# Patient Record
Sex: Male | Born: 1945 | Race: Black or African American | Hispanic: No | Marital: Married | Smoking: Former smoker
Health system: Southern US, Community
[De-identification: ages and names within clinical notes are randomized; demographics above are authoritative.]

## PROBLEM LIST (undated history)

## (undated) DIAGNOSIS — I2510 Atherosclerotic heart disease of native coronary artery without angina pectoris: Secondary | ICD-10-CM

## (undated) DIAGNOSIS — I10 Essential (primary) hypertension: Secondary | ICD-10-CM

## (undated) DIAGNOSIS — R591 Generalized enlarged lymph nodes: Secondary | ICD-10-CM

## (undated) DIAGNOSIS — H409 Unspecified glaucoma: Secondary | ICD-10-CM

## (undated) DIAGNOSIS — R599 Enlarged lymph nodes, unspecified: Secondary | ICD-10-CM

## (undated) DIAGNOSIS — N400 Enlarged prostate without lower urinary tract symptoms: Secondary | ICD-10-CM

## (undated) DIAGNOSIS — E119 Type 2 diabetes mellitus without complications: Secondary | ICD-10-CM

## (undated) DIAGNOSIS — J449 Chronic obstructive pulmonary disease, unspecified: Secondary | ICD-10-CM

## (undated) DIAGNOSIS — C801 Malignant (primary) neoplasm, unspecified: Secondary | ICD-10-CM

## (undated) DIAGNOSIS — K219 Gastro-esophageal reflux disease without esophagitis: Secondary | ICD-10-CM

## (undated) DIAGNOSIS — G4730 Sleep apnea, unspecified: Secondary | ICD-10-CM

## (undated) DIAGNOSIS — T7840XA Allergy, unspecified, initial encounter: Secondary | ICD-10-CM

## (undated) DIAGNOSIS — E785 Hyperlipidemia, unspecified: Secondary | ICD-10-CM

## (undated) DIAGNOSIS — I219 Acute myocardial infarction, unspecified: Secondary | ICD-10-CM

## (undated) DIAGNOSIS — Z972 Presence of dental prosthetic device (complete) (partial): Secondary | ICD-10-CM

## (undated) HISTORY — DX: Acute myocardial infarction, unspecified: I21.9

## (undated) HISTORY — DX: Essential (primary) hypertension: I10

## (undated) HISTORY — DX: Type 2 diabetes mellitus without complications: E11.9

## (undated) HISTORY — DX: Atherosclerotic heart disease of native coronary artery without angina pectoris: I25.10

## (undated) HISTORY — DX: Hyperlipidemia, unspecified: E78.5

## (undated) HISTORY — DX: Chronic obstructive pulmonary disease, unspecified: J44.9

## (undated) HISTORY — PX: INGUINAL HERNIA REPAIR: SUR1180

## (undated) HISTORY — PX: MULTIPLE TOOTH EXTRACTIONS: SHX2053

## (undated) HISTORY — PX: COLONOSCOPY W/ BIOPSIES AND POLYPECTOMY: SHX1376

---
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
## 1998-09-27 ENCOUNTER — Encounter: Payer: Self-pay | Admitting: Emergency Medicine

## 1998-09-27 ENCOUNTER — Inpatient Hospital Stay (HOSPITAL_COMMUNITY): Admission: EM | Admit: 1998-09-27 | Discharge: 1998-09-28 | Payer: Self-pay | Admitting: Emergency Medicine

## 1999-03-06 ENCOUNTER — Ambulatory Visit (HOSPITAL_COMMUNITY): Admission: RE | Admit: 1999-03-06 | Discharge: 1999-03-06 | Payer: Self-pay | Admitting: Gastroenterology

## 2000-09-16 HISTORY — PX: CORONARY ANGIOPLASTY: SHX604

## 2000-12-01 ENCOUNTER — Ambulatory Visit (HOSPITAL_COMMUNITY): Admission: RE | Admit: 2000-12-01 | Discharge: 2000-12-01 | Payer: Self-pay | Admitting: Gastroenterology

## 2001-02-15 ENCOUNTER — Encounter: Payer: Self-pay | Admitting: Emergency Medicine

## 2001-02-16 ENCOUNTER — Observation Stay (HOSPITAL_COMMUNITY): Admission: EM | Admit: 2001-02-16 | Discharge: 2001-02-17 | Payer: Self-pay | Admitting: Emergency Medicine

## 2001-11-08 ENCOUNTER — Encounter: Payer: Self-pay | Admitting: Family Medicine

## 2001-11-08 ENCOUNTER — Encounter: Admission: RE | Admit: 2001-11-08 | Discharge: 2001-11-08 | Payer: Self-pay | Admitting: Family Medicine

## 2001-11-15 ENCOUNTER — Encounter: Payer: Self-pay | Admitting: Family Medicine

## 2001-11-15 ENCOUNTER — Encounter: Admission: RE | Admit: 2001-11-15 | Discharge: 2001-11-15 | Payer: Self-pay | Admitting: Family Medicine

## 2001-12-08 ENCOUNTER — Encounter: Payer: Self-pay | Admitting: Neurosurgery

## 2001-12-10 ENCOUNTER — Observation Stay (HOSPITAL_COMMUNITY): Admission: RE | Admit: 2001-12-10 | Discharge: 2001-12-11 | Payer: Self-pay | Admitting: Neurosurgery

## 2001-12-10 ENCOUNTER — Encounter: Payer: Self-pay | Admitting: Neurosurgery

## 2003-01-14 ENCOUNTER — Encounter: Payer: Self-pay | Admitting: Emergency Medicine

## 2003-01-14 ENCOUNTER — Emergency Department (HOSPITAL_COMMUNITY): Admission: EM | Admit: 2003-01-14 | Discharge: 2003-01-14 | Payer: Self-pay

## 2004-01-23 ENCOUNTER — Encounter (INDEPENDENT_AMBULATORY_CARE_PROVIDER_SITE_OTHER): Payer: Self-pay | Admitting: *Deleted

## 2004-01-23 ENCOUNTER — Ambulatory Visit (HOSPITAL_COMMUNITY): Admission: RE | Admit: 2004-01-23 | Discharge: 2004-01-23 | Payer: Self-pay | Admitting: Gastroenterology

## 2005-12-11 ENCOUNTER — Encounter: Admission: RE | Admit: 2005-12-11 | Discharge: 2005-12-11 | Payer: Self-pay | Admitting: Occupational Medicine

## 2006-05-19 HISTORY — PX: OTHER SURGICAL HISTORY: SHX169

## 2006-06-20 ENCOUNTER — Inpatient Hospital Stay (HOSPITAL_COMMUNITY): Admission: EM | Admit: 2006-06-20 | Discharge: 2006-06-23 | Payer: Self-pay | Admitting: Emergency Medicine

## 2006-12-29 ENCOUNTER — Encounter: Admission: RE | Admit: 2006-12-29 | Discharge: 2006-12-29 | Payer: Self-pay | Admitting: Internal Medicine

## 2007-05-20 HISTORY — PX: HYDROCELE EXCISION / REPAIR: SUR1145

## 2007-09-06 ENCOUNTER — Ambulatory Visit (HOSPITAL_COMMUNITY): Admission: RE | Admit: 2007-09-06 | Discharge: 2007-09-06 | Payer: Self-pay | Admitting: General Surgery

## 2007-09-06 ENCOUNTER — Encounter (HOSPITAL_BASED_OUTPATIENT_CLINIC_OR_DEPARTMENT_OTHER): Payer: Self-pay | Admitting: General Surgery

## 2008-11-21 ENCOUNTER — Encounter: Admission: RE | Admit: 2008-11-21 | Discharge: 2008-11-21 | Payer: Self-pay | Admitting: Internal Medicine

## 2009-08-10 ENCOUNTER — Encounter: Admission: RE | Admit: 2009-08-10 | Discharge: 2009-08-10 | Payer: Self-pay | Admitting: Internal Medicine

## 2009-08-13 ENCOUNTER — Encounter: Admission: RE | Admit: 2009-08-13 | Discharge: 2009-08-13 | Payer: Self-pay | Admitting: Internal Medicine

## 2010-06-08 ENCOUNTER — Other Ambulatory Visit: Payer: Self-pay | Admitting: Internal Medicine

## 2010-06-08 DIAGNOSIS — I6529 Occlusion and stenosis of unspecified carotid artery: Secondary | ICD-10-CM

## 2010-08-15 ENCOUNTER — Ambulatory Visit
Admission: RE | Admit: 2010-08-15 | Discharge: 2010-08-15 | Disposition: A | Discharge status: Home / Self Care | Payer: Medicare Other | Source: Ambulatory Visit | Attending: Internal Medicine | Admitting: Internal Medicine

## 2010-08-15 DIAGNOSIS — I6529 Occlusion and stenosis of unspecified carotid artery: Secondary | ICD-10-CM

## 2010-10-01 NOTE — Op Note (Signed)
NAMEBRADON, FESTER NO.:  [id number]   MEDICAL RECORD NO.:  [id number]          PATIENT TYPE:  AMB   LOCATION:  DAY                          FACILITY:  [facility]   PHYSICIAN:  Leonie Man, M.D.   DATE OF BIRTH:  [birth year]   DATE OF PROCEDURE:  09/06/2007  DATE OF DISCHARGE:                               OPERATIVE REPORT   PREOPERATIVE DIAGNOSES:  Bilateral inguinal hernias with bilateral  hydroceles.   POSTOPERATIVE DIAGNOSES:  Bilateral inguinal hernias with bilateral  hydroceles.   PROCEDURE:  Bilateral inguinal herniorrhaphies with mesh and the  bilateral hydrocelectomy.   SURGEONS:  Leonie Man, M.D. and Lindaann Slough, M.D., surgical  team.   ANESTHESIA:  General.   NOTE:  The patient is a 65 year old retired man with a history of  coronary artery disease who underwent drug-eluting stenting in February  2008.  He delayed elective repair of his inguinal hernia and hydroceles  due to his Plavix and aspirin therapy.  He has had a recent evaluation  by Dr. Alanda Amass and has had a Cardiolite study.  He comes now to the  operating room for surgery.  The patient is well aware of the risks and  potential benefits of surgery and gives his consent to same.   PROCEDURE:  The patient is positioned supinely.  Following the induction  of satisfactory general anesthesia, the abdomen and scrotum and penis  are prepped and draped to be included in a sterile operative field.  Positive identification of the patient as Trevor Bailey and the procedure  to be done as bilateral inguinal hernias as well as bilateral  hydrocelectomies were verified.   I made an incision into the left lower abdominal crease, deepening this  through the skin and subcutaneous tissue, dissecting down the external  oblique aponeurosis.  This was opened up to the external inguinal ring  with elevation of the spermatic cord which was held with a Penrose  drain.  Dissecting along the  anteromedial aspect of the cord a large  indirect hernial sac was located.  This was dissected free and carried  up to the internal ring.  Upon opening the sac, this was noted to be  sliding hernia containing a portion of the sigmoid colon.  The portion  of sigmoid colon was dissected free and returned into the peritoneal  cavity and the sac was then closed with a running 3-0 Vicryl suture.  I  then imbricated the large direct hernia with a running suture of 2-0  Vicryl and then an onlay patch of polypropylene mesh was sewn in at the  pubic tubercle, carried up along the conjoined tendon with a 2-0 Novofil  suture up to the internal ring, again from the pubic tubercle up along  the shelving edge of Poupart's ligament up to the internal ring.  The  mesh is split so as to allow [place] normal emanation of the cord.  The tails  of the mesh were sewn down behind the cord with a 2-0 Novofil suture.  All areas of the dissection then checked for checked for hemostasis and  noted to be dry.  Sponge and instrument counts were verified and the  external oblique aponeurosis closed with a running suture of 2-0 Vicryl.  Scarpa fascia closed with a running suture of 3-0 Vicryl and the skin  closed with running 4-0 Monocryl suture.   At this point Dr. Brunilda Payor proceeded to do bilateral hydrocelectomies.  This  will be dictated in separate operative notes.   I then turned my attention to the right inguinal hernia inguinal region  after the changing of gloves at this point.  Again a transverse incision  in the lower abdominal crease was deepened through the skin and  subcutaneous tissue, dissecting down to the external oblique aponeurosis  on the right side.  Then the external oblique aponeurosis opened up  through the external inguinal ring on the right side.  The spermatic  cord was elevated.  There was no evidence of an indirect sac.  There  was, however, a very large direct inguinal hernia on this side.  The  was  repaired in the same fashion by reducing the sac with an imbricating  suture of 2-0 Vicryl followed by an onlay patch of polypropylene mesh  which was sewn in at the pubic tubercle, carried up along the conjoined  tendon with a running suture of 2-0 Novofil and again from the pubic  tubercle up along the shelving edge of Poupart's ligament to the  internal ring with mesh being split so as to allow normal emanation of  the spermatic cord.  The tails of the mesh were  sutured down at the  internal oblique muscle behind the cord.  Again all areas of dissection  were checked for hemostasis and noted to be dry.  Sponge and instrument  counts verified and the external oblique aponeurosis then closed over  the spermatic cord with a running 2-0 Vicryl suture, thus  reapproximating the external ring.  The Scarpa fascia was closed with 3-  0 Vicryl and the skin closed with 4-0 Monocryl suture running  subcuticulars.  Sterile dressings were then applied to the groin wounds  as well as collodion dressing on the scrotal wounds.  The anesthetic  reversed.  The patient removed from the operating room to the recovery  room in stable condition.  He tolerated the procedure well.      Leonie Man, M.D.  Electronically Signed     PB/MEDQ  D:  09/06/2007  T:  09/06/2007  Job:  [id number]   cc:   Leonie Man, M.D.  [address]  [address]  [place]  [place] [address]   Lindaann Slough, M.D.  Fax: [phone]   Mosetta Putt, M.D.  Fax: [phone]

## 2010-10-01 NOTE — Op Note (Signed)
NAMECHARLESTON, Trevor Bailey                ACCOUNT NO.:  [id number]   MEDICAL RECORD NO.:  [id number]          PATIENT TYPE:  AMB   LOCATION:  DAY                          FACILITY:  [facility]   PHYSICIAN:  Lindaann Slough, M.D.  DATE OF BIRTH:  19-Feb-1946   DATE OF PROCEDURE:  09/06/2007  DATE OF DISCHARGE:                               OPERATIVE REPORT   PREOPERATIVE DIAGNOSES:  1. Bilateral hydrocele.  2. Bilateral inguinal hernia.   PROCEDURE DONE:  1. Bilateral hydrocelectomy, excision of appendix testis and      epididymis, excision of bilateral epididymal cysts by Dr. Brunilda Payor.  2. Bilateral inguinal hernia repair by Dr. Lurene Shadow.   INDICATION:  The patient is a 65 year old male who had been complaining  of swelling of the left groin on and off for about a year.  He also had  been having swelling of the scrotum.  On examination, he was found to  have bilateral hydrocele and bilateral inguinal hernias.  He is  scheduled today for bilateral inguinal hernia repair and bilateral  hydrocelectomy.   Under general anesthesia, the patient was prepped and draped and placed  in the supine position.  The inguinal hernia repairs were performed by  Dr. Lurene Shadow.  For specifics, see his dictation.   A longitudinal incision was made on the left scrotum.  The incision was  carried down to the tunica vaginalis which was then incised.  About 200  mL of clear fluid was drained out from the hydrocele sac.  The appendix  testis and appendix epididymis were excised.  There was a 1-cm cyst at  the head of the left epididymis.  The cyst was excised.  Hemostasis was  secured with electrocautery.  Then the edges of the incision were  approximated with #3-0 chromic.  Then the tunica vaginalis was  imbricated with 3-0 chromic using the Lord's technique.  Then the  scrotum was closed in two layers with #3-0 chromic.  Then an incision  was made on the right scrotum.  The incision was carried down to the  tunica  vaginalis which was then incised.  The right hydrocele was  slightly smaller than the left.  One-hundred and fifty mL of clear fluid  were drained out of the hydrocele sac.  The appendix testis and appendix  epididymis were excised.  There was also a cyst at the head of the right  epididymis that was also excised.  Hemostasis was secured with  electrocautery.  The edges of the epididymal cyst excision were then  approximated with #3-0 chromic.  The tunica vaginalis was imbricated  with #3-0 chromic using the Lord's technique.  Then the incision was  closed with #3-0 chromic in two layers.   The patient tolerated the procedure well and left the OR in satisfactory  condition to post anesthesia care unit.      Lindaann Slough, M.D.  Electronically Signed    MN/MEDQ  D:  09/06/2007  T:  09/06/2007  Job:  [id number]

## 2010-10-04 NOTE — Op Note (Signed)
[facility]  Patient:    Trevor Bailey, Trevor Bailey Visit Number: [id number] MRN: [id number]          Service Type: SUR Location: 3000 [id number] Attending Physician:  Josie Saunders Dictated by:   Danae Orleans Venetia Maxon, M.D. Proc. Date: 12/10/01 Admit Date:  12/10/2001 Discharge Date: 12/11/2001                             Operative Report  PREOPERATIVE DIAGNOSIS:  Bilateral disk herniation at L4-5, left with spondylosis stenosis, degenerative disk disease, and radiculopathy.  POSTOPERATIVE DIAGNOSIS:  Bilateral disk herniation at L4-5, left with spondylosis stenosis, degenerative disk disease, and radiculopathy.  PROCEDURE:  Bilateral microdiskectomy at L4-5 on the left with microdissection.  SURGEON:  Danae Orleans. Venetia Maxon, M.D.  ASSISTANT:  Clydene Fake, M.D.  ANESTHESIA:  General endotracheal anesthesia.  ESTIMATED BLOOD LOSS:  Minimal.  COMPLICATIONS:  None.  DISPOSITION:  To recovery.  INDICATION:  Trevor Bailey is a 65 year old man with a large bilateral disk herniation at L4-5 on the left with significant L4 radiculopathy with progressive weakness.  It was elected to take him to surgery for a lateral microdiskectomy.  PROCEDURE:  Trevor Bailey was brought to the operating room.  Following the satisfactory uncomplicated induction of general endotracheal anesthesia, the patient was in the prone position on the Wilson frame.  His low back was prepped and draped in the usual sterile fashion.  AlL soft tissue and bony prominences were padded prior to prepping.  Planned incision was infiltrated with 0.25% Marcaine and 0.5% lidocaine with 1:200,000 epinephrine.  An incision was made overlying the L4 and L5 spinous processes and carried through the subcutaneous fat and the lumbodorsal fascia was incised in the left at the midline.  Subperiosteal dissection was performed exposing the L4-5 facet joint and subsequently the L4 and L5 transverse processes  on the left. An intraoperative x-ray confirmed orientation of the L4 transverse process. The microscope was brought into the field and using microdissection technique with a drill with the blue #8 bit, the inferior aspect of a very broad L4 transverse process and the pars interarticularis were thinned with the drill along with a protuberant degenerated facet joint at the superior facet of L5. After the bone removal was completed with a Kerrison rongeur, the intertransverse membrane and ligament were removed with Kerrison rongeur exposing the L4 nerve root in a far lateral position.  It appeared to be tinted cephalad by a significant disk herniation.  Using microdissection technique, the disk herniation was dissected free from the nerve root and the disk herniation was then entered with a micronerve hook.   A number of fragments of disk material were removed both medially and laterally, and using a variety of pituitary rongeurs, micropituitaries and Epstein curets.  The interspace was entered with upbiting pituitary and medial disk material was also removed.  Subsequently, a coronary dilator could easily be inserted out along the nerve root as it exited out of the spine and also more medially along its course.  It was not felt to be significantly compressed at this point.  Hemostasis was assured with bone wax along the bony edges and the wound was copiously irrigated with bacitracin and saline.  The self-retaining retractor was removed, microscope was taken out of the field, the lumbodorsal fascia was closed with 0-Vicryl sutures.  The subcutaneous tissue was reapproximated with 2-0 Vicryl interrupted, inverted sutures, and the skin  edges were reapproximated with interrupted 3-0 Vicryl subcuticular stitch. The wound was dressed with Dermabond.  The patient tolerated the procedure well and was taken to recovery in stable and satisfactory condition.  Counts were correct at the end of the  case. Dictated by:   Danae Orleans Venetia Maxon, M.D. Attending Physician:  Josie Saunders DD:  12/10/01 TD:  12/13/01 Job: [id number]

## 2010-10-04 NOTE — Discharge Summary (Signed)
Trevor Bailey, Trevor Bailey NO.:  [id number]   MEDICAL RECORD NO.:  [id number]          PATIENT TYPE:  INP   LOCATION:  6529                         FACILITY:  [facility]   PHYSICIAN:  Madaline Savage, M.D.DATE OF BIRTH:  07/19/1945   DATE OF ADMISSION:  06/19/2006  DATE OF DISCHARGE:  06/23/2006                               DISCHARGE SUMMARY   [name] is a 65 year old African-American male patient with  prior history of coronary artery disease.  He is status post an MI in  18.  He had a PCI in May of 2000 and progression of disease and he  underwent a PCI of his RCA.  He came into the emergency room with  complaints of chest pain.  He was admitted and put on IV heparin.  He  ruled out for an MI.  He underwent cardiac catheterization performed by  Dr. Chanda Busing on June 22, 2006, which revealed 75% in-stent  restenosis of diagonal stent.  He also had high grade blockage in his  proximal RCA.  His EF was 60%.  He had patent stent in zone 1.  Thus, he  underwent stenting with a Cypher DES stent to his proximal RCA.  His  residual diagonal and distal RCA disease was decided to treat medically.  His distal RCA was a 2.0-mm vessel.  Smoking cessation consult was  obtained.  The patient had actually quit smoking a week before his  admission.  He stated he would continue his smoking cessation plan.  He  was seen by Dr. Elsie Lincoln on June 23, 2006, considered stable for  discharge home.  His blood pressure was 140/73, heart rate 61,  respirations 16.  His hemoglobin was 13.6, hematocrit 40.5, platelets  189, WBC 3800, sodium 141, potassium 4.1, BUN 9, creatinine 0.87, and  glucose was 100.  He had a CT of his chest, which showed no pulmonary  embolism.   OTHER LABS:  His AST was 29, ALT was 43.  CK-MB and troponin were all  negative.  His total cholesterol is 128, triglycerides 89, HDL was 50,  LDL was 60.  TSH was 0.694.   DISCHARGE MEDICATIONS:  1.  Aspirin 325 mg a day.  2. Lipitor 80 mg a day.  3. Zetia 10 mg a day.  4. Flovent one puff a day.  5. Uroxatral 10 mg every day.  6. Altace 10 mg every day.  7. Toprol-XL 50 mg every day.  8. Colace 200 mg a day.  9. Protonix 40 mg once a day.  10.Prilosec 20 mg once a day.  11.Plavix 75 mg once a day.  He was told not to stop it.  12.Albuterol MDI use before nitroglycerin one 150 p.r.n. for chest      pain.   DISCHARGE DIAGNOSES:  1. Unstable angina.  2. Status post cardiac catheterization with progression of coronary      artery disease, and now status post stenting of a proximal right      coronary artery lesion.  He did have 75% in-stent restenosis of a  diagonal, which was a very small vessel, 30% proximal left anterior      descending, 60% lesion in his distal right coronary artery which      was also a 2.0-mm vessel distally.  These will be treated      medically.  3. Normal ejection fraction of 60%.  4. Hypertension.  5. Tobacco smoking.  He has now quit.  6. Chronic obstructive pulmonary disease, on inhalers.  7. Urinary dysfunction, on Uroxatral.  8. Hyperlipidemia.      [name], N.P.    ______________________________  Madaline Savage, M.D.    BB/MEDQ  D:  08/04/2006  T:  08/04/2006  Job:  [id number]   cc:   Mosetta Putt, M.D.  Richard A. Alanda Amass, M.D.

## 2010-10-04 NOTE — Cardiovascular Report (Signed)
[name], Bailey NO.:  [id number]   MEDICAL RECORD NO.:  [id number]          PATIENT TYPE:  INP   LOCATION:  6529                         FACILITY:  [facility]   PHYSICIAN:  Madaline Savage, M.D.DATE OF BIRTH:  [birth year]   DATE OF PROCEDURE:  06/22/2006  DATE OF DISCHARGE:                            CARDIAC CATHETERIZATION   PROCEDURES PERFORMED:  1. Selective coronary angiography by Judkins technique.  2. Retrograde left heart catheterization.  3. Left ventricular angiography.  4. Balloon angioplasty of the proximal right coronary artery.  5. Intracoronary artery stenting of the proximal right coronary      artery.  6. Placement of right percutaneous femoral artery Angio-Seal for      arteriotomy closure.   COMPLICATIONS:  None.   ENTRY SITE:  Right femoral.   DYE USED:  Omnipaque.   PATIENT PROFILE:  [name] is a very pleasant 65 year old African  American gentleman who is a medical patient of Susa Griffins, MD,  who has had previous intracoronary artery stenting to the diagonal  branch of his LAD and also to a continuation branch of his obtuse  marginal branch of the left circumflex.  He presented at this time with  chest pain and had improvement of that chest pain with nitroglycerin.  He had negative cardiac enzymes.  His EKG showed sinus rhythm and sinus  bradycardia with not much in the way of ischemic change.  The patient  was treated medically over the weekend and was brought to the  catheterization lab today for elective cardiac catheterization which was  accomplished via the right percutaneous femoral approach without  complications.   RESULTS:   PRESSURES:  The patient's left ventricular pressure was 115/90, end-  diastolic pressure 16.  The central aortic pressure was 120/80 with a  mean of 98. No aortic valve gradient by pullback technique.   ANGIOGRAPHIC RESULTS:  The patient's left main coronary artery was a  very large  vessel, fairly short in length and normal.   The left anterior descending coronary artery coursed to the cardiac apex  and gave rise to several diagonal branches and gave retrograde flow  through distal portions of the LAD to the RCA.  There is an area of  about 40% narrowing of the proximal LAD before any diagonal branches  arise.  There is a diagonal branch off the LAD which is very, very small  and has a radio-opaque stent in it, and there is in-stent restenosis in  this vessel, but is a very small vessel, probably not worthy of attempts  to reopen it.  LAD looks very good mid and distal.   Circumflex coronary artery contains a radio-opaque stent in the proximal  portions of a large obtuse marginal branch.  This obtuse marginal branch  has secondary and tertiary branches, and they appear open. The ongoing  circumflex is fairly small and patent.   The right coronary artery shows a 90% area of stenosis just beyond a  pulmonary conus branch and the sinus node branch. It is concentric. The  length of the lesion is approaching 12-14  mm in length.  There are nicks  and kinks throughout the distal portion of this vessel. That portion of  the vessel is about 2 mm in diameter.  There are two or three 60% areas  of narrowing. None of these appear to require intervention on this day.   Left ventricle shows normal contractility with ejection fraction  estimation of 60-70%.  No wall motion abnormalities and no mitral  regurgitation.   Percutaneous intervention of the proximal RCA was accomplished  uneventfully without complications.  It was performed with a Cordis  right Judkins guide catheter that was a simple Judkins right guide. A  Prowater wire was used. A predilatation balloon was used which was a  Voyager 2.5 mm.  I was then able to cross the stenotic lesion with ease  with a 2.5 Cypher stent that was 18 mm long. I then postdilated with a  Quantum Maverick 2.75 balloon. The result was  excellent, and the 90%  stenosis was reduced to 0% residual.  We did not lose any distal  branches.   FINAL DIAGNOSES:  1. Recent unstable angina without myocardial infarction in a patient      with multivessel disease and chronic tobacco use.  2. Successful intracoronary artery stenting of the proximal right      coronary artery with 90% reduced to 0% proximal vessel.   PLAN:  The patient will have a smoking cessation consultation.  He was  loaded with Plavix in the catheterization lab and will have that process  continued hereafter, and he will be a candidate for discharge in  upcoming days.           ______________________________  Madaline Savage, M.D.     WHG/MEDQ  D:  06/22/2006  T:  06/22/2006  Job:  [id number]   cc:   Gerlene Burdock A. Alanda Amass, M.D.  [facility]

## 2010-10-04 NOTE — Op Note (Signed)
Trevor Bailey. [facility]  Patient:    Trevor Bailey, Trevor Bailey Visit Number: [id number] MRN: [id number]          Service Type: OBV Location: 3700 3713 [id number] Attending Physician:  Ruta Hinds Dictated by:   Pearletha Furl Alanda Amass, M.D. Proc. Date: 02/16/01 Admit Date:  02/15/2001   CC:         Carolyne Fiscal, M.D.  Dr. Tresa Endo  CP Lab   Operative Report  PROCEDURE: 1. Retrograde central aortic catheterization. 2. Selective right coronary angiography. 3. Preoperative and postoperative nitroglycerin administration. 4. Percutaneous transluminal coronary angioplasty, high grade mid right    coronary artery stenosis in the setting of acute coronary syndrome without    myocardial infarction. 5. Left ______ stenting.  Proximal-mid right coronary artery. 6. Aggrastat bolus plus infusion, 300 Plavix p.o. in the laboratory, weight    adjusted heparin 4500 units.  The above PCI was done through the previously placed 6 French short Deg side arm sheath.  Please see preceding catheterization report of 02/16/01 for details.  INDICATIONS FOR PROCEDURE:  This 65 year old African-American gentleman is still a smoker, has known coronary disease, with prior PCI 09/27/98, was admitted with new onset unstable angina without myocardial infarction. Diagnostic studies showed progression of disease of the proximal-mid right coronary artery, felt to be the culprit lesion.  Associated disease in his mid circumflex that were noncritical, and patent stents in the mid DX1 and mid CX from 09/27/98.  DESCRIPTION OF PROCEDURE:  The right coronary artery was intubated initially with a JR4 guiding catheter, and lesion was crossed with an HTF wire.  Because of a marked bend in the right coronary artery beyond the acute margin, we were not able to get the HTF pass that, and it kept pushing the guide out, so the system was changed to a hockey stick 6 [place] guiding catheter and a choice  PT 0.014 inch Seimed guide wire.  We were able to get the choice PT wire across the lesion into the distal right coronary artery.  The guiding catheter provided fairly good backup.  The lesion was predilated with a 2.5/15 cross sail ACS balloon at 8-51.  There was residual stenosis and elastic recoil, so the system was exchanged for a 3.0/16 simetics rest II balloon expandable stent.  We were able to cross the stenosis with this, position this fluoroscopically to cover the lesion, and inflate at 13-39, and redilated to 15 atmospheres for 30 seconds.  Nitroglycerin 200 mcg was given after the balloon was pulled back.  Stenosis reduction was to 0% to -10% within the lesion with good TIMI III flow.  The mid RV branch had been previously occluded in the past and proximal to this.  There was 40 to 50% smooth tandem lesions in the distal right coronary artery beyond the acute margin with good flow to the posterior descending coronary artery and PLA which was improved post procedure.  There was residual 40% smooth narrowing of the right coronary artery proximal to the stent that was not dilated with good residual lumen. The dilatation system was removed.  Side arm sheath was flushed and secured to the skin with #1 silk suture to prevent migration.  The patient was transferred to the holding area for postoperative care.  He was given heparin 4500 units in the procedure, and Aggrastat bolus plus infusion 300 mg of Plavix.  Recommended continuing medical therapy, Aggrastat for 18 hours, continue Plavix for at least a month, possible long-term in  this patient. Discontinuation of smoking, and continued medical therapy of his associated problems as outlined.  DISCHARGE DIAGNOSES: 1. Successful percutaneous transluminal coronary angioplasty and stent, high    grade proximal-mid right coronary artery stenosis. 2. No restenosis mid circumflex and mid DX1 lesions from 09/27/98. 3. Moderate disease, ostial  DX1, proximal-mid CX, and PABJ branch of CX    unchanged. 4. Segmental wall motion abnormality with well preserved left ventricular    function, ejection fraction 55%. 5. Systemic hypertension. 6. Hyperlipidemia. 7. Chronic obstructive pulmonary disease, continued cigarette abuse.Dictated by:   Pearletha Furl Alanda Amass, M.D. Attending Physician:  Ruta Hinds DD:  02/16/01 TD:  02/17/01 Job: [id number]

## 2010-10-04 NOTE — Discharge Summary (Signed)
[place]. [facility]  Patient:    Trevor Bailey, Trevor Bailey Visit Number: [id number] MRN: [id number]          Service Type: OBV Location: 3700 3713 01 Attending Physician:  Ruta Hinds Dictated by:   Marya Fossa, P.A. Admit Date:  02/15/2001 Discharge Date: 02/17/2001   CC:         Carolyne Fiscal, M.D.   Discharge Summary  DATE OF BIRTH:  1946/05/12  [facility] RECORD NUMBER:  [id number].  ADMISSION DIAGNOSES: 1. Unstable angina, rule out myocardial infarction. 2. Known coronary artery disease. 3. Tobacco abuse. 4. Hypertension. 5. Hyperlipidemia. 6. Seasonal allergies.  DISCHARGE DIAGNOSES: 1. Unstable angina resolved, myocardial infarction ruled out with negative    enzymes, status post cardiac catheterization with intervention to the right    coronary artery. 2. Known coronary artery disease. 3. Tobacco abuse. 4. Hypertension. 5. Hyperlipidemia. 6. Seasonal allergies.  HISTORY OF PRESENT ILLNESS:  The patient is a 65 year old African-American male with known coronary disease who experienced the sudden onset of substernal chest pain while sitting in a break room at work.  There was no radiation.  It was relieved after three sublingual nitroglycerin.  The pain returned around 8:30 with soreness, fullness, and pressure in the chest.  He did not take nitroglycerin.  He went directly to the emergency room.  This was relieved initially with a GI cocktail.  He continued to have fullness through this morning.  He states this is the first episode of chest pain he has had since 2000.  This is similar to his prior symptoms, but not as severe.  His cardiac history is significant for MI in 1996.  His last cath in May 2000 revealed a normal left main, LAD with diffuse nonobstructive disease. Diagonal #1 with a 95% proximal lesion which was intervened upon.  RCA with a 50-60% proximal nonobstructive lesion, and EF  of 60%.  The circumflex had a 70-80% mid lesion which was intervened upon.  He had a negative Cardiolite in April of 2001.  In the emergency room, the patient is pain-free.  EKG non-acute.  The patient to be admitted for chest pain, rule out MI.  He is at risk for progression of disease.  The patient was placed on heparin per pharmacy protocol and sublingual nitroglycerin as he is pain-free.  Plan cardiac catheterization. Will hold off on Plavix therapy until the patient has catheter.  The patient is agreeable to this approach.  PROCEDURES:  Cardiac catheterization on February 16, 2001, by Dr. Susa Griffins with intervention to the RCA.  COMPLICATIONS:  None.  CONSULTATIONS:  None.  HOSPITAL COURSE:  The patient was admitted to [facility] in the wee hours of February 16, 2001.  He was seen in the morning by our service and felt that the patient needed cardiac catheterization.  He had already been placed on IV heparin.  Nitroglycerin as needed.  The patient remained chest pain free since admission to the hospital.  Admitting labs revealed a WBC of 6.4, hemoglobin 14.9, and platelets 227.  INR 1.1.  Sodium 137, potassium 3.3, BUN 13, creatinine 0.9.  The potassium was repleted with supplementation.  Repeat BMP on February 25, 2001, showed a potassium of 3.8, BUN 8, creatinine 0.8.  Cardiac enzymes were negative x 3.  The patient was taken to the cardiac cath lab by Dr. Alanda Amass on February 16, 2001.  This revealed a normal left main, less than 20%  diffusely irregular proximal LAD, less than 30% InStent restenosis of the diagonal #1 stent, and a 50-60% ostial diagonal #1 lesion.  The circumflex had diffuse irregularities with a patent stent in its mid distal portion.  This had less tan 20% InStent restenosis.  PABG branch had a 60% lesion.  The RCA had a 40% smooth lesion proximally and an 85% high grade mid lesion.  Distal vessel had a 40% lesion. Dr. Alanda Amass proceeded with  intervention to the mid RCA, reducing an 85% lesion to less than 10%.  The patient tolerated the procedure well.  Aggrastat was bolused and infused.  There was no problem with sheath pull.  The patient remained stable overnight.  Labs remained stable as mentioned above.  The patients groin remained stable.  There was no bruit or hematoma. The patient was felt stable for discharge.  He has been counseled on smoking cessation.  DISCHARGE MEDICATIONS:  1. Lipitor 80 mg a day.  2. Serevent two puffs as directed.  3. Enteric-coated aspirin a day.  4. Plavix 75 mg a day.  5. Wellbutrin 150 mg twice a day.  6. ______ one a day.  7. Toprol XL 50 mg a day.  8. Nitroglycerin as needed for chest pain.  9. Altace 5 mg a day. 10. Flonase and Allegra as before.  DISCHARGE ACTIVITY:  No strenuous activity, lift more than 5 pounds, or driving for two days.  DISCHARGE DIET:  A low fat, low cholesterol, and low salt diet.  DISCHARGE INSTRUCTIONS:  The patient is asked to stop smoking.  The patient is to call the office for any problems or questions.  FOLLOWUP:  A follow up appointment has been make with Abelino Derrick, P.A.-C., for March 08, 2001, at 10:00 in the morning. Dictated by:   Marya Fossa, P.A. Attending Physician:  Ruta Hinds DD:  02/17/01 TD:  02/17/01 Job: [phone] [id number]

## 2010-10-04 NOTE — Cardiovascular Report (Signed)
[place]. [facility]  Patient:    SIDDH, VANDEVENTER Visit Number: [id number] MRN: [id number]          Service Type: OBV Location: 3700 3713 01 Attending Physician:  Ruta Hinds Dictated by:   Pearletha Furl Alanda Amass, M.D. Proc. Date: 02/16/01 Admit Date:  02/15/2001 Discharge Date: 02/17/2001   CC:         Carolyne Fiscal, M.D.  CP Lab  Lennette Bihari, M.D.   Cardiac Catheterization  PROCEDURES: 1. Retrograde central aortic catheterization with selective coronary    angiography, Judkins technique, pre and post IC nitroglycerin    administration. 2. Left ventriculogram in the RAO and LAO projection, subselective LIMA and    RIMA, abdominal aortic angiogram, midstream PA projection.  DESCRIPTION OF PROCEDURE:  The patient was brought to the second floor CP lab in a post-absorptive state after 5 mg of Valium p.o. premedication.  The right groin was prepped and draped in the usual manner.  One percent Xylocaine was used for local anesthesia.  The RFA was entered with a single anterior puncture using an 18 thin wall needle and a 6-French short Daig side arm sheath was inserted without difficulty.  Catheterization was done with a 6-French 4 cm taper preformed Cordis pigtail and coronary catheter using Omnipaque dye throughout the procedure.  The patient was given 2 mg of Nubain for sedation during the procedure.  Following coronary angiography, subselective LIMA and RIMA done with the right coronary catheter.  This demonstrated patent RIMA, patent LIMA, patent brachiocephalic, and patent subclavian with antegrade vertebral flow bilaterally.  LV angiogram was done in the RAO and LAO projection at 25 cc and 14 cc per second, 20 cc and 12 cc per second respectively.  Pull back pressure of the CA showed no gradient across the aortic valve.  The catheter was removed and side arm sheath was flushed, pending review of the patients cineangiogram.   She tolerated the diagnostic procedure well.  PRESSURES:  LV 120/0, LVEP 18-20 mmHg, CA 120/70 mmHg.  There is no gradient across the aortic valve on catheter pull back.  FLUOROSCOPIC DATA:  Fluoroscopy revealed 2+ coronary calcification.  There is no intracardiac or valvular calcification.  There was coronary calcification of the right and LAD system.  Previously placed stents in the diagonal (DX1) and mid circumflex were well-visualized fluoroscopy.  LEFT VENTRICULOGRAM:  LV angiogram demonstrated hypokinesis of the mid anterolateral wall and hypokinesis of the inferior base and hypo-akinesis of the inferior basilar wall in the RAO projection.  LV was normal.  In the LAO projection, there appeared to be angiographic LVH.  There was no mitral regurgitation present.  ABDOMINAL AORTOGRAM:  Midstream PA projection revealed patent celiac and SMA axis.  Single patent renals bilaterally with no significant stenosis and only minimal infrarenal atherosclerotic disease with mild iliac tortuosity and good runoff.  CORONARY ANGIOGRAM: 1. The main left coronary artery was normal. 2. The left anterior descending artery had irregularities in the proximal    third with less than 20% narrowing.  There was good TIMI-3 flow to the    apex, and there was a large LAD. 3. The first diagonal branch had 50-60% ostial and proximal narrowing with    good flow, and a previously placed stent in the proximal - mid diagonal,    and had less than 20-30% narrowing (Sep 27, 1998); 2.5/18 plus 2.58 tandem    Tri-Star ACS stent.  The second diagonal had no significant stenosis  with    small erosions in the mid LAD. 4. The circumflex artery had irregularity in the proximal third with PABG    branch from the junction of the proximal mid third.  Just beyond the first    PABG branch was 50-60% narrowing of the circumflex before the second PABG    branch.  The second PABG branch had 60-70% narrowing at its ostia with  good    flow, and this was slightly improved from his prior angiogram.  The    previously placed stent in the mid circumflex (ABE 3.0/12) showed less than    10-20% residual stenosis, wide lumen with no significant intimal    thickening, and good flow to the large distal marginal branch and distal    bifurcating circumflex. 5. The right coronary artery was a dominant vessel.  There was diffuse disease    from the junction of the proximal third to the midportion of approximately    70-75%, and there was focal 85% stenosis in the midportion of the lesion    before the mid RCA.  The RV branch arising from the mid RCA was totally    occluded with only some faint antegrade LAD filling, and this was an old    finding.  There was 40-50% narrowing of the distal RCA that was smooth    beyond the acute margin.  There was a small PDA and PLA branch without    significant stenosis.  DISCUSSION:  The patient is a very pleasant 39 year old married father of one with three grandchildren.  He has known coronary disease and had prior intervention on Sep 27, 1998, with stenting of his first diagonal and mid circumflex (see above).  He has done well on medical therapy including Lipitor for hyperlipidemia, but unfortunately, continues to smoke a half-pack of cigarettes per day.  He is still working full-time as a Chartered certified accountant at [employer].  He was admitted on this occasion with ischemic chest pain of two days duration with negative initial enzymes and without acute EKG changes. He was felt to be high risk for progressive disease of the right coronary artery assessed by diagnostic catheterization.  Catheterization demonstrates progression of disease, particularly in the RCA. No restenosis of mid circumflex and no restenosis of the DX1 with associated disease as outlined.  There is an borderline lesion  of the circumflex  proximal to the previous placed stent which does not appear to be critical. He is a  candidate for PCI for progression of his right coronary disease in this setting with return unstable angina without myocardial infarction.  We plan to do PCI after reviewing his angiograms.  If this is successful, I would recommend follow up with Cardiolite and clinical follow up of circumflex disease long-term.  CATHETERIZATION DIAGNOSES:  1. Arteriosclerotic heart disease - unstable angina.  2. Progression of disease, mid right coronary artery.  3. No restenosis, mid circumflex.  4. No restenosis, diagonal #1.  5. Associated ostial diagonal #1, ostial posterolateral branch and mid     circumflex disease as outlined above.  6. Segmental wall motion abnormality anterolateral and inferobasal (old right     ventricular acute marginal branch occlusion).  7. Systemic hypertension.  8. Well-preserved left ventricular function with ejection fraction of     approximately 55% and left ventricular hypertrophy.  9. Hyperlipidemia. 10. Chronic obstructive pulmonary disease, continued cigarette abuse. Dictated by:   Pearletha Furl Alanda Amass, M.D. Attending Physician:  Ruta Hinds DD:  02/16/01 TD:  02/17/01 Job: [id number]

## 2010-10-04 NOTE — Op Note (Signed)
Trevor Bailey, Trevor Bailey                          ACCOUNT NO.:  [id number]   MEDICAL RECORD NO.:  [id number]                   PATIENT TYPE:  AMB   LOCATION:  [facility]                                 FACILITY:  [facility]   PHYSICIAN:  James L. Malon Kindle., M.D.          DATE OF BIRTH:  Jan 30, 1946   DATE OF PROCEDURE:  01/23/2004  DATE OF DISCHARGE:                                 OPERATIVE REPORT   PROCEDURE:  Colonoscopy and coagulation of polyp.   MEDICATIONS:  Fentanyl 75 mcg, Versed 7.5 mg IV.   INDICATIONS:  Previous history of adenomatous polyps.   DESCRIPTION OF PROCEDURE:  The procedure had been explained to the patient  and consent obtained.  With the patient in the left lateral decubitus  position, the Olympus scope was inserted and advanced.  The prep was quite  good.  We reached the cecum without difficulty, the ileocecal valve and  appendiceal orifice seen.  The scope was withdrawn and the cecum, ascending  colon, transverse colon, descending colon were seen well and were free of  polyps.  At approximately 35-40 cm from the anal verge, a 3 mm sessile polyp  was encountered and was removed with the hot biopsy forceps with  electrocautery.  There were no signs of bleeding.  The remainder of the  sigmoid colon and rectum were free of polyps.  The scope was withdrawn.  The  patient tolerated the procedure well.   ASSESSMENT:  Sigmoid colon polyp cauterized, 211.3.   PLAN:  Routine postpolypectomy instructions.  Will recommend repeating the  procedure in five years.                                               James L. Malon Kindle., M.D.    Waldron Session  D:  01/23/2004  T:  01/23/2004  Job:  [id number]   cc:   Mosetta Putt, M.D.  [address] [place]  [place] [address]  Fax: [phone]

## 2010-10-04 NOTE — Procedures (Signed)
[place]. [facility]  Patient:    Trevor Bailey, Trevor Bailey                       MRN: [id number] Proc. Date: 12/01/00 Adm. Date:  [id number] Attending:  Orland Mustard CC:         Carolyne Fiscal, M.D.   Procedure Report  PROCEDURE PERFORMED:  Colonoscopy.  ENDOSCOPIST:  Llana Aliment. Randa Evens, M.D.  MEDICATIONS USED:  Fentanyl 70 mcg, Versed 7 mg IV.  INSTRUMENT:  INDICATIONS:  Rectal bleeding in a man who has a previous history of colon polyps.  DESCRIPTION OF PROCEDURE:  The procedure had been explained to the patient and consent obtained.  With the patient in the left lateral decubitus position, the adult Olympus video colonoscope was inserted and advanced under direct visualization.  The prep was excellent and we were able to advance to the cecum without difficulty.  The ileocecal valve and appendiceal orifice were seen well.  The scope was withdrawn.  The cecum, ascending colon, hepatic flexure, transverse colon, splenic flexure, descending and sigmoid colon were seen well.  No polyps or other lesions were seen.  There was no significant diverticulosis.  Scope withdrawn in the rectum.  There were moderate internal hemorrhoids in the anal canal and no polyps in the rectum.  The scope was withdrawn.  The patient tolerated the procedure well.  ASSESSMENT: 1. Internal hemorrhoids, probably the source of his hematochezia. 2. No evidence of further colon polyps.  PLAN:  Will give a hemorrhoid sheet.  Will recommend follow-up colonoscopy in three years for screening from previous adenomatous polyps. DD:  12/01/00 TD:  12/01/00 Job: [id number]

## 2011-01-08 HISTORY — PX: NM MYOCAR PERF WALL MOTION: HXRAD629

## 2011-01-18 HISTORY — PX: CARDIAC CATHETERIZATION: SHX172

## 2011-01-24 ENCOUNTER — Ambulatory Visit (HOSPITAL_COMMUNITY)
Admission: RE | Admit: 2011-01-24 | Discharge: 2011-01-24 | Disposition: A | Discharge status: Home / Self Care | Payer: Medicare Other | Source: Ambulatory Visit | Attending: Internal Medicine | Admitting: Internal Medicine

## 2011-01-24 DIAGNOSIS — I2510 Atherosclerotic heart disease of native coronary artery without angina pectoris: Secondary | ICD-10-CM | POA: Insufficient documentation

## 2011-01-24 DIAGNOSIS — Z9861 Coronary angioplasty status: Secondary | ICD-10-CM | POA: Insufficient documentation

## 2011-01-24 LAB — GLUCOSE, CAPILLARY: Glucose-Capillary: 102 mg/dL — ABNORMAL HIGH (ref 70–99)

## 2011-01-26 NOTE — Cardiovascular Report (Signed)
NAMEMAKAIL, [name] NO.:  [id number]  MEDICAL RECORD NO.:  [id number]  LOCATION:  [facility]                         FACILITY:  [facility]  PHYSICIAN:  Trevor Tanequa Kretz, MD         DATE OF BIRTH:  1945/11/07  DATE OF PROCEDURE:  01/24/2011 DATE OF DISCHARGE:  01/24/2011                           CARDIAC CATHETERIZATION   OPERATOR:  Trevor Kitt Ledet, MD  INDICATION:  Abnormal nuclear stress test indicating inferior ST-segment depression at 3-mm at peak exercise as well as inferior and septal reversible ischemia.  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  At that time, he was known to have 60% distal RCA stenoses most likely in the PDA.  He was seen recently by Dr. Alanda Amass and sent for a nuclear stress test.  This demonstrated abnormal ischemic changes and he was therefore referred for cardiac catheterization.  He currently denies any symptoms of chest pain, worsening shortness of breath, or chest pressure with exertion.  PROCEDURE:  After informed consent was obtained, the patient was brought into cardiac catheterization lab, sterilely prepped and draped in usual fashion.  After procedural and radiation safety time-out, area around the right radial artery was draped and anesthetized with 5 mL of 1% lidocaine.  The patient was given a milligram of Versed, 25 mcg of fentanyl for moderate sedation.  The right radial artery was accessed with a 5-French with a needle and straight wire.  The 5-French right radial sheath was placed.  Subsequently, cardiac catheterization was performed with a 5-French TIG 4.0 catheter.  A total 7 mL of radial cocktail were given to prevent spasm.  There were no acute complications and estimated blood loss was less than 10 mL.  FINDINGS: 1. Left main - no  disease. 2. LAD - patent, with mild luminal irregularities.  There is a first     diagonal branch with a proximal stent that shows 90% in-stent     restenosis.  This is a small vessel and unlikely to cause symptoms. 3. Left circumflex - there is a patent mid left circumflex stent with     some mild luminal irregularities. 4. RCA - there is a patent ostial stent and distally the right     coronary artery is noted to be occluded at a high bifurcation.     There is no discernible posterolateral branch over the posterior     descending artery, is 2 mm branch and it has two discrete focal     segmental stenoses of 80-90%.  These were previously noted to be     60%.  IMPRESSION:  Distal posterior descending artery stenosis which are likely culprit for abnormal nuclear stress test and inferior ischemia. This is a small vessel in distal territory and the patient is reportedly asymptomatic.  I discussed the case with Dr. Allyson Sabal, and he felt that given his lack of symptoms and the fact that this is a smaller vessel, he would not recommend percutaneous intervention and treat medically for symptoms that  may arise.     Trevor Gabriell Daigneault, MD     CH/MEDQ  D:  01/24/2011  T:  01/25/2011  Job:  [id number]  cc:   Gerlene Burdock A. Alanda Amass, M.D.  Electronically Signed by Kirtland Bouchard. Raylee Strehl M.D. on 01/26/2011 12:29:31 PM

## 2011-02-11 LAB — PROTIME-INR: Prothrombin Time: 13

## 2011-02-11 LAB — DIFFERENTIAL
Basophils Absolute: 0
Basophils Relative: 0
Eosinophils Absolute: 0.1
Lymphocytes Relative: 28
Monocytes Relative: 8
Neutrophils Relative %: 62

## 2011-02-11 LAB — CBC
HCT: 42.1
Hemoglobin: 14.5
MCV: 95
Platelets: 200
RDW: 13.2

## 2011-02-11 LAB — COMPREHENSIVE METABOLIC PANEL
AST: 25
Alkaline Phosphatase: 69
BUN: 10
Chloride: 109
Creatinine, Ser: 0.8
Glucose, Bld: 174 — ABNORMAL HIGH
Total Bilirubin: 0.7
Total Protein: 6.4

## 2011-07-09 ENCOUNTER — Other Ambulatory Visit: Payer: Self-pay | Admitting: Gastroenterology

## 2012-01-12 HISTORY — PX: US ECHOCARDIOGRAPHY: HXRAD669

## 2012-06-22 ENCOUNTER — Other Ambulatory Visit (HOSPITAL_COMMUNITY): Payer: Self-pay | Admitting: Cardiovascular Disease

## 2012-06-22 DIAGNOSIS — I729 Aneurysm of unspecified site: Secondary | ICD-10-CM

## 2012-07-12 ENCOUNTER — Ambulatory Visit (HOSPITAL_COMMUNITY)
Admission: RE | Admit: 2012-07-12 | Discharge: 2012-07-12 | Disposition: A | Discharge status: Home / Self Care | Payer: Medicare Other | Source: Ambulatory Visit | Attending: Cardiovascular Disease | Admitting: Cardiovascular Disease

## 2012-07-12 DIAGNOSIS — I729 Aneurysm of unspecified site: Secondary | ICD-10-CM

## 2012-07-12 DIAGNOSIS — I700 Atherosclerosis of aorta: Secondary | ICD-10-CM | POA: Insufficient documentation

## 2012-07-12 NOTE — Progress Notes (Signed)
Aorta Duplex Completed. Trevor Bailey  

## 2013-01-07 ENCOUNTER — Encounter: Payer: Self-pay | Admitting: Cardiovascular Disease

## 2013-06-29 ENCOUNTER — Ambulatory Visit (INDEPENDENT_AMBULATORY_CARE_PROVIDER_SITE_OTHER): Payer: Medicare HMO | Admitting: Cardiovascular Disease

## 2013-06-29 ENCOUNTER — Encounter: Payer: Self-pay | Admitting: Cardiovascular Disease

## 2013-06-29 VITALS — BP 122/72 | HR 64 | Ht 69.0 in | Wt 210.2 lb

## 2013-06-29 DIAGNOSIS — Z9989 Dependence on other enabling machines and devices: Secondary | ICD-10-CM

## 2013-06-29 DIAGNOSIS — E782 Mixed hyperlipidemia: Secondary | ICD-10-CM

## 2013-06-29 DIAGNOSIS — G4733 Obstructive sleep apnea (adult) (pediatric): Secondary | ICD-10-CM

## 2013-06-29 DIAGNOSIS — I10 Essential (primary) hypertension: Secondary | ICD-10-CM

## 2013-06-29 DIAGNOSIS — E785 Hyperlipidemia, unspecified: Secondary | ICD-10-CM

## 2013-06-29 DIAGNOSIS — R5381 Other malaise: Secondary | ICD-10-CM

## 2013-06-29 DIAGNOSIS — R5383 Other fatigue: Secondary | ICD-10-CM

## 2013-06-29 DIAGNOSIS — Z79899 Other long term (current) drug therapy: Secondary | ICD-10-CM

## 2013-06-29 DIAGNOSIS — I2510 Atherosclerotic heart disease of native coronary artery without angina pectoris: Secondary | ICD-10-CM

## 2013-06-29 DIAGNOSIS — Z720 Tobacco use: Secondary | ICD-10-CM

## 2013-06-29 DIAGNOSIS — F17200 Nicotine dependence, unspecified, uncomplicated: Secondary | ICD-10-CM

## 2013-06-29 LAB — CBC
HEMATOCRIT: 44.5 % (ref 39.0–52.0)
Hemoglobin: 15.2 g/dL (ref 13.0–17.0)
MCH: 32.9 pg (ref 26.0–34.0)
MCHC: 34.2 g/dL (ref 30.0–36.0)
MCV: 96.3 fL (ref 78.0–100.0)
PLATELETS: 232 10*3/uL (ref 150–400)
RBC: 4.62 MIL/uL (ref 4.22–5.81)
RDW: 13.4 % (ref 11.5–15.5)
WBC: 4.7 10*3/uL (ref 4.0–10.5)

## 2013-06-29 LAB — COMPREHENSIVE METABOLIC PANEL
ALT: 38 U/L (ref 0–53)
AST: 26 U/L (ref 0–37)
Albumin: 4.1 g/dL (ref 3.5–5.2)
Alkaline Phosphatase: 61 U/L (ref 39–117)
BUN: 15 mg/dL (ref 6–23)
CO2: 30 mEq/L (ref 19–32)
Calcium: 9.7 mg/dL (ref 8.4–10.5)
Chloride: 104 mEq/L (ref 96–112)
Creat: 0.86 mg/dL (ref 0.50–1.35)
Glucose, Bld: 101 mg/dL — ABNORMAL HIGH (ref 70–99)
Potassium: 4.3 mEq/L (ref 3.5–5.3)
Sodium: 141 mEq/L (ref 135–145)
Total Bilirubin: 0.4 mg/dL (ref 0.2–1.2)
Total Protein: 6.6 g/dL (ref 6.0–8.3)

## 2013-06-29 LAB — LIPID PANEL
Cholesterol: 158 mg/dL (ref 0–200)
HDL: 49 mg/dL (ref >39)
LDL CALC: 87 mg/dL (ref 0–99)
TRIGLYCERIDES: 111 mg/dL (ref <150)
Total CHOL/HDL Ratio: 3.2 Ratio
VLDL: 22 mg/dL (ref 0–40)

## 2013-06-29 LAB — TSH: TSH: 0.621 u[IU]/mL (ref 0.350–4.500)

## 2013-06-29 NOTE — Patient Instructions (Signed)
Your physician recommends that you schedule a follow-up appointment in: 6 Months  Your physician recommends that you return for lab work CBC, CMP, TSH, FASTING LIPID

## 2013-07-04 ENCOUNTER — Encounter: Payer: Self-pay | Admitting: Cardiovascular Disease

## 2013-07-04 DIAGNOSIS — G4733 Obstructive sleep apnea (adult) (pediatric): Secondary | ICD-10-CM | POA: Insufficient documentation

## 2013-07-04 DIAGNOSIS — E785 Hyperlipidemia, unspecified: Secondary | ICD-10-CM | POA: Insufficient documentation

## 2013-07-04 DIAGNOSIS — Z720 Tobacco use: Secondary | ICD-10-CM | POA: Insufficient documentation

## 2013-07-04 DIAGNOSIS — Z9861 Coronary angioplasty status: Secondary | ICD-10-CM

## 2013-07-04 DIAGNOSIS — I2510 Atherosclerotic heart disease of native coronary artery without angina pectoris: Secondary | ICD-10-CM | POA: Insufficient documentation

## 2013-07-04 DIAGNOSIS — I10 Essential (primary) hypertension: Secondary | ICD-10-CM | POA: Insufficient documentation

## 2013-07-04 DIAGNOSIS — Z9989 Dependence on other enabling machines and devices: Secondary | ICD-10-CM

## 2013-07-04 NOTE — Progress Notes (Signed)
Patient ID: Trevor Bailey, male   DOB: 04/14/1946, 68 y.o.   MRN: [id number]     PATIENT PROFILE:  Trevor Bailey is a 68 year old African American male who is a former patient of Dr. Rollene Fare. The patient presents to the office today to establish cardiology care with me after Dr. Lowella Fairy retirement from his solo practice.   HPI:  Trevor Bailey has a history of hypertension, type 2 diabetes mellitus, coronary obstructive disease, and hyperlipidemia. In May of 2000 he underwent midcircumflex and proximal diagonal 2 PTCA and stenting.In 2002, he underwent non-DES stenting of the circumflex coronary artery as well as his right coronary artery in its midportion. In 2008, he underwent DES stenting of the proximal right current artery. His last catheterization in September 2012 showed segmental disease of the distal RCA with a patent RCA stent, a patent circumflex stent and the small diagonal vessel with 90% in-stent restenosis that was felt to be a very small vessel and medical therapy was recommended. Socially, he has done well without recurrent anginal symptoms. He did have an echo Doppler study in August 2013 which showed mild concentric LVH with grade 1 diastolic dysfunction. There is mild mitral annular calcification with mild MR, mild to moderate TR. His last nuclear perfusion study was in 2012 which was mildly abnormal and led to the cardiac catheterization.  The patient does have obstructive sleep apnea and has been on CPAP therapy for 5 years. He has smoked for approximately 50 years. He sees Dr. Delfina Redwood for primary care.   Past Medical History  Diagnosis Date  . Hypertension   . Diabetes   . Coronary disease   . Hyperlipidemia     Past Surgical History  Procedure Laterality Date  . Coronary angioplasty  may 2002    Non-DES stenting of his circumflex, non-DES stenting of the RCA  . Stents  2008    proximal RCA, DES for progession of disease.  . Cardiac catheterization  01/2011   When there was segmental stenosis of the distal RCA, patent PCA stent, patent circumflex stent, and a patent small diagonal with 90% ISR.   Marland Kitchen Hydrocele excision / repair  2009    by Dr Janice Norrie  . Inguinal hernia repair Bilateral     Dr Bubba Camp    Not on File  Current Outpatient Prescriptions  Medication Sig Dispense Refill  . clopidogrel (PLAVIX) 75 MG tablet Take 1 tablet by mouth daily.      . CRESTOR 40 MG tablet Take 1 tablet by mouth daily.      . fluticasone (FLONASE) 50 MCG/ACT nasal spray Place 1 spray into both nostrils as needed.      . hydrochlorothiazide (HYDRODIURIL) 25 MG tablet Take 1 tablet by mouth daily.      . metFORMIN (GLUCOPHAGE) 1000 MG tablet Take 1 tablet by mouth 2 (two) times daily.      . metoprolol tartrate (LOPRESSOR) 25 MG tablet Take 1 tablet by mouth 2 (two) times daily.      Marland Kitchen NITROSTAT 0.4 MG SL tablet       . ramipril (ALTACE) 10 MG capsule Take 1 capsule by mouth 2 (two) times daily.      Marland Kitchen ZETIA 10 MG tablet Take 1 tablet by mouth daily.       No current facility-administered medications for this visit.    Social history is notable that he is married has one child and 4 grandchildren. He is retired for 6 years from [employer]. He has a 50 year history of tobacco use and currently has used electronic cigarettes.  Family History  Problem Relation Age of Onset  . Heart attack Mother   . Diabetes Father   . Cancer Sister   . Stroke Father     ROS is negative for fever chills or night sweats. He denies change in weight. There are no changes in vision or hearing. He is unaware of lymphadenopathy. He denies PND orthopnea. He denies wheezing increased cough or sputum production. There is no presyncope or syncope. He denies recurrent chest tightness. He denies nausea vomiting or diarrhea. There is no blood in stool or urine. He denies claudication. He denies myalgias. He does have obstructive sleep apnea and has been on CPAP for 5 years. He is diabetic for  2 years. He denies cold or heat intolerance at times there is mild leg swelling. Other comprehensive 14 point system review is negative.  PE BP 122/72  Pulse 64  Ht 5\' 9"  (1.753 m)  Wt 210 lb 3.2 oz (95.346 kg)  BMI 31.03 kg/m2 General: Alert, oriented, no distress.  Skin: normal turgor, no rashes HEENT: Normocephalic, atraumatic. Pupils round and reactive; sclera anicteric; extraocular muscles intact; Fundi ** Nose without nasal septal hypertrophy Mouth/Parynx benign; Mallinpatti scale 3 Neck: No JVD, no carotid bruits; normal carotid upstroke Lungs: clear to ausculatation and percussion; no wheezing or rales Chest wall: without tenderness to palpitation Heart: RRR, s1 s2 normal 1/6 systolic murmur along the left sternal border. No diastolic murmur rubs thrills or heaves. Abdomen: soft, nontender; no hepatosplenomehaly, BS+; abdominal aorta nontender and not dilated by palpation. Back: no CVA tenderness Pulses 2+ Extremities: no clubbinbg cyanosis or edema, Homan's sign negative  Neurologic: grossly nonfocal; Cranial nerves grossly wnl Psychologic: Normal mood and affect    ECG (independently read by me): Normal sinus rhythm at 64 beats per minute. No significant ST changes. Mild first-degree AV block with a PR interval of 212 ms.  LABS:  BMET    Component Value Date/Time   NA 143 08/31/2007 0945   K 3.7 08/31/2007 0945   CL 109 08/31/2007 0945   CO2 26 08/31/2007 0945   GLUCOSE 174* 08/31/2007 0945   BUN 10 08/31/2007 0945   CREATININE 0.80 08/31/2007 0945   CALCIUM 9.5 08/31/2007 0945   GFRNONAA >60 08/31/2007 0945   GFRAA  Value: >60        The eGFR has been calculated using the MDRD equation. This calculation has not been validated in all clinical 08/31/2007 0945     Hepatic Function Panel     Component Value Date/Time   PROT 6.4 08/31/2007 0945   ALBUMIN 3.3* 08/31/2007 0945   AST 25 08/31/2007 0945   ALT 28 08/31/2007 0945   ALKPHOS 69 08/31/2007 0945   BILITOT 0.7  08/31/2007 0945     CBC    Component Value Date/Time   WBC 4.8 08/31/2007 0945   RBC 4.43 08/31/2007 0945   HGB 14.5 08/31/2007 0945   HCT 42.1 08/31/2007 0945   PLT 200 08/31/2007 0945   MCV 95.0 08/31/2007 0945   MCHC 34.5 08/31/2007 0945   RDW 13.2 08/31/2007 0945   LYMPHSABS 1.3 08/31/2007 0945   MONOABS 0.4 08/31/2007 0945   EOSABS 0.1 08/31/2007 0945   BASOSABS 0.0 08/31/2007 0945     BNP No results found for this basename: probnp    Lipid Panel  No results found for this basename: chol, trig, hdl, cholhdl, vldl, ldlcalc  RADIOLOGY: No results found.   ASSESSMENT AND PLAN: My impression is that Trevor Bailey is a 68 year old African American male who has a 15 year history of documented cardiotropic disease and has undergone prior intervention to his diagonal, circumflex, and right coronary artery at several sites. His last cardiac catheterization revealed patent stents in the RCA and circumflex but he did have 90% in-stent restenosis of a small diagonal vessel for which medical therapy was recommended. Presently, his blood pressure is well-controlled on metoprolol tartrate 25 mg twice a day along with HCTZ 25 mg. He is on Plavix 75 mg and baby aspirin 81 mg for 2 and a platelet therapy. He also is on Zetia 10 mg and Crestor 20 mg for hyperlipidemia. He does have obstructive sleep apnea and has been 100% compliant with his CPAP use. I have recommended laboratory be checked in the fasting state consisting of a CBC, comprehensive metabolic panel, TSH, and lipid studies. I will contact him when the results are available and if necessary adjustments will be made to his medical regimen. As long as he remains stable, I will see him in 6 months for cardiology reevaluation.   Troy Sine, MD, [facility] 07/04/2013 4:47 PM

## 2013-07-13 ENCOUNTER — Telehealth: Payer: Self-pay | Admitting: *Deleted

## 2013-07-13 NOTE — Telephone Encounter (Signed)
Left message labs good.

## 2013-07-13 NOTE — Telephone Encounter (Signed)
Message copied by Lauralee Evener on Wed Jul 13, 2013  4:33 PM ------      Message from: Shelva Majestic A      Created: Sun Jul 10, 2013  9:35 AM       Labs good ------

## 2013-07-27 ENCOUNTER — Other Ambulatory Visit (HOSPITAL_COMMUNITY): Payer: Self-pay | Admitting: Internal Medicine

## 2013-07-27 DIAGNOSIS — J441 Chronic obstructive pulmonary disease with (acute) exacerbation: Secondary | ICD-10-CM

## 2013-07-27 LAB — PULMONARY FUNCTION TEST
DL/VA % PRED: 97 %
DL/VA: 4.48 ml/min/mmHg/L
DLCO unc % pred: 74 %
DLCO unc: 24.18 ml/min/mmHg
FEF 25-75 POST: 2.46 L/s
FEF 25-75 Pre: 1.17 L/sec
FEF2575-%CHANGE-POST: 109 %
FEF2575-%PRED-PRE: 46 %
FEF2575-%Pred-Post: 96 %
FEV1-%Change-Post: 16 %
FEV1-%Pred-Post: 85 %
FEV1-%Pred-Pre: 72 %
FEV1-POST: 2.49 L
FEV1-PRE: 2.13 L
FEV1FVC-%Change-Post: -1 %
FEV1FVC-%Pred-Pre: 90 %
FEV6-%CHANGE-POST: 19 %
FEV6-%Pred-Post: 98 %
FEV6-%Pred-Pre: 82 %
FEV6-PRE: 3.04 L
FEV6-Post: 3.63 L
FEV6FVC-%Change-Post: 0 %
FEV6FVC-%Pred-Post: 103 %
FEV6FVC-%Pred-Pre: 103 %
FVC-%Change-Post: 19 %
FVC-%PRED-POST: 94 %
FVC-%Pred-Pre: 79 %
FVC-Post: 3.65 L
FVC-Pre: 3.07 L
POST FEV1/FVC RATIO: 68 %
PRE FEV1/FVC RATIO: 70 %
Post FEV6/FVC ratio: 99 %
Pre FEV6/FVC Ratio: 99 %
RV % pred: 82 %
RV: 2 L
TLC % pred: 78 %
TLC: 5.52 L

## 2013-07-28 ENCOUNTER — Other Ambulatory Visit (HOSPITAL_COMMUNITY): Payer: Self-pay | Admitting: Cardiology

## 2013-07-28 ENCOUNTER — Ambulatory Visit (HOSPITAL_COMMUNITY): Payer: Medicare HMO | Attending: Cardiovascular Disease | Admitting: *Deleted

## 2013-07-28 DIAGNOSIS — I739 Peripheral vascular disease, unspecified: Secondary | ICD-10-CM

## 2013-07-28 DIAGNOSIS — R0989 Other specified symptoms and signs involving the circulatory and respiratory systems: Secondary | ICD-10-CM | POA: Insufficient documentation

## 2013-07-28 DIAGNOSIS — E1159 Type 2 diabetes mellitus with other circulatory complications: Secondary | ICD-10-CM

## 2013-07-28 NOTE — Progress Notes (Signed)
Arterial doppler and arterial duplex complete.

## 2013-08-04 ENCOUNTER — Encounter (INDEPENDENT_AMBULATORY_CARE_PROVIDER_SITE_OTHER): Payer: Self-pay

## 2013-08-04 ENCOUNTER — Ambulatory Visit (HOSPITAL_COMMUNITY)
Admission: RE | Admit: 2013-08-04 | Discharge: 2013-08-04 | Disposition: A | Discharge status: Home / Self Care | Payer: Medicare HMO | Source: Ambulatory Visit | Attending: Internal Medicine | Admitting: Internal Medicine

## 2013-08-04 DIAGNOSIS — J4489 Other specified chronic obstructive pulmonary disease: Secondary | ICD-10-CM | POA: Insufficient documentation

## 2013-08-04 DIAGNOSIS — J449 Chronic obstructive pulmonary disease, unspecified: Secondary | ICD-10-CM | POA: Insufficient documentation

## 2013-08-04 MED ORDER — ALBUTEROL SULFATE (2.5 MG/3ML) 0.083% IN NEBU
2.5000 mg | INHALATION_SOLUTION | Freq: Once | RESPIRATORY_TRACT | Status: AC
  Administered 2013-08-04: 2.5 mg via RESPIRATORY_TRACT

## 2013-09-07 ENCOUNTER — Encounter: Payer: Self-pay | Admitting: Vascular Surgery

## 2013-09-08 ENCOUNTER — Ambulatory Visit (INDEPENDENT_AMBULATORY_CARE_PROVIDER_SITE_OTHER): Payer: Commercial Managed Care - HMO | Admitting: Vascular Surgery

## 2013-09-08 ENCOUNTER — Encounter: Payer: Self-pay | Admitting: Vascular Surgery

## 2013-09-08 ENCOUNTER — Encounter (INDEPENDENT_AMBULATORY_CARE_PROVIDER_SITE_OTHER): Payer: Self-pay

## 2013-09-08 VITALS — BP 136/80 | HR 63 | Ht 69.0 in | Wt 211.0 lb

## 2013-09-08 DIAGNOSIS — I70219 Atherosclerosis of native arteries of extremities with intermittent claudication, unspecified extremity: Secondary | ICD-10-CM

## 2013-09-08 NOTE — Progress Notes (Signed)
[facility] [place] HISTORY AND PHYSICAL   History of Present Illness:  Patient is a 68 y.o. year old male who presents for evaluation of bilateral leg pain left greater than right. He states that he develops pain after walking only a few steps. This does improve somewhat after walking long distances. He has fairly continuous pain in his anterior thighs. This is been present for several years. It has been slowly worsening. He currently smokes 1 pack of cigarettes per day. He is trying to quit. He has been tobacco free for 2 days.  He denies rest pain. He denies nonhealing wounds.  Other medical problems include coronary artery disease (myocardial infarction 15 years ago currently has 3 coronary stents and is on Plavix), hypertension, diabetes, hyperlipidemia, COPD, sleep apnea.  These are all currently stable. His cardiology issues are followed by Dr. Claiborne Billings.  Past Medical History  Diagnosis Date  . Hypertension   . Diabetes   . Coronary disease   . Hyperlipidemia   . COPD (chronic obstructive pulmonary disease)   . Myocardial infarction     Past Surgical History  Procedure Laterality Date  . Coronary angioplasty  may 2002    Non-DES stenting of his circumflex, non-DES stenting of the RCA  . Stents  2008    proximal RCA, DES for progession of disease.  . Cardiac catheterization  01/2011    When there was segmental stenosis of the distal RCA, patent PCA stent, patent circumflex stent, and a patent small diagonal with 90% ISR.   Marland Kitchen Hydrocele excision / repair  2009    by Dr Janice Norrie  . Inguinal hernia repair Bilateral     Dr Bubba Camp    Social History History  Substance Use Topics  . Smoking status: Current Every Day Smoker -- 0.50 packs/day for 50 years    Types: Cigarettes  . Smokeless tobacco: Never Used     Comment: pt states that he is cutting back on smoking  . Alcohol Use: Yes     Comment: moderately    Family History Family History  Problem Relation Age  of Onset  . Heart attack Mother   . Heart disease Mother   . Diabetes Father   . Stroke Father   . Hypertension Father   . Cancer Sister   . Hyperlipidemia Sister   . Hypertension Sister     Allergies  No Known Allergies   Current Outpatient Prescriptions  Medication Sig Dispense Refill  . albuterol (PROVENTIL HFA;VENTOLIN HFA) 108 (90 BASE) MCG/ACT inhaler Inhale 1 puff into the lungs every 6 (six) hours as needed for wheezing or shortness of breath.      Marland Kitchen aspirin 81 MG tablet Take 81 mg by mouth daily.      . clopidogrel (PLAVIX) 75 MG tablet Take 1 tablet by mouth daily.      . CRESTOR 40 MG tablet Take 1 tablet by mouth daily.      . fluticasone (FLONASE) 50 MCG/ACT nasal spray Place 1 spray into both nostrils as needed.      . hydrochlorothiazide (HYDRODIURIL) 25 MG tablet Take 1 tablet by mouth daily.      . metFORMIN (GLUCOPHAGE) 1000 MG tablet Take 1 tablet by mouth 2 (two) times daily.      . metoprolol tartrate (LOPRESSOR) 25 MG tablet Take 1 tablet by mouth 2 (two) times daily.      . ramipril (ALTACE) 10 MG capsule Take 1 capsule by mouth 2 (two) times  daily.      Marland Kitchen ZETIA 10 MG tablet Take 1 tablet by mouth daily.      Marland Kitchen NITROSTAT 0.4 MG SL tablet        No current facility-administered medications for this visit.    ROS:   General:  No weight loss, Fever, chills  HEENT: No recent headaches, no nasal bleeding, no visual changes, no sore throat  Neurologic: No dizziness, blackouts, seizures. No recent symptoms of stroke or mini- stroke. No recent episodes of slurred speech, or temporary blindness.  Cardiac: No recent episodes of chest pain/pressure, no shortness of breath at rest.  No shortness of breath with exertion.  Denies history of atrial fibrillation or irregular heartbeat  Vascular: No history of rest pain in feet.  + history of claudication.  No history of non-healing ulcer, No history of DVT   Pulmonary: No home oxygen, no productive cough, no  hemoptysis,  No asthma or wheezing  Musculoskeletal:  [ ]  Arthritis, [ ]  Low back pain,  [ ]  Joint pain  Hematologic:No history of hypercoagulable state.  No history of easy bleeding.  No history of anemia  Gastrointestinal: No hematochezia or melena,  No gastroesophageal reflux, no trouble swallowing  Urinary: [ ]  chronic Kidney disease, [ ]  on HD - [ ]  MWF or [ ]  TTHS, [ ]  Burning with urination, [ ]  Frequent urination, [ ]  Difficulty urinating;   Skin: No rashes  Psychological: No history of anxiety,  No history of depression   Physical Examination  Filed Vitals:   09/08/13 0906  BP: 136/80  Pulse: 63  Height: 5\' 9"  (1.753 m)  Weight: 211 lb (95.709 kg)  SpO2: 100%    Body mass index is 31.15 kg/(m^2).  General:  Alert and oriented, no acute distress HEENT: Normal Neck: No bruit or JVD Pulmonary: Clear to auscultation bilaterally Cardiac: Regular Rate and Rhythm without murmur Abdomen: Soft, non-tender, non-distended, no mass, no scars Skin: No rash Extremity Pulses:  2+ radial, brachial, absent femoral, dorsalis pedis, posterior tibial pulses bilaterally Musculoskeletal: No deformity or edema  Neurologic: Upper and lower extremity motor 5/5 and symmetric  DATA:  The patient had bilateral ABIs performed at [facility] on March 12. I reviewed the study today. ABIs were greater than 9 bilaterally however monophasic to biphasic diffuse waveforms were suggestive of aortoiliac occlusive disease. There was minimal plaque in the runoff bilaterally.  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 Risks benefits possible complications and procedure details were explained the patient today including but not limited to bleeding infection vessel injury contrast reaction. He understands and agrees to proceed.  Ruta Hinds, MD [facility] [place] Office: [phone] Pager: [phone]

## 2013-09-09 ENCOUNTER — Other Ambulatory Visit: Payer: Self-pay | Admitting: *Deleted

## 2013-09-12 ENCOUNTER — Encounter (HOSPITAL_COMMUNITY): Payer: Self-pay | Admitting: Pharmacy Technician

## 2013-09-15 MED ORDER — SODIUM CHLORIDE 0.9 % IV SOLN
INTRAVENOUS | Status: DC
  Administered 2013-09-16: 06:00:00 via INTRAVENOUS

## 2013-09-16 ENCOUNTER — Other Ambulatory Visit: Payer: Self-pay | Admitting: *Deleted

## 2013-09-16 ENCOUNTER — Ambulatory Visit (HOSPITAL_COMMUNITY)
Admission: RE | Admit: 2013-09-16 | Discharge: 2013-09-16 | Disposition: A | Discharge status: Home / Self Care | Payer: Medicare HMO | Source: Ambulatory Visit | Attending: Vascular Surgery | Admitting: Vascular Surgery

## 2013-09-16 ENCOUNTER — Encounter (HOSPITAL_COMMUNITY)
Admission: RE | Disposition: A | Discharge status: Home / Self Care | Payer: Self-pay | Source: Ambulatory Visit | Attending: Vascular Surgery

## 2013-09-16 DIAGNOSIS — Z7982 Long term (current) use of aspirin: Secondary | ICD-10-CM | POA: Insufficient documentation

## 2013-09-16 DIAGNOSIS — I252 Old myocardial infarction: Secondary | ICD-10-CM | POA: Insufficient documentation

## 2013-09-16 DIAGNOSIS — I70219 Atherosclerosis of native arteries of extremities with intermittent claudication, unspecified extremity: Secondary | ICD-10-CM

## 2013-09-16 DIAGNOSIS — I2510 Atherosclerotic heart disease of native coronary artery without angina pectoris: Secondary | ICD-10-CM | POA: Insufficient documentation

## 2013-09-16 DIAGNOSIS — E119 Type 2 diabetes mellitus without complications: Secondary | ICD-10-CM | POA: Insufficient documentation

## 2013-09-16 DIAGNOSIS — R29898 Other symptoms and signs involving the musculoskeletal system: Secondary | ICD-10-CM

## 2013-09-16 DIAGNOSIS — E785 Hyperlipidemia, unspecified: Secondary | ICD-10-CM | POA: Insufficient documentation

## 2013-09-16 DIAGNOSIS — F17200 Nicotine dependence, unspecified, uncomplicated: Secondary | ICD-10-CM | POA: Insufficient documentation

## 2013-09-16 DIAGNOSIS — I10 Essential (primary) hypertension: Secondary | ICD-10-CM | POA: Insufficient documentation

## 2013-09-16 DIAGNOSIS — Z9861 Coronary angioplasty status: Secondary | ICD-10-CM | POA: Insufficient documentation

## 2013-09-16 DIAGNOSIS — I7092 Chronic total occlusion of artery of the extremities: Secondary | ICD-10-CM

## 2013-09-16 DIAGNOSIS — J449 Chronic obstructive pulmonary disease, unspecified: Secondary | ICD-10-CM | POA: Insufficient documentation

## 2013-09-16 DIAGNOSIS — Z7902 Long term (current) use of antithrombotics/antiplatelets: Secondary | ICD-10-CM | POA: Insufficient documentation

## 2013-09-16 DIAGNOSIS — G4730 Sleep apnea, unspecified: Secondary | ICD-10-CM | POA: Insufficient documentation

## 2013-09-16 DIAGNOSIS — J4489 Other specified chronic obstructive pulmonary disease: Secondary | ICD-10-CM | POA: Insufficient documentation

## 2013-09-16 HISTORY — PX: ABDOMINAL AORTAGRAM: SHX5454

## 2013-09-16 LAB — POCT I-STAT, CHEM 8
BUN: 9 mg/dL (ref 6–23)
Calcium, Ion: 1.23 mmol/L (ref 1.13–1.30)
Chloride: 100 mEq/L (ref 96–112)
Creatinine, Ser: 0.9 mg/dL (ref 0.50–1.35)
Glucose, Bld: 101 mg/dL — ABNORMAL HIGH (ref 70–99)
HEMATOCRIT: 44 % (ref 39.0–52.0)
Hemoglobin: 15 g/dL (ref 13.0–17.0)
POTASSIUM: 3.7 meq/L (ref 3.7–5.3)
Sodium: 143 mEq/L (ref 137–147)
TCO2: 25 mmol/L (ref 0–100)

## 2013-09-16 LAB — GLUCOSE, CAPILLARY: GLUCOSE-CAPILLARY: 97 mg/dL (ref 70–99)

## 2013-09-16 SURGERY — ABDOMINAL AORTAGRAM
Anesthesia: LOCAL | Laterality: Left

## 2013-09-16 MED ORDER — LIDOCAINE HCL (PF) 1 % IJ SOLN
INTRAMUSCULAR | Status: AC
  Filled 2013-09-16: qty 30

## 2013-09-16 MED ORDER — SODIUM CHLORIDE 0.45 % IV SOLN
INTRAVENOUS | Status: DC
  Administered 2013-09-16: 09:00:00 via INTRAVENOUS

## 2013-09-16 MED ORDER — HEPARIN (PORCINE) IN NACL 2-0.9 UNIT/ML-% IJ SOLN
INTRAMUSCULAR | Status: AC
  Filled 2013-09-16: qty 1000

## 2013-09-16 NOTE — Discharge Instructions (Signed)
Angiography, Care After  Refer to this sheet in the next few weeks. These instructions provide you with information on caring for yourself after your procedure. Your health care provider may also give you more specific instructions. Your treatment has been planned according to current medical practices, but problems sometimes occur. Call your health care provider if you have any problems or questions after your procedure.  WHAT TO EXPECT AFTER THE PROCEDURE After your procedure, it is typical to have the following sensations:  Minor discomfort or tenderness and a small bump at the catheter insertion site. The bump should usually decrease in size and tenderness within 1 to 2 weeks.  Any bruising will usually fade within 2 to 4 weeks. HOME CARE INSTRUCTIONS   You may need to keep taking blood thinners if they were prescribed for you. Only take over-the-counter or prescription medicines for pain, fever, or discomfort as directed by your health care provider.  Do not apply powder or lotion to the site.  Do not sit in a bathtub, swimming pool, or whirlpool for 5 to 7 days.  You may shower 24 hours after the procedure. Remove the bandage (dressing) and gently wash the site with plain soap and water. Gently pat the site dry.  Inspect the site at least twice daily.  Limit your activity for the first 24 hours. Do not bend, squat, or lift anything over 10 lb (9 kg) or as directed by your health care provider.  Do not drive home if you are discharged the day of the procedure. Have someone else drive you. Follow instructions about when you can drive or return to work. SEEK MEDICAL CARE IF:  You get lightheaded when standing up.  You have drainage (other than a small amount of blood on the dressing).  You have chills.  You have a fever.  You have redness, warmth, swelling, or pain at the insertion site. SEEK IMMEDIATE MEDICAL CARE IF:   You develop chest pain or shortness of breath, feel  faint, or pass out.  You have bleeding, swelling larger than a walnut, or drainage from the catheter insertion site.  You develop pain, discoloration, coldness, or severe bruising in the leg or arm that held the catheter.  You have heavy bleeding from the site. If this happens, hold pressure on the site. MAKE SURE YOU:  Understand these instructions.  Will watch your condition.  Will get help right away if you are not doing well or get worse. Document Released: 11/21/2004 Document Revised: 01/05/2013 Document Reviewed: 09/27/2012 [facility] Patient Information 2014 [place].

## 2013-09-16 NOTE — Interval H&P Note (Signed)
History and Physical Interval Note:  09/16/2013 8:05 AM  Trevor Bailey  has presented today for surgery, with the diagnosis of PVD with claudification  The various methods of treatment have been discussed with the patient and family. After consideration of risks, benefits and other options for treatment, the patient has consented to  Procedure(s): ABDOMINAL AORTAGRAM (Left) as a surgical intervention .  The patient's history has been reviewed, patient examined, no change in status, stable for surgery.  I have reviewed the patient's chart and labs.  Questions were answered to the patient's satisfaction.     Elam Dutch

## 2013-09-16 NOTE — Op Note (Signed)
Procedure: Abdominal aortogram with bilateral lower extremity runoff  Preoperative diagnosis: Claudication  Postoperative diagnosis: Same  Anesthesia: Local  Operative findings: #1 70% stenosis right common femoral artery                                #2 multilevel areas of 30-50% stenosis superficial femoral artery bilaterally left greater than right                                 #3 two-vessel runoff peroneal and posterior tibial artery bilaterally   Operative details: After informed consent, the patient was taken to the [facility] lab. The patient was placed in supine position on the Angio table. Both groins were prepped and draped in the usual sterile fashion. Local anesthesia was infiltrated over the right common femoral artery. Ultrasound was used to identify the right common femoral artery. An introducer needle was used to cannulate the right common femoral artery without difficulty. I initially tried to advance an 035 Versed or wire through the needle but this would not pass easily due to posterior plaque. A micropuncture wire was brought up in the operative field and this easily advanced through the needle. The needle was removed and the micropuncture sheath placed over the guidewire. The micropuncture wire was then removed and the sheath exchanged for an 035 versacore wire. An 55 Versacore wire was then threaded and the abdominal aorta under fluoroscopic guidance. A 5 French sheath was placed over the guidewire the right common femoral artery. The 5 French sheath was thoroughly flushed with heparinized saline. A 5 French pigtail catheter was then placed over the guidewire and advanced into the abdominal aorta under fluoroscopic guidance. Abdominal aortogram was obtained in an AP projection. The left and right renal arteries are patent. The infrarenal abdominal aorta is irregular but no significant flow-limiting stenosis. There is some calcification of the wall. Next patent the catheter was pulled  down distal above the aortic bifurcation oblique views of the pelvis were obtained with magnification. The left and right common iliac arteries are patent. The left and right external and internal iliac arteries are patent. There is a 70% stenosis of the mid to proximal right common femoral artery. There is a high takeoff of the profunda on the left side although this is still below the inguinal ligament.  Next bilateral extremity runoff views were obtained through the pigtail catheter. In the left lower extremity, there are multiple areas of 30-50% stenosis of the left superficial femoral artery diffusely throughout. These are exophytic calcified areas. The below-knee popliteal artery is normal in appearance. The anterior tibial artery is occluded. The peroneal and posterior tibial arteries are the fire runoff vessels to the left foot. The peroneal artery is small.  In the right lower extremity, there are similar findings in the superficial femoral artery but not as diffuse as on the left. The runoff is similar in appearance to the left with two-vessel runoff via the peroneal and posterior tibial arteries and occlusion of the anterior tibial artery.  At this point the pigtail catheter was removed over a guidewire. The 5 French sheath was thoroughly flushed with heparinized saline and left in place to be pulled in the holding are in. The patient tolerated the procedure well and there were no complications. The patient was taken to the holding area in stable condition.  Operative management:  The patient will be placed on a walking program for now to see if he can get some improvement in his walking distance. His symptoms are consistent more with pseudo-claudication as his pain occurs primarily with beginning walking and improves as he walks more rather than the pain being increased with walking as you would usually see with peripheral arterial disease. We will also refer him to physical therapy. He will  return for followup in 3 months time with repeat ABIs with exercise.  Ruta Hinds, MD [facility] [place] Office: [phone] Pager: [phone]

## 2013-09-16 NOTE — H&P (View-Only) (Signed)
[facility] [place] HISTORY AND PHYSICAL   History of Present Illness:  Patient is a 68 y.o. year old male who presents for evaluation of bilateral leg pain left greater than right. He states that he develops pain after walking only a few steps. This does improve somewhat after walking long distances. He has fairly continuous pain in his anterior thighs. This is been present for several years. It has been slowly worsening. He currently smokes 1 pack of cigarettes per day. He is trying to quit. He has been tobacco free for 2 days.  He denies rest pain. He denies nonhealing wounds.  Other medical problems include coronary artery disease (myocardial infarction 15 years ago currently has 3 coronary stents and is on Plavix), hypertension, diabetes, hyperlipidemia, COPD, sleep apnea.  These are all currently stable. His cardiology issues are followed by Dr. Claiborne Billings.  Past Medical History  Diagnosis Date  . Hypertension   . Diabetes   . Coronary disease   . Hyperlipidemia   . COPD (chronic obstructive pulmonary disease)   . Myocardial infarction     Past Surgical History  Procedure Laterality Date  . Coronary angioplasty  may 2002    Non-DES stenting of his circumflex, non-DES stenting of the RCA  . Stents  2008    proximal RCA, DES for progession of disease.  . Cardiac catheterization  01/2011    When there was segmental stenosis of the distal RCA, patent PCA stent, patent circumflex stent, and a patent small diagonal with 90% ISR.   Marland Kitchen Hydrocele excision / repair  2009    by Dr Janice Norrie  . Inguinal hernia repair Bilateral     Dr Bubba Camp    Social History History  Substance Use Topics  . Smoking status: Current Every Day Smoker -- 0.50 packs/day for 50 years    Types: Cigarettes  . Smokeless tobacco: Never Used     Comment: pt states that he is cutting back on smoking  . Alcohol Use: Yes     Comment: moderately    Family History Family History  Problem Relation Age  of Onset  . Heart attack Mother   . Heart disease Mother   . Diabetes Father   . Stroke Father   . Hypertension Father   . Cancer Sister   . Hyperlipidemia Sister   . Hypertension Sister     Allergies  No Known Allergies   Current Outpatient Prescriptions  Medication Sig Dispense Refill  . albuterol (PROVENTIL HFA;VENTOLIN HFA) 108 (90 BASE) MCG/ACT inhaler Inhale 1 puff into the lungs every 6 (six) hours as needed for wheezing or shortness of breath.      Marland Kitchen aspirin 81 MG tablet Take 81 mg by mouth daily.      . clopidogrel (PLAVIX) 75 MG tablet Take 1 tablet by mouth daily.      . CRESTOR 40 MG tablet Take 1 tablet by mouth daily.      . fluticasone (FLONASE) 50 MCG/ACT nasal spray Place 1 spray into both nostrils as needed.      . hydrochlorothiazide (HYDRODIURIL) 25 MG tablet Take 1 tablet by mouth daily.      . metFORMIN (GLUCOPHAGE) 1000 MG tablet Take 1 tablet by mouth 2 (two) times daily.      . metoprolol tartrate (LOPRESSOR) 25 MG tablet Take 1 tablet by mouth 2 (two) times daily.      . ramipril (ALTACE) 10 MG capsule Take 1 capsule by mouth 2 (two) times  daily.      Marland Kitchen ZETIA 10 MG tablet Take 1 tablet by mouth daily.      Marland Kitchen NITROSTAT 0.4 MG SL tablet        No current facility-administered medications for this visit.    ROS:   General:  No weight loss, Fever, chills  HEENT: No recent headaches, no nasal bleeding, no visual changes, no sore throat  Neurologic: No dizziness, blackouts, seizures. No recent symptoms of stroke or mini- stroke. No recent episodes of slurred speech, or temporary blindness.  Cardiac: No recent episodes of chest pain/pressure, no shortness of breath at rest.  No shortness of breath with exertion.  Denies history of atrial fibrillation or irregular heartbeat  Vascular: No history of rest pain in feet.  + history of claudication.  No history of non-healing ulcer, No history of DVT   Pulmonary: No home oxygen, no productive cough, no  hemoptysis,  No asthma or wheezing  Musculoskeletal:  [ ]  Arthritis, [ ]  Low back pain,  [ ]  Joint pain  Hematologic:No history of hypercoagulable state.  No history of easy bleeding.  No history of anemia  Gastrointestinal: No hematochezia or melena,  No gastroesophageal reflux, no trouble swallowing  Urinary: [ ]  chronic Kidney disease, [ ]  on HD - [ ]  MWF or [ ]  TTHS, [ ]  Burning with urination, [ ]  Frequent urination, [ ]  Difficulty urinating;   Skin: No rashes  Psychological: No history of anxiety,  No history of depression   Physical Examination  Filed Vitals:   09/08/13 0906  BP: 136/80  Pulse: 63  Height: 5\' 9"  (1.753 m)  Weight: 211 lb (95.709 kg)  SpO2: 100%    Body mass index is 31.15 kg/(m^2).  General:  Alert and oriented, no acute distress HEENT: Normal Neck: No bruit or JVD Pulmonary: Clear to auscultation bilaterally Cardiac: Regular Rate and Rhythm without murmur Abdomen: Soft, non-tender, non-distended, no mass, no scars Skin: No rash Extremity Pulses:  2+ radial, brachial, absent femoral, dorsalis pedis, posterior tibial pulses bilaterally Musculoskeletal: No deformity or edema  Neurologic: Upper and lower extremity motor 5/5 and symmetric  DATA:  The patient had bilateral ABIs performed at [facility] on March 12. I reviewed the study today. ABIs were greater than 9 bilaterally however monophasic to biphasic diffuse waveforms were suggestive of aortoiliac occlusive disease. There was minimal plaque in the runoff bilaterally.  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 Risks benefits possible complications and procedure details were explained the patient today including but not limited to bleeding infection vessel injury contrast reaction. He understands and agrees to proceed.  Ruta Hinds, MD [facility] [place] Office: [phone] Pager: [phone]

## 2013-09-20 ENCOUNTER — Telehealth: Payer: Self-pay | Admitting: Vascular Surgery

## 2013-09-20 NOTE — Telephone Encounter (Addendum)
Message copied by Gena Fray on Tue Sep 20, 2013  3:50 PM ------      Message from: Mena Goes      Created: Fri Sep 16, 2013  9:57 AM      Regarding: Schedule                   ----- Message -----         From: Elam Dutch, MD         Sent: 09/16/2013   8:55 AM           To: [facility] Charge Pool            Aortogram with bilateral runoff, ultrasound            He needs evaluation by physical therapy for his back and walking program for his PAD            He needs to see me back in 3 months with ABI with exercise            Juanda Crumble ------  09/20/13: spoke with pt re appt with [facility] and also talked to him about referral to Physical Therapy that is pending. dpm

## 2013-10-04 ENCOUNTER — Ambulatory Visit: Payer: Medicare HMO | Attending: Vascular Surgery

## 2013-10-04 DIAGNOSIS — M25669 Stiffness of unspecified knee, not elsewhere classified: Secondary | ICD-10-CM | POA: Insufficient documentation

## 2013-10-04 DIAGNOSIS — R29898 Other symptoms and signs involving the musculoskeletal system: Secondary | ICD-10-CM | POA: Insufficient documentation

## 2013-10-04 DIAGNOSIS — IMO0001 Reserved for inherently not codable concepts without codable children: Secondary | ICD-10-CM | POA: Diagnosis not present

## 2013-10-04 DIAGNOSIS — M5126 Other intervertebral disc displacement, lumbar region: Secondary | ICD-10-CM | POA: Diagnosis not present

## 2013-10-04 DIAGNOSIS — R262 Difficulty in walking, not elsewhere classified: Secondary | ICD-10-CM | POA: Insufficient documentation

## 2013-10-12 ENCOUNTER — Ambulatory Visit: Payer: Medicare HMO

## 2013-10-12 DIAGNOSIS — IMO0001 Reserved for inherently not codable concepts without codable children: Secondary | ICD-10-CM | POA: Diagnosis not present

## 2013-10-13 ENCOUNTER — Ambulatory Visit: Payer: Medicare HMO

## 2013-10-13 DIAGNOSIS — IMO0001 Reserved for inherently not codable concepts without codable children: Secondary | ICD-10-CM | POA: Diagnosis not present

## 2013-10-18 ENCOUNTER — Ambulatory Visit: Payer: Medicare HMO | Attending: Vascular Surgery

## 2013-10-18 DIAGNOSIS — M5126 Other intervertebral disc displacement, lumbar region: Secondary | ICD-10-CM | POA: Insufficient documentation

## 2013-10-18 DIAGNOSIS — M25669 Stiffness of unspecified knee, not elsewhere classified: Secondary | ICD-10-CM | POA: Insufficient documentation

## 2013-10-18 DIAGNOSIS — IMO0001 Reserved for inherently not codable concepts without codable children: Secondary | ICD-10-CM | POA: Insufficient documentation

## 2013-10-18 DIAGNOSIS — R262 Difficulty in walking, not elsewhere classified: Secondary | ICD-10-CM | POA: Diagnosis not present

## 2013-10-18 DIAGNOSIS — R29898 Other symptoms and signs involving the musculoskeletal system: Secondary | ICD-10-CM | POA: Insufficient documentation

## 2013-10-20 ENCOUNTER — Ambulatory Visit: Payer: Medicare HMO

## 2013-10-20 DIAGNOSIS — IMO0001 Reserved for inherently not codable concepts without codable children: Secondary | ICD-10-CM | POA: Diagnosis not present

## 2013-10-25 ENCOUNTER — Ambulatory Visit: Payer: Medicare HMO | Admitting: Physical Therapy

## 2013-10-25 DIAGNOSIS — IMO0001 Reserved for inherently not codable concepts without codable children: Secondary | ICD-10-CM | POA: Diagnosis not present

## 2013-10-27 ENCOUNTER — Ambulatory Visit: Payer: Medicare HMO | Admitting: Physical Therapy

## 2013-10-27 DIAGNOSIS — IMO0001 Reserved for inherently not codable concepts without codable children: Secondary | ICD-10-CM | POA: Diagnosis not present

## 2013-11-01 ENCOUNTER — Ambulatory Visit: Payer: Medicare HMO | Admitting: Physical Therapy

## 2013-11-01 DIAGNOSIS — IMO0001 Reserved for inherently not codable concepts without codable children: Secondary | ICD-10-CM | POA: Diagnosis not present

## 2013-11-03 ENCOUNTER — Ambulatory Visit: Payer: Medicare HMO

## 2013-11-03 DIAGNOSIS — IMO0001 Reserved for inherently not codable concepts without codable children: Secondary | ICD-10-CM | POA: Diagnosis not present

## 2013-12-22 ENCOUNTER — Encounter: Payer: Commercial Managed Care - HMO | Admitting: Vascular Surgery

## 2013-12-22 ENCOUNTER — Encounter (HOSPITAL_COMMUNITY): Payer: Commercial Managed Care - HMO

## 2014-01-10 ENCOUNTER — Encounter: Payer: Self-pay | Admitting: Cardiovascular Disease

## 2014-01-10 ENCOUNTER — Telehealth: Payer: Self-pay | Admitting: Cardiovascular Disease

## 2014-01-16 NOTE — Telephone Encounter (Signed)
Closed encounter °

## 2014-01-24 ENCOUNTER — Ambulatory Visit: Payer: Commercial Managed Care - HMO | Admitting: Cardiovascular Disease

## 2014-02-09 ENCOUNTER — Encounter: Payer: Self-pay | Admitting: Cardiovascular Disease

## 2014-02-09 ENCOUNTER — Ambulatory Visit (INDEPENDENT_AMBULATORY_CARE_PROVIDER_SITE_OTHER): Payer: Commercial Managed Care - HMO | Admitting: Cardiovascular Disease

## 2014-02-09 VITALS — BP 122/78 | HR 92 | Ht 69.0 in | Wt 205.6 lb

## 2014-02-09 DIAGNOSIS — G4733 Obstructive sleep apnea (adult) (pediatric): Secondary | ICD-10-CM

## 2014-02-09 DIAGNOSIS — I2510 Atherosclerotic heart disease of native coronary artery without angina pectoris: Secondary | ICD-10-CM

## 2014-02-09 DIAGNOSIS — E782 Mixed hyperlipidemia: Secondary | ICD-10-CM

## 2014-02-09 DIAGNOSIS — Z9989 Dependence on other enabling machines and devices: Secondary | ICD-10-CM

## 2014-02-09 DIAGNOSIS — I491 Atrial premature depolarization: Secondary | ICD-10-CM

## 2014-02-09 DIAGNOSIS — I70219 Atherosclerosis of native arteries of extremities with intermittent claudication, unspecified extremity: Secondary | ICD-10-CM

## 2014-02-09 DIAGNOSIS — I10 Essential (primary) hypertension: Secondary | ICD-10-CM

## 2014-02-09 DIAGNOSIS — E785 Hyperlipidemia, unspecified: Secondary | ICD-10-CM

## 2014-02-09 MED ORDER — ATORVASTATIN CALCIUM 40 MG PO TABS: 40.0000 mg | ORAL_TABLET | Freq: Every day | ORAL | Status: DC

## 2014-02-09 MED ORDER — METOPROLOL TARTRATE 50 MG PO TABS: 50.0000 mg | ORAL_TABLET | Freq: Two times a day (BID) | ORAL | Status: DC

## 2014-02-09 NOTE — Patient Instructions (Signed)
Increase Metoprolol to 50 mg twice a day   Stop Crestor   Start Atorvastatin 40 mg every afternoon after dinner   Fasting lab in 3 months Take lab order to [place] wants you to follow-up in: 6 months. You will receive a reminder letter in the mail two months in advance. If you don't receive a letter, please call our office to schedule the follow-up appointment.

## 2014-02-09 NOTE — Progress Notes (Signed)
Patient ID: Trevor Bailey, male   DOB: [birth year], 68 y.o.   MRN: [id number]     HPI:  Trevor Bailey is a 68 year old African American male who is a former patient of Dr. Rollene Fare.  He is status cardiology care with me in February 2015.  He now presents for seven-month followup evaluation.  HPI:  Trevor Bailey has a history of hypertension, type 2 diabetes mellitus, coronary obstructive disease, and hyperlipidemia. In May of 200 he underwent mid circumflex and proximal diagonal 2 PTCA and stenting. In 2002, he underwent non-DES stenting of the circumflex coronary artery as well as his RCAin its midportion. In 2008, he underwent DES stenting of the proximal RCA. His last catheterization in September 2012 showed segmental disease of the distal RCA with a patent RCA stent, a patent circumflex stent and the small diagonal vessel with 90% in-stent restenosis that was felt to be a very small vessel and medical therapy was recommended. Subsequently he has done well without recurrent anginal symptoms. An echo Doppler study in August 2013 showed mild concentric LVH with grade 1 diastolic dysfunction. There was mild mitral annular calcification with mild MR, mild to moderate TR. His last nuclear perfusion study in 2012 was mildly abnormal and led to the cardiac catheterization.  The patient does have obstructive sleep apnea and has been on CPAP therapy for 5 years. He has smoked for approximately 50 years. He sees Dr. Delfina Redwood for primary care.   Since I last saw him in February 2011, he underwent lower extremity angiographic study by Dr. Juanda Crumble [employer] and was found to have 70% stenosis of the right common femoral artery, and multilevel areas of 30-50% stenoses of the superficial femoral artery bilaterally, left greater than right, and 2 vessel runoff with peroneal and posterior tibial artery bilaterally.  He underwent physical therapy and has noticed significant improvement in his prior claudication.  He denies  recent chest tightness.  He is on unaware of any heart rhythm irregularity.  He denies presyncope or syncope.  He has been on ZEI and Crestor 20 mg for hypokalemia, but is concerned about the cost and was questioning about switching to a different statin.  He denies recently swelling.  He is diabetic on metformin.  He has been on aspirin and Plavix.  Past Medical History  Diagnosis Date  . Hypertension   . Diabetes   . Coronary disease   . Hyperlipidemia   . COPD (chronic obstructive pulmonary disease)   . Myocardial infarction     Past Surgical History  Procedure Laterality Date  . Coronary angioplasty  may 2002    Non-DES stenting of his circumflex, non-DES stenting of the RCA  . Stents  2008    proximal RCA, DES for progession of disease.  . Cardiac catheterization  01/2011    When there was segmental stenosis of the distal RCA, patent PCA stent, patent circumflex stent, and a patent small diagonal with 90% ISR.   Marland Kitchen Hydrocele excision / repair  2009    by Dr Janice Norrie  . Inguinal hernia repair Bilateral     Dr Bubba Camp    No Known Allergies  Current Outpatient Prescriptions  Medication Sig Dispense Refill  . aspirin EC 81 MG tablet Take 81 mg by mouth daily.      . clopidogrel (PLAVIX) 75 MG tablet Take 1 tablet by mouth daily.      . fluticasone (FLONASE) 50 MCG/ACT nasal spray Place 1 spray into both nostrils daily as  needed for rhinitis.       . Fluticasone Furoate-Vilanterol (BREO ELLIPTA) 100-25 MCG/INH AEPB Inhale 1 puff into the lungs daily.      . hydrochlorothiazide (HYDRODIURIL) 25 MG tablet Take 1 tablet by mouth daily.      . metFORMIN (GLUCOPHAGE) 1000 MG tablet Take 1 tablet by mouth 2 (two) times daily.      . metoprolol tartrate (LOPRESSOR) 25 MG tablet Take 1 tablet by mouth 2 (two) times daily.      Marland Kitchen NITROSTAT 0.4 MG SL tablet Place 0.4 mg under the tongue every 5 (five) minutes as needed for chest pain.       . ramipril (ALTACE) 10 MG capsule Take 1 capsule by  mouth 2 (two) times daily.      . rosuvastatin (CRESTOR) 20 MG tablet Take 20 mg by mouth daily.      Marland Kitchen ZETIA 10 MG tablet Take 1 tablet by mouth daily.       No current facility-administered medications for this visit.    Social history is notable that he is married has one child and 4 grandchildren. He is retired for 6 years from [employer]. He has a 50 year history of tobacco use and currently has used electronic cigarettes.  Family History  Problem Relation Age of Onset  . Heart attack Mother   . Heart disease Mother   . Diabetes Father   . Stroke Father   . Hypertension Father   . Cancer Sister   . Hyperlipidemia Sister   . Hypertension Sister    ROS General: Negative; No fevers, chills, or night sweats;  HEENT: Negative; No changes in vision or hearing, sinus congestion, difficulty swallowing Pulmonary: Negative; No cough, wheezing, shortness of breath, hemoptysis Cardiovascular: Negative; No chest pain, presyncope, syncope, palpitations Improvement in recent claudication symptoms. GI: Negative; No nausea, vomiting, diarrhea, or abdominal pain GU: Negative; No dysuria, hematuria, or difficulty voiding Musculoskeletal: Negative; no myalgias, joint pain, or weakness Hematologic/Oncology: Negative; no easy bruising, bleeding Endocrine: Positive for diabetes mellitus; no heat/cold intolerance;  Neuro: Negative; no changes in balance, headaches Skin: Negative; No rashes or skin lesions Psychiatric: Negative; No behavioral problems, depression Sleep: Positive for obstructive sleep apnea on CPAP for greater than 5 years No snoring, daytime sleepiness, hypersomnolence, bruxism, restless legs, hypnogognic hallucinations, no cataplexy Other comprehensive 14 point system review is negative.   PE BP 122/78  Pulse 92  Ht $R'5\' 9"'wm$  (1.753 m)  Wt 205 lb 9.6 oz (93.26 kg)  BMI 30.35 kg/m2 General: Alert, oriented, no distress.  Skin: normal turgor, no rashes HEENT: Normocephalic,  atraumatic. Pupils round and reactive; sclera anicteric; extraocular muscles intact;  Nose without nasal septal hypertrophy Mouth/Parynx benign; Mallinpatti scale 3 Neck: No JVD, no carotid bruits; normal carotid upstroke Lungs: clear to ausculatation and percussion; no wheezing or rales Chest wall: without tenderness to palpitation Heart: RRR, s1 s2 normal 1/6 systolic murmur along the left sternal border. No diastolic murmur rubs thrills or heaves. Abdomen: soft, nontender; no hepatosplenomehaly, BS+; abdominal aorta nontender and not dilated by palpation. Back: no CVA tenderness Pulses 2+ Extremities: no clubbinbg cyanosis or edema, Homan's sign negative  Neurologic: grossly nonfocal; Cranial nerves grossly wnl Psychologic: Normal mood and affect  ECG (independently read by me): Sinus rhythm with marked sinus arrhythmia with PACs with low atrial etiology and PACs with retrograde P waves.  ECG (independently read by me): Normal sinus rhythm at 64 beats per minute. No significant ST changes. Mild first-degree AV  block with a PR interval of 212 ms.  LABS:  BMET    Component Value Date/Time   NA 143 09/16/2013 0612   K 3.7 09/16/2013 0612   CL 100 09/16/2013 0612   CO2 30 06/29/2013 1153   GLUCOSE 101* 09/16/2013 0612   BUN 9 09/16/2013 0612   CREATININE 0.90 09/16/2013 0612   CREATININE 0.86 06/29/2013 1153   CALCIUM 9.7 06/29/2013 1153   GFRNONAA >60 08/31/2007 0945   GFRAA  Value: >60        The eGFR has been calculated using the MDRD equation. This calculation has not been validated in all clinical 08/31/2007 0945     Hepatic Function Panel     Component Value Date/Time   PROT 6.6 06/29/2013 1153   ALBUMIN 4.1 06/29/2013 1153   AST 26 06/29/2013 1153   ALT 38 06/29/2013 1153   ALKPHOS 61 06/29/2013 1153   BILITOT 0.4 06/29/2013 1153     CBC    Component Value Date/Time   WBC 4.7 06/29/2013 1153   RBC 4.62 06/29/2013 1153   HGB 15.0 09/16/2013 0612   HCT 44.0 09/16/2013 0612   PLT 232  06/29/2013 1153   MCV 96.3 06/29/2013 1153   MCH 32.9 06/29/2013 1153   MCHC 34.2 06/29/2013 1153   RDW 13.4 06/29/2013 1153   LYMPHSABS 1.3 08/31/2007 0945   MONOABS 0.4 08/31/2007 0945   EOSABS 0.1 08/31/2007 0945   BASOSABS 0.0 08/31/2007 0945     BNP No results found for this basename: probnp    Lipid Panel     Component Value Date/Time   CHOL 158 06/29/2013 1153     RADIOLOGY: No results found.   ASSESSMENT AND PLAN:  Trevor Bailey is a 68 year old African American male who has a 15 year history of documented CAD and has undergone prior intervention to his diagonal, circumflex, and right coronary artery at several sites. His last cardiac catheterization revealed patent stents in the RCA and circumflex but he did have 90% in-stent restenosis of a small diagonal vessel for which medical therapy was recommended.  Presently, his blood pressure is controlled on current therapy including ramipril 10 mg, metoprolol tarttrate 25 mg twice a day and hydrochlorothiazide.  However, his resting pulse is in the 90s and he is having marked sinus arrhythmia with frequent PACs.  I am recommending further titration of his metoprolol tartrate 50 mg twice a day.  He does have obstructive sleep apnea continues to use CPAP with 100% compliance.  He was recently found to have peripheral vascular disease and etiology of his claudication and has noticed some improvement after physical therapy.  He is on dual antiplatelet therapy with aspirin and Plavix.  He does have hyperlipidemia on Zetia and Crestor, but has been concerned about the cost.  I will change his Crestor 20 mg to generic atorvastatin 40 mg, which hopefully will reduce his financial concerns.  Followup laboratory will be obtained in 2-3 months.  I will see him in 6 months for cardiology reevaluation or sooner problems arise.   Troy Sine, MD, [facility] 02/09/2014 2:52 PM

## 2014-02-22 ENCOUNTER — Encounter: Payer: Self-pay | Admitting: Vascular Surgery

## 2014-02-23 ENCOUNTER — Ambulatory Visit (HOSPITAL_COMMUNITY)
Admission: RE | Admit: 2014-02-23 | Discharge: 2014-02-23 | Disposition: A | Discharge status: Home / Self Care | Payer: Medicare HMO | Source: Ambulatory Visit | Attending: Vascular Surgery | Admitting: Vascular Surgery

## 2014-02-23 ENCOUNTER — Encounter: Payer: Self-pay | Admitting: Vascular Surgery

## 2014-02-23 ENCOUNTER — Ambulatory Visit (INDEPENDENT_AMBULATORY_CARE_PROVIDER_SITE_OTHER): Payer: Commercial Managed Care - HMO | Admitting: Vascular Surgery

## 2014-02-23 ENCOUNTER — Other Ambulatory Visit: Payer: Self-pay | Admitting: Vascular Surgery

## 2014-02-23 VITALS — BP 144/88 | HR 71 | Ht 66.0 in | Wt 206.1 lb

## 2014-02-23 DIAGNOSIS — I10 Essential (primary) hypertension: Secondary | ICD-10-CM | POA: Insufficient documentation

## 2014-02-23 DIAGNOSIS — I7092 Chronic total occlusion of artery of the extremities: Secondary | ICD-10-CM

## 2014-02-23 DIAGNOSIS — E785 Hyperlipidemia, unspecified: Secondary | ICD-10-CM | POA: Insufficient documentation

## 2014-02-23 DIAGNOSIS — E119 Type 2 diabetes mellitus without complications: Secondary | ICD-10-CM | POA: Diagnosis not present

## 2014-02-23 DIAGNOSIS — I739 Peripheral vascular disease, unspecified: Secondary | ICD-10-CM

## 2014-02-23 NOTE — Progress Notes (Signed)
    Established Claudication  History of Present Illness  Trevor Bailey is a 68 y.o. (May 08, 1946) male who presents for three month follow up for claudication. He is s/p aortogram with bilateral runoff on 09/16/2013 which revealed two vessel runoff via the peroneal and posterior tibial artery bilaterally, 70% stenosis of the right common femoral artery and multilevel areas of 30-50% stenosis of the SFA bilaterally, left> right. It was recommended that he start a walking program, physical therapy and to follow up in three months.   Today, he has no complaints regarding his walking ability. He is able to do his yard work without significant pain. He continues to smoke.   The patient's PMH, PSH, SH, FamHx, Med, and Allergies are unchanged from 09/16/2013.   On ROS today: he denies any non healing wounds of his feet.   Physical Examination  Filed Vitals:   02/23/14 1619  BP: 144/88  Pulse: 71  Height: 5\' 6"  (1.676 m)  Weight: 206 lb 1.6 oz (93.486 kg)  SpO2: 96%   Body mass index is 33.28 kg/(m^2).  General: A&O x 3, WDWN male in NAD Vascular: non palpable pedal pulses bilaterally, no ischemic changes, no wounds Musculolskeletal: no edema, no atrophy, moving all extremities equally  Non-Invasive Vascular Imaging Excericse ABI (Date: 02/23/2014)  Bilateral resting ABIs: 1.05 R; 1.02 L  Post exercise: 0.61 R, 0.65 L; bilateral recovery time > 5 minutes.   Medical Decision Making  Trevor Bailey is a 68 y.o. male who presents with:  bilateral leg intermittent claudication without evidence of critical limb ischemia. He does have a significant decrease in his ABIs post exercise, but the patient is content with his current exercise ability. We will not pursue any further interventions at this time. He was counseled on smoking cessation and foot/skin hygiene. He will follow up in 6 months with ABIs.   Virgina Jock, PA-C [facility] [place] Office:  [phone] Pager: [phone]  02/23/2014, 4:32 PM   This patient was seen in conjunction with Dr. Oneida Alar.   History exam details as above. Greater than 3 minutes they spent regarding smoking cessation counseling. The patient's current satisfied with his current walking distance. He'll continue walking 30 minutes daily. He will try refrain from smoking. He'll followup in 6 months time with repeat ABIs.  Ruta Hinds, MD [facility] [place] Office: [phone] Pager: [phone]

## 2014-02-24 ENCOUNTER — Other Ambulatory Visit: Payer: Self-pay | Admitting: *Deleted

## 2014-02-24 DIAGNOSIS — I739 Peripheral vascular disease, unspecified: Secondary | ICD-10-CM

## 2014-04-18 ENCOUNTER — Other Ambulatory Visit: Payer: Self-pay | Admitting: Internal Medicine

## 2014-04-18 ENCOUNTER — Other Ambulatory Visit: Payer: Self-pay | Admitting: Nurse Practitioner

## 2014-04-18 ENCOUNTER — Ambulatory Visit
Admission: RE | Admit: 2014-04-18 | Discharge: 2014-04-18 | Disposition: A | Discharge status: Home / Self Care | Payer: Commercial Managed Care - HMO | Source: Ambulatory Visit | Attending: Nurse Practitioner | Admitting: Nurse Practitioner

## 2014-04-18 DIAGNOSIS — R591 Generalized enlarged lymph nodes: Secondary | ICD-10-CM

## 2014-04-21 ENCOUNTER — Ambulatory Visit
Admission: RE | Admit: 2014-04-21 | Discharge: 2014-04-21 | Disposition: A | Discharge status: Home / Self Care | Payer: Commercial Managed Care - HMO | Source: Ambulatory Visit | Attending: Internal Medicine | Admitting: Internal Medicine

## 2014-04-21 DIAGNOSIS — R591 Generalized enlarged lymph nodes: Secondary | ICD-10-CM

## 2014-04-21 MED ORDER — IOHEXOL 300 MG/ML  SOLN
75.0000 mL | Freq: Once | INTRAMUSCULAR | Status: AC | PRN
  Administered 2014-04-21: 75 mL via INTRAVENOUS

## 2014-04-26 ENCOUNTER — Other Ambulatory Visit: Payer: Self-pay | Admitting: Internal Medicine

## 2014-04-26 DIAGNOSIS — E041 Nontoxic single thyroid nodule: Secondary | ICD-10-CM

## 2014-04-26 DIAGNOSIS — R591 Generalized enlarged lymph nodes: Secondary | ICD-10-CM

## 2014-04-26 DIAGNOSIS — R599 Enlarged lymph nodes, unspecified: Secondary | ICD-10-CM

## 2014-04-27 ENCOUNTER — Other Ambulatory Visit: Payer: Commercial Managed Care - HMO

## 2014-04-27 ENCOUNTER — Encounter (HOSPITAL_COMMUNITY): Payer: Self-pay | Admitting: Vascular Surgery

## 2014-05-01 ENCOUNTER — Ambulatory Visit
Admission: RE | Admit: 2014-05-01 | Discharge: 2014-05-01 | Disposition: A | Discharge status: Home / Self Care | Payer: Commercial Managed Care - HMO | Source: Ambulatory Visit | Attending: Internal Medicine | Admitting: Internal Medicine

## 2014-05-01 DIAGNOSIS — E041 Nontoxic single thyroid nodule: Secondary | ICD-10-CM

## 2014-05-01 DIAGNOSIS — R599 Enlarged lymph nodes, unspecified: Secondary | ICD-10-CM

## 2014-05-01 DIAGNOSIS — R591 Generalized enlarged lymph nodes: Secondary | ICD-10-CM

## 2014-05-16 LAB — COMPREHENSIVE METABOLIC PANEL
ALK PHOS: 76 U/L (ref 39–117)
ALT: 17 U/L (ref 0–53)
AST: 16 U/L (ref 0–37)
Albumin: 3.8 g/dL (ref 3.5–5.2)
BUN: 11 mg/dL (ref 6–23)
CO2: 30 mEq/L (ref 19–32)
CREATININE: 0.94 mg/dL (ref 0.50–1.35)
Calcium: 9.7 mg/dL (ref 8.4–10.5)
Chloride: 104 mEq/L (ref 96–112)
GLUCOSE: 107 mg/dL — AB (ref 70–99)
Potassium: 4.1 mEq/L (ref 3.5–5.3)
SODIUM: 141 meq/L (ref 135–145)
Total Bilirubin: 0.5 mg/dL (ref 0.2–1.2)
Total Protein: 6.1 g/dL (ref 6.0–8.3)

## 2014-05-16 LAB — LIPID PANEL
Cholesterol: 139 mg/dL (ref 0–200)
HDL: 49 mg/dL (ref >39)
LDL CALC: 68 mg/dL (ref 0–99)
Total CHOL/HDL Ratio: 2.8 Ratio
Triglycerides: 111 mg/dL (ref <150)
VLDL: 22 mg/dL (ref 0–40)

## 2014-05-18 ENCOUNTER — Encounter: Payer: Self-pay | Admitting: *Deleted

## 2014-05-30 ENCOUNTER — Encounter: Payer: Self-pay | Admitting: *Deleted

## 2014-08-10 ENCOUNTER — Encounter: Payer: Self-pay | Admitting: *Deleted

## 2014-08-16 ENCOUNTER — Telehealth: Payer: Self-pay | Admitting: Cardiovascular Disease

## 2014-08-16 NOTE — Telephone Encounter (Signed)
Needed diagnosis code for 6 month visit with Dr. Claiborne Billings - I25.10

## 2014-08-21 ENCOUNTER — Ambulatory Visit (INDEPENDENT_AMBULATORY_CARE_PROVIDER_SITE_OTHER): Payer: Commercial Managed Care - HMO | Admitting: Cardiovascular Disease

## 2014-08-21 ENCOUNTER — Encounter: Payer: Self-pay | Admitting: Cardiovascular Disease

## 2014-08-21 VITALS — BP 112/82 | HR 67 | Ht 69.0 in | Wt 205.3 lb

## 2014-08-21 DIAGNOSIS — I739 Peripheral vascular disease, unspecified: Secondary | ICD-10-CM

## 2014-08-21 DIAGNOSIS — I2510 Atherosclerotic heart disease of native coronary artery without angina pectoris: Secondary | ICD-10-CM

## 2014-08-21 DIAGNOSIS — E785 Hyperlipidemia, unspecified: Secondary | ICD-10-CM | POA: Diagnosis not present

## 2014-08-21 DIAGNOSIS — Z9989 Dependence on other enabling machines and devices: Secondary | ICD-10-CM

## 2014-08-21 DIAGNOSIS — G4733 Obstructive sleep apnea (adult) (pediatric): Secondary | ICD-10-CM

## 2014-08-21 DIAGNOSIS — I10 Essential (primary) hypertension: Secondary | ICD-10-CM

## 2014-08-21 DIAGNOSIS — Z720 Tobacco use: Secondary | ICD-10-CM

## 2014-08-21 NOTE — Patient Instructions (Signed)
Your physician wants you to follow-up in: 1 Year. You will receive a reminder letter in the mail two months in advance. If you don't receive a letter, please call our office to schedule the follow-up appointment.  

## 2014-08-21 NOTE — Progress Notes (Signed)
Patient ID: Trevor Bailey, male   DOB: 06/11/1945, 69 y.o.   MRN: [id number]     HPI:  [name] is a 69 year old African American male who is a former patient of Dr. Rollene Fare.  I last saw him 7 months ago he presents for cardiology follow-up evaluation  [name] has a history of hypertension, type 2 diabetes mellitus, CAD, and hyperlipidemia. In May 2000 he underwent mid circumflex and proximal diagonal PTCA/non DES stenting. In 2002, he underwent non-DES stenting of the circumflex coronary artery as well as mid RCA .  In 2008, he underwent DES stenting of the proximal RCA. His last catheterization in September 2012 showed segmental disease of the distal RCA with a patent RCA stent, a patent circumflex stent and the small diagonal vessel with 90% in-stent restenosis that was felt to be a very small vessel and medical therapy was recommended. Subsequently he has done well without recurrent anginal symptoms. An echo Doppler study in August 2013 showed mild concentric LVH with grade 1 diastolic dysfunction. There was mild mitral annular calcification with mild MR, mild to moderate TR. His last nuclear perfusion study in 2012 was mildly abnormal and led to the cardiac catheterization.  The patient has obstructive sleep apnea and has been on CPAP therapy for 6 years. He has smoked for approximately 50 years. He sees Dr. Delfina Redwood for primary care.   He sees Dr.Charles Fields  for PVD and in February 2011 underwent lower extremity angiographic study  and was found to have 70% stenosis of the right common femoral artery, and multilevel areas of 30-50% stenoses of the superficial femoral artery bilaterally, left greater than right, and 2 vessel runoff with peroneal and posterior tibial artery bilaterally.  He underwent physical therapy and has noticed significant improvement in his prior claudication.  Since I last saw him, he has remained stable.  At that time, due to financial constraints, we  discontinued Crestor and started him on atorvastatin.  He is tolerated this well.  He also is on Zetia 10 mg daily.  He has continued to be on ramipril10 mg bid, metoprolol 50 mg twice a day and HCTZ 25 mg for blood pressure control.  He denies anginal symptoms.  He denies any significant claudication symptoms.  He is unaware of palpitations.  He presents for evaluation.  Past Medical History  Diagnosis Date  . Hypertension   . Diabetes   . Coronary disease   . Hyperlipidemia   . COPD (chronic obstructive pulmonary disease)   . Myocardial infarction     Past Surgical History  Procedure Laterality Date  . Coronary angioplasty  may 2002    Non-DES stenting of his circumflex, non-DES stenting of the RCA  . Stents  2008    proximal RCA, DES for progession of disease.  . Cardiac catheterization  01/2011    When there was segmental stenosis of the distal RCA, patent PCA stent, patent circumflex stent, and a patent small diagonal with 90% ISR.   Marland Kitchen Hydrocele excision / repair  2009    by Dr Janice Norrie  . Inguinal hernia repair Bilateral     Dr Bubba Camp  . Abdominal aortagram Left 09/16/2013    Procedure: ABDOMINAL Maxcine Ham;  Surgeon: Elam Dutch, MD;  Location: [facility] CATH LAB;  Service: Cardiovascular;  Laterality: Left;  . US echocardiography  01/12/2012    mild concentric LVH, borderline LA enlargement, mild to mod TR  . Nm myocar perf wall motion  01/08/2011  moderate in size and intensity area of reversible ischemia in the basal to mid inferior and septal territories. Abnormal study    No Known Allergies  Current Outpatient Prescriptions  Medication Sig Dispense Refill  . aspirin EC 81 MG tablet Take 81 mg by mouth daily.    Marland Kitchen atorvastatin (LIPITOR) 40 MG tablet Take 1 tablet (40 mg total) by mouth daily. 90 tablet 3  . CLARITIN 10 MG tablet Take 1 tablet by mouth daily.    . clopidogrel (PLAVIX) 75 MG tablet Take 1 tablet by mouth daily.    . Fluticasone Furoate-Vilanterol (BREO  ELLIPTA) 100-25 MCG/INH AEPB Inhale 1 puff into the lungs daily.    . hydrochlorothiazide (HYDRODIURIL) 25 MG tablet Take 1 tablet by mouth daily.    . metFORMIN (GLUCOPHAGE) 1000 MG tablet Take 1 tablet by mouth 2 (two) times daily.    . metoprolol (LOPRESSOR) 50 MG tablet Take 1 tablet (50 mg total) by mouth 2 (two) times daily. 180 tablet 3  . NITROSTAT 0.4 MG SL tablet Place 0.4 mg under the tongue every 5 (five) minutes as needed for chest pain.     . ramipril (ALTACE) 10 MG capsule Take 1 capsule by mouth 2 (two) times daily.    Marland Kitchen ZETIA 10 MG tablet Take 1 tablet by mouth daily.     No current facility-administered medications for this visit.    Social history is notable that he is married has one child and 4 grandchildren. He is retired. He has a 50 year history of tobacco use and currently has used electronic cigarettes.  Family History  Problem Relation Age of Onset  . Heart attack Mother   . Heart disease Mother   . Diabetes Father   . Stroke Father   . Hypertension Father   . Cancer Sister   . Hyperlipidemia Sister   . Hypertension Sister    ROS General: Negative; No fevers, chills, or night sweats;  HEENT: Negative; No changes in vision or hearing, sinus congestion, difficulty swallowing Pulmonary: Negative; No cough, wheezing, shortness of breath, hemoptysis Cardiovascular: Negative; No chest pain, presyncope, syncope, palpitations Improvement in recent claudication symptoms. GI: Negative; No nausea, vomiting, diarrhea, or abdominal pain GU: Negative; No dysuria, hematuria, or difficulty voiding Musculoskeletal: Negative; no myalgias, joint pain, or weakness Hematologic/Oncology: Negative; no easy bruising, bleeding Endocrine: Positive for diabetes mellitus; no heat/cold intolerance;  Neuro: Negative; no changes in balance, headaches Skin: Negative; No rashes or skin lesions Psychiatric: Negative; No behavioral problems, depression Sleep: Positive for obstructive  sleep apnea on CPAP. No snoring, daytime sleepiness, hypersomnolence, bruxism, restless legs, hypnogognic hallucinations, no cataplexy Other comprehensive 14 point system review is negative.   PE BP 112/82 mmHg  Pulse 67  Ht $R'5\' 9"'qt$  (1.753 m)  Wt 205 lb 4.8 oz (93.123 kg)  BMI 30.30 kg/m2 General: Alert, oriented, no distress.  Skin: normal turgor, no rashes HEENT: Normocephalic, atraumatic. Pupils round and reactive; sclera anicteric; extraocular muscles intact;  Nose without nasal septal hypertrophy Mouth/Parynx benign; Mallinpatti scale 3 Neck: No JVD, no carotid bruits; normal carotid upstroke Lungs: clear to ausculatation and percussion; no wheezing or rales Chest wall: without tenderness to palpitation Heart: RRR, s1 s2 normal 1/6 systolic murmur along the left sternal border. No diastolic murmur rubs thrills or heaves. Abdomen: soft, nontender; no hepatosplenomehaly, BS+; abdominal aorta nontender and not dilated by palpation. Back: no CVA tenderness Pulses 2+ Extremities: no clubbinbg cyanosis or edema, Homan's sign negative  Neurologic: grossly nonfocal; Cranial nerves  grossly wnl Psychologic: Normal mood and affect  ECG (independently read by me): Normal sinus rhythm at 67 bpm.  Borderline first-degree AV block with a PR interval 26 ms.  No significant ST-T changes.  September 2015 ECG (independently read by me): Sinus rhythm with marked sinus arrhythmia with PACs with low atrial etiology and PACs with retrograde P waves.  Prior ECG (independently read by me): Normal sinus rhythm at 64 beats per minute. No significant ST changes. Mild first-degree AV block with a PR interval of 212 ms.  LABS:  BMET  BMP Latest Ref Rng 05/16/2014 09/16/2013 06/29/2013  Glucose 70 - 99 mg/dL 107(H) 101(H) 101(H)  BUN 6 - 23 mg/dL $Remove'11 9 15  'XMOgZea$ Creatinine 0.50 - 1.35 mg/dL 0.94 0.90 0.86  Sodium 135 - 145 mEq/L 141 143 141  Potassium 3.5 - 5.3 mEq/L 4.1 3.7 4.3  Chloride 96 - 112 mEq/L 104 100  104  CO2 19 - 32 mEq/L 30 - 30  Calcium 8.4 - 10.5 mg/dL 9.7 - 9.7     Hepatic Function Panel   Hepatic Function Latest Ref Rng 05/16/2014 06/29/2013 08/31/2007  Total Protein 6.0 - 8.3 g/dL 6.1 6.6 6.4  Albumin 3.5 - 5.2 g/dL 3.8 4.1 3.3(L)  AST 0 - 37 U/L $Remo'16 26 25  'wehIv$ ALT 0 - 53 U/L 17 38 28  Alk Phosphatase 39 - 117 U/L 76 61 69  Total Bilirubin 0.2 - 1.2 mg/dL 0.5 0.4 0.7      CBC  CBC Latest Ref Rng 09/16/2013 06/29/2013 08/31/2007  WBC 4.0 - 10.5 K/uL - 4.7 4.8  Hemoglobin 13.0 - 17.0 g/dL 15.0 15.2 14.5  Hematocrit 39.0 - 52.0 % 44.0 44.5 42.1  Platelets 150 - 400 K/uL - 232 200     BNP No results found for: PROBNP     Lipid Panel     Component Value Date/Time   CHOL 139 05/16/2014 1115   TRIG 111 05/16/2014 1115   HDL 49 05/16/2014 1115   CHOLHDL 2.8 05/16/2014 1115   VLDL 22 05/16/2014 1115   LDLCALC 68 05/16/2014 1115     RADIOLOGY: No results found.   ASSESSMENT AND PLAN:  Mr. Strader is a 69 year old African American male who has a 16 year history of documented CAD and has undergone prior intervention to his diagonal, circumflex, and right coronary artery at several sites. His last cardiac catheterization revealed patent stents in the RCA and circumflex but he did have 90% in-stent restenosis of a small diagonal vessel for which medical therapy was recommended.  Presently, his blood pressure is controlled on current therapy including ramipril, metoprolol tartrate, and hydrochlorothiazide. When  I last saw him, his resting pulse was in the 90s and I further titrated his metoprolol to 50 mg twice a day.  His resting pulse today is in the 60s.  He denies any palpitations.  There is borderline first-degree AV block with a PR interval at 206.  He is asymptomatic with reference to this.  I reviewed laboratory from December which showed his LDL cholesterol at 68.  On combination Zetia and atorvastatin 40 mg.  Target LDL is less than 70.  Dr. Delfina Redwood had recently recheck  laboratory several days ago.  I'll try to obtain these results.  He is very mildly obese with a body mass index of 30.3.  I have suggested weight loss at least 5 pounds or greater, if possible.  We discussed exercise.  With reference to his sleep apnea, he continues to be  on CPAP therapy with 100% compliance.  He is unaware of breakthrough snoring.  His sleep is restorative.  He denies excessive daytime sleepiness.  As long as he remains stable, I will see him in one year for cardiology reevaluation or sooner if problems arise.  Time spent: 25 minutes  Troy Sine, MD, [facility] 08/21/2014 1:15 PM

## 2014-08-23 ENCOUNTER — Encounter: Payer: Self-pay | Admitting: Vascular Surgery

## 2014-08-24 ENCOUNTER — Ambulatory Visit (HOSPITAL_COMMUNITY)
Admission: RE | Admit: 2014-08-24 | Discharge: 2014-08-24 | Disposition: A | Discharge status: Home / Self Care | Payer: Commercial Managed Care - HMO | Source: Ambulatory Visit | Attending: Vascular Surgery | Admitting: Vascular Surgery

## 2014-08-24 ENCOUNTER — Encounter: Payer: Self-pay | Admitting: Vascular Surgery

## 2014-08-24 ENCOUNTER — Ambulatory Visit (INDEPENDENT_AMBULATORY_CARE_PROVIDER_SITE_OTHER): Payer: Commercial Managed Care - HMO | Admitting: Vascular Surgery

## 2014-08-24 VITALS — BP 138/90 | HR 62 | Temp 98.0°F | Resp 14 | Ht 69.0 in | Wt 203.1 lb

## 2014-08-24 DIAGNOSIS — I739 Peripheral vascular disease, unspecified: Secondary | ICD-10-CM | POA: Insufficient documentation

## 2014-08-24 DIAGNOSIS — Z48812 Encounter for surgical aftercare following surgery on the circulatory system: Secondary | ICD-10-CM | POA: Insufficient documentation

## 2014-08-24 NOTE — Addendum Note (Signed)
Addended by: Mena Goes on: 08/24/2014 02:53 PM   Modules accepted: Orders

## 2014-08-24 NOTE — Progress Notes (Signed)
    Established Claudication  History of Present Illness  Trevor Bailey is a 69 y.o. (1945/10/03) male who presents for 6 month follow up for claudication. He is s/p aortogram with bilateral runoff on 09/16/2013 which revealed two vessel runoff via the peroneal and posterior tibial artery bilaterally, 70% stenosis of the right common femoral artery and multilevel areas of 30-50% stenosis of the SFA bilaterally, left> right. Today, he has no complaints regarding his walking ability. He is able to do his yard work without significant pain. He has stopped smoking within the past 6 months.  The patient's PMH, PSH, SH, FamHx, Med, and Allergies are unchanged from 09/16/2013.   On ROS today: he denies any non healing wounds of his feet. He denies shortness of breath or chest pain.  Physical Examination    Filed Vitals:   08/24/14 1113  BP: 138/90  Pulse: 62  Temp: 98 F (36.7 C)  TempSrc: Oral  Resp: 14  Height: 5\' 9"  (1.753 m)  Weight: 203 lb 1.6 oz (92.126 kg)  SpO2: 98%    General: A&O x 3, WDWN male in NAD Vascular: 2+ right dorsalis pedis pulse absent left DP pulse, bilateral 1+ femoral pulses no ischemic changes, no wounds Musculolskeletal: no edema, no atrophy, moving all extremities equally  Non-Invasive Vascular Imaging ABIs 0.99 bilaterally  Medical Decision Making  The patient's currently satisfied with his current walking distance. He'll continue walking 30 minutes daily.  He'll followup in 1 year  with repeat ABIs.  I congratulated him today for stopping smoking.  Ruta Hinds, MD [facility] Office: [phone] Pager: [phone]

## 2015-03-21 ENCOUNTER — Other Ambulatory Visit: Payer: Self-pay | Admitting: Cardiovascular Disease

## 2015-03-28 ENCOUNTER — Other Ambulatory Visit: Payer: Self-pay

## 2015-03-28 MED ORDER — METOPROLOL TARTRATE 50 MG PO TABS: 50.0000 mg | ORAL_TABLET | Freq: Two times a day (BID) | ORAL | Status: DC

## 2015-03-28 NOTE — Telephone Encounter (Signed)
Troy Sine, MD at 08/21/2014 1:15 PM  metoprolol (LOPRESSOR) 50 MG tablet Take 1 tablet (50 mg total) by mouth 2 (two) times daily. ASSESSMENT AND PLAN: Presently, his blood pressure is controlled on current therapy including ramipril, metoprolol tartrate, and hydrochlorothiazide. When I last saw him, his resting pulse was in the 90s and I further titrated his metoprolol to 50 mg twice a day.

## 2015-08-30 ENCOUNTER — Encounter (HOSPITAL_COMMUNITY): Payer: Commercial Managed Care - HMO

## 2015-08-30 ENCOUNTER — Ambulatory Visit: Payer: Commercial Managed Care - HMO | Admitting: Family

## 2015-10-05 ENCOUNTER — Ambulatory Visit (INDEPENDENT_AMBULATORY_CARE_PROVIDER_SITE_OTHER): Payer: Medicare Other | Admitting: Cardiovascular Disease

## 2015-10-05 ENCOUNTER — Encounter: Payer: Self-pay | Admitting: Cardiovascular Disease

## 2015-10-05 VITALS — BP 139/89 | HR 67 | Ht 69.0 in | Wt 204.6 lb

## 2015-10-05 DIAGNOSIS — I2510 Atherosclerotic heart disease of native coronary artery without angina pectoris: Secondary | ICD-10-CM | POA: Diagnosis not present

## 2015-10-05 DIAGNOSIS — E785 Hyperlipidemia, unspecified: Secondary | ICD-10-CM

## 2015-10-05 DIAGNOSIS — I10 Essential (primary) hypertension: Secondary | ICD-10-CM | POA: Diagnosis not present

## 2015-10-05 DIAGNOSIS — Z9989 Dependence on other enabling machines and devices: Secondary | ICD-10-CM

## 2015-10-05 DIAGNOSIS — E1159 Type 2 diabetes mellitus with other circulatory complications: Secondary | ICD-10-CM

## 2015-10-05 DIAGNOSIS — G4733 Obstructive sleep apnea (adult) (pediatric): Secondary | ICD-10-CM

## 2015-10-05 DIAGNOSIS — Z720 Tobacco use: Secondary | ICD-10-CM

## 2015-10-05 NOTE — Patient Instructions (Signed)
Your physician wants you to follow-up in: 1 year or sooner if needed. You will receive a reminder letter in the mail two months in advance. If you don't receive a letter, please call our office to schedule the follow-up appointment.   If you need a refill on your cardiac medications before your next appointment, please call your pharmacy.   

## 2015-10-07 ENCOUNTER — Encounter: Payer: Self-pay | Admitting: Cardiovascular Disease

## 2015-10-07 DIAGNOSIS — E119 Type 2 diabetes mellitus without complications: Secondary | ICD-10-CM | POA: Insufficient documentation

## 2015-10-07 NOTE — Progress Notes (Signed)
Patient ID: Trevor Bailey, male   DOB: [birth year], 70 y.o.   MRN: [id number]    Primary M.D.: Dr. Delfina Redwood  HPI:  Trevor Bailey is a 70 year old African American male who is a former patient of Dr. Rollene Fare.  He presents for a one year evaluation.  Trevor Bailey has a history of hypertension, type 2 diabetes mellitus, CAD, and hyperlipidemia. In May 2000 he underwent mid circumflex and proximal diagonal PTCA/non DES stenting. In 2002, he underwent non-DES stenting of the circumflex coronary artery as well as mid RCA .  In 2008, he underwent DES stenting of the proximal RCA. His last catheterization in September 2012 showed segmental disease of the distal RCA with a patent RCA stent, a patent circumflex stent and the small diagonal vessel with 90% in-stent restenosis that was felt to be a very small vessel and medical therapy was recommended. Subsequently he has done well without recurrent anginal symptoms. An echo Doppler study in August 2013 showed mild concentric LVH with grade 1 diastolic dysfunction. There was mild mitral annular calcification with mild MR, mild to moderate TR. His last nuclear perfusion study in 2012 was mildly abnormal and led to the cardiac catheterization.  The patient has obstructive sleep apnea and has been on CPAP therapy for 6 years. He has smoked for approximately 50 years. He sees Dr. Delfina Redwood for primary care.   He sees Dr.Charles Fields  for PVD and in February 2011 underwent lower extremity angiographic study  and was found to have 70% stenosis of the right common femoral artery, and multilevel areas of 30-50% stenoses of the superficial femoral artery bilaterally, left greater than right, and 2 vessel runoff with peroneal and posterior tibial artery bilaterally.  He underwent physical therapy and has noticed significant improvement in his prior claudication.  Over the past year, he denies any episodes of chest pain or significant shortness of breath.  He continues to use  CPAP for his obstructive sleep apnea.  His blood pressure has been treated with grandma pleural 10 mg, Lopressor 50 g twice a day, HCTZ 25 mg daily.  He denies significant leg swelling.  He is now on atorvastatin 40 mg for his hyperlipidemia along with Zetia.  He continues to be on dual antiplatelet therapy with aspirin and Plavix.  He is diabetic on metformin.  He presents for one-year evaluation.  Past Medical History  Diagnosis Date  . Hypertension   . Diabetes ([place])   . Coronary disease   . Hyperlipidemia   . COPD (chronic obstructive pulmonary disease) ([place])   . Myocardial infarction [facility])     Past Surgical History  Procedure Laterality Date  . Coronary angioplasty  may 2002    Non-DES stenting of his circumflex, non-DES stenting of the RCA  . Stents  2008    proximal RCA, DES for progession of disease.  . Cardiac catheterization  01/2011    When there was segmental stenosis of the distal RCA, patent PCA stent, patent circumflex stent, and a patent small diagonal with 90% ISR.   Marland Kitchen Hydrocele excision / repair  2009    by Dr Janice Norrie  . Inguinal hernia repair Bilateral     Dr Bubba Camp  . Abdominal aortagram Left 09/16/2013    Procedure: ABDOMINAL Maxcine Ham;  Surgeon: Elam Dutch, MD;  Location: [facility] CATH LAB;  Service: Cardiovascular;  Laterality: Left;  . US echocardiography  01/12/2012    mild concentric LVH, borderline LA enlargement, mild to mod TR  .  Nm myocar perf wall motion  01/08/2011    moderate in size and intensity area of reversible ischemia in the basal to mid inferior and septal territories. Abnormal study    No Known Allergies  Current Outpatient Prescriptions  Medication Sig Dispense Refill  . aspirin EC 81 MG tablet Take 81 mg by mouth daily.    Marland Kitchen atorvastatin (LIPITOR) 40 MG tablet Take 1 tablet (40 mg total) by mouth daily. 90 tablet 1  . CLARITIN 10 MG tablet Take 1 tablet by mouth daily.    . clopidogrel (PLAVIX) 75 MG tablet Take 1 tablet by mouth daily.      . Fluticasone Furoate-Vilanterol (BREO ELLIPTA) 100-25 MCG/INH AEPB Inhale 1 puff into the lungs daily.    . hydrochlorothiazide (HYDRODIURIL) 25 MG tablet Take 1 tablet by mouth daily.    . metFORMIN (GLUCOPHAGE) 1000 MG tablet Take 1 tablet by mouth 2 (two) times daily.    . metoprolol (LOPRESSOR) 50 MG tablet Take 1 tablet (50 mg total) by mouth 2 (two) times daily. 180 tablet 1  . NITROSTAT 0.4 MG SL tablet Place 0.4 mg under the tongue every 5 (five) minutes as needed for chest pain.     . ramipril (ALTACE) 10 MG capsule Take 1 capsule by mouth 2 (two) times daily.    . VENTOLIN HFA 108 (90 Base) MCG/ACT inhaler   0  . ZETIA 10 MG tablet Take 1 tablet by mouth daily.     No current facility-administered medications for this visit.    Social history is notable that he is married has one child and 4 grandchildren. He is retired. He has a 50 year history of tobacco use. He is retired from [employer].  Family History  Problem Relation Age of Onset  . Heart attack Mother   . Heart disease Mother   . Diabetes Father   . Stroke Father   . Hypertension Father   . Cancer Sister   . Hyperlipidemia Sister   . Hypertension Sister    ROS General: Negative; No fevers, chills, or night sweats;  HEENT: Negative; No changes in vision or hearing, sinus congestion, difficulty swallowing Pulmonary: Negative; No cough, wheezing, shortness of breath, hemoptysis Cardiovascular: Negative; No chest pain, presyncope, syncope, palpitations Improvement in recent claudication symptoms. GI: Negative; No nausea, vomiting, diarrhea, or abdominal pain GU: Negative; No dysuria, hematuria, or difficulty voiding Musculoskeletal: Negative; no myalgias, joint pain, or weakness Hematologic/Oncology: Negative; no easy bruising, bleeding Endocrine: Positive for diabetes mellitus; no heat/cold intolerance;  Neuro: Negative; no changes in balance, headaches Skin: Negative; No rashes or skin  lesions Psychiatric: Negative; No behavioral problems, depression Sleep: Positive for obstructive sleep apnea on CPAP. No snoring, daytime sleepiness, hypersomnolence, bruxism, restless legs, hypnogognic hallucinations, no cataplexy Other comprehensive 14 point system review is negative.   PE BP 139/89 mmHg  Pulse 67  Ht _0  (1.753 m)  Wt 204 lb 9.6 oz (92.806 kg)  BMI 30.20 kg/m2   Wt Readings from Last 3 Encounters:  10/05/15 204 lb 9.6 oz (92.806 kg)  08/24/14 203 lb 1.6 oz (92.126 kg)  08/21/14 205 lb 4.8 oz (93.123 kg)   General: Alert, oriented, no distress.  Skin: normal turgor, no rashes HEENT: Normocephalic, atraumatic. Pupils round and reactive; sclera anicteric; extraocular muscles intact;  Nose without nasal septal hypertrophy Mouth/Parynx benign; Mallinpatti scale 3 Neck: No JVD, no carotid bruits; normal carotid upstroke Lungs: clear to ausculatation and percussion; no wheezing or rales Chest wall: without  tenderness to palpitation Heart: RRR, s1 s2 normal 1/6 systolic murmur along the left sternal border. No diastolic murmur rubs thrills or heaves. Abdomen: soft, nontender; no hepatosplenomehaly, BS+; abdominal aorta nontender and not dilated by palpation. Back: no CVA tenderness Pulses 2+ Extremities: no clubbinbg cyanosis or edema, Homan's sign negative  Neurologic: grossly nonfocal; Cranial nerves grossly wnl Psychologic: Normal mood and affect  ECG (independently read by me): Normal sinus rhythm at 67 bpm.  First-degree AV block with a PR interval of 208 ms.  Nonspecific ST changes.  April 2016 ECG (independently read by me): Normal sinus rhythm at 67 bpm.  Borderline first-degree AV block with a PR interval 26 ms.  No significant ST-T changes.  September 2015 ECG (independently read by me): Sinus rhythm with marked sinus arrhythmia with PACs with low atrial etiology and PACs with retrograde P waves.  Prior ECG (independently read by me): Normal sinus  rhythm at 64 beats per minute. No significant ST changes. Mild first-degree AV block with a PR interval of 212 ms.  LABS:  BMP Latest Ref Rng 05/16/2014 09/16/2013 06/29/2013  Glucose 70 - 99 mg/dL 107(H) 101(H) 101(H)  BUN 6 - 23 mg/dL _0 Creatinine 0.50 - 1.35 mg/dL 0.94 0.90 0.86  Sodium 135 - 145 mEq/L 141 143 141  Potassium 3.5 - 5.3 mEq/L 4.1 3.7 4.3  Chloride 96 - 112 mEq/L 104 100 104  CO2 19 - 32 mEq/L 30 - 30  Calcium 8.4 - 10.5 mg/dL 9.7 - 9.7    Hepatic Function Latest Ref Rng 05/16/2014 06/29/2013 08/31/2007  Total Protein 6.0 - 8.3 g/dL 6.1 6.6 6.4  Albumin 3.5 - 5.2 g/dL 3.8 4.1 3.3(L)  AST 0 - 37 U/L _1 ALT 0 - 53 U/L 17 38 28  Alk Phosphatase 39 - 117 U/L 76 61 69  Total Bilirubin 0.2 - 1.2 mg/dL 0.5 0.4 0.7    CBC Latest Ref Rng 09/16/2013 06/29/2013 08/31/2007  WBC 4.0 - 10.5 K/uL - 4.7 4.8  Hemoglobin 13.0 - 17.0 g/dL 15.0 15.2 14.5  Hematocrit 39.0 - 52.0 % 44.0 44.5 42.1  Platelets 150 - 400 K/uL - 232 200   Lab Results  Component Value Date   MCV 96.3 06/29/2013   MCV 95.0 08/31/2007    Lab Results  Component Value Date   TSH 0.621 06/29/2013   No results found for: HGBA1C   Lipid Panel     Component Value Date/Time   CHOL 139 05/16/2014 1115   TRIG 111 05/16/2014 1115   HDL 49 05/16/2014 1115   CHOLHDL 2.8 05/16/2014 1115   VLDL 22 05/16/2014 1115   LDLCALC 68 05/16/2014 1115     RADIOLOGY: No results found.   ASSESSMENT AND PLAN:  Trevor Bailey is a 70 year old African American male who has a 17 year history of documented CAD and has undergone prior intervention to his diagonal, circumflex, and RCA at several sites. His last cardiac catheterization revealed patent stents in the RCA and circumflex but he did have 90% in-stent restenosis of a small diagonal vessel for which medical therapy was recommended.  Presently, his blood pressure is controlled on current therapy including ramipril, metoprolol tartrate, and hydrochlorothiazide.   His resting pulse today is better controlled at 67 on his increased beta blocker therapy with metoprolol 50 mg twice a day.  He has mild first-degree AV block, which is insignificant.  He continues to use CPAP with 100% compliance.  He denies any rate  through snoring or daytime sleepiness.  He is tolerating combination therapy with atorvastatin 40 mg and Zetia 10 mg for his hyperlipidemia with target LDL less than 70.  Dr. Delfina Redwood is his primary physician and I will try to obtain recent laboratory which was done at [facility].  He is diabetic on metformin.  He is tolerating dual antiplatelet therapy without bleeding.  There is no significant edema today on his hydrochlorothiazide.  He is borderline obese in weight reduction was encouraged.  I will see him in one year for reevaluation.  Time spent: 25 minutes  Troy Sine, MD, [facility] 10/07/2015 1:47 PM

## 2016-03-12 ENCOUNTER — Ambulatory Visit (INDEPENDENT_AMBULATORY_CARE_PROVIDER_SITE_OTHER): Payer: Medicare Other | Admitting: Cardiology

## 2016-03-12 ENCOUNTER — Encounter: Payer: Self-pay | Admitting: Cardiology

## 2016-03-12 DIAGNOSIS — I2510 Atherosclerotic heart disease of native coronary artery without angina pectoris: Secondary | ICD-10-CM

## 2016-03-12 DIAGNOSIS — Z9861 Coronary angioplasty status: Secondary | ICD-10-CM

## 2016-03-12 DIAGNOSIS — R079 Chest pain, unspecified: Secondary | ICD-10-CM | POA: Diagnosis not present

## 2016-03-12 DIAGNOSIS — E785 Hyperlipidemia, unspecified: Secondary | ICD-10-CM

## 2016-03-12 DIAGNOSIS — I10 Essential (primary) hypertension: Secondary | ICD-10-CM

## 2016-03-12 DIAGNOSIS — I70213 Atherosclerosis of native arteries of extremities with intermittent claudication, bilateral legs: Secondary | ICD-10-CM

## 2016-03-12 NOTE — Assessment & Plan Note (Signed)
Type 2 NIDDM, normal renal function

## 2016-03-12 NOTE — Assessment & Plan Note (Signed)
Pt has noted SSCP- "burning" at night when he lays down, no exertional symptoms

## 2016-03-12 NOTE — Patient Instructions (Addendum)
Medication Instructions:  Your physician recommends that you continue on your current medications as directed. Please refer to the Current Medication list given to you today.  Labwork: None   Testing/Procedures: None   Follow-Up: Your physician wants you to follow-up in: February 2018 with Dr Claiborne Billings. You will receive a reminder letter in the mail two months in advance. If you don't receive a letter, please call our office to schedule the follow-up appointment.  Any Other Special Instructions Will Be Listed Below (If Applicable).  CONTACT OFFICE IF ANY CHANGE IN CHEST PAIN  If you need a refill on your cardiac medications before your next appointment, please call your pharmacy.

## 2016-03-12 NOTE — Assessment & Plan Note (Signed)
Controlled.  

## 2016-03-12 NOTE — Assessment & Plan Note (Signed)
LDL 68 Dec 2015

## 2016-03-12 NOTE — Progress Notes (Signed)
03/12/2016 Trevor Bailey   10-Jul-1945  [id number]  Primary Physician Kandice Hams, MD Primary Cardiologist: Dr Claiborne Billings  HPI:  70 year old African American male who is a former patient of Dr. Rollene Fare, now followed by Dr Claiborne Billings.   [name] has a history of hypertension, type 2 diabetes mellitus, CAD, and hyperlipidemia. In May 2000 he underwent mid circumflex and proximal diagonal PTCA/non DES stenting. In 2002, he underwent non-DES stenting of the circumflex coronary artery as well as mid RCA .  In 2008, he underwent DES stenting of the proximal RCA. His last catheterization in September 2012 showed segmental disease of the distal RCA with a patent RCA stent, a patent circumflex stent and the small diagonal vessel with 90% in-stent restenosis that was felt to be a very small vessel and medical therapy was recommended. Subsequently he has done well without recurrent anginal symptoms. An echo Doppler study in August 2013 showed mild concentric LVH with grade 1 diastolic dysfunction. There was mild mitral annular calcification with mild MR, mild to moderate TR. His last nuclear perfusion study in 2012 was mildly abnormal and led to the cardiac catheterization.  The patient has obstructive sleep apnea and has been on CPAP therapy for 6 years. He has smoked for approximately 50 years. He sees Dr. Delfina Redwood for primary care.   He sees Dr.Charles Fields  for PVD and in February 2011 underwent lower extremity angiographic study  and was found to have 70% stenosis of the right common femoral artery, and multilevel areas of 30-50% stenoses of the superficial femoral artery bilaterally, left greater than right, and 2 vessel runoff with peroneal and posterior tibial artery bilaterally.  He underwent physical therapy and has noticed significant improvement in his prior claudication.  He is in the office today as a referral from his PCP. The pt had noted SSCP "burning" at night when he lay down in bed. He  denies any exertional chest burning or tightness. He denies any exertional dyspnea. His PCP felt his symptoms were secondary to GERD and added a PPI but wanted him checked here as well. The pt denies any radiation to his arms, jaw, or back.    Current Outpatient Prescriptions  Medication Sig Dispense Refill  . aspirin EC 81 MG tablet Take 81 mg by mouth daily.    Marland Kitchen atorvastatin (LIPITOR) 40 MG tablet Take 1 tablet (40 mg total) by mouth daily. 90 tablet 1  . CLARITIN 10 MG tablet Take 1 tablet by mouth daily.    . clopidogrel (PLAVIX) 75 MG tablet Take 1 tablet by mouth daily.    . finasteride (PROSCAR) 5 MG tablet Take 1 tablet by mouth daily.    . Fluticasone Furoate-Vilanterol (BREO ELLIPTA) 100-25 MCG/INH AEPB Inhale 1 puff into the lungs daily.    . hydrochlorothiazide (HYDRODIURIL) 25 MG tablet Take 1 tablet by mouth daily.    . metFORMIN (GLUCOPHAGE) 1000 MG tablet Take 1 tablet by mouth 2 (two) times daily.    . metoprolol (LOPRESSOR) 50 MG tablet Take 1 tablet (50 mg total) by mouth 2 (two) times daily. 180 tablet 1  . NITROSTAT 0.4 MG SL tablet Place 0.4 mg under the tongue every 5 (five) minutes as needed for chest pain.     Marland Kitchen omeprazole (PRILOSEC) 20 MG capsule Take 1 capsule by mouth daily.    . ramipril (ALTACE) 10 MG capsule Take 1 capsule by mouth 2 (two) times daily.    . VENTOLIN HFA 108 (90 Base) MCG/ACT  inhaler   0  . ZETIA 10 MG tablet Take 1 tablet by mouth daily.     No current facility-administered medications for this visit.     No Known Allergies  Social History   Social History  . Marital status: Married    Spouse name: N/A  . Number of children: N/A  . Years of education: N/A   Occupational History  . Not on file.   Social History Main Topics  . Smoking status: Former Smoker    Packs/day: 0.10    Years: 50.00    Types: Cigarettes  . Smokeless tobacco: Never Used     Comment: pt states that he is cutting back on smoking  . Alcohol use 0.0 oz/week       Comment: moderately  . Drug use: No  . Sexual activity: Not on file   Other Topics Concern  . Not on file   Social History Narrative  . No narrative on file     Review of Systems: General: negative for chills, fever, night sweats or weight changes.  Cardiovascular: negative for chest pain, dyspnea on exertion, edema, orthopnea, palpitations, paroxysmal nocturnal dyspnea or shortness of breath Dermatological: negative for rash Respiratory: negative for cough or wheezing Urologic: negative for hematuria Abdominal: negative for nausea, vomiting, diarrhea, bright red blood per rectum, melena, or hematemesis Neurologic: negative for visual changes, syncope, or dizziness All other systems reviewed and are otherwise negative except as noted above.    Blood pressure 124/70, pulse 88, height '5\' 9"'$  (1.753 m), weight 205 lb (93 kg).  General appearance: alert, cooperative and no distress Neck: no carotid bruit and no JVD Lungs: clear to auscultation bilaterally Heart: regular rate and rhythm Extremities: extremities normal, atraumatic, no cyanosis or edema Skin: Skin color, texture, turgor normal. No rashes or lesions Neurologic: Grossly normal  EKG NSR, NSST changes  ASSESSMENT AND PLAN:   Chest pain with moderate risk of acute coronary syndrome Pt has noted SSCP- "burning" at night when he lays down, no exertional symptoms  CAD S/P percutaneous coronary angioplasty Multiple PCI's-'00, '02, '08, Sept 2012-90% Dx ISR- medical Rx  Essential hypertension Controlled  Hyperlipidemia with target LDL less than 70 LDL 68 Dec 2015  Type 2 diabetes mellitus (HCC) Type 2 NIDDM, normal renal function  Atherosclerosis of native artery of extremity with intermittent claudication (HCC) Followed by Dr Su Grand   PLAN  [name]'s symptoms do sound like GERD although he has known CAD and multiple risk factors for progression of CAD. He just started his PPI today. His last Myoview was  abnormal leading to his last cath (medical Rx). I told the pt to closely monitor his symptoms. If his chest discomfort persists despite PPI or if he develops exertional symptoms he will contact us. If he does I suspect he will need an angiogram.   Kerin Ransom PA-C 03/12/2016 10:52 AM

## 2016-03-12 NOTE — Assessment & Plan Note (Signed)
Multiple PCI's-'00, '02, '08, Sept 2012-90% Dx ISR- medical Rx

## 2016-03-12 NOTE — Assessment & Plan Note (Signed)
Followed by Dr Su Grand

## 2016-07-23 ENCOUNTER — Telehealth: Payer: Self-pay | Admitting: *Deleted

## 2016-07-23 NOTE — Telephone Encounter (Signed)
Faxed approval to hold plavix for colonoscopy scheduled for 07/28/16.

## 2016-07-28 ENCOUNTER — Encounter: Payer: Self-pay | Admitting: Cardiovascular Disease

## 2017-04-01 ENCOUNTER — Ambulatory Visit: Payer: Medicare Other | Admitting: Cardiovascular Disease

## 2017-04-01 ENCOUNTER — Encounter: Payer: Self-pay | Admitting: Cardiovascular Disease

## 2017-04-01 VITALS — BP 138/84 | HR 67 | Ht 69.0 in | Wt 194.0 lb

## 2017-04-01 DIAGNOSIS — Z9861 Coronary angioplasty status: Secondary | ICD-10-CM | POA: Diagnosis not present

## 2017-04-01 DIAGNOSIS — I440 Atrioventricular block, first degree: Secondary | ICD-10-CM | POA: Diagnosis not present

## 2017-04-01 DIAGNOSIS — I2510 Atherosclerotic heart disease of native coronary artery without angina pectoris: Secondary | ICD-10-CM

## 2017-04-01 DIAGNOSIS — G4733 Obstructive sleep apnea (adult) (pediatric): Secondary | ICD-10-CM | POA: Diagnosis not present

## 2017-04-01 DIAGNOSIS — E1159 Type 2 diabetes mellitus with other circulatory complications: Secondary | ICD-10-CM

## 2017-04-01 DIAGNOSIS — Z9989 Dependence on other enabling machines and devices: Secondary | ICD-10-CM

## 2017-04-01 DIAGNOSIS — E785 Hyperlipidemia, unspecified: Secondary | ICD-10-CM

## 2017-04-01 DIAGNOSIS — I10 Essential (primary) hypertension: Secondary | ICD-10-CM | POA: Diagnosis not present

## 2017-04-01 NOTE — Patient Instructions (Signed)
Medication Instructions:  Your physician recommends that you continue on your current medications as directed. Please refer to the Current Medication list given to you today.  Follow-Up: Your physician wants you to follow-up in: 12 months with Dr. Claiborne Billings.  You will receive a reminder letter in the mail two months in advance. If you don't receive a letter, please call our office to schedule the follow-up appointment.   If you need a refill on your cardiac medications before your next appointment, please call your pharmacy.

## 2017-04-01 NOTE — Progress Notes (Signed)
Patient ID: Trevor Bailey, male   DOB: 1945/11/02, 71 y.o.   MRN: [id number]    Primary M.D.: Dr. Delfina Redwood  HPI:  [name] is a 71 year old African American male who is a former patient of Dr. Rollene Fare.  He presents for an 18 month evaluation.  [name] has a history of hypertension, type 2 diabetes mellitus, CAD, and hyperlipidemia. In May 2000 he underwent mid circumflex and proximal diagonal PTCA/non DES stenting. In 2002, he underwent non-DES stenting of the circumflex coronary artery as well as mid RCA.  In 2008, he underwent DES stenting of the proximal RCA. His last catheterization in September 2012 showed segmental disease of the distal RCA with a patent RCA stent, a patent circumflex stent and the small diagonal vessel with 90% in-stent restenosis that was felt to be a very small vessel and medical therapy was recommended. Subsequently he has done well without recurrent anginal symptoms. An echo Doppler study in August 2013 showed mild concentric LVH with grade 1 diastolic dysfunction. There was mild mitral annular calcification with mild MR, mild to moderate TR. His last nuclear perfusion study in 2012 was mildly abnormal and led to the cardiac catheterization.  The patient has obstructive sleep apnea and has been on CPAP therapy for >7 years. He had smoked for approximately 50 years. He sees Dr. Delfina Redwood for primary care.   He sees Dr.Charles Fields  for PVD and in February 2011 underwent lower extremity angiographic study  and was found to have 70% stenosis of the right common femoral artery, and multilevel areas of 30-50% stenoses of the superficial femoral artery bilaterally, left greater than right, and 2 vessel runoff with peroneal and posterior tibial artery bilaterally.  He underwent physical therapy and has noticed significant improvement in his prior claudication.  Since I last saw him in 2017.  He has continued to do well.  He specifically denies any episodes of chest pain or  shortness of breath.  He keeps active, caring for 4 grandchildren and 3 great-grandchildren.  He walks at least 3 days per week.  He is unaware any change in claudication symptoms.  He has not seen Dr. Oneida Alar in over a year.  He has continued to be on ramipril 10 mg, metoprolol, tartrate 25 Mill grams twice a day, and HCTZ 25 mg daily for hypertension.  He is on aspirin and Plavix for dual antiplatelet therapy.  He denies bleeding.  He is on combination therapy with atorvastatin 40 mg and Zetia 10 mg.  He takes real lip to for his lung disease.  He is diabetic on metformin 1000 mg twice a day.  He takes pantoprazole for GERD, which is controlled.  He admits to 100% compliance with CPAP.  He had blood work done by Dr. Delfina Redwood in August 2018.  Total cholesterol was 160, HDL 62, LDL 77, triglycerides 109.  Hemoglobin A1c was 6.7.  Had normal LFTs.  Renal function was normal with a creatinine of 0.95.  He presents for follow-up evaluation.  Past Medical History:  Diagnosis Date  . COPD (chronic obstructive pulmonary disease) ([place])   . Coronary disease   . Diabetes ([place])   . Hyperlipidemia   . Hypertension   . Myocardial infarction [facility])     Past Surgical History:  Procedure Laterality Date  . CARDIAC CATHETERIZATION  01/2011   When there was segmental stenosis of the distal RCA, patent PCA stent, patent circumflex stent, and a patent small diagonal with 90% ISR.   Marland Kitchen  CORONARY ANGIOPLASTY  may 2002   Non-DES stenting of his circumflex, non-DES stenting of the RCA  . HYDROCELE EXCISION / REPAIR  2009   by Dr Janice Norrie  . INGUINAL HERNIA REPAIR Bilateral    Dr Bubba Camp  . NM MYOCAR PERF WALL MOTION  01/08/2011   moderate in size and intensity area of reversible ischemia in the basal to mid inferior and septal territories. Abnormal study  . stents  2008   proximal RCA, DES for progession of disease.  Marland Kitchen US ECHOCARDIOGRAPHY  01/12/2012   mild concentric LVH, borderline LA enlargement, mild to mod TR    No  Known Allergies  Current Outpatient Medications  Medication Sig Dispense Refill  . aspirin EC 81 MG tablet Take 81 mg by mouth daily.    Marland Kitchen atorvastatin (LIPITOR) 40 MG tablet Take 1 tablet (40 mg total) by mouth daily. 90 tablet 1  . CLARITIN 10 MG tablet Take 1 tablet by mouth daily.    . clopidogrel (PLAVIX) 75 MG tablet Take 1 tablet by mouth daily.    . finasteride (PROSCAR) 5 MG tablet Take 1 tablet by mouth daily.    . Fluticasone Furoate-Vilanterol (BREO ELLIPTA) 100-25 MCG/INH AEPB Inhale 1 puff into the lungs daily.    . hydrochlorothiazide (HYDRODIURIL) 25 MG tablet Take 1 tablet by mouth daily.    . metFORMIN (GLUCOPHAGE) 1000 MG tablet Take 1 tablet by mouth 2 (two) times daily.    . metoprolol tartrate (LOPRESSOR) 25 MG tablet Take 25 mg 2 (two) times daily by mouth.    Marland Kitchen NITROSTAT 0.4 MG SL tablet Place 0.4 mg under the tongue every 5 (five) minutes as needed for chest pain.     . pantoprazole (PROTONIX) 40 MG tablet Take 40 mg daily by mouth.    . ramipril (ALTACE) 10 MG capsule Take 1 capsule by mouth 2 (two) times daily.    . VENTOLIN HFA 108 (90 Base) MCG/ACT inhaler   0  . ZETIA 10 MG tablet Take 1 tablet by mouth daily.     No current facility-administered medications for this visit.     Social history is notable that he is married has one child and 4 grandchildren. He is retired. He has a 50 year history of tobacco use. He is retired from [employer].  Family History  Problem Relation Age of Onset  . Heart attack Mother   . Heart disease Mother   . Diabetes Father   . Stroke Father   . Hypertension Father   . Cancer Sister   . Hyperlipidemia Sister   . Hypertension Sister    ROS General: Negative; No fevers, chills, or night sweats;  HEENT: Negative; No changes in vision or hearing, sinus congestion, difficulty swallowing Pulmonary: Negative; No cough, wheezing, shortness of breath, hemoptysis Cardiovascular: Negative; No chest pain,  presyncope, syncope, palpitations Improvement in recent claudication symptoms. GI: Negative; No nausea, vomiting, diarrhea, or abdominal pain GU: Negative; No dysuria, hematuria, or difficulty voiding Musculoskeletal: Negative; no myalgias, joint pain, or weakness Hematologic/Oncology: Negative; no easy bruising, bleeding Endocrine: Positive for diabetes mellitus; no heat/cold intolerance;  Neuro: Negative; no changes in balance, headaches Skin: Negative; No rashes or skin lesions Psychiatric: Negative; No behavioral problems, depression Sleep: Positive for obstructive sleep apnea on CPAP. No snoring, daytime sleepiness, hypersomnolence, bruxism, restless legs, hypnogognic hallucinations, no cataplexy Other comprehensive 14 point system review is negative.   PE BP 138/84   Pulse 67   Ht '5\' 9"'$  (1.753 m)  Wt 194 lb (88 kg)   BMI 28.65 kg/m    Repeat blood pressure by me was 138/80  Wt Readings from Last 3 Encounters:  04/01/17 194 lb (88 kg)  03/12/16 205 lb (93 kg)  10/05/15 204 lb 9.6 oz (92.8 kg)     Physical Exam BP 138/84   Pulse 67   Ht '5\' 9"'$  (1.753 m)   Wt 194 lb (88 kg)   BMI 28.65 kg/m  General: Alert, oriented, no distress.  Skin: normal turgor, no rashes, warm and dry HEENT: Normocephalic, atraumatic. Pupils equal round and reactive to light; sclera anicteric; extraocular muscles intact;  Nose without nasal septal hypertrophy Mouth/Parynx benign; Mallinpatti scale 3 Neck: No JVD, no carotid bruits; normal carotid upstroke Lungs: clear to ausculatation and percussion; no wheezing or rales Chest wall: without tenderness to palpitation Heart: PMI not displaced, RRR, s1 s2 normal, 1/6 systolic murmur, no diastolic murmur, no rubs, gallops, thrills, or heaves Abdomen: soft, nontender; no hepatosplenomehaly, BS+; abdominal aorta nontender and not dilated by palpation. Back: no CVA tenderness Pulses 2+; distal pulses good. Musculoskeletal: full range of motion,  normal strength, no joint deformities Extremities: no clubbing cyanosis or edema, Homan's sign negative  Neurologic: grossly nonfocal; Cranial nerves grossly wnl Psychologic: Normal mood and affect  ECG (independently read by me): Normal sinus rhythm at 67 bpm.  First-degree AV block with a PR interval at 210 ms.  No significant ST-T changes.  May 2017 ECG (independently read by me): Normal sinus rhythm at 67 bpm.  First-degree AV block with a PR interval of 208 ms.  Nonspecific ST changes.  April 2016 ECG (independently read by me): Normal sinus rhythm at 67 bpm.  Borderline first-degree AV block with a PR interval 26 ms.  No significant ST-T changes.  September 2015 ECG (independently read by me): Sinus rhythm with marked sinus arrhythmia with PACs with low atrial etiology and PACs with retrograde P waves.  Prior ECG (independently read by me): Normal sinus rhythm at 64 beats per minute. No significant ST changes. Mild first-degree AV block with a PR interval of 212 ms.  LABS:  BMP Latest Ref Rng & Units 05/16/2014 09/16/2013 06/29/2013  Glucose 70 - 99 mg/dL 107(H) 101(H) 101(H)  BUN 6 - 23 mg/dL '11 9 15  '$ Creatinine 0.50 - 1.35 mg/dL 0.94 0.90 0.86  Sodium 135 - 145 mEq/L 141 143 141  Potassium 3.5 - 5.3 mEq/L 4.1 3.7 4.3  Chloride 96 - 112 mEq/L 104 100 104  CO2 19 - 32 mEq/L 30 - 30  Calcium 8.4 - 10.5 mg/dL 9.7 - 9.7    Hepatic Function Latest Ref Rng & Units 05/16/2014 06/29/2013 08/31/2007  Total Protein 6.0 - 8.3 g/dL 6.1 6.6 6.4  Albumin 3.5 - 5.2 g/dL 3.8 4.1 3.3(L)  AST 0 - 37 U/L '16 26 25  '$ ALT 0 - 53 U/L 17 38 28  Alk Phosphatase 39 - 117 U/L 76 61 69  Total Bilirubin 0.2 - 1.2 mg/dL 0.5 0.4 0.7    CBC Latest Ref Rng & Units 09/16/2013 06/29/2013 08/31/2007  WBC 4.0 - 10.5 K/uL - 4.7 4.8  Hemoglobin 13.0 - 17.0 g/dL 15.0 15.2 14.5  Hematocrit 39.0 - 52.0 % 44.0 44.5 42.1  Platelets 150 - 400 K/uL - 232 200   Lab Results  Component Value Date   MCV 96.3 06/29/2013    MCV 95.0 08/31/2007    Lab Results  Component Value Date   TSH 0.621 06/29/2013  No results found for: HGBA1C   Lipid Panel     Component Value Date/Time   CHOL 139 05/16/2014 1115   TRIG 111 05/16/2014 1115   HDL 49 05/16/2014 1115   CHOLHDL 2.8 05/16/2014 1115   VLDL 22 05/16/2014 1115   LDLCALC 68 05/16/2014 1115     RADIOLOGY: No results found  IMPRESSION:  1. CAD S/P percutaneous coronary angioplasty   2. Essential hypertension   3. OSA on CPAP   4. Hyperlipidemia with target LDL less than 70   5. Type 2 diabetes mellitus with other circulatory complication, without long-term current use of insulin ([place])   6. First degree AV block     ASSESSMENT AND PLAN:  Mr. Heid is a 71 year old African American male who has a long history of documented CAD and has undergone prior intervention to his diagonal, circumflex, and RCA at several sites. His last cardiac catheterization revealed patent stents in the RCA and circumflex but he did have 90% in-stent restenosis of a small diagonal vessel for which medical therapy was recommended.  At present, he continues to be stable from an anginal standpoint.  He is not having any chest pain or exertional dyspnea.  In tin use to be on ramipril 10 mg, metoprolol 25 mm twice a day and HCTZ for blood pressure and his CAD.  His blood pressure today controlled, but I did discuss with him that he updated guidelines with ideal blood pressure less than 9:87 systolically.  His not had any sodium to his food.  He denies any swelling.  He has PVD but denies any claudication symptoms.  I reviewed his recent blood work.  He is on combination atorvastatin and Zetia.  Most recent LDL was 77.  We discussed trying to improve diet even further if possible for the target LDL less than 70.  He is diabetic on metformin.  Hemoglobin A1c was controlled at 6.7.  He does not have any wheezing on exam today and is on Breo Ellipta.  He has obstructive sleep apnea and  continues to use CPAP with 100% compliance.  His ECG is stable and he continues to show mild first-degree AV block, which has been present previously.  He is exercising at least 3 days per week.  I discussed importance of even trying to exercise up to 5 days per week if at all possible.  I reviewed lab work from his primary physician.  As long as he remains stable from a cardiac standpoint, I will see him in one year for reevaluation.     Time spent: 25 minutes  Troy Sine, MD, [facility] 04/01/2017 9:15 AM

## 2018-03-08 ENCOUNTER — Other Ambulatory Visit: Payer: Self-pay | Admitting: Internal Medicine

## 2018-03-08 DIAGNOSIS — R220 Localized swelling, mass and lump, head: Secondary | ICD-10-CM

## 2018-03-11 ENCOUNTER — Ambulatory Visit
Admission: RE | Admit: 2018-03-11 | Discharge: 2018-03-11 | Disposition: A | Discharge status: Home / Self Care | Payer: Medicare Other | Source: Ambulatory Visit | Attending: Internal Medicine | Admitting: Internal Medicine

## 2018-03-11 DIAGNOSIS — R220 Localized swelling, mass and lump, head: Secondary | ICD-10-CM

## 2018-03-11 MED ORDER — IOPAMIDOL (ISOVUE-300) INJECTION 61%
75.0000 mL | Freq: Once | INTRAVENOUS | Status: AC | PRN
  Administered 2018-03-11: 75 mL via INTRAVENOUS

## 2018-05-04 ENCOUNTER — Ambulatory Visit: Payer: Medicare Other | Admitting: Cardiovascular Disease

## 2018-05-04 ENCOUNTER — Encounter: Payer: Self-pay | Admitting: Cardiovascular Disease

## 2018-05-04 ENCOUNTER — Encounter (INDEPENDENT_AMBULATORY_CARE_PROVIDER_SITE_OTHER): Payer: Self-pay

## 2018-05-04 VITALS — BP 150/80 | HR 72 | Ht 69.0 in | Wt 197.4 lb

## 2018-05-04 DIAGNOSIS — I440 Atrioventricular block, first degree: Secondary | ICD-10-CM

## 2018-05-04 DIAGNOSIS — E785 Hyperlipidemia, unspecified: Secondary | ICD-10-CM

## 2018-05-04 DIAGNOSIS — I10 Essential (primary) hypertension: Secondary | ICD-10-CM

## 2018-05-04 DIAGNOSIS — E1159 Type 2 diabetes mellitus with other circulatory complications: Secondary | ICD-10-CM

## 2018-05-04 DIAGNOSIS — G4733 Obstructive sleep apnea (adult) (pediatric): Secondary | ICD-10-CM

## 2018-05-04 DIAGNOSIS — Z9989 Dependence on other enabling machines and devices: Secondary | ICD-10-CM

## 2018-05-04 DIAGNOSIS — I2510 Atherosclerotic heart disease of native coronary artery without angina pectoris: Secondary | ICD-10-CM

## 2018-05-04 DIAGNOSIS — Z9861 Coronary angioplasty status: Secondary | ICD-10-CM | POA: Diagnosis not present

## 2018-05-04 MED ORDER — ATORVASTATIN CALCIUM 80 MG PO TABS: 80.0000 mg | ORAL_TABLET | Freq: Every day | ORAL | 3 refills | Status: DC

## 2018-05-04 MED ORDER — AMLODIPINE BESYLATE 5 MG PO TABS: 5.0000 mg | ORAL_TABLET | Freq: Every day | ORAL | 3 refills | Status: DC

## 2018-05-04 MED ORDER — NITROGLYCERIN 0.4 MG SL SUBL: 0.4000 mg | SUBLINGUAL_TABLET | SUBLINGUAL | 6 refills | Status: DC | PRN

## 2018-05-04 MED ORDER — ATORVASTATIN CALCIUM 80 MG PO TABS
80.0000 mg | ORAL_TABLET | Freq: Every day | ORAL | 3 refills | Status: DC
Start: 2018-05-04 — End: 2020-01-17

## 2018-05-04 NOTE — Patient Instructions (Signed)
Medication Instructions:   CONTINUE  TAKING ZETIA 10 MG DAILY  INCREASE TAKING  ATORVASTATIN TO 80 MG  DAILY    START AMLODIPINE  5 MG  ONE TABLET DAILY  If you need a refill on your cardiac medications before your next appointment, please call your pharmacy.   Lab work: LIPID  CMP PLEASE DO IN MARCH 2020 DO NOT EAT OR DRINK THE MORNING OF THE TEST.  If you have labs (blood work) drawn today and your tests are completely normal, you will receive your results only by: Marland Kitchen MyChart Message (if you have MyChart) OR . A paper copy in the mail If you have any lab test that is abnormal or we need to change your treatment, we will call you to review the results.  Testing/Procedures: NOT NEEDED  Follow-Up: At [facility], you and your health needs are our priority.  As part of our continuing mission to provide you with exceptional heart care, we have created designated Provider Care Teams.  These Care Teams include your primary Cardiologist (physician) and Advanced Practice Providers (APPs -  Physician Assistants and Nurse Practitioners) who all work together to provide you with the care you need, when you need it. You will need a follow up appointment in 6 months-June 2020.  Please call our office 2 months in advance to schedule this appointment.  You may see Shelva Majestic, MD or one of the following Advanced Practice Providers on your designated Care Team: [place], [place] . Fabian Sharp, PA-C  Any Other Special Instructions Will Be Listed Below (If Applicable).

## 2018-05-04 NOTE — Progress Notes (Signed)
Patient ID: Trevor Bailey, male   DOB: 11/24/1945, 72 y.o.   MRN: [id number]    Primary M.D.: Dr. Delfina Redwood  HPI:  Trevor Bailey is a 72 year old African American male who is a former patient of Dr. Rollene Fare.  He presents for a 13  month evaluation.  Mr. Kerstein has a history of hypertension, type 2 diabetes mellitus, CAD, and hyperlipidemia. In May 2000 he underwent mid circumflex and proximal diagonal PTCA/non DES stenting. In 2002, he underwent non-DES stenting of the circumflex coronary artery as well as mid RCA.  In 2008, he underwent DES stenting of the proximal RCA. His last catheterization in September 2012 showed segmental disease of the distal RCA with a patent RCA stent, a patent circumflex stent and the small diagonal vessel with 90% in-stent restenosis that was felt to be a very small vessel and medical therapy was recommended. Subsequently he has done well without recurrent anginal symptoms. An echo Doppler study in August 2013 showed mild concentric LVH with grade 1 diastolic dysfunction. There was mild mitral annular calcification with mild MR, mild to moderate TR. His last nuclear perfusion study in 2012 was mildly abnormal and led to the cardiac catheterization.  The patient has obstructive sleep apnea and has been on CPAP therapy for >7 years. He had smoked for approximately 50 years. He sees Dr. Delfina Redwood for primary care.   He sees Dr.Charles Fields  for PVD and in February 2011 underwent lower extremity angiographic study  and was found to have 70% stenosis of the right common femoral artery, and multilevel areas of 30-50% stenoses of the superficial femoral artery bilaterally, left greater than right, and 2 vessel runoff with peroneal and posterior tibial artery bilaterally.  He underwent physical therapy and has noticed significant improvement in his prior claudication.  I last saw him in November 2018 at which time he continued to do well.  He denied any episodes of chest pain or  shortness of breath.  He keeps active, caring for 4 grandchildren and 3 great-grandchildren.  He walks at least 3 days per week.  He was unaware any change in claudication symptoms.  He has not seen Dr. Oneida Alar in over a year.  He has continued to be on ramipril 10 mg, metoprolol, tartrate 25 mg twice a day, and HCTZ 25 mg daily for hypertension.  He is on aspirin and Plavix for dual antiplatelet therapy.  He denies bleeding.  He is on combination therapy with atorvastatin 40 mg and Zetia 10 mg.  He takes real lip to for his lung disease.  He is diabetic on metformin 1000 mg twice a day.  He takes pantoprazole for GERD, which is controlled.  He admits to 100% compliance with CPAP.  He had blood work done by Dr. Delfina Redwood in August 2018.  Total cholesterol was 160, HDL 62, LDL 77, triglycerides 109.  Hemoglobin A1c was 6.7.  Had normal LFTs.  Renal function was normal with a creatinine of 0.95.    Since I last saw him, he states his claudication is better.  He is walking 2-3 times per week for at least 2 miles at a time in addition to exercising on a bicycle.  He is able to exercise more and feels better.  He denies any episodes of chest pain PND orthopnea.  He continues to use CPAP.  He presents for reevaluation.  Past Medical History:  Diagnosis Date  . COPD (chronic obstructive pulmonary disease) ([place])   . Coronary disease   .  Diabetes ([place])   . Hyperlipidemia   . Hypertension   . Myocardial infarction [facility])     Past Surgical History:  Procedure Laterality Date  . ABDOMINAL AORTAGRAM Left 09/16/2013   Procedure: ABDOMINAL Maxcine Ham;  Surgeon: Elam Dutch, MD;  Location: [facility]ATH LAB;  Service: Cardiovascular;  Laterality: Left;  . CARDIAC CATHETERIZATION  01/2011   When there was segmental stenosis of the distal RCA, patent PCA stent, patent circumflex stent, and a patent small diagonal with 90% ISR.   Marland Kitchen CORONARY ANGIOPLASTY  may 2002   Non-DES stenting of his circumflex, non-DES stenting of the  RCA  . HYDROCELE EXCISION / REPAIR  2009   by Dr Janice Norrie  . INGUINAL HERNIA REPAIR Bilateral    Dr Bubba Camp  . NM MYOCAR PERF WALL MOTION  01/08/2011   moderate in size and intensity area of reversible ischemia in the basal to mid inferior and septal territories. Abnormal study  . stents  2008   proximal RCA, DES for progession of disease.  Marland Kitchen US ECHOCARDIOGRAPHY  01/12/2012   mild concentric LVH, borderline LA enlargement, mild to mod TR    No Known Allergies  Current Outpatient Medications  Medication Sig Dispense Refill  . aspirin EC 81 MG tablet Take 81 mg by mouth daily.    Marland Kitchen CLARITIN 10 MG tablet Take 1 tablet by mouth daily.    . clopidogrel (PLAVIX) 75 MG tablet Take 1 tablet by mouth daily.    . finasteride (PROSCAR) 5 MG tablet Take 1 tablet by mouth daily.    . Fluticasone Furoate-Vilanterol (BREO ELLIPTA) 100-25 MCG/INH AEPB Inhale 1 puff into the lungs daily.    . hydrochlorothiazide (HYDRODIURIL) 25 MG tablet Take 1 tablet by mouth daily.    . metFORMIN (GLUCOPHAGE) 1000 MG tablet Take 1 tablet by mouth 2 (two) times daily.    . metoprolol tartrate (LOPRESSOR) 25 MG tablet Take 25 mg 2 (two) times daily by mouth.    . nitroGLYCERIN (NITROSTAT) 0.4 MG SL tablet Place 1 tablet (0.4 mg total) under the tongue every 5 (five) minutes as needed for chest pain. 25 tablet 6  . pantoprazole (PROTONIX) 40 MG tablet Take 40 mg daily by mouth.    . ramipril (ALTACE) 10 MG capsule Take 1 capsule by mouth 2 (two) times daily.    . VENTOLIN HFA 108 (90 Base) MCG/ACT inhaler   0  . ZETIA 10 MG tablet Take 1 tablet by mouth daily.    Marland Kitchen amLODipine (NORVASC) 5 MG tablet Take 1 tablet (5 mg total) by mouth daily. 90 tablet 3  . atorvastatin (LIPITOR) 80 MG tablet Take 1 tablet (80 mg total) by mouth daily. 90 tablet 3   No current facility-administered medications for this visit.     Social history is notable that he is married has one child and 4 grandchildren. He is retired. He has a 50 year  history of tobacco use. He is retired from [employer].  Family History  Problem Relation Age of Onset  . Heart attack Mother   . Heart disease Mother   . Diabetes Father   . Stroke Father   . Hypertension Father   . Cancer Sister   . Hyperlipidemia Sister   . Hypertension Sister    ROS General: Negative; No fevers, chills, or night sweats;  HEENT: Negative; No changes in vision or hearing, sinus congestion, difficulty swallowing Pulmonary: Negative; No cough, wheezing, shortness of breath, hemoptysis Cardiovascular: Negative; No chest pain,  presyncope, syncope, palpitations Improvement in recent claudication symptoms. GI: Negative; No nausea, vomiting, diarrhea, or abdominal pain GU: Negative; No dysuria, hematuria, or difficulty voiding Musculoskeletal: Negative; no myalgias, joint pain, or weakness Hematologic/Oncology: Negative; no easy bruising, bleeding Endocrine: Positive for diabetes mellitus; no heat/cold intolerance;  Neuro: Negative; no changes in balance, headaches Skin: Negative; No rashes or skin lesions Psychiatric: Negative; No behavioral problems, depression Sleep: Positive for obstructive sleep apnea on CPAP. No snoring, daytime sleepiness, hypersomnolence, bruxism, restless legs, hypnogognic hallucinations, no cataplexy Other comprehensive 14 point system review is negative.   PE BP (!) 150/80   Pulse 72   Ht 5' 9" (1.753 m)   Wt 197 lb 6.4 oz (89.5 kg)   BMI 29.15 kg/m    Repeat blood pressure by me was elevated at 144/80  Wt Readings from Last 3 Encounters:  05/04/18 197 lb 6.4 oz (89.5 kg)  04/01/17 194 lb (88 kg)  03/12/16 205 lb (93 kg)    General: Alert, oriented, no distress.  Skin: normal turgor, no rashes, warm and dry HEENT: Normocephalic, atraumatic. Pupils equal round and reactive to light; sclera anicteric; extraocular muscles intact;  Nose without nasal septal hypertrophy Mouth/Parynx benign; Mallinpatti scale 3 Neck:  No JVD, no carotid bruits; normal carotid upstroke Lungs: clear to ausculatation and percussion; no wheezing or rales Chest wall: without tenderness to palpitation Heart: PMI not displaced, RRR, s1 s2 normal, 1/6 systolic murmur, no diastolic murmur, no rubs, gallops, thrills, or heaves Abdomen: soft, nontender; no hepatosplenomehaly, BS+; abdominal aorta nontender and not dilated by palpation. Back: no CVA tenderness Pulses 2+ still pulses adequate Musculoskeletal: full range of motion, normal strength, no joint deformities Extremities: no clubbing cyanosis or edema, Homan's sign negative  Neurologic: grossly nonfocal; Cranial nerves grossly wnl Psychologic: Normal mood and affect   ECG (independently read by me): Normal sinus rhythm at 72 bpm.  Mild first-degree block with appeared 208 ms.  Nonspecific ST-T changes inferolaterally.  November 2018 ECG (independently read by me): Normal sinus rhythm at 67 bpm.  First-degree AV block with a PR interval at 210 ms.  No significant ST-T changes.  May 2017 ECG (independently read by me): Normal sinus rhythm at 67 bpm.  First-degree AV block with a PR interval of 208 ms.  Nonspecific ST changes.  April 2016 ECG (independently read by me): Normal sinus rhythm at 67 bpm.  Borderline first-degree AV block with a PR interval 26 ms.  No significant ST-T changes.  September 2015 ECG (independently read by me): Sinus rhythm with marked sinus arrhythmia with PACs with low atrial etiology and PACs with retrograde P waves.  Prior ECG (independently read by me): Normal sinus rhythm at 64 beats per minute. No significant ST changes. Mild first-degree AV block with a PR interval of 212 ms.  LABS:  BMP Latest Ref Rng & Units 05/16/2014 09/16/2013 06/29/2013  Glucose 70 - 99 mg/dL 107(H) 101(H) 101(H)  BUN 6 - 23 mg/dL _0 Creatinine 0.50 - 1.35 mg/dL 0.94 0.90 0.86  Sodium 135 - 145 mEq/L 141 143 141  Potassium 3.5 - 5.3 mEq/L 4.1 3.7 4.3  Chloride 96  - 112 mEq/L 104 100 104  CO2 19 - 32 mEq/L 30 - 30  Calcium 8.4 - 10.5 mg/dL 9.7 - 9.7    Hepatic Function Latest Ref Rng & Units 05/16/2014 06/29/2013 08/31/2007  Total Protein 6.0 - 8.3 g/dL 6.1 6.6 6.4  Albumin 3.5 - 5.2 g/dL 3.8 4.1 3.3(L)  AST  0 - 37 U/L _0 ALT 0 - 53 U/L 17 38 28  Alk Phosphatase 39 - 117 U/L 76 61 69  Total Bilirubin 0.2 - 1.2 mg/dL 0.5 0.4 0.7    CBC Latest Ref Rng & Units 09/16/2013 06/29/2013 08/31/2007  WBC 4.0 - 10.5 K/uL - 4.7 4.8  Hemoglobin 13.0 - 17.0 g/dL 15.0 15.2 14.5  Hematocrit 39.0 - 52.0 % 44.0 44.5 42.1  Platelets 150 - 400 K/uL - 232 200   Lab Results  Component Value Date   MCV 96.3 06/29/2013   MCV 95.0 08/31/2007    Lab Results  Component Value Date   TSH 0.621 06/29/2013   No results found for: HGBA1C   Lipid Panel     Component Value Date/Time   CHOL 139 05/16/2014 1115   TRIG 111 05/16/2014 1115   HDL 49 05/16/2014 1115   CHOLHDL 2.8 05/16/2014 1115   VLDL 22 05/16/2014 1115   LDLCALC 68 05/16/2014 1115     RADIOLOGY: No results found  IMPRESSION:  1. CAD S/P percutaneous coronary angioplasty   2. Essential hypertension   3. Hyperlipidemia with target LDL less than 70   4. OSA on CPAP   5. First degree AV block   6. Type 2 diabetes mellitus with other circulatory complication, without long-term current use of insulin (HCC)     ASSESSMENT AND PLAN:  Mr. Poth is a 72 year old African American male who has a long history of documented CAD and has undergone prior intervention to his diagonal, circumflex, and RCA at several sites. His last cardiac catheterization revealed patent stents in the RCA and circumflex but he did have 90% in-stent restenosis of a small diagonal vessel for which medical therapy was recommended.  At present, he continues to be stable from an anginal standpoint.  He is not having any chest pain or exertional dyspnea.  His blood pressure today is mildly increased despite taking ramipril 10 mg,  metoprolol 25 mg twice a day and hydrochlorothiazide 25 mg daily.  I have recommended the addition of amlodipine 5 mg which would be helpful both for blood pressure, as well as CAD and improve blood flow.  He continues to be on atorvastatin 40 and Zetia 10 mg.  Most recent LDL cholesterol was 77 in September 2019.  I have suggested further titration of atorvastatin to 80 mg and attempt to induce additional LDL lowering to in the 60s or 50s if possible.  He continues to be on aspirin and Plavix for antiplatelet therapy and denies any bleeding.  He is on Ventolin inhaler and Breo Ellipta and denies active wheezing.  He takes pantoprazole for GERD.  He is diabetic on metformin.  He will undergo repeat chemistry and lipid studies in 3 months and adjustments will be made to his medical regimen if necessary.  Has PVD but denies any claudication symptoms.  I encouraged him to continue walking and increase his frequency from 2 to 3 days/week to at least 5 days/week if possible.  He continues to use CPAP therapy.  He denies breakthrough snoring.  I will see him in 6 months for reevaluation.  Time spent 25 minutes  Troy Sine, MD, [facility] 05/06/2018 6:03 PM

## 2018-05-06 ENCOUNTER — Encounter: Payer: Self-pay | Admitting: Cardiovascular Disease

## 2018-07-28 ENCOUNTER — Telehealth: Payer: Self-pay | Admitting: Cardiovascular Disease

## 2018-07-28 NOTE — Telephone Encounter (Signed)
New Message           Patient is calling in today to see if we can get his lab work from his primary doctor ([place]) from [facility]. Patient seen his PCP today and he done a complete work up on him. Patient states he was suppose to do labs with [place] this month, but his PCP has done it and would like for [place] to have the lab results. Pls call and advise.

## 2018-07-28 NOTE — Telephone Encounter (Signed)
Faxed PCP to receive information and send to [facility].  [place] fax number.

## 2018-07-28 NOTE — Telephone Encounter (Signed)
Patient called and notified.

## 2018-10-26 ENCOUNTER — Telehealth: Payer: Self-pay | Admitting: Physician Assistant

## 2018-10-26 NOTE — Telephone Encounter (Signed)
smartphone/ consent/ my chart via emailed/ pre reg completed  °

## 2018-11-01 NOTE — Progress Notes (Signed)
Virtual Visit via Video Note   This visit type was conducted due to national recommendations for restrictions regarding the COVID-19 Pandemic (e.g. social distancing) in an effort to limit this patient's exposure and mitigate transmission in our community.  Due to his co-morbid illnesses, this patient is at least at moderate risk for complications without adequate follow up.  This format is felt to be most appropriate for this patient at this time.  All issues noted in this document were discussed and addressed.  A limited physical exam was performed with this format.  Please refer to the patient's chart for his consent to telehealth for [facility].   Date:  11/02/2018   ID:  Trevor Bailey, DOB 07-03-1945, MRN [id number]  Patient Location: Home Provider Location: Office  PCP:  [name], MD  Cardiologist:  Shelva Majestic, MD  Electrophysiologist:  None   Evaluation Performed:  Follow-Up Visit  Chief Complaint:  Follow up, CAD  History of Present Illness:    Trevor Bailey is a 73 y.o. male with CAD, HTN, PACs, PVD, OSA on CPAP, DM2, HLD with LDL goal < 70, and tobacco abuse. He underwent mid Cx and proximal diagonal PTCA and non-DES stenting in 2000; in 2002 non-DES to Cx and proximal RCA; in 2008 he had DES to proximal RCA; in 2012 heart cath (after abnormal nuclear stress test) with patent stents in the RCA and Cx, but small diagonal vessel with 90% in-stent restenosis treated medically. He has grade 1 DD and mild MAC with mild MR, mild to moderate TR by echo 2013. Dr. Oneida Alar follows his PVD (70% stenosis of right common femoral artery by angiography), improved with PT.   He was last seen in clinic with Dr. Claiborne Billings on 05/04/18. He is on DAPT with ASA and plavix, on lipitor and zetia. His HTN is controlled by ramipril 10 mg, lopressor 25 mg BID, and HCTZ 25 mg. He is also taking 5 mg norvasc. He is active and walks three times a week, claudication has significantly improved. He is  compliant on CPAP.   He presents today for a follow up appt. He is doing well. He isn't walking due to pandemic, but does mow the lawn himself (push mower). No lower extremity edema, orthopnea, syncope. No anginal symptoms. Complaint on CPAP. Overall he is doing well, no complaints. His LDL is still not at goal. I will refer him to lipid clinic.    The patient does not have symptoms concerning for COVID-19 infection (fever, chills, cough, or new shortness of breath).    Past Medical History:  Diagnosis Date  . COPD (chronic obstructive pulmonary disease) ([place])   . Coronary disease   . Diabetes ([place])   . Hyperlipidemia   . Hypertension   . Myocardial infarction [facility])    Past Surgical History:  Procedure Laterality Date  . ABDOMINAL AORTAGRAM Left 09/16/2013   Procedure: ABDOMINAL Maxcine Ham;  Surgeon: Elam Dutch, MD;  Location: [facility] CATH LAB;  Service: Cardiovascular;  Laterality: Left;  . CARDIAC CATHETERIZATION  01/2011   When there was segmental stenosis of the distal RCA, patent PCA stent, patent circumflex stent, and a patent small diagonal with 90% ISR.   Marland Kitchen CORONARY ANGIOPLASTY  may 2002   Non-DES stenting of his circumflex, non-DES stenting of the RCA  . HYDROCELE EXCISION / REPAIR  2009   by Dr Janice Norrie  . INGUINAL HERNIA REPAIR Bilateral    Dr Bubba Camp  . NM MYOCAR PERF WALL MOTION  01/08/2011   moderate in size and intensity area of reversible ischemia in the basal to mid inferior and septal territories. Abnormal study  . stents  2008   proximal RCA, DES for progession of disease.  Marland Kitchen US ECHOCARDIOGRAPHY  01/12/2012   mild concentric LVH, borderline LA enlargement, mild to mod TR     Current Meds  Medication Sig  . amLODipine (NORVASC) 5 MG tablet Take 1 tablet (5 mg total) by mouth daily.  Marland Kitchen aspirin EC 81 MG tablet Take 81 mg by mouth daily.  Marland Kitchen atorvastatin (LIPITOR) 80 MG tablet Take 1 tablet (80 mg total) by mouth daily.  Marland Kitchen CLARITIN 10 MG tablet Take 1 tablet by mouth  daily.  . clopidogrel (PLAVIX) 75 MG tablet Take 1 tablet by mouth daily.  . finasteride (PROSCAR) 5 MG tablet Take 1 tablet by mouth daily.  . Fluticasone Furoate-Vilanterol (BREO ELLIPTA) 100-25 MCG/INH AEPB Inhale 1 puff into the lungs daily.  . hydrochlorothiazide (HYDRODIURIL) 25 MG tablet Take 1 tablet by mouth daily.  . metFORMIN (GLUCOPHAGE) 1000 MG tablet Take 1 tablet by mouth 2 (two) times daily.  . metoprolol tartrate (LOPRESSOR) 25 MG tablet Take 25 mg 2 (two) times daily by mouth.  . nitroGLYCERIN (NITROSTAT) 0.4 MG SL tablet Place 1 tablet (0.4 mg total) under the tongue every 5 (five) minutes as needed for chest pain.  . pantoprazole (PROTONIX) 40 MG tablet Take 40 mg daily by mouth.  . ramipril (ALTACE) 10 MG capsule Take 1 capsule by mouth 2 (two) times daily.  . VENTOLIN HFA 108 (90 Base) MCG/ACT inhaler   . ZETIA 10 MG tablet Take 1 tablet by mouth daily.     Allergies:   Patient has no known allergies.   Social History   Tobacco Use  . Smoking status: Former Smoker    Packs/day: 0.10    Years: 50.00    Pack years: 5.00    Types: Cigarettes  . Smokeless tobacco: Never Used  . Tobacco comment: pt states that he is cutting back on smoking  Substance Use Topics  . Alcohol use: Yes    Alcohol/week: 0.0 standard drinks    Comment: moderately  . Drug use: No     Family Hx: The patient's family history includes Cancer in his sister; Diabetes in his father; Heart attack in his mother; Heart disease in his mother; Hyperlipidemia in his sister; Hypertension in his father and sister; Stroke in his father.  ROS:   Please see the history of present illness.     All other systems reviewed and are negative.   Prior CV studies:   The following studies were reviewed today:    Labs/Other Tests and Data Reviewed:    EKG:  An ECG dated 05/04/18 was personally reviewed today and demonstrated:  sinus rhythm, ST changes anterior and inferior leads similar to prior  tracings  Recent Labs: No results found for requested labs within last 8760 hours.   Recent Lipid Panel Lab Results  Component Value Date/Time   CHOL 139 05/16/2014 11:15 AM   TRIG 111 05/16/2014 11:15 AM   HDL 49 05/16/2014 11:15 AM   CHOLHDL 2.8 05/16/2014 11:15 AM   LDLCALC 68 05/16/2014 11:15 AM    Wt Readings from Last 3 Encounters:  11/02/18 180 lb (81.6 kg)  05/04/18 197 lb 6.4 oz (89.5 kg)  04/01/17 194 lb (88 kg)     Objective:    Vital Signs:  BP 131/70   Pulse 71  Ht _0  (1.753 m)   Wt 180 lb (81.6 kg) Comment: 180 pounds  BMI 26.58 kg/m    VITAL SIGNS:  reviewed GEN:  no acute distress EYES:  sclerae anicteric, EOMI - Extraocular Movements Intact RESPIRATORY:  normal respiratory effort, symmetric expansion CARDIOVASCULAR:  no peripheral edema SKIN:  no rash, lesions or ulcers. MUSCULOSKELETAL:  no obvious deformities. NEURO:  alert and oriented x 3, no obvious focal deficit PSYCH:  normal affect  ASSESSMENT & PLAN:    CAD s/p multiple interventions - ASA and plavix - no anginal symptoms - he is walking less due to the pandemic, but is still push mowing his yard - continue DAPT   Hyperlipidemia with LDL goal < 70 - on lipitor and zetia - lipid profile checked by PCP in March, via [facility]: Total chol: 151 LDL: 84 HDL: 53 Triglycerides: 76  - LDL still not at goal - given his extensive disease, I will refer him to lipid clinic   HTN - home pressures - norvasc, lopressor, ramipril, and HCTZ - sCr 0.88, K 4.4 - pressure is well-controlled, no medication changes   Chronic diastolic heart failure - HCTZ - he appears euvolemic, no edema or orthopnea - no changes   OSA on CPAP - congratulated him on compliance   COVID-19 Education: The signs and symptoms of COVID-19 were discussed with the patient and how to seek care for testing (follow up with PCP or arrange E-visit).  The importance of social distancing was discussed today.  Time:    Today, I have spent 22 minutes with the patient with telehealth technology discussing the above problems.     Medication Adjustments/Labs and Tests Ordered: Current medicines are reviewed at length with the patient today.  Concerns regarding medicines are outlined above.   Tests Ordered: No orders of the defined types were placed in this encounter.   Medication Changes: No orders of the defined types were placed in this encounter.   Follow Up:  Virtual Visit or In Person in 6 month(s) with Dr. Claiborne Billings  Signed, [place], [place]  11/02/2018 9:10 AM    [place]

## 2018-11-02 ENCOUNTER — Telehealth: Payer: Medicare Other | Admitting: Cardiovascular Disease

## 2018-11-02 ENCOUNTER — Telehealth (INDEPENDENT_AMBULATORY_CARE_PROVIDER_SITE_OTHER): Payer: Medicare Other | Admitting: Physician Assistant

## 2018-11-02 VITALS — BP 131/70 | HR 71 | Ht 69.0 in | Wt 180.0 lb

## 2018-11-02 DIAGNOSIS — Z9989 Dependence on other enabling machines and devices: Secondary | ICD-10-CM

## 2018-11-02 DIAGNOSIS — I10 Essential (primary) hypertension: Secondary | ICD-10-CM

## 2018-11-02 DIAGNOSIS — E785 Hyperlipidemia, unspecified: Secondary | ICD-10-CM

## 2018-11-02 DIAGNOSIS — I2510 Atherosclerotic heart disease of native coronary artery without angina pectoris: Secondary | ICD-10-CM

## 2018-11-02 DIAGNOSIS — Z9861 Coronary angioplasty status: Secondary | ICD-10-CM

## 2018-11-02 DIAGNOSIS — G4733 Obstructive sleep apnea (adult) (pediatric): Secondary | ICD-10-CM | POA: Diagnosis not present

## 2018-11-02 DIAGNOSIS — I5032 Chronic diastolic (congestive) heart failure: Secondary | ICD-10-CM

## 2018-11-02 NOTE — Patient Instructions (Signed)
Medication Instructions:  Your physician recommends that you continue on your current medications as directed. Please refer to the Current Medication list given to you today.  If you need a refill on your cardiac medications before your next appointment, please call your pharmacy.    Follow-Up: At [facility], you and your health needs are our priority.  As part of our continuing mission to provide you with exceptional heart care, we have created designated Provider Care Teams.  These Care Teams include your primary Cardiologist (physician) and Advanced Practice Providers (APPs -  Physician Assistants and Nurse Practitioners) who all work together to provide you with the care you need, when you need it. You will need a follow up appointment in 6 months.  Please call our office 2 months in advance to schedule this appointment.  You may see Shelva Majestic, MD or one of the following Advanced Practice Providers on your designated Care Team: [place], [place] . Fabian Sharp, PA-C  You have been referred to our [place]. Someone from our office will call you to schedule this.

## 2018-11-17 ENCOUNTER — Other Ambulatory Visit: Payer: Self-pay | Admitting: Internal Medicine

## 2018-11-17 DIAGNOSIS — R634 Abnormal weight loss: Secondary | ICD-10-CM

## 2018-11-17 DIAGNOSIS — F17210 Nicotine dependence, cigarettes, uncomplicated: Secondary | ICD-10-CM

## 2018-11-18 ENCOUNTER — Other Ambulatory Visit: Payer: Medicare Other

## 2018-11-18 ENCOUNTER — Ambulatory Visit
Admission: RE | Admit: 2018-11-18 | Discharge: 2018-11-18 | Disposition: A | Discharge status: Home / Self Care | Payer: Medicare Other | Source: Ambulatory Visit | Attending: Internal Medicine | Admitting: Internal Medicine

## 2018-11-18 ENCOUNTER — Other Ambulatory Visit: Payer: Self-pay

## 2018-11-18 DIAGNOSIS — F17210 Nicotine dependence, cigarettes, uncomplicated: Secondary | ICD-10-CM

## 2018-11-18 DIAGNOSIS — R634 Abnormal weight loss: Secondary | ICD-10-CM

## 2018-11-18 MED ORDER — IOPAMIDOL (ISOVUE-300) INJECTION 61%
75.0000 mL | Freq: Once | INTRAVENOUS | Status: AC | PRN
  Administered 2018-11-18: 75 mL via INTRAVENOUS

## 2018-11-26 ENCOUNTER — Other Ambulatory Visit (HOSPITAL_COMMUNITY): Payer: Self-pay | Admitting: Internal Medicine

## 2018-11-26 ENCOUNTER — Other Ambulatory Visit: Payer: Self-pay | Admitting: Internal Medicine

## 2018-11-26 DIAGNOSIS — R918 Other nonspecific abnormal finding of lung field: Secondary | ICD-10-CM

## 2018-12-06 ENCOUNTER — Ambulatory Visit (HOSPITAL_COMMUNITY)
Admission: RE | Admit: 2018-12-06 | Discharge: 2018-12-06 | Disposition: A | Discharge status: Home / Self Care | Payer: Medicare Other | Source: Ambulatory Visit | Attending: Internal Medicine | Admitting: Internal Medicine

## 2018-12-06 ENCOUNTER — Other Ambulatory Visit: Payer: Self-pay

## 2018-12-06 DIAGNOSIS — J439 Emphysema, unspecified: Secondary | ICD-10-CM | POA: Diagnosis not present

## 2018-12-06 DIAGNOSIS — I700 Atherosclerosis of aorta: Secondary | ICD-10-CM | POA: Insufficient documentation

## 2018-12-06 DIAGNOSIS — R918 Other nonspecific abnormal finding of lung field: Secondary | ICD-10-CM | POA: Diagnosis not present

## 2018-12-06 DIAGNOSIS — I2510 Atherosclerotic heart disease of native coronary artery without angina pectoris: Secondary | ICD-10-CM | POA: Diagnosis not present

## 2018-12-06 DIAGNOSIS — M629 Disorder of muscle, unspecified: Secondary | ICD-10-CM | POA: Insufficient documentation

## 2018-12-06 DIAGNOSIS — E279 Disorder of adrenal gland, unspecified: Secondary | ICD-10-CM | POA: Diagnosis not present

## 2018-12-06 LAB — GLUCOSE, CAPILLARY: Glucose-Capillary: 115 mg/dL — ABNORMAL HIGH (ref 70–99)

## 2018-12-06 MED ORDER — FLUDEOXYGLUCOSE F - 18 (FDG) INJECTION
9.6000 | Freq: Once | INTRAVENOUS | Status: AC | PRN
Start: 2018-12-06 — End: 2018-12-06
  Administered 2018-12-06: 9.6 via INTRAVENOUS

## 2018-12-15 ENCOUNTER — Telehealth: Payer: Self-pay | Admitting: *Deleted

## 2018-12-15 DIAGNOSIS — R918 Other nonspecific abnormal finding of lung field: Secondary | ICD-10-CM

## 2018-12-15 NOTE — Telephone Encounter (Signed)
Oncology Nurse Navigator Documentation  Oncology Nurse Navigator Flowsheets 12/15/2018  Diagnosis Status Treatment Based on Clinical Diagnosis  Navigator Follow Up Date: 12/23/2018  Navigator Follow Up Reason: New [place] Appointment  Navigator Location [facility]  Referral Date to RadOnc/MedOnc 12/15/2018  Navigator Encounter Type Telephone/I received referral on Trevor Bailey today.  I called and scheduled him to be seen at [facility] 12/23/18.  He verbalized understanding of appt time and place.   Telephone Outgoing Call  Abnormal Finding Date 11/18/2018  Treatment Phase Abnormal Scans  Barriers/Navigation Needs Coordination of Care;Education  Education Other  Interventions Coordination of Care;Education  Coordination of Care Appts  Education Method Verbal  Acuity Level 2  Time Spent with Patient 30

## 2018-12-23 ENCOUNTER — Encounter: Payer: Self-pay | Admitting: Internal Medicine

## 2018-12-23 ENCOUNTER — Inpatient Hospital Stay (HOSPITAL_BASED_OUTPATIENT_CLINIC_OR_DEPARTMENT_OTHER): Payer: Medicare Other | Admitting: Internal Medicine

## 2018-12-23 ENCOUNTER — Inpatient Hospital Stay: Payer: Medicare Other | Attending: Internal Medicine

## 2018-12-23 ENCOUNTER — Encounter: Payer: Self-pay | Admitting: Thoracic Surgery (Cardiothoracic Vascular Surgery)

## 2018-12-23 ENCOUNTER — Other Ambulatory Visit: Payer: Self-pay | Admitting: Medical Oncology

## 2018-12-23 ENCOUNTER — Other Ambulatory Visit: Payer: Self-pay

## 2018-12-23 ENCOUNTER — Institutional Professional Consult (permissible substitution) (INDEPENDENT_AMBULATORY_CARE_PROVIDER_SITE_OTHER): Payer: Medicare Other | Admitting: Thoracic Surgery (Cardiothoracic Vascular Surgery)

## 2018-12-23 ENCOUNTER — Other Ambulatory Visit: Payer: Self-pay | Admitting: *Deleted

## 2018-12-23 VITALS — BP 151/77 | HR 71 | Temp 97.8°F | Resp 20 | Wt 190.6 lb

## 2018-12-23 DIAGNOSIS — Z79899 Other long term (current) drug therapy: Secondary | ICD-10-CM | POA: Diagnosis not present

## 2018-12-23 DIAGNOSIS — C3412 Malignant neoplasm of upper lobe, left bronchus or lung: Secondary | ICD-10-CM | POA: Insufficient documentation

## 2018-12-23 DIAGNOSIS — I10 Essential (primary) hypertension: Secondary | ICD-10-CM | POA: Insufficient documentation

## 2018-12-23 DIAGNOSIS — R599 Enlarged lymph nodes, unspecified: Secondary | ICD-10-CM | POA: Diagnosis not present

## 2018-12-23 DIAGNOSIS — J449 Chronic obstructive pulmonary disease, unspecified: Secondary | ICD-10-CM | POA: Insufficient documentation

## 2018-12-23 DIAGNOSIS — R918 Other nonspecific abnormal finding of lung field: Secondary | ICD-10-CM | POA: Diagnosis not present

## 2018-12-23 DIAGNOSIS — E279 Disorder of adrenal gland, unspecified: Secondary | ICD-10-CM | POA: Diagnosis not present

## 2018-12-23 DIAGNOSIS — R590 Localized enlarged lymph nodes: Secondary | ICD-10-CM | POA: Diagnosis not present

## 2018-12-23 DIAGNOSIS — I252 Old myocardial infarction: Secondary | ICD-10-CM | POA: Insufficient documentation

## 2018-12-23 DIAGNOSIS — I700 Atherosclerosis of aorta: Secondary | ICD-10-CM | POA: Insufficient documentation

## 2018-12-23 DIAGNOSIS — Z87891 Personal history of nicotine dependence: Secondary | ICD-10-CM

## 2018-12-23 DIAGNOSIS — E119 Type 2 diabetes mellitus without complications: Secondary | ICD-10-CM | POA: Insufficient documentation

## 2018-12-23 DIAGNOSIS — R911 Solitary pulmonary nodule: Secondary | ICD-10-CM | POA: Diagnosis not present

## 2018-12-23 DIAGNOSIS — I2510 Atherosclerotic heart disease of native coronary artery without angina pectoris: Secondary | ICD-10-CM

## 2018-12-23 DIAGNOSIS — Z720 Tobacco use: Secondary | ICD-10-CM

## 2018-12-23 LAB — CMP (CANCER CENTER ONLY)
ALT: 35 U/L (ref 0–44)
AST: 23 U/L (ref 15–41)
Albumin: 3.7 g/dL (ref 3.5–5.0)
Alkaline Phosphatase: 92 U/L (ref 38–126)
Anion gap: 12 (ref 5–15)
BUN: 20 mg/dL (ref 8–23)
CO2: 25 mmol/L (ref 22–32)
Calcium: 10.5 mg/dL — ABNORMAL HIGH (ref 8.9–10.3)
Chloride: 104 mmol/L (ref 98–111)
Creatinine: 1.1 mg/dL (ref 0.61–1.24)
GFR, Est AFR Am: >60 mL/min (ref >60)
GFR, Estimated: >60 mL/min (ref >60)
Glucose, Bld: 117 mg/dL — ABNORMAL HIGH (ref 70–99)
Potassium: 4.8 mmol/L (ref 3.5–5.1)
Sodium: 141 mmol/L (ref 135–145)
Total Bilirubin: 0.2 mg/dL — ABNORMAL LOW (ref 0.3–1.2)
Total Protein: 7.5 g/dL (ref 6.5–8.1)

## 2018-12-23 LAB — CBC WITH DIFFERENTIAL (CANCER CENTER ONLY)
Abs Immature Granulocytes: 0.03 10*3/uL (ref 0.00–0.07)
Basophils Absolute: 0.1 10*3/uL (ref 0.0–0.1)
Basophils Relative: 1 %
Eosinophils Absolute: 0.2 10*3/uL (ref 0.0–0.5)
Eosinophils Relative: 3 %
HCT: 40.4 % (ref 39.0–52.0)
Hemoglobin: 13.7 g/dL (ref 13.0–17.0)
Immature Granulocytes: 0 %
Lymphocytes Relative: 25 %
Lymphs Abs: 1.7 10*3/uL (ref 0.7–4.0)
MCH: 32.8 pg (ref 26.0–34.0)
MCHC: 33.9 g/dL (ref 30.0–36.0)
MCV: 96.7 fL (ref 80.0–100.0)
Monocytes Absolute: 0.8 10*3/uL (ref 0.1–1.0)
Monocytes Relative: 11 %
Neutro Abs: 4.1 10*3/uL (ref 1.7–7.7)
Neutrophils Relative %: 60 %
Platelet Count: 270 10*3/uL (ref 150–400)
RBC: 4.18 MIL/uL — ABNORMAL LOW (ref 4.22–5.81)
RDW: 12.1 % (ref 11.5–15.5)
WBC Count: 6.8 10*3/uL (ref 4.0–10.5)
nRBC: 0 % (ref 0.0–0.2)

## 2018-12-23 NOTE — Progress Notes (Signed)
[place] [phone]   [phone] Multidisciplinary thoracic oncology clinic  CONSULT NOTE  REFERRING PHYSICIAN: Dr. [name]  REASON FOR CONSULTATION:  73 years old African-American male with suspicious lung cancer.  HPI Trevor Bailey is a 73 y.o. male with past medical history significant for COPD, hypertension, dyslipidemia, diabetes mellitus, coronary artery disease status post myocardial infarction as well as long history of smoking.  The patient was seen by his primary care physician complaining of significant weight loss of around 18 pounds over 2 months.  He had CT scan of the chest performed on November 18, 2018 and that showed a lobulated 2.0 cm left upper lobe lesion worrisome for primary lung neoplasm.  There was no pathologically enlarged mediastinal or hilar lymph nodes but there was a indeterminate 2.0 cm right adrenal gland lesion concerning for metastasis.  A PET scan was performed on December 06, 2018 and that showed left supraclavicular lymph node measuring 1.9 x 2.6 cm with an SUV max of 13.2.  There were also 2 adjacent nodules in the posterior aspect of the left upper lobe measuring up to 1.0 x 1.4 cm with an SUV max of 4.3.  There was no hypermetabolic mediastinal, hilar or axillary lymph nodes.  The right adrenal nodule measured 1.6 x 2.6 cm with SUV max of 8.8.  There was also focal hypermetabolism seen in the musculature anterior to the right inferior pubic ramus where there was a mildly hyperdense 1.2 cm nodule with SUV max of 6.0.  The PET scan also showed hypermetabolic and nodular appearing prostate and malignancy was not excluded. Dr. Delfina Redwood kindly referred the patient to the multidisciplinary thoracic oncology clinic today for evaluation and recommendation regarding treatment of his condition. When seen today the patient is feeling fine with no concerning complaints.  He started gaining his weight back.  He denied having any chest  pain, shortness of breath, cough or hemoptysis.  He denied having any nausea, vomiting, diarrhea or constipation.  He has no headache or visual changes. Family history significant for mother died from heart attack, father had diabetes and stroke.  Sister had breast cancer. The patient is married and has 1 stepson.  He is currently retired and used to work for [employer].  He has a history for smoking 1 pack/day for around 60 years and quit 4 weeks ago.  He also drinks alcohol at regular basis.  He denied having any drug abuse.  HPI  Past Medical History:  Diagnosis Date   COPD (chronic obstructive pulmonary disease) ([place])    Coronary disease    Diabetes ([place])    Hyperlipidemia    Hypertension    Myocardial infarction [facility])     Past Surgical History:  Procedure Laterality Date   ABDOMINAL AORTAGRAM Left 09/16/2013   Procedure: ABDOMINAL Maxcine Ham;  Surgeon: Elam Dutch, MD;  Location: [facility] CATH LAB;  Service: Cardiovascular;  Laterality: Left;   CARDIAC CATHETERIZATION  01/2011   When there was segmental stenosis of the distal RCA, patent PCA stent, patent circumflex stent, and a patent small diagonal with 90% ISR.    CORONARY ANGIOPLASTY  may 2002   Non-DES stenting of his circumflex, non-DES stenting of the RCA   HYDROCELE EXCISION / REPAIR  2009   by Dr Janice Norrie   INGUINAL HERNIA REPAIR Bilateral    Dr Bubba Camp   NM MYOCAR PERF WALL MOTION  01/08/2011   moderate in size and intensity area of reversible  ischemia in the basal to mid inferior and septal territories. Abnormal study   stents  2008   proximal RCA, DES for progession of disease.   US ECHOCARDIOGRAPHY  01/12/2012   mild concentric LVH, borderline LA enlargement, mild to mod TR    Family History  Problem Relation Age of Onset   Heart attack Mother    Heart disease Mother    Diabetes Father    Stroke Father    Hypertension Father    Cancer Sister    Hyperlipidemia Sister     Hypertension Sister     Social History Social History   Tobacco Use   Smoking status: Former Smoker    Packs/day: 0.10    Years: 50.00    Pack years: 5.00    Types: Cigarettes   Smokeless tobacco: Never Used   Tobacco comment: pt states that he is cutting back on smoking  Substance Use Topics   Alcohol use: Yes    Alcohol/week: 0.0 standard drinks    Comment: moderately   Drug use: No    No Known Allergies  Current Outpatient Medications  Medication Sig Dispense Refill   amLODipine (NORVASC) 5 MG tablet Take 1 tablet (5 mg total) by mouth daily. 90 tablet 3   aspirin EC 81 MG tablet Take 81 mg by mouth daily.     atorvastatin (LIPITOR) 80 MG tablet Take 1 tablet (80 mg total) by mouth daily. 90 tablet 3   BREO ELLIPTA 100-25 MCG/INH AEPB Inhale 1 puff into the lungs 2 (two) times daily.     CLARITIN 10 MG tablet Take 1 tablet by mouth daily.     clopidogrel (PLAVIX) 75 MG tablet Take 1 tablet by mouth daily.     finasteride (PROSCAR) 5 MG tablet Take 1 tablet by mouth daily.     Fluticasone Furoate-Vilanterol (BREO ELLIPTA) 100-25 MCG/INH AEPB Inhale 1 puff into the lungs daily.     hydrochlorothiazide (HYDRODIURIL) 25 MG tablet Take 1 tablet by mouth daily.     latanoprost (XALATAN) 0.005 % ophthalmic solution      metFORMIN (GLUCOPHAGE) 1000 MG tablet Take 1 tablet by mouth 2 (two) times daily.     metoprolol tartrate (LOPRESSOR) 25 MG tablet Take 25 mg 2 (two) times daily by mouth.     nitroGLYCERIN (NITROSTAT) 0.4 MG SL tablet Place 1 tablet (0.4 mg total) under the tongue every 5 (five) minutes as needed for chest pain. 25 tablet 6   pantoprazole (PROTONIX) 40 MG tablet Take 40 mg daily by mouth.     ramipril (ALTACE) 10 MG capsule Take 1 capsule by mouth 2 (two) times daily.     VENTOLIN HFA 108 (90 Base) MCG/ACT inhaler   0   ZETIA 10 MG tablet Take 1 tablet by mouth daily.     No current facility-administered medications for this visit.      Review of Systems  Constitutional: positive for fatigue Eyes: negative Ears, nose, mouth, throat, and face: negative Respiratory: negative Cardiovascular: negative Gastrointestinal: negative Genitourinary:negative Integument/breast: negative Hematologic/lymphatic: negative Musculoskeletal:negative Neurological: negative Behavioral/Psych: negative Endocrine: negative Allergic/Immunologic: negative  Physical Exam  WRU:EAVWU, healthy, no distress, well nourished and well developed SKIN: skin color, texture, turgor are normal, no rashes or significant lesions HEAD: Normocephalic, No masses, lesions, tenderness or abnormalities EYES: normal, PERRLA, Conjunctiva are pink and non-injected EARS: External ears normal, Canals clear OROPHARYNX:no exudate, no erythema and lips, buccal mucosa, and tongue normal  NECK: supple, no adenopathy, no JVD LYMPH:  no  palpable lymphadenopathy, no hepatosplenomegaly LUNGS: clear to auscultation , and palpation HEART: regular rate & rhythm, no murmurs and no gallops ABDOMEN:abdomen soft, non-tender, normal bowel sounds and no masses or organomegaly BACK: Back symmetric, no curvature., No CVA tenderness EXTREMITIES:no joint deformities, effusion, or inflammation, no edema  NEURO: alert & oriented x 3 with fluent speech, no focal motor/sensory deficits  PERFORMANCE STATUS: ECOG 1  LABORATORY DATA: Lab Results  Component Value Date   WBC 6.8 12/23/2018   HGB 13.7 12/23/2018   HCT 40.4 12/23/2018   MCV 96.7 12/23/2018   PLT 270 12/23/2018      Chemistry      Component Value Date/Time   NA 141 05/16/2014 1109   K 4.1 05/16/2014 1109   CL 104 05/16/2014 1109   CO2 30 05/16/2014 1109   BUN 11 05/16/2014 1109   CREATININE 0.94 05/16/2014 1109      Component Value Date/Time   CALCIUM 9.7 05/16/2014 1109   ALKPHOS 76 05/16/2014 1109   AST 16 05/16/2014 1109   ALT 17 05/16/2014 1109   BILITOT 0.5 05/16/2014 1109       RADIOGRAPHIC  STUDIES: Nm Pet Image Initial (pi) Skull Base To Thigh  Result Date: 12/06/2018 CLINICAL DATA:  Initial treatment strategy for lung mass. EXAM: NUCLEAR MEDICINE PET SKULL BASE TO THIGH TECHNIQUE: 9.6 mCi F-18 FDG was injected intravenously. Full-ring PET imaging was performed from the skull base to thigh after the radiotracer. CT data was obtained and used for attenuation correction and anatomic localization. Fasting blood glucose: 115 mg/dl COMPARISON:  CT chest 11/18/2018 and CT abdomen pelvis 01/24/2015. FINDINGS: Mediastinal blood pool activity: SUV max 2.7 Liver activity: SUV max NA NECK: No hypermetabolic lymph nodes. Incidental CT findings: None. CHEST: Left supraclavicular lymph node measures 1.9 x 2.6 cm (CT image 41) with an SUV max 13.2. 2 adjacent nodules in the posterior aspect of left upper lobe measure up to 1.0 x 1.4 cm (series 8, image 20) with an SUV max of 4.3. No hypermetabolic mediastinal, hilar or axillary lymph nodes. Incidental CT findings: Atherosclerotic calcification of the aorta, aortic valve and coronary arteries. No pericardial or pleural effusion. Centrilobular emphysema. ABDOMEN/PELVIS: Right adrenal nodule measures 1.6 x 2.6 cm with an SUV max of 8.8. No additional abnormal hypermetabolism in the liver, left adrenal gland, spleen or pancreas. No hypermetabolic lymph nodes. Focal hypermetabolism is seen in the musculature anterior to the right inferior pubic ramus, where there is likely a mildly hyperdense 1.2 cm nodule (CT image 189) with an SUV max of 6.0. Finally, there is intense hypermetabolic activity within a partially calcified and nodular appearing prostate. Incidental CT findings: Low-attenuation lesions in the liver are similar to 01/24/2015 and are likely cysts. Liver, gallbladder and left adrenal gland are otherwise unremarkable. Fluid density 2.6 cm low-attenuation lesion in the right kidney, similar and likely a cyst. Vague low-attenuation lesion in the lower pole  right kidney also similar to 01/24/2015. Subcentimeter low-attenuation lesion in the interpolar left kidney, too small to characterize. Spleen, pancreas, stomach and bowel are grossly unremarkable. Atherosclerotic calcification of the aorta. SKELETON: No abnormal osseous hypermetabolism. Incidental CT findings: Degenerative changes in the spine. 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 IMPRESSION: 1. Hypermetabolic left upper lobe nodules, left supraclavicular lymph node, right adrenal mass and muscular lesion along the right inferior pubic ramus. Distribution of findings is suspicious for melanoma. 2. Hypermetabolic and nodular appearing prostate. Malignancy is not excluded. 3. Aortic atherosclerosis (ICD10-170.0). Coronary  artery calcification. 4.  Emphysema (ICD10-J43.9). Electronically Signed   By: Lorin Picket M.D.   On: 12/06/2018 09:45    ASSESSMENT: This is a very pleasant 73 years old African-American male with suspicious metastatic lung cancer presenting with left upper lobe lung nodule in addition to left supraclavicular lymphadenopathy and suspicious metastasis to the right adrenal gland and muscular lesion along the right inferior pubic ramus.   PLAN: I had a lengthy discussion with the patient today about his current condition and further investigation to confirm the diagnosis and treatment options. I personally and independently reviewed the scan images and discussed the result and showed the images to the patient today. I recommended for the patient to consider either excisional biopsy or CT-guided core biopsy of the left supraclavicular lymph node for tissue diagnosis.  The patient will be seen by Dr. Kipp Brood later today for evaluation and recommendation regarding the biopsy. I will arrange for him to come back for follow-up visit in less than 2 weeks for evaluation and more detailed discussion of his treatment options based on the biopsy results.  If the  biopsy is consistent with non-small cell lung cancer, we will order MRI of the brain to complete the staging work-up. For smoking cessation I strongly encouraged the patient to continue quitting smoking. He was advised to call immediately if he has any concerning symptoms in the interval.  The patient voices understanding of current disease status and treatment options and is in agreement with the current care plan.  All questions were answered. The patient knows to call the clinic with any problems, questions or concerns. We can certainly see the patient much sooner if necessary.  Thank you so much for allowing me to participate in the care of Trevor Bailey. I will continue to follow up the patient with you and assist in his care.  I spent 40 minutes counseling the patient face to face. The total time spent in the appointment was 60 minutes.  Disclaimer: This note was dictated with voice recognition software. Similar sounding words can inadvertently be transcribed and may not be corrected upon review.   Eilleen Kempf December 23, 2018, 2:59 PM

## 2018-12-23 NOTE — Progress Notes (Signed)
[address]       [place] [address]             [phone]                    Dhanush J Derrig [place] Medical Record #[id number] Date of Birth: 01-27-1946  Referring: [name], MD Primary Care: [name], MD Primary Cardiologist: Shelva Majestic, MD  Chief Complaint:   No chief complaint on file.   History of Present Illness:    Trevor Bailey 73 y.o. male is seen in the multidisciplinary clinic for evaluation of a 2cm left upper lone pulmonary nodule.  He also has a 2cm right adrenal gland lesion.  On PET/CT, in addition to the avidity in the pulmonary and adrenal nodule, he also exhibited SUV uptake in a left supraclavicular lymph node, and a nodule anterior to the right inferior pubic ramus.    He originally presented to his PCP with the complaint of ~20lbs weight loss.  He has been cigarette free for the last 4 weeks.  He has no other complaints today.       Current Activity/ Functional Status:  Patient is independent with mobility/ambulation, transfers, ADL's, IADL's.   Zubrod Score: At the time of surgery this patients most appropriate activity status/level should be described as: [x]     0    Normal activity, no symptoms []     1    Restricted in physical strenuous activity but ambulatory, able to do out light work []     2    Ambulatory and capable of self care, unable to do work activities, up and about               >50 % of waking hours                              []     3    Only limited self care, in bed greater than 50% of waking hours []     4    Completely disabled, no self care, confined to bed or chair []     5    Moribund   Past Medical History:  Diagnosis Date   COPD (chronic obstructive pulmonary disease) ([place])    Coronary disease    Diabetes ([place])    Hyperlipidemia    Hypertension    Myocardial infarction [facility])     Past Surgical History:  Procedure Laterality Date   ABDOMINAL AORTAGRAM Left 09/16/2013   Procedure: ABDOMINAL Maxcine Ham;  Surgeon: Elam Dutch, MD;  Location: [facility] CATH LAB;  Service: Cardiovascular;  Laterality: Left;   CARDIAC CATHETERIZATION  01/2011   When there was segmental stenosis of the distal RCA, patent PCA stent, patent circumflex stent, and a patent small diagonal with 90% ISR.    CORONARY ANGIOPLASTY  may 2002   Non-DES stenting of his circumflex, non-DES stenting of the RCA   HYDROCELE EXCISION / REPAIR  2009   by Dr Janice Norrie   INGUINAL HERNIA REPAIR Bilateral    Dr Bubba Camp   NM MYOCAR PERF WALL MOTION  01/08/2011   moderate in size and intensity area of reversible ischemia in the basal to mid inferior and septal territories. Abnormal study   stents  2008   proximal RCA, DES for progession of disease.   US ECHOCARDIOGRAPHY  01/12/2012   mild concentric LVH, borderline LA enlargement, mild to mod  TR    Family History  Problem Relation Age of Onset   Heart attack Mother    Heart disease Mother    Diabetes Father    Stroke Father    Hypertension Father    Cancer Sister    Hyperlipidemia Sister    Hypertension Sister      Social History   Tobacco Use  Smoking Status Former Smoker   Packs/day: 0.10   Years: 50.00   Pack years: 5.00   Types: Cigarettes  Smokeless Tobacco Never Used  Tobacco Comment   pt states that he is cutting back on smoking    Social History   Substance and Sexual Activity  Alcohol Use Yes   Alcohol/week: 0.0 standard drinks   Comment: moderately     No Known Allergies  Current Outpatient Medications  Medication Sig Dispense Refill   amLODipine (NORVASC) 5 MG tablet Take 1 tablet (5 mg total) by mouth daily. 90 tablet 3   aspirin EC 81 MG tablet Take 81 mg by mouth daily.     atorvastatin (LIPITOR) 80 MG tablet Take 1 tablet (80 mg total) by mouth daily. 90 tablet 3   BREO ELLIPTA 100-25 MCG/INH AEPB Inhale 1 puff into the lungs 2 (two) times daily.     CLARITIN 10 MG tablet Take 1 tablet by  mouth daily.     clopidogrel (PLAVIX) 75 MG tablet Take 1 tablet by mouth daily.     finasteride (PROSCAR) 5 MG tablet Take 1 tablet by mouth daily.     Fluticasone Furoate-Vilanterol (BREO ELLIPTA) 100-25 MCG/INH AEPB Inhale 1 puff into the lungs daily.     hydrochlorothiazide (HYDRODIURIL) 25 MG tablet Take 1 tablet by mouth daily.     latanoprost (XALATAN) 0.005 % ophthalmic solution      metFORMIN (GLUCOPHAGE) 1000 MG tablet Take 1 tablet by mouth 2 (two) times daily.     metoprolol tartrate (LOPRESSOR) 25 MG tablet Take 25 mg 2 (two) times daily by mouth.     nitroGLYCERIN (NITROSTAT) 0.4 MG SL tablet Place 1 tablet (0.4 mg total) under the tongue every 5 (five) minutes as needed for chest pain. (Patient not taking: Reported on 12/23/2018) 25 tablet 6   pantoprazole (PROTONIX) 40 MG tablet Take 40 mg daily by mouth.     ramipril (ALTACE) 10 MG capsule Take 1 capsule by mouth 2 (two) times daily.     VENTOLIN HFA 108 (90 Base) MCG/ACT inhaler   0   ZETIA 10 MG tablet Take 1 tablet by mouth daily.     No current facility-administered medications for this visit.      Review of Systems:  Constitutional: weight loss.  Denies fevers or chills Cardiovascular: denies chest pain or shortness of breath Pulmonary: denies SOB, no cough GI: negative MSK: negative Neuro: negative   PHYSICAL EXAMINATION: There were no vitals taken for this visit. General: non-toxic, appears  stated age.   HEENT: NCAT.  Moist mucous membranes, Oropharynx clear.  no cervical adenopathy.  The left supraclavicular lymph node is palpable.  It is most prominent when the left arm is down.   Neuro: Alert, Oriented X 3. Non focal. Psych: appropriate mood. Cardiovascular: RRR, no murmur, good peripheral pulses Pulmonary:  Coarse BS bilaterally GI: soft, ND, NT MSK: ambulates wells.   Diagnostic Studies & Laboratory data:     Recent Radiology Findings:   Nm Pet Image Initial (pi) Skull Base To  Thigh  Result Date: 12/06/2018 CLINICAL DATA:  Initial treatment  strategy for lung mass. EXAM: NUCLEAR MEDICINE PET SKULL BASE TO THIGH TECHNIQUE: 9.6 mCi F-18 FDG was injected intravenously. Full-ring PET imaging was performed from the skull base to thigh after the radiotracer. CT data was obtained and used for attenuation correction and anatomic localization. Fasting blood glucose: 115 mg/dl COMPARISON:  CT chest 11/18/2018 and CT abdomen pelvis 01/24/2015. FINDINGS: Mediastinal blood pool activity: SUV max 2.7 Liver activity: SUV max NA NECK: No hypermetabolic lymph nodes. Incidental CT findings: None. CHEST: Left supraclavicular lymph node measures 1.9 x 2.6 cm (CT image 41) with an SUV max 13.2. 2 adjacent nodules in the posterior aspect of left upper lobe measure up to 1.0 x 1.4 cm (series 8, image 20) with an SUV max of 4.3. No hypermetabolic mediastinal, hilar or axillary lymph nodes. Incidental CT findings: Atherosclerotic calcification of the aorta, aortic valve and coronary arteries. No pericardial or pleural effusion. Centrilobular emphysema. ABDOMEN/PELVIS: Right adrenal nodule measures 1.6 x 2.6 cm with an SUV max of 8.8. No additional abnormal hypermetabolism in the liver, left adrenal gland, spleen or pancreas. No hypermetabolic lymph nodes. Focal hypermetabolism is seen in the musculature anterior to the right inferior pubic ramus, where there is likely a mildly hyperdense 1.2 cm nodule (CT image 189) with an SUV max of 6.0. Finally, there is intense hypermetabolic activity within a partially calcified and nodular appearing prostate. Incidental CT findings: Low-attenuation lesions in the liver are similar to 01/24/2015 and are likely cysts. Liver, gallbladder and left adrenal gland are otherwise unremarkable. Fluid density 2.6 cm low-attenuation lesion in the right kidney, similar and likely a cyst. Vague low-attenuation lesion in the lower pole right kidney also similar to 01/24/2015.  Subcentimeter low-attenuation lesion in the interpolar left kidney, too small to characterize. Spleen, pancreas, stomach and bowel are grossly unremarkable. Atherosclerotic calcification of the aorta. SKELETON: No abnormal osseous hypermetabolism. Incidental CT findings: Degenerative changes in the spine. 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 IMPRESSION: 1. Hypermetabolic left upper lobe nodules, left supraclavicular lymph node, right adrenal mass and muscular lesion along the right inferior pubic ramus. Distribution of findings is suspicious for melanoma. 2. Hypermetabolic and nodular appearing prostate. Malignancy is not excluded. 3. Aortic atherosclerosis (ICD10-170.0). Coronary artery calcification. 4.  Emphysema (ICD10-J43.9). Electronically Signed   By: Lorin Picket M.D.   On: 12/06/2018 09:45       I have independently reviewed the above radiology studies  and reviewed the findings with the patient.   Recent Lab Findings: Lab Results  Component Value Date   WBC 6.8 12/23/2018   HGB 13.7 12/23/2018   HCT 40.4 12/23/2018   PLT 270 12/23/2018   GLUCOSE 117 (H) 12/23/2018   CHOL 139 05/16/2014   TRIG 111 05/16/2014   HDL 49 05/16/2014   LDLCALC 68 05/16/2014   ALT 35 12/23/2018   AST 23 12/23/2018   NA 141 12/23/2018   K 4.8 12/23/2018   CL 104 12/23/2018   CREATININE 1.10 12/23/2018   BUN 20 12/23/2018   CO2 25 12/23/2018   TSH 0.621 06/29/2013   INR 1.0 08/31/2007       Assessment / Plan:   73 yo male with left upper lobe pulmonary nodule, and extra-thoracic PET Avidity concerning for metastatic disease.  He also has a history of coronary artery disease, and is currently on plavix.  I have recommended that he undergo an excisional biopsy of the left supraclavicular lymph node, which is palpable on exam.  (appears deep  on PET, due to arms up.  When lowered the nodule raises above the clavicle).  Risks, benefits and alternatives have been  discussed and he is agreeable to proceed.  We will touch base with his cardiologist Dr. Claiborne Billings, and he will need a plavix washout.       I  spent 30 minutes with  the patient face to face and greater then 50% of the time was spent in counseling and coordination of care.    [name] 12/23/2018 4:23 PM

## 2018-12-23 NOTE — Progress Notes (Signed)
The proposed treatment discussed in cancer conference 12/23/2018 is for discussion purpose only and is not a binding recommendation.  The patient was not physically examined nor present for their treatment options.  Therefore, final treatment plans cannot be decided.

## 2018-12-24 ENCOUNTER — Telehealth: Payer: Self-pay | Admitting: Internal Medicine

## 2018-12-24 ENCOUNTER — Encounter: Payer: Self-pay | Admitting: *Deleted

## 2018-12-24 NOTE — Progress Notes (Signed)
Oncology Nurse Navigator Documentation  Oncology Nurse Navigator Flowsheets 12/24/2018  Diagnosis Status -  Navigator Follow Up Date: 12/28/2018  Navigator Follow Up Reason: Review Note  Navigator Location [facility]  Referral Date to RadOnc/MedOnc -  Navigator Encounter Type Clinic/MDC/I spoke with Mr. Fuhrmann yesterday at thoracic clinic.  He will be scheduled for a biopsy and then will be back to visit Dr. Julien Nordmann for a plan of care.  He verbalized understanding of next steps.  I faxed Dr. Abran Duke orders to [facility] with confirmation fax obtained.   Telephone -  Abnormal Finding Date -  Treatment Initiated Date 12/23/2018  Patient Visit Type MedOnc  Treatment Phase Abnormal Scans  Barriers/Navigation Needs Coordination of Care;Education  Education Other  Interventions Coordination of Care;Education  Coordination of Care Other  Education Method Verbal  Acuity Level 2  Time Spent with Patient 30

## 2018-12-24 NOTE — Telephone Encounter (Signed)
No 8/06 LOS

## 2018-12-30 ENCOUNTER — Other Ambulatory Visit: Payer: Self-pay

## 2018-12-30 ENCOUNTER — Encounter: Payer: Self-pay | Admitting: *Deleted

## 2018-12-30 DIAGNOSIS — R918 Other nonspecific abnormal finding of lung field: Secondary | ICD-10-CM

## 2018-12-30 NOTE — Progress Notes (Signed)
Oncology Nurse Navigator Documentation  Oncology Nurse Navigator Flowsheets 12/30/2018  Diagnosis Status -  Navigator Follow Up Date: -  Navigator Follow Up Reason: -  Navigator Location [facility]  Referral Date to RadOnc/MedOnc -  Navigator Encounter Type Other/I followed up on Trevor Bailey schedule and noticed his biopsy has not been scheduled yet.  The biopsy was not ordered.  I followed up with Dr. Julien Nordmann and he gave me verbal order for US biopsy.  Completed   Telephone -  Abnormal Finding Date -  Treatment Initiated Date -  Patient Visit Type -  Treatment Phase Abnormal Scans  Barriers/Navigation Needs Coordination of Care;Education  Education Other  Interventions Coordination of Care;Education  Coordination of Care Other  Education Method -  Acuity Level 2  Time Spent with Patient 30

## 2018-12-30 NOTE — Progress Notes (Signed)
I received an update from Dr. Julien Nordmann.  Patient is needing to be set up for biopsy with Dr. Kipp Brood.  I reached out to his office regarding biopsy.

## 2018-12-31 ENCOUNTER — Other Ambulatory Visit: Payer: Self-pay

## 2018-12-31 DIAGNOSIS — R591 Generalized enlarged lymph nodes: Secondary | ICD-10-CM

## 2018-12-31 DIAGNOSIS — R599 Enlarged lymph nodes, unspecified: Secondary | ICD-10-CM

## 2019-01-04 ENCOUNTER — Telehealth: Payer: Self-pay | Admitting: Internal Medicine

## 2019-01-04 ENCOUNTER — Encounter: Payer: Self-pay | Admitting: *Deleted

## 2019-01-04 NOTE — Telephone Encounter (Signed)
Scheduled appt per 8/18 sch message - unable to reach pt . Left message with appt date and time

## 2019-01-04 NOTE — Progress Notes (Signed)
Oncology Nurse Navigator Documentation  Oncology Nurse Navigator Flowsheets 01/04/2019  Diagnosis Status -  Navigator Follow Up Date: -  Navigator Follow Up Reason: -  Navigator Location [facility]  Referral Date to RadOnc/MedOnc -  Navigator Encounter Type Other/I followed up on Trevor Bailey's scheduled.  I updated Dr. Julien Nordmann. He would like to see patient a few days after biopsy.  I completed scheduling message for him to be seen on 8/27 with Cassie PA per Dr. Julien Nordmann.    Telephone -  Abnormal Finding Date -  Treatment Initiated Date -  Patient Visit Type -  Treatment Phase Abnormal Scans  Barriers/Navigation Needs Coordination of Care  Education Other  Interventions Coordination of Care  Coordination of Care Other  Education Method -  Acuity Level 2  Time Spent with Patient 30

## 2019-01-06 ENCOUNTER — Other Ambulatory Visit (HOSPITAL_COMMUNITY)
Admission: RE | Admit: 2019-01-06 | Discharge: 2019-01-06 | Disposition: A | Discharge status: Home / Self Care | Payer: Medicare Other | Source: Ambulatory Visit | Attending: Thoracic Surgery (Cardiothoracic Vascular Surgery) | Admitting: Thoracic Surgery (Cardiothoracic Vascular Surgery)

## 2019-01-06 ENCOUNTER — Encounter (HOSPITAL_COMMUNITY)
Admission: RE | Admit: 2019-01-06 | Discharge: 2019-01-06 | Disposition: A | Discharge status: Home / Self Care | Payer: Medicare Other | Source: Ambulatory Visit | Attending: Thoracic Surgery (Cardiothoracic Vascular Surgery) | Admitting: Thoracic Surgery (Cardiothoracic Vascular Surgery)

## 2019-01-06 ENCOUNTER — Other Ambulatory Visit: Payer: Self-pay

## 2019-01-06 ENCOUNTER — Encounter (HOSPITAL_COMMUNITY): Payer: Self-pay

## 2019-01-06 DIAGNOSIS — Z01812 Encounter for preprocedural laboratory examination: Secondary | ICD-10-CM | POA: Insufficient documentation

## 2019-01-06 DIAGNOSIS — R591 Generalized enlarged lymph nodes: Secondary | ICD-10-CM

## 2019-01-06 DIAGNOSIS — R599 Enlarged lymph nodes, unspecified: Secondary | ICD-10-CM

## 2019-01-06 DIAGNOSIS — Z20828 Contact with and (suspected) exposure to other viral communicable diseases: Secondary | ICD-10-CM | POA: Insufficient documentation

## 2019-01-06 HISTORY — DX: Allergy, unspecified, initial encounter: T78.40XA

## 2019-01-06 HISTORY — DX: Benign prostatic hyperplasia without lower urinary tract symptoms: N40.0

## 2019-01-06 HISTORY — DX: Unspecified glaucoma: H40.9

## 2019-01-06 HISTORY — DX: Enlarged lymph nodes, unspecified: R59.9

## 2019-01-06 HISTORY — DX: Gastro-esophageal reflux disease without esophagitis: K21.9

## 2019-01-06 HISTORY — DX: Sleep apnea, unspecified: G47.30

## 2019-01-06 HISTORY — DX: Presence of dental prosthetic device (complete) (partial): Z97.2

## 2019-01-06 LAB — CBC
HCT: 40.3 % (ref 39.0–52.0)
Hemoglobin: 13 g/dL (ref 13.0–17.0)
MCH: 32.3 pg (ref 26.0–34.0)
MCHC: 32.3 g/dL (ref 30.0–36.0)
MCV: 100 fL (ref 80.0–100.0)
Platelets: 272 10*3/uL (ref 150–400)
RBC: 4.03 MIL/uL — ABNORMAL LOW (ref 4.22–5.81)
RDW: 12.5 % (ref 11.5–15.5)
WBC: 6 10*3/uL (ref 4.0–10.5)
nRBC: 0 % (ref 0.0–0.2)

## 2019-01-06 LAB — COMPREHENSIVE METABOLIC PANEL
ALT: 37 U/L (ref 0–44)
AST: 28 U/L (ref 15–41)
Albumin: 3.9 g/dL (ref 3.5–5.0)
Alkaline Phosphatase: 83 U/L (ref 38–126)
Anion gap: 12 (ref 5–15)
BUN: 20 mg/dL (ref 8–23)
CO2: 24 mmol/L (ref 22–32)
Calcium: 9.8 mg/dL (ref 8.9–10.3)
Chloride: 103 mmol/L (ref 98–111)
Creatinine, Ser: 1.02 mg/dL (ref 0.61–1.24)
GFR calc Af Amer: >60 mL/min (ref >60)
GFR calc non Af Amer: >60 mL/min (ref >60)
Glucose, Bld: 90 mg/dL (ref 70–99)
Potassium: 4.3 mmol/L (ref 3.5–5.1)
Sodium: 139 mmol/L (ref 135–145)
Total Bilirubin: 0.8 mg/dL (ref 0.3–1.2)
Total Protein: 7.1 g/dL (ref 6.5–8.1)

## 2019-01-06 LAB — APTT: aPTT: 29 seconds (ref 24–36)

## 2019-01-06 LAB — SARS CORONAVIRUS 2 (TAT 6-24 HRS): SARS Coronavirus 2: NEGATIVE

## 2019-01-06 LAB — PROTIME-INR
INR: 1.1 (ref 0.8–1.2)
Prothrombin Time: 13.8 seconds (ref 11.4–15.2)

## 2019-01-06 LAB — HEMOGLOBIN A1C
Hgb A1c MFr Bld: 6.6 % — ABNORMAL HIGH (ref 4.8–5.6)
Mean Plasma Glucose: 142.72 mg/dL

## 2019-01-06 LAB — GLUCOSE, CAPILLARY: Glucose-Capillary: 100 mg/dL — ABNORMAL HIGH (ref 70–99)

## 2019-01-06 NOTE — Progress Notes (Signed)
Pt denies SOB and chest pain.  PCP - Dr. [name] Cardiologist - Dr. Kathleen Lime- denies A1c in last 60 days; last labs 12/23/18 Chest x-ray - DOS ( 72 hours) EKG -05/04/2018 Stress Test - 12/2008 ECHO - 12/2011 Cardiac Cath - 01/2011 CPAP - wears CPAP Checks Blood Sugar  daily  Blood Thinner Instructions: " last dose of Plavix 01/04/19 " Aspirin Instructions: continue; do not take DOS per Thurmond Butts, RN  Anesthesia review: yes; cardiac history  Coronavirus Screening  Have you experienced the following symptoms:  Cough yes/no: No Fever (>100.14F)  yes/no: No Runny nose yes/no: No Sore throat yes/no: No Difficulty breathing/shortness of breath  yes/no: No  Have you or a family member traveled in the last 14 days and where? yes/no: No  Pt reminded that hospital visitation restrictions are in effect and the importance of the restrictions.  Patient verbalized understanding of all pre-op  Instructions.

## 2019-01-06 NOTE — Pre-Procedure Instructions (Signed)
Trevor Bailey  01/06/2019     [facility], [place] [address] [place] [place] [address] Phone: [phone] Fax: [phone]  [place], [place] [facility] [address] [place] [address] Phone: [phone] Fax: [phone]   Your procedure is scheduled on Monday, January 10, 2019  Report to [place] at 5:30 A.M.  Call this number if you have problems the morning of surgery:  [phone]   Remember: Brush your teeth the morning of surgery with you regular toothpaste.   Do not eat or drink after midnight Sunday, January 09, 2019    Take these medicines the morning of surgery with A SIP OF WATER : amLODipine (NORVASC),  CLARITIN,  finasteride (PROSCAR),   metoprolol tartrate (LOPRESSOR),  pantoprazole (PROTONIX), Fluticasone Furoate-Vilanterol (BREO ELLIPTA) inhaler If needed: Systane eye drops for dry eyes If needed: nitroGLYCERIN (NITROSTAT) for chest pain If needed:VENTOLIN HFA 108 (90 Base)  Inhaler ( bring inhaler in with you )  Stop taking  vitamins, fish oil and herbal medications. Do not take any NSAIDs ie: Ibuprofen, Advil, Naproxen (Aleve), Motrin, BC and Goody Powder; stop now.  Follow surgeon's instructions regarding Aspirin and clopidogrel (PLAVIX); if no pre-op instructions were provided, call surgeon's office.    How to Manage Your Diabetes Before and After Surgery  Why is it important to control my blood sugar before and after surgery? . Improving blood sugar levels before and after surgery helps healing and can limit problems. . A way of improving blood sugar control is eating a healthy diet by: o  Eating less sugar and carbohydrates o  Increasing activity/exercise o  Talking with your doctor about reaching your blood sugar goals . High blood sugars (greater than 180 mg/dL) can raise your  risk of infections and slow your recovery, so you will need to focus on controlling your diabetes during the weeks before surgery. . Make sure that the doctor who takes care of your diabetes knows about your planned surgery including the date and location.  How do I manage my blood sugar before surgery? . Check your blood sugar at least 4 times a day, starting 2 days before surgery, to make sure that the level is not too high or low. o Check your blood sugar the morning of your surgery when you wake up and every 2 hours until you get to the Short Stay unit. . If your blood sugar is less than 70 mg/dL, you will need to treat for low blood sugar: o Treat a low blood sugar (less than 70 mg/dL) with  cup of clear juice (cranberry or apple), 4 glucose tablets, OR glucose gel. Recheck blood sugar in 15 minutes after treatment (to make sure it is greater than 70 mg/dL). If your blood sugar is not greater than 70 mg/dL on recheck, call [phone] o  for further instructions. . Report your blood sugar to the short stay nurse when you get to Short Stay.  . If you are admitted to the hospital after surgery: o Your blood sugar will be checked by the staff and you will probably be given insulin after surgery (instead of oral diabetes medicines) to make sure you have good blood sugar levels. o The goal for blood sugar control after surgery is 80-180 mg/dL.  WHAT DO I DO ABOUT MY DIABETES MEDICATION?  Marland Kitchen Do not take  oral diabetes medicines (pills) the morning of surgery such as metFORMIN (GLUCOPHAGE)  Reviewed and Endorsed by [facility] Patient Education Committee, August 2015  Do not wear jewelry, make-up or nail polish.  Do not wear lotions, powders, or perfumes, or deodorant.  Do not shave 48 hours prior to surgery.  Men may shave face and neck.  Do not bring valuables to the hospital.  [facility] is not responsible for any belongings or valuables.  Contacts, dentures or bridgework may not be worn  into surgery.   Patients discharged the day of surgery will not be allowed to drive home.   Special instructions: Shower the night before surgery and the morning of surgery with CHG; see " [facility] Preparing For Surgery " sheet. Please read over the following fact sheets that you were given. Pain Booklet, Coughing and Deep Breathing and Surgical Site Infection Prevention

## 2019-01-07 NOTE — Anesthesia Preprocedure Evaluation (Addendum)
Anesthesia Evaluation  Patient identified by MRN, date of birth, ID band Patient awake    Reviewed: Allergy & Precautions, NPO status , Patient's Chart, lab work & pertinent test results, reviewed documented beta blocker date and time   History of Anesthesia Complications Negative for: history of anesthetic complications  Airway Mallampati: II  TM Distance: >3 FB Neck ROM: Full    Dental  (+) Partial Lower, Partial Upper, Dental Advisory Given   Pulmonary sleep apnea and Continuous Positive Airway Pressure Ventilation , COPD,  COPD inhaler, former smoker,    Pulmonary exam normal        Cardiovascular hypertension, Pt. on medications and Pt. on home beta blockers (-) angina+ CAD, + Past MI, + Cardiac Stents and + Peripheral Vascular Disease  Normal cardiovascular exam     Neuro/Psych negative neurological ROS  negative psych ROS   GI/Hepatic Neg liver ROS, GERD  Medicated and Controlled,  Endo/Other  diabetes, Type 2, Oral Hypoglycemic Agents  Renal/GU negative Renal ROS     Musculoskeletal negative musculoskeletal ROS (+)   Abdominal   Peds  Hematology negative hematology ROS (+)   Anesthesia Other Findings   Reproductive/Obstetrics                          Anesthesia Physical Anesthesia Plan  ASA: III  Anesthesia Plan: General   Post-op Pain Management:    Induction: Intravenous  PONV Risk Score and Plan: 2 and Treatment may vary due to age or medical condition, Ondansetron and Dexamethasone  Airway Management Planned: LMA  Additional Equipment: None  Intra-op Plan:   Post-operative Plan: Extubation in OR  Informed Consent: I have reviewed the patients History and Physical, chart, labs and discussed the procedure including the risks, benefits and alternatives for the proposed anesthesia with the patient or authorized representative who has indicated his/her understanding and  acceptance.     Dental advisory given  Plan Discussed with: CRNA and Anesthesiologist  Anesthesia Plan Comments:     Anesthesia Quick Evaluation

## 2019-01-10 ENCOUNTER — Encounter (HOSPITAL_COMMUNITY): Payer: Self-pay

## 2019-01-10 ENCOUNTER — Ambulatory Visit (HOSPITAL_COMMUNITY): Payer: Medicare Other | Admitting: Physician Assistant

## 2019-01-10 ENCOUNTER — Ambulatory Visit (HOSPITAL_COMMUNITY)
Admission: RE | Admit: 2019-01-10 | Discharge: 2019-01-10 | Disposition: A | Discharge status: Home / Self Care | Payer: Medicare Other | Attending: Thoracic Surgery (Cardiothoracic Vascular Surgery) | Admitting: Thoracic Surgery (Cardiothoracic Vascular Surgery)

## 2019-01-10 ENCOUNTER — Ambulatory Visit (HOSPITAL_COMMUNITY): Payer: Medicare Other | Admitting: Certified Registered"

## 2019-01-10 ENCOUNTER — Encounter (HOSPITAL_COMMUNITY)
Admission: RE | Disposition: A | Discharge status: Home / Self Care | Payer: Self-pay | Source: Home / Self Care | Attending: Thoracic Surgery (Cardiothoracic Vascular Surgery)

## 2019-01-10 ENCOUNTER — Ambulatory Visit (HOSPITAL_COMMUNITY): Payer: Medicare Other

## 2019-01-10 ENCOUNTER — Other Ambulatory Visit: Payer: Self-pay

## 2019-01-10 DIAGNOSIS — Z79899 Other long term (current) drug therapy: Secondary | ICD-10-CM | POA: Insufficient documentation

## 2019-01-10 DIAGNOSIS — Z7984 Long term (current) use of oral hypoglycemic drugs: Secondary | ICD-10-CM | POA: Diagnosis not present

## 2019-01-10 DIAGNOSIS — R911 Solitary pulmonary nodule: Secondary | ICD-10-CM | POA: Diagnosis not present

## 2019-01-10 DIAGNOSIS — C969 Malignant neoplasm of lymphoid, hematopoietic and related tissue, unspecified: Secondary | ICD-10-CM | POA: Insufficient documentation

## 2019-01-10 DIAGNOSIS — J449 Chronic obstructive pulmonary disease, unspecified: Secondary | ICD-10-CM | POA: Diagnosis not present

## 2019-01-10 DIAGNOSIS — E785 Hyperlipidemia, unspecified: Secondary | ICD-10-CM | POA: Diagnosis not present

## 2019-01-10 DIAGNOSIS — Z955 Presence of coronary angioplasty implant and graft: Secondary | ICD-10-CM | POA: Insufficient documentation

## 2019-01-10 DIAGNOSIS — I10 Essential (primary) hypertension: Secondary | ICD-10-CM | POA: Insufficient documentation

## 2019-01-10 DIAGNOSIS — Z7982 Long term (current) use of aspirin: Secondary | ICD-10-CM | POA: Insufficient documentation

## 2019-01-10 DIAGNOSIS — Z7902 Long term (current) use of antithrombotics/antiplatelets: Secondary | ICD-10-CM | POA: Insufficient documentation

## 2019-01-10 DIAGNOSIS — E119 Type 2 diabetes mellitus without complications: Secondary | ICD-10-CM | POA: Diagnosis not present

## 2019-01-10 DIAGNOSIS — R590 Localized enlarged lymph nodes: Secondary | ICD-10-CM | POA: Diagnosis not present

## 2019-01-10 DIAGNOSIS — I2510 Atherosclerotic heart disease of native coronary artery without angina pectoris: Secondary | ICD-10-CM | POA: Diagnosis not present

## 2019-01-10 DIAGNOSIS — Z7951 Long term (current) use of inhaled steroids: Secondary | ICD-10-CM | POA: Diagnosis not present

## 2019-01-10 DIAGNOSIS — I252 Old myocardial infarction: Secondary | ICD-10-CM | POA: Insufficient documentation

## 2019-01-10 DIAGNOSIS — G4730 Sleep apnea, unspecified: Secondary | ICD-10-CM | POA: Insufficient documentation

## 2019-01-10 DIAGNOSIS — R591 Generalized enlarged lymph nodes: Secondary | ICD-10-CM | POA: Diagnosis present

## 2019-01-10 DIAGNOSIS — R599 Enlarged lymph nodes, unspecified: Secondary | ICD-10-CM

## 2019-01-10 HISTORY — PX: LYMPH NODE BIOPSY: SHX201

## 2019-01-10 LAB — GLUCOSE, CAPILLARY
Glucose-Capillary: 129 mg/dL — ABNORMAL HIGH (ref 70–99)
Glucose-Capillary: 137 mg/dL — ABNORMAL HIGH (ref 70–99)

## 2019-01-10 SURGERY — LYMPH NODE BIOPSY
Anesthesia: General | Laterality: Left

## 2019-01-10 MED ORDER — ONDANSETRON HCL 4 MG/2ML IJ SOLN
INTRAMUSCULAR | Status: DC | PRN
  Administered 2019-01-10: 4 mg via INTRAVENOUS

## 2019-01-10 MED ORDER — EPHEDRINE SULFATE 50 MG/ML IJ SOLN
INTRAMUSCULAR | Status: DC | PRN
  Administered 2019-01-10 (×2): 10 mg via INTRAVENOUS

## 2019-01-10 MED ORDER — PHENYLEPHRINE 40 MCG/ML (10ML) SYRINGE FOR IV PUSH (FOR BLOOD PRESSURE SUPPORT)
PREFILLED_SYRINGE | INTRAVENOUS | Status: AC
  Filled 2019-01-10: qty 10

## 2019-01-10 MED ORDER — OXYCODONE HCL 5 MG/5ML PO SOLN: 5.0000 mg | Freq: Once | ORAL | Status: DC | PRN

## 2019-01-10 MED ORDER — LIDOCAINE 2% (20 MG/ML) 5 ML SYRINGE
INTRAMUSCULAR | Status: DC | PRN
  Administered 2019-01-10: 60 mg via INTRAVENOUS

## 2019-01-10 MED ORDER — LIDOCAINE 2% (20 MG/ML) 5 ML SYRINGE
INTRAMUSCULAR | Status: AC
  Filled 2019-01-10: qty 5

## 2019-01-10 MED ORDER — LACTATED RINGERS IV SOLN
INTRAVENOUS | Status: DC | PRN
  Administered 2019-01-10: 07:00:00 via INTRAVENOUS

## 2019-01-10 MED ORDER — PROPOFOL 10 MG/ML IV BOLUS
INTRAVENOUS | Status: AC
  Filled 2019-01-10: qty 20

## 2019-01-10 MED ORDER — FENTANYL CITRATE (PF) 250 MCG/5ML IJ SOLN
INTRAMUSCULAR | Status: DC | PRN
  Administered 2019-01-10: 50 ug via INTRAVENOUS
  Administered 2019-01-10 (×3): 25 ug via INTRAVENOUS

## 2019-01-10 MED ORDER — BUPIVACAINE-EPINEPHRINE 0.25% -1:200000 IJ SOLN
INTRAMUSCULAR | Status: DC | PRN
  Administered 2019-01-10: 30 mL

## 2019-01-10 MED ORDER — BUPIVACAINE-EPINEPHRINE (PF) 0.25% -1:200000 IJ SOLN
INTRAMUSCULAR | Status: AC
  Filled 2019-01-10: qty 30

## 2019-01-10 MED ORDER — GLYCOPYRROLATE PF 0.2 MG/ML IJ SOSY
PREFILLED_SYRINGE | INTRAMUSCULAR | Status: AC
  Filled 2019-01-10: qty 2

## 2019-01-10 MED ORDER — GLYCOPYRROLATE 0.2 MG/ML IJ SOLN
INTRAMUSCULAR | Status: DC | PRN
  Administered 2019-01-10: 0.2 mg via INTRAVENOUS

## 2019-01-10 MED ORDER — 0.9 % SODIUM CHLORIDE (POUR BTL) OPTIME
TOPICAL | Status: DC | PRN
  Administered 2019-01-10: 1000 mL

## 2019-01-10 MED ORDER — MIDAZOLAM HCL 2 MG/2ML IJ SOLN
INTRAMUSCULAR | Status: DC | PRN
  Administered 2019-01-10: 1 mg via INTRAVENOUS

## 2019-01-10 MED ORDER — ONDANSETRON HCL 4 MG/2ML IJ SOLN: 4.0000 mg | Freq: Once | INTRAMUSCULAR | Status: DC | PRN

## 2019-01-10 MED ORDER — CEFAZOLIN SODIUM-DEXTROSE 2-4 GM/100ML-% IV SOLN
INTRAVENOUS | Status: AC
  Filled 2019-01-10: qty 100

## 2019-01-10 MED ORDER — OXYCODONE HCL 5 MG PO TABS: 5.0000 mg | ORAL_TABLET | Freq: Once | ORAL | Status: DC | PRN

## 2019-01-10 MED ORDER — CLOPIDOGREL BISULFATE 75 MG PO TABS: 75.0000 mg | ORAL_TABLET | Freq: Every evening | ORAL | 0 refills | Status: DC

## 2019-01-10 MED ORDER — PROPOFOL 10 MG/ML IV BOLUS
INTRAVENOUS | Status: DC | PRN
  Administered 2019-01-10: 150 mg via INTRAVENOUS

## 2019-01-10 MED ORDER — ONDANSETRON HCL 4 MG/2ML IJ SOLN
INTRAMUSCULAR | Status: AC
  Filled 2019-01-10: qty 2

## 2019-01-10 MED ORDER — FENTANYL CITRATE (PF) 100 MCG/2ML IJ SOLN: 25.0000 ug | INTRAMUSCULAR | Status: DC | PRN

## 2019-01-10 MED ORDER — SODIUM CHLORIDE 0.9 % IV SOLN
INTRAVENOUS | Status: DC | PRN
  Administered 2019-01-10: 15 ug/min via INTRAVENOUS

## 2019-01-10 MED ORDER — TRAMADOL HCL 50 MG PO TABS: 50.0000 mg | ORAL_TABLET | Freq: Four times a day (QID) | ORAL | 0 refills | Status: AC | PRN

## 2019-01-10 MED ORDER — FENTANYL CITRATE (PF) 250 MCG/5ML IJ SOLN
INTRAMUSCULAR | Status: AC
  Filled 2019-01-10: qty 5

## 2019-01-10 MED ORDER — MIDAZOLAM HCL 2 MG/2ML IJ SOLN
INTRAMUSCULAR | Status: AC
  Filled 2019-01-10: qty 2

## 2019-01-10 MED ORDER — DEXAMETHASONE SODIUM PHOSPHATE 10 MG/ML IJ SOLN
INTRAMUSCULAR | Status: AC
  Filled 2019-01-10: qty 1

## 2019-01-10 MED ORDER — CEFAZOLIN SODIUM-DEXTROSE 2-3 GM-%(50ML) IV SOLR
INTRAVENOUS | Status: DC | PRN
  Administered 2019-01-10: 2 g via INTRAVENOUS

## 2019-01-10 SURGICAL SUPPLY — 36 items
ADH SKN CLS APL DERMABOND .7 (GAUZE/BANDAGES/DRESSINGS) ×1
CANISTER SUCT 3000ML PPV (MISCELLANEOUS) ×3 IMPLANT
CONT SPEC 4OZ CLIKSEAL STRL BL (MISCELLANEOUS) ×3 IMPLANT
COVER SURGICAL LIGHT HANDLE (MISCELLANEOUS) ×3 IMPLANT
DECANTER SPIKE VIAL GLASS SM (MISCELLANEOUS) ×2 IMPLANT
DERMABOND ADVANCED (GAUZE/BANDAGES/DRESSINGS) ×2
DERMABOND ADVANCED .7 DNX12 (GAUZE/BANDAGES/DRESSINGS) ×1 IMPLANT
DRAPE LAPAROSCOPIC ABDOMINAL (DRAPES) ×1 IMPLANT
DRAPE SLUSH/WARMER DISC (DRAPES) IMPLANT
ELECT NDL BLADE 2-5/6 (NEEDLE) IMPLANT
ELECT NEEDLE BLADE 2-5/6 (NEEDLE) ×3 IMPLANT
ELECT REM PT RETURN 9FT ADLT (ELECTROSURGICAL) ×3
ELECTRODE REM PT RTRN 9FT ADLT (ELECTROSURGICAL) ×1 IMPLANT
GAUZE 4X4 16PLY RFD (DISPOSABLE) ×3 IMPLANT
GLOVE BIO SURGEON STRL SZ7 (GLOVE) ×3 IMPLANT
GLOVE BIO SURGEON STRL SZ7.5 (GLOVE) ×1 IMPLANT
GOWN STRL REUS W/ TWL XL LVL3 (GOWN DISPOSABLE) ×1 IMPLANT
GOWN STRL REUS W/TWL XL LVL3 (GOWN DISPOSABLE) ×3
KIT BASIN OR (CUSTOM PROCEDURE TRAY) ×3 IMPLANT
KIT TURNOVER KIT B (KITS) IMPLANT
NEEDLE HYPO 22GX1.5 SAFETY (NEEDLE) ×3 IMPLANT
NS IRRIG 1000ML POUR BTL (IV SOLUTION) ×6 IMPLANT
PACK GENERAL/GYN (CUSTOM PROCEDURE TRAY) ×1 IMPLANT
PAD ARMBOARD 7.5X6 YLW CONV (MISCELLANEOUS) ×3 IMPLANT
SUT MNCRL AB 3-0 PS2 18 (SUTURE) ×1 IMPLANT
SUT SILK  1 MH (SUTURE) ×2
SUT SILK 1 MH (SUTURE) IMPLANT
SUT SILK 2 0 SH CR/8 (SUTURE) ×2 IMPLANT
SUT VIC AB 2-0 SH 27 (SUTURE) ×3
SUT VIC AB 2-0 SH 27XBRD (SUTURE) IMPLANT
SUT VIC AB 3-0 SH 27 (SUTURE) ×3
SUT VIC AB 3-0 SH 27X BRD (SUTURE) ×1 IMPLANT
SYR CONTROL 10ML LL (SYRINGE) ×5 IMPLANT
TOWEL GREEN STERILE (TOWEL DISPOSABLE) ×3 IMPLANT
TOWEL GREEN STERILE FF (TOWEL DISPOSABLE) ×3 IMPLANT
WATER STERILE IRR 1000ML POUR (IV SOLUTION) ×3 IMPLANT

## 2019-01-10 NOTE — Anesthesia Procedure Notes (Signed)
Procedure Name: LMA Insertion Date/Time: 01/10/2019 7:29 AM Performed by: Clearnce Sorrel, CRNA Pre-anesthesia Checklist: Patient identified, Emergency Drugs available, Suction available, Patient being monitored and Timeout performed Patient Re-evaluated:Patient Re-evaluated prior to induction Oxygen Delivery Method: Circle system utilized Preoxygenation: Pre-oxygenation with 100% oxygen Induction Type: IV induction LMA: LMA inserted LMA Size: 4.0 Number of attempts: 1 Placement Confirmation: positive ETCO2 and breath sounds checked- equal and bilateral Tube secured with: Tape Dental Injury: Teeth and Oropharynx as per pre-operative assessment

## 2019-01-10 NOTE — Transfer of Care (Signed)
Immediate Anesthesia Transfer of Care Note  Patient: Nocholas Damaso Duplechain  Procedure(s) Performed: left supraclavicular LYMPH NODE BIOPSY (Left )  Patient Location: PACU  Anesthesia Type:General  Level of Consciousness: awake, alert  and oriented  Airway & Oxygen Therapy: Patient Spontanous Breathing and Patient connected to nasal cannula oxygen  Post-op Assessment: Report given to RN and Post -op Vital signs reviewed and stable  Post vital signs: Reviewed and stable  Last Vitals:  Vitals Value Taken Time  BP 131/74 01/10/19 0920  Temp    Pulse 77 01/10/19 0921  Resp 19 01/10/19 0921  SpO2 100 % 01/10/19 0921  Vitals shown include unvalidated device data.  Last Pain:  Vitals:   01/10/19 2924  TempSrc:   PainSc: 0-No pain         Complications: No apparent anesthesia complications

## 2019-01-10 NOTE — Op Note (Signed)
      [address]       [place] [address]             [phone]        01/10/2019  Patient:  Trevor Bailey Pre-Op Dx:  left supraclavicular lymphadenopathy   Left upper lobe pulmonary nodule   Post-op Dx:  same Procedure: - excisional biopsy of left supraclavicular lymph node  Surgeon and Role:      * Kadejah Sandiford, Lucile Crater, MD - Primary  Anesthesia  LMA EBL:  Minimal  Blood Administration: none Specimen:  Left supraclavicular lymph node  Counts: correct   Indications: [name] [id number] y.o.maleis seen in the multidisciplinary clinic for evaluation of a 2cm left upper lone pulmonary nodule. He also has a 2cm right adrenal gland lesion. On PET/CT, in addition to the avidity in the pulmonary and adrenal nodule, he also exhibited SUV uptake in a left supraclavicular lymph node, and a nodule anterior to the right inferior pubic ramus.   Findings: Large firm node densely adherent to the muscle.    Operative Technique: After the risks, benefits and alternatives were thoroughly discussed, the patient was brought to the operative theatre.  Anesthesia was induced, was prepped and draped in normal sterile fashion.  An appropriate surgical pause was performed, and pre-operative antibiotics were dosed accordingly.  The lymph node was palpable in the left supraclavicular area.  A 3cm incision was made over the nodule, and carried down with a combination of bovie cautery, and blunt dissection.  The lymph node was circumferentially dissected.  It extended very deep in the the neck, so it was truncated at its base, and passed off as specimen.  The wound was copiously irrigated, and meticulous hemostasis was obtained.  Marcaine was injected into the wound, and the skin and soft tissue were closed with absorbable suture    The patient tolerated the procedure without any immediate complications, and was transferred to the PACU in stable condition.  Yoltzin Barg Bary Leriche

## 2019-01-10 NOTE — H&P (Signed)
[address]       [place] [address]             [phone]        Jwan J Glendening [place] Medical Record #[id number] Date of Birth: [birth year]  Referring: [name], MD Primary Care: [name], MD Primary Cardiologist: Shelva Majestic, MD  Chief Complaint:   here for surgery   History of Present Illness:    No events since his last clinic appointment  Per my last note Trevor Bailey 73 y.o. male is seen in the multidisciplinary clinic for evaluation of a 2cm left upper lone pulmonary nodule.  He also has a 2cm right adrenal gland lesion.  On PET/CT, in addition to the avidity in the pulmonary and adrenal nodule, he also exhibited SUV uptake in a left supraclavicular lymph node, and a nodule anterior to the right inferior pubic ramus.    He originally presented to his PCP with the complaint of ~20lbs weight loss.  He has been cigarette free for the last 4 weeks.  He has no other complaints today.     Current Activity/ Functional Status:  Patient is independent with mobility/ambulation, transfers, ADL's, IADL's.   Zubrod Score: At the time of surgery this patients most appropriate activity status/level should be described as: [x] ?    0    Normal activity, no symptoms [] ?    1    Restricted in physical strenuous activity but ambulatory, able to do out light work [] ?    2    Ambulatory and capable of self care, unable to do work activities, up and about               >50 % of waking hours                              [] ?    3    Only limited self care, in bed greater than 50% of waking hours [] ?    4    Completely disabled, no self care, confined to bed or chair [] ?    5    Moribund       Past Medical History:  Diagnosis Date   COPD (chronic obstructive pulmonary disease) ([place])    Coronary disease    Diabetes ([place])    Hyperlipidemia    Hypertension    Myocardial infarction [facility])          Past Surgical History:   Procedure Laterality Date   ABDOMINAL AORTAGRAM Left 09/16/2013   Procedure: ABDOMINAL Maxcine Ham;  Surgeon: Elam Dutch, MD;  Location: [facility] CATH LAB;  Service: Cardiovascular;  Laterality: Left;   CARDIAC CATHETERIZATION  01/2011   When there was segmental stenosis of the distal RCA, patent PCA stent, patent circumflex stent, and a patent small diagonal with 90% ISR.    CORONARY ANGIOPLASTY  may 2002   Non-DES stenting of his circumflex, non-DES stenting of the RCA   HYDROCELE EXCISION / REPAIR  2009   by Dr Janice Norrie   INGUINAL HERNIA REPAIR Bilateral    Dr Bubba Camp   NM MYOCAR PERF WALL MOTION  01/08/2011   moderate in size and intensity area of reversible ischemia in the basal to mid inferior and septal territories. Abnormal study   stents  2008   proximal RCA, DES for progession of disease.   US ECHOCARDIOGRAPHY  01/12/2012   mild concentric LVH, borderline  LA enlargement, mild to mod TR         Family History  Problem Relation Age of Onset   Heart attack Mother    Heart disease Mother    Diabetes Father    Stroke Father    Hypertension Father    Cancer Sister    Hyperlipidemia Sister    Hypertension Sister      Social History       Tobacco Use  Smoking Status Former Smoker   Packs/day: 0.10   Years: 50.00   Pack years: 5.00   Types: Cigarettes  Smokeless Tobacco Never Used  Tobacco Comment   pt states that he is cutting back on smoking    Social History       Substance and Sexual Activity  Alcohol Use Yes   Alcohol/week: 0.0 standard drinks   Comment: moderately     No Known Allergies        Current Outpatient Medications  Medication Sig Dispense Refill   amLODipine (NORVASC) 5 MG tablet Take 1 tablet (5 mg total) by mouth daily. 90 tablet 3   aspirin EC 81 MG tablet Take 81 mg by mouth daily.     atorvastatin (LIPITOR) 80 MG tablet Take 1 tablet (80 mg total) by mouth daily. 90 tablet 3   BREO  ELLIPTA 100-25 MCG/INH AEPB Inhale 1 puff into the lungs 2 (two) times daily.     CLARITIN 10 MG tablet Take 1 tablet by mouth daily.     clopidogrel (PLAVIX) 75 MG tablet Take 1 tablet by mouth daily.     finasteride (PROSCAR) 5 MG tablet Take 1 tablet by mouth daily.     Fluticasone Furoate-Vilanterol (BREO ELLIPTA) 100-25 MCG/INH AEPB Inhale 1 puff into the lungs daily.     hydrochlorothiazide (HYDRODIURIL) 25 MG tablet Take 1 tablet by mouth daily.     latanoprost (XALATAN) 0.005 % ophthalmic solution      metFORMIN (GLUCOPHAGE) 1000 MG tablet Take 1 tablet by mouth 2 (two) times daily.     metoprolol tartrate (LOPRESSOR) 25 MG tablet Take 25 mg 2 (two) times daily by mouth.     nitroGLYCERIN (NITROSTAT) 0.4 MG SL tablet Place 1 tablet (0.4 mg total) under the tongue every 5 (five) minutes as needed for chest pain. (Patient not taking: Reported on 12/23/2018) 25 tablet 6   pantoprazole (PROTONIX) 40 MG tablet Take 40 mg daily by mouth.     ramipril (ALTACE) 10 MG capsule Take 1 capsule by mouth 2 (two) times daily.     VENTOLIN HFA 108 (90 Base) MCG/ACT inhaler   0   ZETIA 10 MG tablet Take 1 tablet by mouth daily.     No current facility-administered medications for this visit.      Review of Systems:  Constitutional: weight loss.  Denies fevers or chills Cardiovascular: denies chest pain or shortness of breath Pulmonary: denies SOB, no cough GI: negative MSK: negative Neuro: negative   PHYSICAL EXAMINATION: Today's Vitals   01/10/19 0613 01/10/19 0632  BP: (!) 156/87   Pulse: 64   Temp: 98.2 F (36.8 C)   TempSrc: Oral   SpO2: 100%   Weight:  86.5 kg  Height:  5\' 9"  (1.753 m)  PainSc:  0-No pain   Body mass index is 28.15 kg/m.  General: non-toxic, appears  stated age.   HEENT: NCAT.  Moist mucous membranes, Oropharynx clear.  no cervical adenopathy.  The left supraclavicular lymph node is palpable.  It is  most prominent  when the left arm is down.   Neuro: Alert, Oriented X 3. Non focal. Psych: appropriate mood. Cardiovascular: RRR, no murmur, good peripheral pulses Pulmonary:  Coarse BS bilaterally GI: soft, ND, NT MSK: ambulates wells.   Diagnostic Studies & Laboratory data:     Recent Radiology Findings:    Imaging Results  Nm Pet Image Initial (pi) Skull Base To Thigh  Result Date: 12/06/2018 CLINICAL DATA:  Initial treatment strategy for lung mass. EXAM: NUCLEAR MEDICINE PET SKULL BASE TO THIGH TECHNIQUE: 9.6 mCi F-18 FDG was injected intravenously. Full-ring PET imaging was performed from the skull base to thigh after the radiotracer. CT data was obtained and used for attenuation correction and anatomic localization. Fasting blood glucose: 115 mg/dl COMPARISON:  CT chest 11/18/2018 and CT abdomen pelvis 01/24/2015. FINDINGS: Mediastinal blood pool activity: SUV max 2.7 Liver activity: SUV max NA NECK: No hypermetabolic lymph nodes. Incidental CT findings: None. CHEST: Left supraclavicular lymph node measures 1.9 x 2.6 cm (CT image 41) with an SUV max 13.2. 2 adjacent nodules in the posterior aspect of left upper lobe measure up to 1.0 x 1.4 cm (series 8, image 20) with an SUV max of 4.3. No hypermetabolic mediastinal, hilar or axillary lymph nodes. Incidental CT findings: Atherosclerotic calcification of the aorta, aortic valve and coronary arteries. No pericardial or pleural effusion. Centrilobular emphysema. ABDOMEN/PELVIS: Right adrenal nodule measures 1.6 x 2.6 cm with an SUV max of 8.8. No additional abnormal hypermetabolism in the liver, left adrenal gland, spleen or pancreas. No hypermetabolic lymph nodes. Focal hypermetabolism is seen in the musculature anterior to the right inferior pubic ramus, where there is likely a mildly hyperdense 1.2 cm nodule (CT image 189) with an SUV max of 6.0. Finally, there is intense hypermetabolic activity within a partially calcified and nodular appearing prostate.  Incidental CT findings: Low-attenuation lesions in the liver are similar to 01/24/2015 and are likely cysts. Liver, gallbladder and left adrenal gland are otherwise unremarkable. Fluid density 2.6 cm low-attenuation lesion in the right kidney, similar and likely a cyst. Vague low-attenuation lesion in the lower pole right kidney also similar to 01/24/2015. Subcentimeter low-attenuation lesion in the interpolar left kidney, too small to characterize. Spleen, pancreas, stomach and bowel are grossly unremarkable. Atherosclerotic calcification of the aorta. SKELETON: No abnormal osseous hypermetabolism. Incidental CT findings: Degenerative changes in the spine. 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 IMPRESSION: 1. Hypermetabolic left upper lobe nodules, left supraclavicular lymph node, right adrenal mass and muscular lesion along the right inferior pubic ramus. Distribution of findings is suspicious for melanoma. 2. Hypermetabolic and nodular appearing prostate. Malignancy is not excluded. 3. Aortic atherosclerosis (ICD10-170.0). Coronary artery calcification. 4.  Emphysema (ICD10-J43.9). Electronically Signed   By: Lorin Picket M.D.   On: 12/06/2018 09:45        I have independently reviewed the above radiology studies  and reviewed the findings with the patient.   Recent Lab Findings: Recent Labs       Lab Results  Component Value Date   WBC 6.8 12/23/2018   HGB 13.7 12/23/2018   HCT 40.4 12/23/2018   PLT 270 12/23/2018   GLUCOSE 117 (H) 12/23/2018   CHOL 139 05/16/2014   TRIG 111 05/16/2014   HDL 49 05/16/2014   LDLCALC 68 05/16/2014   ALT 35 12/23/2018   AST 23 12/23/2018   NA 141 12/23/2018   K 4.8 12/23/2018   CL 104 12/23/2018   CREATININE 1.10 12/23/2018  BUN 20 12/23/2018   CO2 25 12/23/2018   TSH 0.621 06/29/2013   INR 1.0 08/31/2007         Assessment / Plan:   73 yo male with left upper lobe pulmonary nodule, and  extra-thoracic PET Avidity concerning for metastatic disease.  He also has a history of coronary artery disease, and is currently on plavix.  I have recommended that he undergo an excisional biopsy of the left supraclavicular lymph node, which is palpable on exam.  (appears deep on PET, due to arms up.  When lowered the nodule raises above the clavicle).  Risks, benefits and alternatives have been discussed and he is agreeable to proceed.        [name] 01/10/2019

## 2019-01-10 NOTE — Anesthesia Postprocedure Evaluation (Signed)
Anesthesia Post Note  Patient: [name]  Procedure(s) Performed: left supraclavicular LYMPH NODE BIOPSY (Left )     Patient location during evaluation: PACU Anesthesia Type: General Level of consciousness: awake and alert Pain management: pain level controlled Vital Signs Assessment: post-procedure vital signs reviewed and stable Respiratory status: spontaneous breathing, nonlabored ventilation and respiratory function stable Cardiovascular status: blood pressure returned to baseline and stable Postop Assessment: no apparent nausea or vomiting Anesthetic complications: no    Last Vitals:  Vitals:   01/10/19 1005 01/10/19 1017  BP: 135/80 128/77  Pulse: 74 77  Resp: 15   Temp: 36.4 C   SpO2: 97% 96%    Last Pain:  Vitals:   01/10/19 1017  TempSrc:   PainSc: 0-No pain                 Audry Pili

## 2019-01-11 ENCOUNTER — Encounter (HOSPITAL_COMMUNITY): Payer: Self-pay | Admitting: Thoracic Surgery (Cardiothoracic Vascular Surgery)

## 2019-01-12 ENCOUNTER — Encounter: Payer: Self-pay | Admitting: *Deleted

## 2019-01-12 NOTE — Progress Notes (Signed)
Oncology Nurse Navigator Documentation  Oncology Nurse Navigator Flowsheets 01/12/2019  Diagnosis Status -  Navigator Follow Up Date: -  Navigator Follow Up Reason: -  Navigator Location [facility]  Referral Date to RadOnc/MedOnc -  Navigator Encounter Type Other/I followed up with pathology to see if there is enough tissue for molecular and PDL 1 testing.  I was updated there is enough and I updated Dr. Julien Nordmann.  He requested Foundation One and PDL 1 testing and I notified pathology to send testing.    Telephone -  Abnormal Finding Date -  Treatment Initiated Date -  Patient Visit Type -  Treatment Phase Pre-Tx/Tx Discussion  Barriers/Navigation Needs Coordination of Care  Education -  Interventions Coordination of Care  Coordination of Care Other  Education Method -  Acuity Level 2  Time Spent with Patient 45

## 2019-01-13 ENCOUNTER — Other Ambulatory Visit: Payer: Self-pay | Admitting: Internal Medicine

## 2019-01-13 ENCOUNTER — Inpatient Hospital Stay (HOSPITAL_BASED_OUTPATIENT_CLINIC_OR_DEPARTMENT_OTHER): Payer: Medicare Other | Admitting: Physician Assistant

## 2019-01-13 ENCOUNTER — Other Ambulatory Visit: Payer: Self-pay

## 2019-01-13 ENCOUNTER — Encounter: Payer: Self-pay | Admitting: Physician Assistant

## 2019-01-13 ENCOUNTER — Inpatient Hospital Stay: Payer: Medicare Other

## 2019-01-13 VITALS — BP 121/82 | HR 77 | Temp 98.3°F | Resp 18 | Ht 69.0 in | Wt 190.0 lb

## 2019-01-13 DIAGNOSIS — R918 Other nonspecific abnormal finding of lung field: Secondary | ICD-10-CM

## 2019-01-13 DIAGNOSIS — Z7189 Other specified counseling: Secondary | ICD-10-CM | POA: Diagnosis not present

## 2019-01-13 DIAGNOSIS — C3412 Malignant neoplasm of upper lobe, left bronchus or lung: Secondary | ICD-10-CM | POA: Diagnosis not present

## 2019-01-13 DIAGNOSIS — Z5111 Encounter for antineoplastic chemotherapy: Secondary | ICD-10-CM | POA: Insufficient documentation

## 2019-01-13 DIAGNOSIS — C3492 Malignant neoplasm of unspecified part of left bronchus or lung: Secondary | ICD-10-CM | POA: Diagnosis not present

## 2019-01-13 LAB — CBC WITH DIFFERENTIAL (CANCER CENTER ONLY)
Abs Immature Granulocytes: 0.03 10*3/uL (ref 0.00–0.07)
Basophils Absolute: 0 10*3/uL (ref 0.0–0.1)
Basophils Relative: 0 %
Eosinophils Absolute: 0.3 10*3/uL (ref 0.0–0.5)
Eosinophils Relative: 4 %
HCT: 39 % (ref 39.0–52.0)
Hemoglobin: 13 g/dL (ref 13.0–17.0)
Immature Granulocytes: 0 %
Lymphocytes Relative: 21 %
Lymphs Abs: 1.5 10*3/uL (ref 0.7–4.0)
MCH: 32.2 pg (ref 26.0–34.0)
MCHC: 33.3 g/dL (ref 30.0–36.0)
MCV: 96.5 fL (ref 80.0–100.0)
Monocytes Absolute: 0.9 10*3/uL (ref 0.1–1.0)
Monocytes Relative: 12 %
Neutro Abs: 4.7 10*3/uL (ref 1.7–7.7)
Neutrophils Relative %: 63 %
Platelet Count: 265 10*3/uL (ref 150–400)
RBC: 4.04 MIL/uL — ABNORMAL LOW (ref 4.22–5.81)
RDW: 12.2 % (ref 11.5–15.5)
WBC Count: 7.5 10*3/uL (ref 4.0–10.5)
nRBC: 0 % (ref 0.0–0.2)

## 2019-01-13 LAB — CMP (CANCER CENTER ONLY)
ALT: 24 U/L (ref 0–44)
AST: 17 U/L (ref 15–41)
Albumin: 3.5 g/dL (ref 3.5–5.0)
Alkaline Phosphatase: 92 U/L (ref 38–126)
Anion gap: 10 (ref 5–15)
BUN: 19 mg/dL (ref 8–23)
CO2: 25 mmol/L (ref 22–32)
Calcium: 10 mg/dL (ref 8.9–10.3)
Chloride: 106 mmol/L (ref 98–111)
Creatinine: 1.16 mg/dL (ref 0.61–1.24)
GFR, Est AFR Am: >60 mL/min (ref >60)
GFR, Estimated: >60 mL/min (ref >60)
Glucose, Bld: 154 mg/dL — ABNORMAL HIGH (ref 70–99)
Potassium: 4.4 mmol/L (ref 3.5–5.1)
Sodium: 141 mmol/L (ref 135–145)
Total Bilirubin: 0.3 mg/dL (ref 0.3–1.2)
Total Protein: 7.5 g/dL (ref 6.5–8.1)

## 2019-01-13 MED ORDER — PROCHLORPERAZINE MALEATE 10 MG PO TABS: 10.0000 mg | ORAL_TABLET | Freq: Four times a day (QID) | ORAL | 2 refills | Status: DC | PRN

## 2019-01-13 NOTE — Progress Notes (Signed)
START ON PATHWAY REGIMEN - Non-Small Cell Lung     Administer weekly:     Paclitaxel      Carboplatin   **Always confirm dose/schedule in your pharmacy ordering system**  Patient Characteristics: Stage III - Unresectable, PS = 0, 1 AJCC T Category: T1b Current Disease Status: No Distant Mets or Local Recurrence AJCC N Category: N3 AJCC M Category: M0 AJCC 8 Stage Grouping: IIIB ECOG Performance Status: 1 Intent of Therapy: Curative Intent, Discussed with Patient

## 2019-01-13 NOTE — Patient Instructions (Addendum)
-  Your biopsy was consistent with a type of Non-small cell lung cancer. A type called Adenocarcinoma -There is a spot that we are not sure what it is in the adrenal gland. We want to make sure that it is not cancer. We will send a referral to have a biopsy of the adrenal gland. Be on the lookout for a call from the intervention radiology -We will send a referral to radiation oncology to discuss radiation treatment with you.  -We will arrange for you to have a chemotherapy education class before your first treatment of chemotherapy/radiation. The plan is to tentatively have your first treatment on 9/14 -I have sent a nausea medication to your pharmacy (compazine) -We will need an MRI of your brain to make sure that there is no disease there. This is a part of the routine staging workup.  -The plan is to get chemotherapy and radiation treatment to the spot in the lung. Chemo is usually once a week for about 6 ish weeks. You will also be receiving daily radiation treatment during this time.  -Our number is [phone]

## 2019-01-13 NOTE — Progress Notes (Signed)
[facility] OFFICE PROGRESS NOTE  [name], MD [address] [place] [address]  DIAGNOSIS: Suspicious metastatic non-small cell lung cancer, adenocarcinoma. 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  PRIOR THERAPY: None  CURRENT THERAPY: Weekly concurrent chemoradiation with carboplatin for an AUC of 2 and Paclitaxel 45 mg/m2. First dose expected on 01/31/2019   INTERVAL HISTORY: Trevor Bailey 73 y.o. male returnsto the clinic for a follow-up visit.  The patient is feeling well today without any concerning complaints.  He denies any fever, chills, night sweats, or weight loss. He denies any chest pain, shortness of breath, cough, or hemoptysis.  He denies any nausea, vomiting, diarrhea, or constipation.  Denies any headache vision changes.  The patient recently had an excisional biopsy of the left supraclavicular lymph node which was hypermetabolic on his recent PET scan.  The patient is here for evaluation and to discuss his biopsy results and treatment options.  MEDICAL HISTORY: Past Medical History:  Diagnosis Date  . Adenopathy    left supraclavicular lymph node  . Allergies   . COPD (chronic obstructive pulmonary disease) ([place])   . Coronary disease   . Diabetes ([place])   . Enlarged prostate   . GERD (gastroesophageal reflux disease)   . Glaucoma   . Hyperlipidemia   . Hypertension   . Myocardial infarction ([place])   . Sleep apnea    wears CPAP  . Wears partial dentures    upper and lower    ALLERGIES:  has No Known Allergies.  MEDICATIONS:  Current Outpatient Medications  Medication Sig Dispense Refill  . amLODipine (NORVASC) 5 MG tablet Take 1 tablet (5 mg total) by mouth daily. 90 tablet 3  . aspirin EC 81 MG tablet Take 81 mg by mouth daily.    Marland Kitchen atorvastatin  (LIPITOR) 80 MG tablet Take 1 tablet (80 mg total) by mouth daily. (Patient taking differently: Take 80 mg by mouth every evening. ) 90 tablet 3  . augmented betamethasone dipropionate (DIPROLENE-AF) 0.05 % cream     . cephALEXin (KEFLEX) 500 MG capsule TK 1 C PO Q 8 H FOR 7 DAYS    . CLARITIN 10 MG tablet Take 10 mg by mouth daily.     . clopidogrel (PLAVIX) 75 MG tablet Take 1 tablet (75 mg total) by mouth every evening. 30 tablet 0  . finasteride (PROSCAR) 5 MG tablet Take 5 mg by mouth daily.     . Fluticasone Furoate-Vilanterol (BREO ELLIPTA) 100-25 MCG/INH AEPB Inhale 1 puff into the lungs daily.    . hydrochlorothiazide (HYDRODIURIL) 25 MG tablet Take 25 mg by mouth daily.     Marland Kitchen latanoprost (XALATAN) 0.005 % ophthalmic solution Place 1 drop into both eyes at bedtime.     . metFORMIN (GLUCOPHAGE) 1000 MG tablet Take 1,000 mg by mouth 2 (two) times daily.     . metoprolol tartrate (LOPRESSOR) 25 MG tablet Take 25 mg 2 (two) times daily by mouth.    . Multiple Vitamin (MULTIVITAMIN WITH MINERALS) TABS tablet Take 1 tablet by mouth daily. Centrum Silver    . Multiple Vitamins-Minerals (PRESERVISION AREDS 2 PO) Take 1 tablet by mouth 2 (two) times daily.    . nitroGLYCERIN (NITROSTAT) 0.4 MG SL tablet Place 1 tablet (0.4 mg total) under the tongue every 5 (five) minutes as  needed for chest pain. (Patient taking differently: Place 0.4 mg under the tongue every 5 (five) minutes x 3 doses as needed for chest pain. ) 25 tablet 6  . pantoprazole (PROTONIX) 40 MG tablet Take 40 mg daily by mouth.    Vladimir Faster Glycol-Propyl Glycol (SYSTANE) 0.4-0.3 % SOLN Place 1-2 drops into both eyes 3 (three) times daily as needed (dry/irritated eyes.).    Marland Kitchen ramipril (ALTACE) 10 MG capsule Take 10 mg by mouth 2 (two) times daily.     . traMADol (ULTRAM) 50 MG tablet Take 1 tablet (50 mg total) by mouth every 6 (six) hours as needed. 20 tablet 0  . VENTOLIN HFA 108 (90 Base) MCG/ACT inhaler Inhale 1-2 puffs into the  lungs every 6 (six) hours as needed for wheezing or shortness of breath.   0  . ZETIA 10 MG tablet Take 10 mg by mouth every evening.     . prochlorperazine (COMPAZINE) 10 MG tablet Take 1 tablet (10 mg total) by mouth every 6 (six) hours as needed for nausea or vomiting. 30 tablet 2   No current facility-administered medications for this visit.     SURGICAL HISTORY:  Past Surgical History:  Procedure Laterality Date  . ABDOMINAL AORTAGRAM Left 09/16/2013   Procedure: ABDOMINAL Maxcine Ham;  Surgeon: Elam Dutch, MD;  Location: [facility] CATH LAB;  Service: Cardiovascular;  Laterality: Left;  . CARDIAC CATHETERIZATION  01/2011   When there was segmental stenosis of the distal RCA, patent PCA stent, patent circumflex stent, and a patent small diagonal with 90% ISR.   Marland Kitchen COLONOSCOPY W/ BIOPSIES AND POLYPECTOMY    . CORONARY ANGIOPLASTY  may 2002   Non-DES stenting of his circumflex, non-DES stenting of the RCA  . HYDROCELE EXCISION / REPAIR  2009   by Dr Janice Norrie  . INGUINAL HERNIA REPAIR Bilateral    Dr Bubba Camp  . LYMPH NODE BIOPSY Left 01/10/2019   Procedure: left supraclavicular LYMPH NODE BIOPSY;  Surgeon: [name], MD;  Location: [facility];  Service: Thoracic;  Laterality: Left;  Marland Kitchen MULTIPLE TOOTH EXTRACTIONS    . NM MYOCAR PERF WALL MOTION  01/08/2011   moderate in size and intensity area of reversible ischemia in the basal to mid inferior and septal territories. Abnormal study  . stents  2008   proximal RCA, DES for progession of disease.  Marland Kitchen US ECHOCARDIOGRAPHY  01/12/2012   mild concentric LVH, borderline LA enlargement, mild to mod TR    REVIEW OF SYSTEMS:   Review of Systems  Constitutional: Negative for appetite change, chills, fatigue, fever and unexpected weight change.  HENT: Negative for mouth sores, nosebleeds, sore throat and trouble swallowing.   Eyes: Negative for eye problems and icterus.  Respiratory: Negative for cough, hemoptysis, shortness of breath and wheezing.    Cardiovascular: Negative for chest pain and leg swelling.  Gastrointestinal: Negative for abdominal pain, constipation, diarrhea, nausea and vomiting.  Genitourinary: Negative for bladder incontinence, difficulty urinating, dysuria, frequency and hematuria.   Musculoskeletal: Negative for back pain, gait problem, neck pain and neck stiffness.  Skin: Negative for itching and rash.  Neurological: Negative for dizziness, extremity weakness, gait problem, headaches, light-headedness and seizures.  Hematological: Negative for adenopathy. Does not bruise/bleed easily.  Psychiatric/Behavioral: Negative for confusion, depression and sleep disturbance. The patient is not nervous/anxious.     PHYSICAL EXAMINATION:  Blood pressure 121/82, pulse 77, temperature 98.3 F (36.8 C), temperature source Oral, resp. rate 18, height '5\' 9"'$  (1.753 m), weight 190  lb (86.2 kg), SpO2 99 %.  ECOG PERFORMANCE STATUS: 1 - Symptomatic but completely ambulatory  Physical Exam  Constitutional: Oriented to person, place, and time and well-developed, well-nourished, and in no distress. No distress.  HENT:  Head: Normocephalic and atraumatic.  Mouth/Throat: Oropharynx is clear and moist. No oropharyngeal exudate.  Eyes: Conjunctivae are normal. Right eye exhibits no discharge. Left eye exhibits no discharge. No scleral icterus.  Neck: Normal range of motion. Neck supple.  Cardiovascular: Normal rate, regular rhythm, normal heart sounds and intact distal pulses.   Pulmonary/Chest: Effort normal and breath sounds normal. No respiratory distress. No wheezes. No rales.  Abdominal: Soft. Bowel sounds are normal. Exhibits no distension and no mass. There is no tenderness.  Musculoskeletal: Normal range of motion. Exhibits no edema.  Lymphadenopathy:    No cervical adenopathy. Healing scar and swelling over the excisional biopsy site.  Neurological: Alert and oriented to person, place, and time. Exhibits normal muscle tone.  Gait normal. Coordination normal.  Skin: Skin is warm and dry. No rash noted. Not diaphoretic. No erythema. No pallor.  Psychiatric: Mood, memory and judgment normal.  Vitals reviewed.  LABORATORY DATA: Lab Results  Component Value Date   WBC 7.5 01/13/2019   HGB 13.0 01/13/2019   HCT 39.0 01/13/2019   MCV 96.5 01/13/2019   PLT 265 01/13/2019      Chemistry      Component Value Date/Time   NA 141 01/13/2019 1417   K 4.4 01/13/2019 1417   CL 106 01/13/2019 1417   CO2 25 01/13/2019 1417   BUN 19 01/13/2019 1417   CREATININE 1.16 01/13/2019 1417   CREATININE 0.94 05/16/2014 1109      Component Value Date/Time   CALCIUM 10.0 01/13/2019 1417   ALKPHOS 92 01/13/2019 1417   AST 17 01/13/2019 1417   ALT 24 01/13/2019 1417   BILITOT 0.3 01/13/2019 1417       RADIOGRAPHIC STUDIES:  Dg Chest 2 View  Result Date: 01/10/2019 CLINICAL DATA:  Adenopathy.  Preoperative exam. EXAM: CHEST - 2 VIEW COMPARISON:  CT 11/18/2018.  Chest x-ray 04/18/2014. FINDINGS: Mediastinum hilar structures normal. Heart size normal. No pulmonary venous congestion. Left upper lung lesion noted, best identified by prior CT. Mild left mid lung field subsegmental atelectasis and or scarring. No pleural effusion pneumothorax. No acute bony abnormality. IMPRESSION: 1. Small left upper lung lesion noted, best identified by prior recent CT. 2. Mild left mid lung field subsegmental atelectasis and or scarring. Electronically Signed   By: Marcello Moores  Register   On: 01/10/2019 06:37     ASSESSMENT/PLAN:  This is a very pleasant 73 year old African-American male with suspicious metastatic non-small cell lung cancer, adenocarcinoma.  He presented with left upper lobe lung mass in addition to left supraclavicular lymphadenopathy. He also has a suspicious lesion on the right adrenal gland and a muscular lesion along the right inferior ramus  The patient recently underwent a left supraclavicular lymph node excisional biopsy.    Dr. Julien Nordmann discussed results of the biopsy with patient today.  The patient's biopsy was consistent with non-small cell lung cancer, adenocarcinoma.  Dr. Julien Nordmann had a lengthy discussion with the patient today about his current condition and treatment options.  Dr. Julien Nordmann recommends that patient be referred to interventional radiology for consideration of a CT guided biopsy of the right adrenal gland to complete the staging work up. If the patient's adrenal gland is consistent with non-small cell lung cancer, then the patient will be stage IV.  Dr. Julien Nordmann recommends that the patient be treated with weekly concurrent chemoradiation with carboplatin for an AUC of 2 and paclitaxel 45 mg/m2.  The patient was interested in this option.  The patient is expected to start his first dose on January 31, 2019.  The adverse side effects of treatment were discussed including but not limited to alopecia, myelosuppression, nausea and vomiting, peripheral neuropathy, liver or renal dysfunction.   The patient will come for a follow-up visit in 2.5 weeks for evaluation before starting cycle #1 and to review his adrenal gland biopsy results.   I will arrange for the patient to have a chemoeducation class prior to his first dose of chemotherapy.   I will arrange for the patient to have a brain MRI to complete the staging workup  I have placed a referral to radiation oncology for evaluation.   The patient's tissue has been sent for foundation one and PDL1 testing.   I have sent a prescription for compazine 10 mg every 6 hours as needed for nausea to the patient's pharmacy.   The patient was advised to call immediately if he has any concerning symptoms in the interval. The patient voices understanding of current disease status and treatment options and is in agreement with the current care plan. All questions were answered. The patient knows to call the clinic with any problems, questions or concerns. We  can certainly see the patient much sooner if necessary   Orders Placed This Encounter  Procedures  . CT Biopsy    Standing Status:   Future    Standing Expiration Date:   01/13/2020    Scheduling Instructions:     Discussed at thoracic conference 01/13/2019     Attention Dr. Anselm Pancoast    Order Specific Question:   Lab orders requested (DO NOT place separate lab orders, these will be automatically ordered during procedure specimen collection):    Answer:   Surgical Pathology    Order Specific Question:   Reason for Exam (SYMPTOM  OR DIAGNOSIS REQUIRED)    Answer:   Right Adrenal Gland    Order Specific Question:   Preferred location?    Answer:   [facility]    Order Specific Question:   Radiology Contrast Protocol - do NOT remove file path    Answer:   \\[email or web address]  . MR Brain W Wo Contrast    Standing Status:   Future    Standing Expiration Date:   01/13/2020    Order Specific Question:   ** REASON FOR EXAM (FREE TEXT)    Answer:   Restaging Lung Cancer    Order Specific Question:   If indicated for the ordered procedure, I authorize the administration of contrast media per Radiology protocol    Answer:   Yes    Order Specific Question:   What is the patient's sedation requirement?    Answer:   No Sedation    Order Specific Question:   Does the patient have a pacemaker or implanted devices?    Answer:   No    Order Specific Question:   Use SRS Protocol?    Answer:   No    Order Specific Question:   Radiology Contrast Protocol - do NOT remove file path    Answer:   \\[email or web address]    Order Specific Question:   Preferred imaging location?    Answer:   [facility] (table limit-350 lbs)  . CBC with Differential ([place])  Standing Status:   Standing    Number of Occurrences:   7    Standing Expiration Date:   01/13/2020  . CMP ([facility] only)    Standing Status:   Standing    Number of  Occurrences:   7    Standing Expiration Date:   01/13/2020  . Ambulatory referral to Radiation Oncology    Referral Priority:   Urgent    Referral Type:   Consultation    Referral Reason:   Specialty Services Required    Requested Specialty:   Radiation Oncology    Number of Visits Requested:   [place], PA-C 01/13/19  ADDENDUM: Hematology/Oncology Attending: I had a face-to-face encounter with the patient today.  I recommended his care plan.  This is a very pleasant 73 years old African-American male recently diagnosed with a stage IIIb/IV (T1b, N3,M0/M1c) non-small cell lung cancer, adenocarcinoma presented with left upper lobe lung mass in addition to left supraclavicular lymphadenopathy and suspicious lesion in the right adrenal gland. I had a lengthy discussion with the patient today about his current disease stage, prognosis and treatment options.  I recommended for the patient to proceed with CT-guided core biopsy of the right adrenal gland to rule out metastatic disease in that area. If the patient has no evidence of metastatic disease in the adrenal gland, he will be treated with a course of concurrent chemoradiation with weekly carboplatin for AUC of 2 and paclitaxel 45 mg/M2 followed by consolidation immunotherapy. I discussed with the patient the adverse effect of this treatment including but not limited to alopecia, myelosuppression, nausea and vomiting, peripheral neuropathy, liver or renal dysfunction. I will refer the patient to radiation oncology for consideration of discussion of the concurrent radiotherapy portion of his treatment. In case the right adrenal gland become metastatic, the patient may continue on the same course of concurrent chemoradiation in addition to palliative radiotherapy to the solitary metastatic lesion in the right adrenal gland. The patient is in agreement with the current plan. He is expected to start the first cycle of this  treatment on 01/31/2019 and he will come back for follow-up visit at that time. I will complete the staging work-up by ordering MRI of the brain to rule out brain metastasis.  I will also request his tissue block to be sent to foundation [facility] for molecular studies and PDL 1 expression. The patient was advised to call immediately if he has any concerning symptoms in the interval.  Disclaimer: This note was dictated with voice recognition software. Similar sounding words can inadvertently be transcribed and may be missed upon review. Eilleen Kempf, MD 01/13/19

## 2019-01-14 ENCOUNTER — Ambulatory Visit: Payer: Medicare Other | Admitting: Thoracic Surgery (Cardiothoracic Vascular Surgery)

## 2019-01-14 ENCOUNTER — Encounter: Payer: Self-pay | Admitting: Thoracic Surgery (Cardiothoracic Vascular Surgery)

## 2019-01-14 VITALS — BP 130/82 | HR 77 | Temp 97.6°F | Resp 20 | Ht 69.0 in | Wt 191.0 lb

## 2019-01-14 DIAGNOSIS — R911 Solitary pulmonary nodule: Secondary | ICD-10-CM

## 2019-01-14 DIAGNOSIS — R590 Localized enlarged lymph nodes: Secondary | ICD-10-CM

## 2019-01-14 DIAGNOSIS — Z9889 Other specified postprocedural states: Secondary | ICD-10-CM

## 2019-01-14 NOTE — Progress Notes (Signed)
      [address]       [place] [address]             [phone]        Trevor Bailey  Medical Record #[id number] Date of Birth: November 06, 1945  Referring: [name], MD Primary Care: [name], MD Primary Cardiologist:Thomas Claiborne Billings, MD  Reason for visit:   follow-up  History of Present Illness:     S/p right supraclavicular  lymph node biopsy.  Biopsy did show adenocarcinoma.  He admits to some fullness over the incision, but denies any pain.    Physical Exam: BP 130/82   Pulse 77   Temp 97.6 F (36.4 C) (Skin)   Resp 20   Ht 5\' 9"  (1.753 m)   Wt 191 lb (86.6 kg)   SpO2 97% Comment: RA  BMI 28.21 kg/m   Alert NAD RRR.  Incision clean.  Some fullness.  Nontender, no fluctuance   Diagnostic Studies & Laboratory data: Path: adenocarcinoma - nonspecific for lung primary    Assessment / Plan:   73 yo male with 2cm left upper lobe pulmonary nodule, right adrenal nodule, and right pubic ramus pet avidity, and evidence of metastatic adenocarcinoma in a left supraclavicular lymph node.  He is scheduled to a CT guided biopsy of the adrenal nodule for further staging work-up.  I will see him back in 1 month for a wound check.   Trevor Bailey 01/14/2019 1:37 PM

## 2019-01-17 NOTE — Progress Notes (Signed)
Thoracic Location of Tumor / Histology: Mass of upper lobe of left lung-Non small cell  Patient presented with 18 pound weight loss over 2 months.  -left upper lobe lung mass in addition to left supraclavicular lymphadenopathy.  He also has a suspicious lesion on the right adrenal gland and a muscular lesion along the right inferior ramus.  CT biopsy right adrenal gland 01/28/2019  MRI Brain 01/20/2019:  PET 12/06/2018: Left supraclavicular lymph node measures 1.9 x 2.6 cm with an SUV max 13.  2 adjacent nodules in the posterior aspect of left upper lobe measure up to 1.0 x 1.4 cm with an SUV max of 4.3.  No hypermetabolic mediastinal, hilar or axillary lymph nodes.  Right adrenal nodule measures 1.6 x 2.6 cm with an SUV max of 8.8. No additional abnormal hypermetabolism in the liver, left adrenal gland, spleen or pancreas. No hypermetabolic lymph nodes.     CT Chest 11/18/2018: Lobulated 2 cm left upper lobe lung lesion is worrisome for primary lung neoplasm.  No pathologically enlarged mediastinal or hilar lymph nodes but there is an indeterminate 2 cm right adrenal gland lesion worrisome for metastasis.  Biopsies of Left supraclavicular lymph node 01/10/2019    Tobacco/Marijuana/Snuff/ETOH use: Former smoker  Past/Anticipated interventions by cardiothoracic surgery, if any:    Past/Anticipated interventions by medical oncology, if any:  Dr. Margaretmary Eddy Heilingoetter 01/13/2019 -The patient underwent a left supraclavicular lymph node excisional biopsy and it was consistent with non-small cell lung cancer, adenocarcinoma. -Recommends referral to interventional radiology for consideration of a CT guided biopsy of the right adrenal gland to complete the staging work up. -If the patient's adrenal gland is consistent with non-small cell lung cancer, then the patient will be stage IV. -Weekly concurrent chemoradiation with carboplatin for an AUC of 2 and paclitaxel 45 mg/m2.  First dose expected on  01/31/2019. -I will arrange for the patient to have a brain MRI to complete staging workup. -I have placed a referral to radiation oncology for discussion of concurrent radiotherapy portion of his treatment. -In case the right adrenal gland become metastatic, the patient may continue on the same course of concurrent chemoradiation in addition to palliative radiotherapy to the solitary metastatic lesion in the right adrenal gland.   Signs/Symptoms  Weight changes, if any: No  Respiratory complaints, if any: No  Hemoptysis, if any: Occasional productive cough of clear/yellow phlegm, no blood noted.  Pain issues, if any:  No  SAFETY ISSUES:  Prior radiation? No  Pacemaker/ICD? No  Possible current pregnancy? n/a  Is the patient on methotrexate? No  Current Complaints / other details:

## 2019-01-18 ENCOUNTER — Ambulatory Visit
Admission: RE | Admit: 2019-01-18 | Discharge: 2019-01-18 | Disposition: A | Discharge status: Home / Self Care | Payer: Medicare Other | Source: Ambulatory Visit | Attending: Radiation Oncology | Admitting: Radiation Oncology

## 2019-01-18 ENCOUNTER — Encounter: Payer: Self-pay | Admitting: Radiation Oncology

## 2019-01-18 ENCOUNTER — Other Ambulatory Visit: Payer: Self-pay

## 2019-01-18 VITALS — Ht 69.0 in | Wt 190.0 lb

## 2019-01-18 DIAGNOSIS — C3492 Malignant neoplasm of unspecified part of left bronchus or lung: Secondary | ICD-10-CM

## 2019-01-18 NOTE — Progress Notes (Signed)
Radiation Oncology         [phone] ________________________________  Initial Outpatient Consultation - Conducted via telephone due to current COVID-19 concerns for limiting patient exposure  I spoke with the patient to conduct this consult visit via telephone to spare the patient unnecessary potential exposure in the healthcare setting during the current COVID-19 pandemic. The patient was notified in advance and was offered a [place] meeting to allow for face to face communication but unfortunately reported that they did not have the appropriate resources/technology to support such a visit and instead preferred to proceed with a telephone consult. ________________________________  Name: Trevor Bailey        MRN: [id number]  Date of Service: 01/18/2019 DOB: [birth year]  ZW:CHENID, Jori Moll, MD  Heilingoetter, Cassandr*     REFERRING PHYSICIAN: Heilingoetter, Cassandr*   DIAGNOSIS: The encounter diagnosis was Adenocarcinoma of left lung, stage 3 ([place]).   HISTORY OF PRESENT ILLNESS: Trevor Bailey is a 73 y.o. male seen at the request of Dr. Julien Nordmann for a newly diagnosed non-small cell lung cancer.  Patient had approximately 18 pounds of weight loss over approximately 2 months, and was identified as having a left upper lobe nodule on imaging.  But the CT that identified this was performed on 11/18/2018 revealing a 2 cm left upper lobe nodule with no evidence of adenopathy.  He did have an indeterminate change in the right adrenal gland measuring 2 cm, and had not been present on CT scan from 2016.  There was also stable left adrenal nodularity and simple appearing renal cysts.  He proceeded with pet imaging on 12/06/2018 which revealed hypermetabolic activity within the left supraclavicular lymph node that measured 1.9 x 2.6 cm with an SUV of 13.2, as well as adjacent nodules in the left upper lobe measuring up to 1 x 1.4 cm with an SUV of 4.3, the right adrenal nodule seen on CT scan measured 1.6 x 2.6  cm with an SUV of 8.8, no additional hypermetabolic change was seen in the liver or other solid organs, there was a hyperdense 1.2 cm nodule in the inferior musculature of the pubic ramus with an SUV of 6 and hypermetabolic activity within a partially calcified and nodular appearing prostate.  No bony disease was identified.  He underwent left supraclavicular lymph node biopsy on 824 revealing adenocarcinoma consistent with a primary lung diagnosis.  He is scheduled to undergo a biopsy of the right adrenal gland on 01/28/2019, as well as having an MRI of the brain on 01/20/2019.  Given his overall performance status he has met with Dr. Ivin Poot and is felt to be a good candidate for chemoradiation to the lung, with the possibility of stereotactic body radiotherapy to the adrenal gland if this was also positive.  He is scheduled to begin chemotherapy on 01/31/2019.     PREVIOUS RADIATION THERAPY: No   PAST MEDICAL HISTORY:  Past Medical History:  Diagnosis Date   Adenopathy    left supraclavicular lymph node   Allergies    COPD (chronic obstructive pulmonary disease) (HCC)    Coronary disease    Diabetes (HCC)    Enlarged prostate    GERD (gastroesophageal reflux disease)    Glaucoma    Hyperlipidemia    Hypertension    Myocardial infarction ([place])    Sleep apnea    wears CPAP   Wears partial dentures    upper and lower       PAST SURGICAL HISTORY: Past Surgical History:  Procedure Laterality Date   ABDOMINAL AORTAGRAM Left 09/16/2013   Procedure: ABDOMINAL Maxcine Ham;  Surgeon: Elam Dutch, MD;  Location: [facility] CATH LAB;  Service: Cardiovascular;  Laterality: Left;   CARDIAC CATHETERIZATION  01/2011   When there was segmental stenosis of the distal RCA, patent PCA stent, patent circumflex stent, and a patent small diagonal with 90% ISR.    COLONOSCOPY W/ BIOPSIES AND POLYPECTOMY     CORONARY ANGIOPLASTY  may 2002   Non-DES stenting of his circumflex, non-DES stenting  of the RCA   HYDROCELE EXCISION / REPAIR  2009   by Dr Jackquline Bosch HERNIA REPAIR Bilateral    Dr Bubba Camp   LYMPH NODE BIOPSY Left 01/10/2019   Procedure: left supraclavicular LYMPH NODE BIOPSY;  Surgeon: [name], MD;  Location: [place];  Service: Thoracic;  Laterality: Left;   MULTIPLE TOOTH EXTRACTIONS     NM [place]  01/08/2011   moderate in size and intensity area of reversible ischemia in the basal to mid inferior and septal territories. Abnormal study   stents  2008   proximal RCA, DES for progession of disease.   US ECHOCARDIOGRAPHY  01/12/2012   mild concentric LVH, borderline LA enlargement, mild to mod TR     FAMILY HISTORY:  Family History  Problem Relation Age of Onset   Heart attack Mother    Heart disease Mother    Diabetes Father    Stroke Father    Hypertension Father    Cancer Sister    Hyperlipidemia Sister    Hypertension Sister      SOCIAL HISTORY:  reports that he has quit smoking. His smoking use included cigarettes. He has a 5.00 pack-year smoking history. He has never used smokeless tobacco. He reports current alcohol use. He reports that he does not use drugs. The patient is married and lives in [place]. He's trying to get into golfing with his grandson. He's a retired Engineer, civil (consulting).    ALLERGIES: Patient has no known allergies.   MEDICATIONS:  Current Outpatient Medications  Medication Sig Dispense Refill   amLODipine (NORVASC) 5 MG tablet Take 1 tablet (5 mg total) by mouth daily. 90 tablet 3   aspirin EC 81 MG tablet Take 81 mg by mouth daily.     atorvastatin (LIPITOR) 80 MG tablet Take 1 tablet (80 mg total) by mouth daily. (Patient taking differently: Take 80 mg by mouth every evening. ) 90 tablet 3   augmented betamethasone dipropionate (DIPROLENE-AF) 0.05 % cream      cephALEXin (KEFLEX) 500 MG capsule TK 1 C PO Q 8 H FOR 7 DAYS     CLARITIN 10 MG tablet Take 10 mg by mouth daily.        clopidogrel (PLAVIX) 75 MG tablet Take 1 tablet (75 mg total) by mouth every evening. 30 tablet 0   finasteride (PROSCAR) 5 MG tablet Take 5 mg by mouth daily.      Fluticasone Furoate-Vilanterol (BREO ELLIPTA) 100-25 MCG/INH AEPB Inhale 1 puff into the lungs daily.     hydrochlorothiazide (HYDRODIURIL) 25 MG tablet Take 25 mg by mouth daily.      latanoprost (XALATAN) 0.005 % ophthalmic solution Place 1 drop into both eyes at bedtime.      metFORMIN (GLUCOPHAGE) 1000 MG tablet Take 1,000 mg by mouth 2 (two) times daily.      metoprolol tartrate (LOPRESSOR) 25 MG tablet Take 25 mg 2 (two) times daily by mouth.  Multiple Vitamin (MULTIVITAMIN WITH MINERALS) TABS tablet Take 1 tablet by mouth daily. Centrum Silver     Multiple Vitamins-Minerals (PRESERVISION AREDS 2 PO) Take 1 tablet by mouth 2 (two) times daily.     nitroGLYCERIN (NITROSTAT) 0.4 MG SL tablet Place 1 tablet (0.4 mg total) under the tongue every 5 (five) minutes as needed for chest pain. (Patient taking differently: Place 0.4 mg under the tongue every 5 (five) minutes x 3 doses as needed for chest pain. ) 25 tablet 6   pantoprazole (PROTONIX) 40 MG tablet Take 40 mg daily by mouth.     Polyethyl Glycol-Propyl Glycol (SYSTANE) 0.4-0.3 % SOLN Place 1-2 drops into both eyes 3 (three) times daily as needed (dry/irritated eyes.).     prochlorperazine (COMPAZINE) 10 MG tablet Take 1 tablet (10 mg total) by mouth every 6 (six) hours as needed for nausea or vomiting. 30 tablet 2   ramipril (ALTACE) 10 MG capsule Take 10 mg by mouth 2 (two) times daily.      traMADol (ULTRAM) 50 MG tablet Take 1 tablet (50 mg total) by mouth every 6 (six) hours as needed. 20 tablet 0   VENTOLIN HFA 108 (90 Base) MCG/ACT inhaler Inhale 1-2 puffs into the lungs every 6 (six) hours as needed for wheezing or shortness of breath.   0   ZETIA 10 MG tablet Take 10 mg by mouth every evening.      No current facility-administered medications for  this encounter.      REVIEW OF SYSTEMS: On review of systems, the patient reports that he is doing well overall. He denies any chest pain, shortness of breath, cough, fevers, chills, night sweats. He has had about 18 pounds of unintended weight changes in the last 2 months, but has put his weight back on in the last few weeks. He denies any bowel or bladder disturbances, and denies abdominal pain, nausea or vomiting. He denies any new musculoskeletal or joint aches or pains. A complete review of systems is obtained and is otherwise negative.     PHYSICAL EXAM:  Wt Readings from Last 3 Encounters:  01/14/19 191 lb (86.6 kg)  01/13/19 190 lb (86.2 kg)  01/10/19 190 lb 9.6 oz (86.5 kg)  Unable to assess due to encounter type.   ECOG = 0  0 - Asymptomatic (Fully active, able to carry on all predisease activities without restriction)  1 - Symptomatic but completely ambulatory (Restricted in physically strenuous activity but ambulatory and able to carry out work of a light or sedentary nature. For example, light housework, office work)  2 - Symptomatic, <50% in bed during the day (Ambulatory and capable of all self care but unable to carry out any work activities. Up and about more than 50% of waking hours)  3 - Symptomatic, >50% in bed, but not bedbound (Capable of only limited self-care, confined to bed or chair 50% or more of waking hours)  4 - Bedbound (Completely disabled. Cannot carry on any self-care. Totally confined to bed or chair)  5 - Death   Eustace Pen MM, Creech RH, Tormey DC, et al. [phone]). "Toxicity and response criteria of the [facility]". [place] Oncol. 5 (6): 649-55    LABORATORY DATA:  Lab Results  Component Value Date   WBC 7.5 01/13/2019   HGB 13.0 01/13/2019   HCT 39.0 01/13/2019   MCV 96.5 01/13/2019   PLT 265 01/13/2019   Lab Results  Component Value Date   NA  141 01/13/2019   K 4.4 01/13/2019   CL 106 01/13/2019   CO2 25  01/13/2019   Lab Results  Component Value Date   ALT 24 01/13/2019   AST 17 01/13/2019   ALKPHOS 92 01/13/2019   BILITOT 0.3 01/13/2019      RADIOGRAPHY: Dg Chest 2 View  Result Date: 01/10/2019 CLINICAL DATA:  Adenopathy.  Preoperative exam. EXAM: CHEST - 2 VIEW COMPARISON:  CT 11/18/2018.  Chest x-ray 04/18/2014. FINDINGS: Mediastinum hilar structures normal. Heart size normal. No pulmonary venous congestion. Left upper lung lesion noted, best identified by prior CT. Mild left mid lung field subsegmental atelectasis and or scarring. No pleural effusion pneumothorax. No acute bony abnormality. IMPRESSION: 1. Small left upper lung lesion noted, best identified by prior recent CT. 2. Mild left mid lung field subsegmental atelectasis and or scarring. Electronically Signed   By: Marcello Moores  Register   On: 01/10/2019 06:37       IMPRESSION/PLAN: 1. Stage IIIB/IV, T1bN3M0/M1c NSCLC, adenocarcinoma of the LUL with left supraclavicular disease, and possible right adrenal disease. Dr. [name] discusses the pathology findings and reviews the nature of advanced lung cancer. His performance status is quite good and he's asymptomatic despite the findings. His weight loss has also reversed, and he's back to his baseline. We reviewed the discussion in conference about proceeding with an MRI of the brain, and considering chemoRT to the chest. He will also have his right adrenal gland biopsy on 01/28/2019 to rule out disease. He discusses that if this was involved, we could also consider treatment with stereotactic body radiotherapy. We discussed the risks, benefits, short, and long term effects of radiotherapy, and the patient is interested in proceeding. Dr. [name] discusses the delivery and logistics of radiotherapy and anticipates a course of 6 1/2 weeks of radiotherapy. We will plan simulation on Thursday of this week with the anticipating of starting chemoRT on 01/31/2019 to the chest. We will determine the process  of moving forward as well following the results of his adrenal biopsy and MRI work up. The patient is in agreement with this plan. 2. PET positive findings in the musculature of the inferior pubic rami and prostate. We will follow this expectantly and copy Dr. Gloriann Loan at [facility], though the patient does not recall any history of elevated PSA or prior prostate biopsy.     Given current concerns for patient exposure during the COVID-19 pandemic, this encounter was conducted via telephone.  The patient has given verbal consent for this type of encounter. The time spent during this encounter was 45 minutes and 50% of that time was spent in the coordination of his care. The attendants for this meeting included Blenda Nicely, RN, Dr. [name], Shona Simpson, [facility] and [employer] [place]  During the encounter, Blenda Nicely, RN, Dr. [name], Ardelle Anton [facility] were located at [facility] Radiation Oncology Department.  Trevor Bailey  was located at home.   The above documentation reflects my direct findings during this shared patient visit. Please see the separate note by Dr. [name] on this date for the remainder of the patient's plan of care.    Carola Rhine, PAC

## 2019-01-19 ENCOUNTER — Telehealth: Payer: Self-pay | Admitting: Internal Medicine

## 2019-01-19 NOTE — Telephone Encounter (Signed)
Called pt to confirm this weeks appts per 8/27 los. Pt is aware

## 2019-01-20 ENCOUNTER — Encounter: Payer: Self-pay | Admitting: Internal Medicine

## 2019-01-20 ENCOUNTER — Other Ambulatory Visit: Payer: Self-pay

## 2019-01-20 ENCOUNTER — Ambulatory Visit (HOSPITAL_COMMUNITY)
Admission: RE | Admit: 2019-01-20 | Discharge: 2019-01-20 | Disposition: A | Discharge status: Home / Self Care | Payer: Medicare Other | Source: Ambulatory Visit | Attending: Physician Assistant | Admitting: Physician Assistant

## 2019-01-20 ENCOUNTER — Ambulatory Visit
Admission: RE | Admit: 2019-01-20 | Discharge: 2019-01-20 | Disposition: A | Discharge status: Home / Self Care | Payer: Medicare Other | Source: Ambulatory Visit | Attending: Radiation Oncology | Admitting: Radiation Oncology

## 2019-01-20 ENCOUNTER — Inpatient Hospital Stay: Payer: Medicare Other | Attending: Internal Medicine

## 2019-01-20 DIAGNOSIS — Z510 Encounter for antineoplastic radiation therapy: Secondary | ICD-10-CM | POA: Diagnosis not present

## 2019-01-20 DIAGNOSIS — R918 Other nonspecific abnormal finding of lung field: Secondary | ICD-10-CM | POA: Insufficient documentation

## 2019-01-20 DIAGNOSIS — Z79899 Other long term (current) drug therapy: Secondary | ICD-10-CM | POA: Insufficient documentation

## 2019-01-20 DIAGNOSIS — C7971 Secondary malignant neoplasm of right adrenal gland: Secondary | ICD-10-CM | POA: Insufficient documentation

## 2019-01-20 DIAGNOSIS — Z5111 Encounter for antineoplastic chemotherapy: Secondary | ICD-10-CM | POA: Insufficient documentation

## 2019-01-20 DIAGNOSIS — I2510 Atherosclerotic heart disease of native coronary artery without angina pectoris: Secondary | ICD-10-CM | POA: Insufficient documentation

## 2019-01-20 DIAGNOSIS — C3412 Malignant neoplasm of upper lobe, left bronchus or lung: Secondary | ICD-10-CM | POA: Diagnosis present

## 2019-01-20 MED ORDER — GADOBUTROL 1 MMOL/ML IV SOLN
10.0000 mL | Freq: Once | INTRAVENOUS | Status: AC | PRN
  Administered 2019-01-20: 9 mL via INTRAVENOUS

## 2019-01-20 NOTE — Progress Notes (Signed)
Met with patient at registration to introduce myself as Arboriculturist and to offer available resources.  There are no foundations with available funds for copay assistance for his diagnosis.  Discussed one-time $7 Engineer, drilling to assist with personal expenses while going through treatment.  Gave him my card if interested in applying and for any additional financial questions or concerns.

## 2019-01-25 ENCOUNTER — Ambulatory Visit (HOSPITAL_COMMUNITY)
Admission: RE | Admit: 2019-01-25 | Discharge: 2019-01-25 | Disposition: A | Discharge status: Home / Self Care | Payer: Medicare Other | Source: Ambulatory Visit | Attending: Physician Assistant | Admitting: Physician Assistant

## 2019-01-25 DIAGNOSIS — Z20828 Contact with and (suspected) exposure to other viral communicable diseases: Secondary | ICD-10-CM | POA: Diagnosis present

## 2019-01-25 DIAGNOSIS — Z01812 Encounter for preprocedural laboratory examination: Secondary | ICD-10-CM | POA: Diagnosis not present

## 2019-01-26 ENCOUNTER — Encounter (HOSPITAL_COMMUNITY): Payer: Self-pay | Admitting: Internal Medicine

## 2019-01-26 DIAGNOSIS — C3412 Malignant neoplasm of upper lobe, left bronchus or lung: Secondary | ICD-10-CM | POA: Diagnosis not present

## 2019-01-26 LAB — NOVEL CORONAVIRUS, NAA (HOSP ORDER, SEND-OUT TO REF LAB; TAT 18-24 HRS): SARS-CoV-2, NAA: NOT DETECTED

## 2019-01-27 ENCOUNTER — Other Ambulatory Visit: Payer: Self-pay | Admitting: Radiology

## 2019-01-27 ENCOUNTER — Encounter (HOSPITAL_COMMUNITY): Payer: Self-pay | Admitting: Internal Medicine

## 2019-01-28 ENCOUNTER — Encounter (HOSPITAL_COMMUNITY): Payer: Self-pay

## 2019-01-28 ENCOUNTER — Ambulatory Visit (HOSPITAL_COMMUNITY)
Admission: RE | Admit: 2019-01-28 | Discharge: 2019-01-28 | Disposition: A | Discharge status: Home / Self Care | Payer: Medicare Other | Source: Ambulatory Visit | Attending: Physician Assistant | Admitting: Physician Assistant

## 2019-01-28 ENCOUNTER — Other Ambulatory Visit: Payer: Self-pay

## 2019-01-28 DIAGNOSIS — Z955 Presence of coronary angioplasty implant and graft: Secondary | ICD-10-CM | POA: Diagnosis not present

## 2019-01-28 DIAGNOSIS — Z7982 Long term (current) use of aspirin: Secondary | ICD-10-CM | POA: Diagnosis not present

## 2019-01-28 DIAGNOSIS — G4730 Sleep apnea, unspecified: Secondary | ICD-10-CM | POA: Diagnosis not present

## 2019-01-28 DIAGNOSIS — Z823 Family history of stroke: Secondary | ICD-10-CM | POA: Insufficient documentation

## 2019-01-28 DIAGNOSIS — R918 Other nonspecific abnormal finding of lung field: Secondary | ICD-10-CM | POA: Diagnosis present

## 2019-01-28 DIAGNOSIS — Z79899 Other long term (current) drug therapy: Secondary | ICD-10-CM | POA: Diagnosis not present

## 2019-01-28 DIAGNOSIS — Z833 Family history of diabetes mellitus: Secondary | ICD-10-CM | POA: Diagnosis not present

## 2019-01-28 DIAGNOSIS — Z87891 Personal history of nicotine dependence: Secondary | ICD-10-CM | POA: Insufficient documentation

## 2019-01-28 DIAGNOSIS — Z8249 Family history of ischemic heart disease and other diseases of the circulatory system: Secondary | ICD-10-CM | POA: Insufficient documentation

## 2019-01-28 DIAGNOSIS — K219 Gastro-esophageal reflux disease without esophagitis: Secondary | ICD-10-CM | POA: Diagnosis not present

## 2019-01-28 DIAGNOSIS — I252 Old myocardial infarction: Secondary | ICD-10-CM | POA: Diagnosis not present

## 2019-01-28 DIAGNOSIS — Z7984 Long term (current) use of oral hypoglycemic drugs: Secondary | ICD-10-CM | POA: Diagnosis not present

## 2019-01-28 DIAGNOSIS — Z7902 Long term (current) use of antithrombotics/antiplatelets: Secondary | ICD-10-CM | POA: Insufficient documentation

## 2019-01-28 DIAGNOSIS — J449 Chronic obstructive pulmonary disease, unspecified: Secondary | ICD-10-CM | POA: Diagnosis not present

## 2019-01-28 DIAGNOSIS — E119 Type 2 diabetes mellitus without complications: Secondary | ICD-10-CM | POA: Diagnosis not present

## 2019-01-28 DIAGNOSIS — E785 Hyperlipidemia, unspecified: Secondary | ICD-10-CM | POA: Diagnosis not present

## 2019-01-28 DIAGNOSIS — C801 Malignant (primary) neoplasm, unspecified: Secondary | ICD-10-CM | POA: Insufficient documentation

## 2019-01-28 DIAGNOSIS — C7971 Secondary malignant neoplasm of right adrenal gland: Secondary | ICD-10-CM | POA: Diagnosis not present

## 2019-01-28 DIAGNOSIS — I10 Essential (primary) hypertension: Secondary | ICD-10-CM | POA: Diagnosis not present

## 2019-01-28 DIAGNOSIS — I2510 Atherosclerotic heart disease of native coronary artery without angina pectoris: Secondary | ICD-10-CM | POA: Insufficient documentation

## 2019-01-28 DIAGNOSIS — Z7951 Long term (current) use of inhaled steroids: Secondary | ICD-10-CM | POA: Insufficient documentation

## 2019-01-28 DIAGNOSIS — Z803 Family history of malignant neoplasm of breast: Secondary | ICD-10-CM | POA: Insufficient documentation

## 2019-01-28 LAB — CBC
HCT: 38.3 % — ABNORMAL LOW (ref 39.0–52.0)
Hemoglobin: 12.7 g/dL — ABNORMAL LOW (ref 13.0–17.0)
MCH: 32.2 pg (ref 26.0–34.0)
MCHC: 33.2 g/dL (ref 30.0–36.0)
MCV: 97 fL (ref 80.0–100.0)
Platelets: 294 10*3/uL (ref 150–400)
RBC: 3.95 MIL/uL — ABNORMAL LOW (ref 4.22–5.81)
RDW: 12.2 % (ref 11.5–15.5)
WBC: 5.9 10*3/uL (ref 4.0–10.5)
nRBC: 0 % (ref 0.0–0.2)

## 2019-01-28 LAB — PROTIME-INR
INR: 1 (ref 0.8–1.2)
Prothrombin Time: 13.1 seconds (ref 11.4–15.2)

## 2019-01-28 LAB — GLUCOSE, CAPILLARY: Glucose-Capillary: 115 mg/dL — ABNORMAL HIGH (ref 70–99)

## 2019-01-28 MED ORDER — MIDAZOLAM HCL 2 MG/2ML IJ SOLN
INTRAMUSCULAR | Status: AC
  Filled 2019-01-28: qty 2

## 2019-01-28 MED ORDER — SODIUM CHLORIDE 0.9 % IV SOLN: INTRAVENOUS | Status: DC

## 2019-01-28 MED ORDER — MIDAZOLAM HCL 2 MG/2ML IJ SOLN
INTRAMUSCULAR | Status: DC | PRN
  Administered 2019-01-28 (×3): 1 mg via INTRAVENOUS

## 2019-01-28 MED ORDER — FENTANYL CITRATE (PF) 100 MCG/2ML IJ SOLN
INTRAMUSCULAR | Status: DC | PRN
  Administered 2019-01-28: 50 ug via INTRAVENOUS

## 2019-01-28 MED ORDER — FENTANYL CITRATE (PF) 100 MCG/2ML IJ SOLN
INTRAMUSCULAR | Status: AC
  Filled 2019-01-28: qty 2

## 2019-01-28 MED ORDER — LIDOCAINE HCL 1 % IJ SOLN
INTRAMUSCULAR | Status: AC
  Filled 2019-01-28: qty 20

## 2019-01-28 NOTE — Progress Notes (Addendum)
  Radiation Oncology         [phone] ________________________________  Name: Trevor Bailey MRN: [id number]  Date: 01/20/2019  DOB: [birth year]  SIMULATION AND TREATMENT PLANNING NOTE  DIAGNOSIS:     ICD-10-CM   1. Primary malignant neoplasm of bronchus of left upper lobe (HCC)  C34.12      Site:  chest  NARRATIVE:  The patient was brought to the [facility].  Identity was confirmed.  All relevant records and images related to the planned course of therapy were reviewed.   Written consent to proceed with treatment was confirmed which was freely given after reviewing the details related to the planned course of therapy had been reviewed with the patient.  Then, the patient was set-up in a stable reproducible  supine position for radiation therapy.  CT images were obtained.  Surface markings were placed.    Medically necessary complex treatment device(s) for immobilization:  Vac-lock bag.   The CT images were loaded into the planning software.  Then the target and avoidance structures were contoured.  Treatment planning then occurred.  The radiation prescription was entered and confirmed.  A total of 4 complex treatment devices were fabricated which relate to the designed radiation treatment fields. Additional reduced fields will be used as necessary to improve the dose homogeneity of the plan. Each of these customized fields/ complex treatment devices will be used on a daily basis during the radiation course. I have requested : 3D Simulation  I have requested a DVH of the following structures: target volume, spinal cord, lungs, heart.   The patient will undergo daily image guidance to ensure accurate localization of the target, and adequate minimize dose to the normal surrounding structures in close proximity to the target.  PLAN:  The patient will receive 60 Gy in 30 fractions initially. The patient will then receive a 6 Gy boost for a final dose of 66 Gy.    Special  treatment procedure The patient will also receive concurrent chemotherapy during the treatment. The patient may therefore experience increased toxicity or side effects and the patient will be monitored for such problems. This may require extra lab work as necessary. This therefore constitutes a special treatment procedure.   ________________________________   Jodelle Gross, MD, PhD

## 2019-01-28 NOTE — H&P (Addendum)
Chief Complaint: Right adrenal  mass  Referring Physician(s): Casselton L  Supervising Physician: Corrie Mckusick  Patient Status: [facility] - Out-pt  History of Present Illness: Trevor Bailey is a 73 y.o. male with a past medical history significant for COPD, hypertension, dyslipidemia, diabetes mellitus, coronary artery disease with h/o MI.  He also has a long history of smoking.    He was seen by his PCP complaining of significant weight loss of around 18 pounds over 2 months.    He had a CT of the chest on November 18, 2018 and that showed a lobulated 2.0 cm left upper lobe lesion worrisome for primary lung neoplasm.    There was an indeterminate 2.0 cm right adrenal gland lesion concerning for metastasis.    A PET scan done on December 06, 2018 showed a right adrenal nodule which measured 1.6 x 2.6 cm with SUV max of 8.8.   The PET scan also showed hypermetabolic and nodular appearing prostate and malignancy was not excluded.  We are asked to evaluate him for a biopsy.  The right adrenal nodule was approved for image guided biopsy.  He drank coffee with cream at 7 am so we will plan for procedure around 1 pm.  He has held his Plavix x 7 days. No nausea/vomiting. No Fever/chills. ROS negative.   Past Medical History:  Diagnosis Date  . Adenopathy    left supraclavicular lymph node  . Allergies   . COPD (chronic obstructive pulmonary disease) ([place])   . Coronary disease   . Diabetes ([place])   . Enlarged prostate   . GERD (gastroesophageal reflux disease)   . Glaucoma   . Hyperlipidemia   . Hypertension   . Myocardial infarction ([place])   . Sleep apnea    wears CPAP  . Wears partial dentures    upper and lower    Past Surgical History:  Procedure Laterality Date  . ABDOMINAL AORTAGRAM Left 09/16/2013   Procedure: ABDOMINAL Maxcine Ham;  Surgeon: Elam Dutch, MD;  Location: [facility] CATH LAB;  Service: Cardiovascular;  Laterality: Left;  . CARDIAC CATHETERIZATION   01/2011   When there was segmental stenosis of the distal RCA, patent PCA stent, patent circumflex stent, and a patent small diagonal with 90% ISR.   Marland Kitchen COLONOSCOPY W/ BIOPSIES AND POLYPECTOMY    . CORONARY ANGIOPLASTY  may 2002   Non-DES stenting of his circumflex, non-DES stenting of the RCA  . HYDROCELE EXCISION / REPAIR  2009   by Dr Janice Norrie  . INGUINAL HERNIA REPAIR Bilateral    Dr Bubba Camp  . LYMPH NODE BIOPSY Left 01/10/2019   Procedure: left supraclavicular LYMPH NODE BIOPSY;  Surgeon: [name], MD;  Location: [place];  Service: Thoracic;  Laterality: Left;  Marland Kitchen MULTIPLE TOOTH EXTRACTIONS    . NM MYOCAR PERF WALL MOTION  01/08/2011   moderate in size and intensity area of reversible ischemia in the basal to mid inferior and septal territories. Abnormal study  . stents  2008   proximal RCA, DES for progession of disease.  Marland Kitchen US ECHOCARDIOGRAPHY  01/12/2012   mild concentric LVH, borderline LA enlargement, mild to mod TR    Allergies: Patient has no known allergies.  Medications: Prior to Admission medications   Medication Sig Start Date End Date Taking? Authorizing Provider  amLODipine (NORVASC) 5 MG tablet Take 1 tablet (5 mg total) by mouth daily. 05/04/18 12/31/19  Troy Sine, MD  aspirin EC 81 MG tablet Take  81 mg by mouth daily.    [provider]  atorvastatin (LIPITOR) 80 MG tablet Take 1 tablet (80 mg total) by mouth daily. Patient taking differently: Take 80 mg by mouth every evening.  05/04/18 12/31/19  Troy Sine, MD  augmented betamethasone dipropionate (DIPROLENE-AF) 0.05 % cream  01/12/19   [provider]  cephALEXin (KEFLEX) 500 MG capsule TK 1 C PO Q 8 H FOR 7 DAYS 01/11/19   [provider]  CLARITIN 10 MG tablet Take 10 mg by mouth daily.  08/18/14   [provider]  clopidogrel (PLAVIX) 75 MG tablet Take 1 tablet (75 mg total) by mouth every evening. 01/12/19   Lightfoot, Lucile Crater, MD  finasteride (PROSCAR) 5 MG tablet  Take 5 mg by mouth daily.  02/18/16   [provider]  Fluticasone Furoate-Vilanterol (BREO ELLIPTA) 100-25 MCG/INH AEPB Inhale 1 puff into the lungs daily.    [provider]  hydrochlorothiazide (HYDRODIURIL) 25 MG tablet Take 25 mg by mouth daily.  05/25/13   [provider]  latanoprost (XALATAN) 0.005 % ophthalmic solution Place 1 drop into both eyes at bedtime.  08/09/18   [provider]  metFORMIN (GLUCOPHAGE) 1000 MG tablet Take 1,000 mg by mouth 2 (two) times daily.  05/25/13   [provider]  metoprolol tartrate (LOPRESSOR) 25 MG tablet Take 25 mg 2 (two) times daily by mouth.    [provider]  Multiple Vitamin (MULTIVITAMIN WITH MINERALS) TABS tablet Take 1 tablet by mouth daily. Centrum Silver    [provider]  Multiple Vitamins-Minerals (PRESERVISION AREDS 2 PO) Take 1 tablet by mouth 2 (two) times daily.    [provider]  nitroGLYCERIN (NITROSTAT) 0.4 MG SL tablet Place 1 tablet (0.4 mg total) under the tongue every 5 (five) minutes as needed for chest pain. Patient taking differently: Place 0.4 mg under the tongue every 5 (five) minutes x 3 doses as needed for chest pain.  05/04/18   Troy Sine, MD  pantoprazole (PROTONIX) 40 MG tablet Take 40 mg daily by mouth.    [provider]  Polyethyl Glycol-Propyl Glycol (SYSTANE) 0.4-0.3 % SOLN Place 1-2 drops into both eyes 3 (three) times daily as needed (dry/irritated eyes.).    [provider]  prochlorperazine (COMPAZINE) 10 MG tablet Take 1 tablet (10 mg total) by mouth every 6 (six) hours as needed for nausea or vomiting. 01/13/19   Heilingoetter, Cassandra L, PA-C  ramipril (ALTACE) 10 MG capsule Take 10 mg by mouth 2 (two) times daily.  05/25/13   [provider]  traMADol (ULTRAM) 50 MG tablet Take 1 tablet (50 mg total) by mouth every 6 (six) hours as needed. 01/10/19 01/10/20  [name], MD  VENTOLIN HFA 108 (90 Base)  MCG/ACT inhaler Inhale 1-2 puffs into the lungs every 6 (six) hours as needed for wheezing or shortness of breath.  07/31/15   [provider]  ZETIA 10 MG tablet Take 10 mg by mouth every evening.  05/26/13   [provider]     Family History  Problem Relation Age of Onset  . Heart attack Mother   . Heart disease Mother   . Diabetes Father   . Stroke Father   . Hypertension Father   . Hyperlipidemia Sister   . Hypertension Sister   . Breast cancer Sister     Social History   Socioeconomic History  . Marital status: Married    Spouse name: Not  on file  . Number of children: Not on file  . Years of education: Not on file  . Highest education level: Not on file  Occupational History  . Not on file  Social Needs  . Financial resource strain: Not on file  . Food insecurity    Worry: Not on file    Inability: Not on file  . Transportation needs    Medical: No    Non-medical: No  Tobacco Use  . Smoking status: Former Smoker    Packs/day: 0.10    Years: 50.00    Pack years: 5.00    Types: Cigarettes  . Smokeless tobacco: Never Used  . Tobacco comment: quit smoking cigarettes June 2020  Substance and Sexual Activity  . Alcohol use: Yes    Alcohol/week: 0.0 standard drinks    Comment: moderately  . Drug use: No  . Sexual activity: Not on file  Lifestyle  . Physical activity    Days per week: Not on file    Minutes per session: Not on file  . Stress: Not on file  Relationships  . Social Herbalist on phone: Not on file    Gets together: Not on file    Attends religious service: Not on file    Active member of club or organization: Not on file    Attends meetings of clubs or organizations: Not on file    Relationship status: Not on file  Other Topics Concern  . Not on file  Social History Narrative  . Not on file     Review of Systems: A 12 point ROS discussed and pertinent positives are indicated in the HPI above.  All other systems  are negative.  Review of Systems  Vital Signs: BP 138/82   Pulse 69   Temp (!) 97.2 F (36.2 C) (Skin)   Resp 18   Ht 5\' 9"  (1.753 m)   Wt 86.2 kg   SpO2 99%   BMI 28.06 kg/m   Physical Exam Vitals signs reviewed.  Constitutional:      Appearance: Normal appearance.  HENT:     Head: Normocephalic and atraumatic.  Eyes:     Extraocular Movements: Extraocular movements intact.  Neck:     Musculoskeletal: Normal range of motion.  Cardiovascular:     Rate and Rhythm: Normal rate and regular rhythm.  Pulmonary:     Effort: Pulmonary effort is normal.     Breath sounds: Normal breath sounds.  Abdominal:     General: There is no distension.     Palpations: Abdomen is soft.  Musculoskeletal: Normal range of motion.  Skin:    General: Skin is warm and dry.  Neurological:     General: No focal deficit present.     Mental Status: He is alert and oriented to person, place, and time.  Psychiatric:        Mood and Affect: Mood normal.        Behavior: Behavior normal.        Thought Content: Thought content normal.     Imaging: Dg Chest 2 View  Result Date: 01/10/2019 CLINICAL DATA:  Adenopathy.  Preoperative exam. EXAM: CHEST - 2 VIEW COMPARISON:  CT 11/18/2018.  Chest x-ray 04/18/2014. FINDINGS: Mediastinum hilar structures normal. Heart size normal. No pulmonary venous congestion. Left upper lung lesion noted, best identified by prior CT. Mild left mid lung field subsegmental atelectasis and or scarring. No pleural effusion pneumothorax. No acute bony abnormality. IMPRESSION: 1.  Small left upper lung lesion noted, best identified by prior recent CT. 2. Mild left mid lung field subsegmental atelectasis and or scarring. Electronically Signed   By: Marcello Moores  Register   On: 01/10/2019 06:37   Mr Brain W Wo Contrast  Result Date: 01/21/2019 CLINICAL DATA:  Massive upper lobe of left lung, restaging lung cancer. EXAM: MRI HEAD WITHOUT AND WITH CONTRAST TECHNIQUE: Multiplanar,  multiecho pulse sequences of the brain and surrounding structures were obtained without and with intravenous contrast. CONTRAST:  9 mL Gadavist COMPARISON:  Head CT 08/10/2009 FINDINGS: Brain: The axial acquired T2 weighted sequence is significantly motion degraded. No abnormal intracranial enhancement is demonstrated to suggest intracranial metastatic disease. No evidence of acute infarct. No intracranial mass, midline shift or extra-axial fluid collection. No chronic intracranial hemorrhage. Moderate scattered and confluent T2/FLAIR hyperintensity within the cerebral white matter is nonspecific, although consistent with chronic small vessel ischemic disease. Small chronic lacunar infarct within the right cerebellum. Cerebral volume is normal for age. Vascular: Flow voids maintained within the proximal large arterial vessels. Skull and upper cervical spine: Normal marrow signal. Sinuses/Orbits: Bilateral lens replacements. Within limitations of motion degraded imaging, no significant paranasal sinus disease. Right mastoid effusion. IMPRESSION: No evidence of intracranial metastatic disease. 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 cerebellar lacunar infarct. Right mastoid effusion. Electronically Signed   By: Kellie Simmering   On: 01/21/2019 10:38    Labs:  CBC: Recent Labs    12/23/18 1446 01/06/19 1206 01/13/19 1417  WBC 6.8 6.0 7.5  HGB 13.7 13.0 13.0  HCT 40.4 40.3 39.0  PLT 270 272 265    COAGS: Recent Labs    01/06/19 1206  INR 1.1  APTT 29    BMP: Recent Labs    12/23/18 1446 01/06/19 1206 01/13/19 1417  NA 141 139 141  K 4.8 4.3 4.4  CL 104 103 106  CO2 25 24 25   GLUCOSE 117* 90 154*  BUN 20 20 19   CALCIUM 10.5* 9.8 10.0  CREATININE 1.10 1.02 1.16  GFRNONAA >60 >60 >60  GFRAA >60 >60 >60    LIVER FUNCTION TESTS: Recent Labs    12/23/18 1446 01/06/19 1206 01/13/19 1417  BILITOT 0.2* 0.8 0.3  AST 23 28 17   ALT 35 37 24  ALKPHOS 92 83  92  PROT 7.5 7.1 7.5  ALBUMIN 3.7 3.9 3.5    TUMOR MARKERS: No results for input(s): AFPTM, CEA, CA199, CHROMGRNA in the last 8760 hours.  Assessment and Plan:  Right adrenal nodule worrisome for metastatic cancer.  Will proceed with image guided biopsy today by Dr. Earleen Newport.  Risks and benefits of right adrenal mass biopsy was discussed with the patient and/or patient's family including, but not limited to bleeding, infection, damage to adjacent structures or low yield requiring additional tests.  All of the questions were answered and there is agreement to proceed.  Consent signed and in chart.  Thank you for this interesting consult.  I greatly enjoyed meeting Trevor Bailey and look forward to participating in their care.  A copy of this report was sent to the requesting provider on this date.  Electronically Signed: Murrell Redden, PA-C   01/28/2019, 10:05 AM      I spent a total of  30 Minutes in face to face in clinical consultation, greater than 50% of which was counseling/coordinating care for right adrenal biopsy.

## 2019-01-28 NOTE — Procedures (Signed)
Interventional Radiology Procedure Note  Procedure: CT guided biopsy of right adrenal gland nodule, FDG avid. Mx 18g core.   Complications: None Recommendations:  - Ok to shower tomorrow - Do not submerge for 7 days - Routine care  - 1 hr dc home  Signed,  Dulcy Fanny. Earleen Newport, DO

## 2019-01-28 NOTE — Discharge Instructions (Signed)
Moderate Conscious Sedation, Adult, Care After °These instructions provide you with information about caring for yourself after your procedure. Your health care provider may also give you more specific instructions. Your treatment has been planned according to current medical practices, but problems sometimes occur. Call your health care provider if you have any problems or questions after your procedure. °What can I expect after the procedure? °After your procedure, it is common: °· To feel sleepy for several hours. °· To feel clumsy and have poor balance for several hours. °· To have poor judgment for several hours. °· To vomit if you eat too soon. °Follow these instructions at home: °For at least 24 hours after the procedure: ° °· Do not: °? Participate in activities where you could fall or become injured. °? Drive. °? Use heavy machinery. °? Drink alcohol. °? Take sleeping pills or medicines that cause drowsiness. °? Make important decisions or sign legal documents. °? Take care of children on your own. °· Rest. °Eating and drinking °· Follow the diet recommended by your health care provider. °· If you vomit: °? Drink water, juice, or soup when you can drink without vomiting. °? Make sure you have little or no nausea before eating solid foods. °General instructions °· Have a responsible adult stay with you until you are awake and alert. °· Take over-the-counter and prescription medicines only as told by your health care provider. °· If you smoke, do not smoke without supervision. °· Keep all follow-up visits as told by your health care provider. This is important. °Contact a health care provider if: °· You keep feeling nauseous or you keep vomiting. °· You feel light-headed. °· You develop a rash. °· You have a fever. °Get help right away if: °· You have trouble breathing. °This information is not intended to replace advice given to you by your health care provider. Make sure you discuss any questions you have  with your health care provider. °Document Released: 02/23/2013 Document Revised: 04/17/2017 Document Reviewed: 08/25/2015 °Elsevier Patient Education © 2020 [employer] °Needle Biopsy, Care After °These instructions tell you how to care for yourself after your procedure. Your doctor may also give you more specific instructions. Call your doctor if you have any problems or questions. °What can I expect after the procedure? °After the procedure, it is common to have: °· Soreness. °· Bruising. °· Mild pain. °Follow these instructions at home: ° °· Return to your normal activities as told by your doctor. Ask your doctor what activities are safe for you. °· Take over-the-counter and prescription medicines only as told by your doctor. °· Wash your hands with soap and water before you change your bandage (dressing). If you cannot use soap and water, use hand sanitizer. °· Follow instructions from your doctor about: °? How to take care of your puncture site. °? When and how to change your bandage. °? When to remove your bandage. °· Check your puncture site every day for signs of infection. Watch for: °? Redness, swelling, or pain. °? Fluid or blood.  °? Pus or a bad smell. °? Warmth. °· Do not take baths, swim, or use a hot tub until your doctor approves. Ask your doctor if you may take showers. You may only be allowed to take sponge baths. °· Keep all follow-up visits as told by your doctor. This is important. °Contact a doctor if you have: °· A fever. °· Redness, swelling, or pain at the puncture site, and it lasts longer than a few   days. °· Fluid, blood, or pus coming from the puncture site. °· Warmth coming from the puncture site. °Get help right away if: °· You have a lot of bleeding from the puncture site. °Summary °· After the procedure, it is common to have soreness, bruising, or mild pain at the puncture site. °· Check your puncture site every day for signs of infection, such as redness, swelling, or pain. °· Get  help right away if you have severe bleeding from your puncture site. °This information is not intended to replace advice given to you by your health care provider. Make sure you discuss any questions you have with your health care provider. °Document Released: 04/17/2008 Document Revised: 05/18/2017 Document Reviewed: 05/18/2017 °Elsevier Patient Education © 2020 [employer] Inc[employer] ° °

## 2019-01-28 NOTE — Progress Notes (Signed)
  Radiation Oncology         ([phone]) [phone] ________________________________  Name: Trevor Bailey MRN: [id number]  Date: 01/20/2019  DOB: 01-18-1946  RESPIRATORY MOTION MANAGEMENT SIMULATION  NARRATIVE:  In order to account for effect of respiratory motion on target structures and other organs in the planning and delivery of radiotherapy, this patient underwent respiratory motion management simulation.  To accomplish this, when the patient was brought to the CT simulation planning suite, 4D respiratoy motion management CT images were obtained.  The CT images were loaded into the planning software.  Then, using a variety of tools including Cine, MIP, and standard views, the target volume and planning target volumes (PTV) were delineated.  Avoidance structures were contoured.  Treatment planning then occurred.  Dose volume histograms were generated and reviewed for each of the requested structure.  The resulting plan was carefully reviewed and approved today.   ------------------------------------------------  Jodelle Gross, MD, PhD

## 2019-01-31 ENCOUNTER — Ambulatory Visit: Payer: Medicare Other

## 2019-01-31 ENCOUNTER — Inpatient Hospital Stay (HOSPITAL_BASED_OUTPATIENT_CLINIC_OR_DEPARTMENT_OTHER): Payer: Medicare Other | Admitting: Physician Assistant

## 2019-01-31 ENCOUNTER — Inpatient Hospital Stay: Payer: Medicare Other

## 2019-01-31 ENCOUNTER — Other Ambulatory Visit: Payer: Self-pay

## 2019-01-31 ENCOUNTER — Ambulatory Visit
Admission: RE | Admit: 2019-01-31 | Discharge: 2019-01-31 | Disposition: A | Discharge status: Home / Self Care | Payer: Medicare Other | Source: Ambulatory Visit | Attending: Radiation Oncology | Admitting: Radiation Oncology

## 2019-01-31 VITALS — BP 119/75 | HR 65 | Temp 98.9°F | Resp 18

## 2019-01-31 VITALS — BP 130/82 | HR 70 | Temp 98.5°F | Resp 18 | Ht 69.0 in | Wt 193.3 lb

## 2019-01-31 DIAGNOSIS — Z79899 Other long term (current) drug therapy: Secondary | ICD-10-CM | POA: Diagnosis not present

## 2019-01-31 DIAGNOSIS — C7971 Secondary malignant neoplasm of right adrenal gland: Secondary | ICD-10-CM | POA: Diagnosis not present

## 2019-01-31 DIAGNOSIS — Z5111 Encounter for antineoplastic chemotherapy: Secondary | ICD-10-CM | POA: Diagnosis present

## 2019-01-31 DIAGNOSIS — C3412 Malignant neoplasm of upper lobe, left bronchus or lung: Secondary | ICD-10-CM | POA: Diagnosis not present

## 2019-01-31 DIAGNOSIS — I2510 Atherosclerotic heart disease of native coronary artery without angina pectoris: Secondary | ICD-10-CM | POA: Diagnosis not present

## 2019-01-31 DIAGNOSIS — R918 Other nonspecific abnormal finding of lung field: Secondary | ICD-10-CM

## 2019-01-31 LAB — CMP (CANCER CENTER ONLY)
ALT: 27 U/L (ref 0–44)
AST: 19 U/L (ref 15–41)
Albumin: 3.6 g/dL (ref 3.5–5.0)
Alkaline Phosphatase: 94 U/L (ref 38–126)
Anion gap: 9 (ref 5–15)
BUN: 12 mg/dL (ref 8–23)
CO2: 26 mmol/L (ref 22–32)
Calcium: 9.4 mg/dL (ref 8.9–10.3)
Chloride: 105 mmol/L (ref 98–111)
Creatinine: 0.83 mg/dL (ref 0.61–1.24)
GFR, Est AFR Am: >60 mL/min (ref >60)
GFR, Estimated: >60 mL/min (ref >60)
Glucose, Bld: 137 mg/dL — ABNORMAL HIGH (ref 70–99)
Potassium: 3.7 mmol/L (ref 3.5–5.1)
Sodium: 140 mmol/L (ref 135–145)
Total Bilirubin: 0.4 mg/dL (ref 0.3–1.2)
Total Protein: 7.1 g/dL (ref 6.5–8.1)

## 2019-01-31 LAB — CBC WITH DIFFERENTIAL (CANCER CENTER ONLY)
Abs Immature Granulocytes: 0.01 10*3/uL (ref 0.00–0.07)
Basophils Absolute: 0 10*3/uL (ref 0.0–0.1)
Basophils Relative: 1 %
Eosinophils Absolute: 0.2 10*3/uL (ref 0.0–0.5)
Eosinophils Relative: 3 %
HCT: 37.3 % — ABNORMAL LOW (ref 39.0–52.0)
Hemoglobin: 12.4 g/dL — ABNORMAL LOW (ref 13.0–17.0)
Immature Granulocytes: 0 %
Lymphocytes Relative: 21 %
Lymphs Abs: 1.2 10*3/uL (ref 0.7–4.0)
MCH: 31.8 pg (ref 26.0–34.0)
MCHC: 33.2 g/dL (ref 30.0–36.0)
MCV: 95.6 fL (ref 80.0–100.0)
Monocytes Absolute: 0.7 10*3/uL (ref 0.1–1.0)
Monocytes Relative: 11 %
Neutro Abs: 3.7 10*3/uL (ref 1.7–7.7)
Neutrophils Relative %: 64 %
Platelet Count: 266 10*3/uL (ref 150–400)
RBC: 3.9 MIL/uL — ABNORMAL LOW (ref 4.22–5.81)
RDW: 12.1 % (ref 11.5–15.5)
WBC Count: 5.9 10*3/uL (ref 4.0–10.5)
nRBC: 0 % (ref 0.0–0.2)

## 2019-01-31 MED ORDER — SODIUM CHLORIDE 0.9 % IV SOLN
45.0000 mg/m2 | Freq: Once | INTRAVENOUS | Status: AC
  Administered 2019-01-31: 11:00:00 90 mg via INTRAVENOUS
  Filled 2019-01-31: qty 15

## 2019-01-31 MED ORDER — SODIUM CHLORIDE 0.9 % IV SOLN
188.2000 mg | Freq: Once | INTRAVENOUS | Status: AC
  Administered 2019-01-31: 190 mg via INTRAVENOUS
  Filled 2019-01-31: qty 19

## 2019-01-31 MED ORDER — SODIUM CHLORIDE 0.9% FLUSH
10.0000 mL | INTRAVENOUS | Status: DC | PRN
  Filled 2019-01-31: qty 10

## 2019-01-31 MED ORDER — PALONOSETRON HCL INJECTION 0.25 MG/5ML
0.2500 mg | Freq: Once | INTRAVENOUS | Status: AC
  Administered 2019-01-31: 0.25 mg via INTRAVENOUS

## 2019-01-31 MED ORDER — HEPARIN SOD (PORK) LOCK FLUSH 100 UNIT/ML IV SOLN
500.0000 [IU] | Freq: Once | INTRAVENOUS | Status: DC | PRN
  Filled 2019-01-31: qty 5

## 2019-01-31 MED ORDER — DIPHENHYDRAMINE HCL 50 MG/ML IJ SOLN
INTRAMUSCULAR | Status: AC
  Filled 2019-01-31: qty 1

## 2019-01-31 MED ORDER — SODIUM CHLORIDE 0.9 % IV SOLN
Freq: Once | INTRAVENOUS | Status: AC
  Administered 2019-01-31: 10:00:00 via INTRAVENOUS
  Filled 2019-01-31: qty 250

## 2019-01-31 MED ORDER — FAMOTIDINE IN NACL 20-0.9 MG/50ML-% IV SOLN
INTRAVENOUS | Status: AC
  Filled 2019-01-31: qty 50

## 2019-01-31 MED ORDER — FAMOTIDINE IN NACL 20-0.9 MG/50ML-% IV SOLN
20.0000 mg | Freq: Once | INTRAVENOUS | Status: AC
  Administered 2019-01-31: 20 mg via INTRAVENOUS

## 2019-01-31 MED ORDER — PALONOSETRON HCL INJECTION 0.25 MG/5ML
INTRAVENOUS | Status: AC
  Filled 2019-01-31: qty 5

## 2019-01-31 MED ORDER — SODIUM CHLORIDE 0.9 % IV SOLN
20.0000 mg | Freq: Once | INTRAVENOUS | Status: AC
  Administered 2019-01-31: 20 mg via INTRAVENOUS
  Filled 2019-01-31: qty 20

## 2019-01-31 MED ORDER — DIPHENHYDRAMINE HCL 50 MG/ML IJ SOLN
50.0000 mg | Freq: Once | INTRAMUSCULAR | Status: AC
  Administered 2019-01-31: 50 mg via INTRAVENOUS

## 2019-01-31 NOTE — Patient Instructions (Signed)
Non-small cell lung cancer, Adenocarcinoma

## 2019-01-31 NOTE — Progress Notes (Signed)
[place] OFFICE PROGRESS NOTE  [name], MD [address]  [place] [address]  DIAGNOSIS: Suspicious metastatic non-small cell lung cancer, adenocarcinoma. 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  PRIOR THERAPY: None  CURRENT THERAPY: Weekly concurrent chemoradiation with carboplatin for an AUC of 2 and paclitaxel 45 mg/m2. He is expected to receive his first dose 01/31/2019  INTERVAL HISTORY: Trevor Bailey 73 y.o. male returns to the clinic for a follow-up visit.  The patient is doing well today without any concerning complaints.  He denies any fever, night sweats, chills, or weight loss.  He denies any chest pain, shortness of breath, cough, or hemoptysis.  He denies any nausea, vomiting, diarrhea, or constipation.  He denies any headache or visual changes.  The patient recently had a CT-guided biopsy of his right adrenal gland on 01/28/2019.  Pathology still pending.  He tolerated this procedure well without any adverse effects.  The patient is also to start first treatment with radiation later this afternoon.  His last day radiation is scheduled for 03/16/2019. The patient is here today for evaluation before starting his first cycle of weekly carboplatin and paclitaxel.   MEDICAL HISTORY: Past Medical History:  Diagnosis Date  . Adenopathy    left supraclavicular lymph node  . Allergies   . COPD (chronic obstructive pulmonary disease) ([place])   . Coronary disease   . Diabetes ([place])   . Enlarged prostate   . GERD (gastroesophageal reflux disease)   . Glaucoma   . Hyperlipidemia   . Hypertension   . Myocardial infarction ([place])   . Sleep apnea    wears CPAP  . Wears partial dentures    upper and lower    ALLERGIES:  has No Known Allergies.  MEDICATIONS:  Current  Outpatient Medications  Medication Sig Dispense Refill  . amLODipine (NORVASC) 5 MG tablet Take 1 tablet (5 mg total) by mouth daily. 90 tablet 3  . aspirin EC 81 MG tablet Take 81 mg by mouth daily.    Marland Kitchen atorvastatin (LIPITOR) 80 MG tablet Take 1 tablet (80 mg total) by mouth daily. (Patient taking differently: Take 80 mg by mouth every evening. ) 90 tablet 3  . augmented betamethasone dipropionate (DIPROLENE-AF) 0.05 % cream     . cephALEXin (KEFLEX) 500 MG capsule TK 1 C PO Q 8 H FOR 7 DAYS    . CLARITIN 10 MG tablet Take 10 mg by mouth daily.     . clopidogrel (PLAVIX) 75 MG tablet Take 1 tablet (75 mg total) by mouth every evening. 30 tablet 0  . finasteride (PROSCAR) 5 MG tablet Take 5 mg by mouth daily.     . Fluticasone Furoate-Vilanterol (BREO ELLIPTA) 100-25 MCG/INH AEPB Inhale 1 puff into the lungs daily.    . hydrochlorothiazide (HYDRODIURIL) 25 MG tablet Take 25 mg by mouth daily.     Marland Kitchen latanoprost (XALATAN) 0.005 % ophthalmic solution Place 1 drop into both eyes at bedtime.     . metFORMIN (GLUCOPHAGE) 1000 MG tablet Take 1,000 mg by mouth 2 (two) times daily.     . metoprolol tartrate (LOPRESSOR) 25 MG tablet Take 25 mg 2 (two) times daily by mouth.    . Multiple Vitamin (MULTIVITAMIN WITH MINERALS) TABS tablet Take 1 tablet by mouth daily. Centrum Silver    .  Multiple Vitamins-Minerals (PRESERVISION AREDS 2 PO) Take 1 tablet by mouth 2 (two) times daily.    . nitroGLYCERIN (NITROSTAT) 0.4 MG SL tablet Place 1 tablet (0.4 mg total) under the tongue every 5 (five) minutes as needed for chest pain. (Patient taking differently: Place 0.4 mg under the tongue every 5 (five) minutes x 3 doses as needed for chest pain. ) 25 tablet 6  . pantoprazole (PROTONIX) 40 MG tablet Take 40 mg daily by mouth.    Vladimir Faster Glycol-Propyl Glycol (SYSTANE) 0.4-0.3 % SOLN Place 1-2 drops into both eyes 3 (three) times daily as needed (dry/irritated eyes.).    Marland Kitchen prochlorperazine (COMPAZINE) 10 MG tablet  Take 1 tablet (10 mg total) by mouth every 6 (six) hours as needed for nausea or vomiting. 30 tablet 2  . ramipril (ALTACE) 10 MG capsule Take 10 mg by mouth 2 (two) times daily.     . traMADol (ULTRAM) 50 MG tablet Take 1 tablet (50 mg total) by mouth every 6 (six) hours as needed. 20 tablet 0  . VENTOLIN HFA 108 (90 Base) MCG/ACT inhaler Inhale 1-2 puffs into the lungs every 6 (six) hours as needed for wheezing or shortness of breath.   0  . ZETIA 10 MG tablet Take 10 mg by mouth every evening.      No current facility-administered medications for this visit.     SURGICAL HISTORY:  Past Surgical History:  Procedure Laterality Date  . ABDOMINAL AORTAGRAM Left 09/16/2013   Procedure: ABDOMINAL Maxcine Ham;  Surgeon: Elam Dutch, MD;  Location: [facility] CATH LAB;  Service: Cardiovascular;  Laterality: Left;  . CARDIAC CATHETERIZATION  01/2011   When there was segmental stenosis of the distal RCA, patent PCA stent, patent circumflex stent, and a patent small diagonal with 90% ISR.   Marland Kitchen COLONOSCOPY W/ BIOPSIES AND POLYPECTOMY    . CORONARY ANGIOPLASTY  may 2002   Non-DES stenting of his circumflex, non-DES stenting of the RCA  . HYDROCELE EXCISION / REPAIR  2009   by Dr Janice Norrie  . INGUINAL HERNIA REPAIR Bilateral    Dr Bubba Camp  . LYMPH NODE BIOPSY Left 01/10/2019   Procedure: left supraclavicular LYMPH NODE BIOPSY;  Surgeon: [name], MD;  Location: [facility];  Service: Thoracic;  Laterality: Left;  Marland Kitchen MULTIPLE TOOTH EXTRACTIONS    . NM MYOCAR PERF WALL MOTION  01/08/2011   moderate in size and intensity area of reversible ischemia in the basal to mid inferior and septal territories. Abnormal study  . stents  2008   proximal RCA, DES for progession of disease.  Marland Kitchen US ECHOCARDIOGRAPHY  01/12/2012   mild concentric LVH, borderline LA enlargement, mild to mod TR    REVIEW OF SYSTEMS:   Review of Systems  Constitutional: Negative for appetite change, chills, fatigue, fever and unexpected weight  change.  HENT:   Negative for mouth sores, nosebleeds, sore throat and trouble swallowing.   Eyes: Negative for eye problems and icterus.  Respiratory: Negative for cough, hemoptysis, shortness of breath and wheezing.   Cardiovascular: Negative for chest pain and leg swelling.  Gastrointestinal: Negative for abdominal pain, constipation, diarrhea, nausea and vomiting.  Genitourinary: Negative for bladder incontinence, difficulty urinating, dysuria, frequency and hematuria.   Musculoskeletal: Negative for back pain, gait problem, neck pain and neck stiffness.  Skin: Negative for itching and rash.  Neurological: Negative for dizziness, extremity weakness, gait problem, headaches, light-headedness and seizures.  Hematological: Negative for adenopathy. Does not bruise/bleed easily.  Psychiatric/Behavioral: Negative  for confusion, depression and sleep disturbance. The patient is not nervous/anxious.     PHYSICAL EXAMINATION:  Blood pressure 130/82, pulse 70, temperature 98.5 F (36.9 C), temperature source Temporal, resp. rate 18, height 5\' 9"  (1.753 m), weight 193 lb 4.8 oz (87.7 kg), SpO2 100 %.  ECOG PERFORMANCE STATUS: 0 - Asymptomatic  Physical Exam  Constitutional: Oriented to person, place, and time and well-developed, well-nourished, and in no distress.  HENT:  Head: Normocephalic and atraumatic.  Mouth/Throat: Oropharynx is clear and moist. No oropharyngeal exudate.  Eyes: Conjunctivae are normal. Right eye exhibits no discharge. Left eye exhibits no discharge. No scleral icterus.  Neck: Normal range of motion. Neck supple.  Cardiovascular: Normal rate, regular rhythm, normal heart sounds and intact distal pulses.   Pulmonary/Chest: Effort normal and breath sounds normal. No respiratory distress. No wheezes. No rales.  Abdominal: Soft. Bowel sounds are normal. Exhibits no distension and no mass. There is no tenderness.  Musculoskeletal: Normal range of motion. Exhibits no edema.   Lymphadenopathy:    No cervical adenopathy.  Neurological: Alert and oriented to person, place, and time. Exhibits normal muscle tone. Gait normal. Coordination normal.  Skin: Skin is warm and dry. No rash noted. Not diaphoretic. No erythema. No pallor.  Psychiatric: Mood, memory and judgment normal.  Vitals reviewed.  LABORATORY DATA: Lab Results  Component Value Date   WBC 5.9 01/31/2019   HGB 12.4 (L) 01/31/2019   HCT 37.3 (L) 01/31/2019   MCV 95.6 01/31/2019   PLT 266 01/31/2019      Chemistry      Component Value Date/Time   NA 140 01/31/2019 0832   K 3.7 01/31/2019 0832   CL 105 01/31/2019 0832   CO2 26 01/31/2019 0832   BUN 12 01/31/2019 0832   CREATININE 0.83 01/31/2019 0832   CREATININE 0.94 05/16/2014 1109      Component Value Date/Time   CALCIUM 9.4 01/31/2019 0832   ALKPHOS 94 01/31/2019 0832   AST 19 01/31/2019 0832   ALT 27 01/31/2019 0832   BILITOT 0.4 01/31/2019 0832       RADIOGRAPHIC STUDIES:  Dg Chest 2 View  Result Date: 01/10/2019 CLINICAL DATA:  Adenopathy.  Preoperative exam. EXAM: CHEST - 2 VIEW COMPARISON:  CT 11/18/2018.  Chest x-ray 04/18/2014. FINDINGS: Mediastinum hilar structures normal. Heart size normal. No pulmonary venous congestion. Left upper lung lesion noted, best identified by prior CT. Mild left mid lung field subsegmental atelectasis and or scarring. No pleural effusion pneumothorax. No acute bony abnormality. IMPRESSION: 1. Small left upper lung lesion noted, best identified by prior recent CT. 2. Mild left mid lung field subsegmental atelectasis and or scarring. Electronically Signed   By: Marcello Moores  Register   On: 01/10/2019 06:37   Mr Brain W Wo Contrast  Result Date: 01/21/2019 CLINICAL DATA:  Massive upper lobe of left lung, restaging lung cancer. EXAM: MRI HEAD WITHOUT AND WITH CONTRAST TECHNIQUE: Multiplanar, multiecho pulse sequences of the brain and surrounding structures were obtained without and with intravenous  contrast. CONTRAST:  9 mL Gadavist COMPARISON:  Head CT 08/10/2009 FINDINGS: Brain: The axial acquired T2 weighted sequence is significantly motion degraded. No abnormal intracranial enhancement is demonstrated to suggest intracranial metastatic disease. No evidence of acute infarct. No intracranial mass, midline shift or extra-axial fluid collection. No chronic intracranial hemorrhage. Moderate scattered and confluent T2/FLAIR hyperintensity within the cerebral white matter is nonspecific, although consistent with chronic small vessel ischemic disease. Small chronic lacunar infarct within the right cerebellum. Cerebral  volume is normal for age. Vascular: Flow voids maintained within the proximal large arterial vessels. Skull and upper cervical spine: Normal marrow signal. Sinuses/Orbits: Bilateral lens replacements. Within limitations of motion degraded imaging, no significant paranasal sinus disease. Right mastoid effusion. IMPRESSION: No evidence of intracranial metastatic disease. 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 cerebellar lacunar infarct. Right mastoid effusion. Electronically Signed   By: Kellie Simmering   On: 01/21/2019 10:38   Ct Biopsy  Result Date: 01/28/2019 INDICATION: 73 year old male with a history likely metastatic disease, FDG avid lesion the right adrenal gland EXAM: CT BIOPSY MEDICATIONS: None. ANESTHESIA/SEDATION: Moderate (conscious) sedation was employed during this procedure. A total of Versed 2.0 mg and Fentanyl 100 mcg was administered intravenously. Moderate Sedation Time: 21 minutes. The patient's level of consciousness and vital signs were monitored continuously by radiology nursing throughout the procedure under my direct supervision. FLUOROSCOPY TIME:  CT COMPLICATIONS: None PROCEDURE: Informed written consent was obtained from the patient after a thorough discussion of the procedural risks, benefits and alternatives. All questions were addressed.  Maximal Sterile Barrier Technique was utilized including caps, mask, sterile gowns, sterile gloves, sterile drape, hand hygiene and skin antiseptic. A timeout was performed prior to the initiation of the procedure. Patient position in the right cubitus position on the CT gantry with scout CT images acquired. The patient is prepped and draped in the usual sterile fashion. 1% lidocaine was used for local anesthesia. Using CT guidance, 17 gauge introducer needle was advanced through a posterior approach, between the twelfth and eleventh ribs, ultimately with a trans renal pathway for the right adrenal gland nodule. Once we confirmed needle tip position, multiple 18 gauge core biopsy were acquired. Needle was removed and a final image was stored. Patient tolerated the procedure well and remained hemodynamically stable throughout. No complications were encountered and no significant blood loss. IMPRESSION: Status post CT-guided biopsy of right adrenal gland nodule. Tissue specimen sent to pathology for complete histopathologic analysis. Signed, Dulcy Fanny. Dellia Nims, RPVI [facility] Electronically Signed   By: Corrie Mckusick D.O.   On: 01/28/2019 14:39     ASSESSMENT/PLAN:  This is a very pleasant 73 year old African-American male with suspicious metastatic non-small cell lung cancer, adenocarcinoma.  He presented with left upper lobe lung mass in addition to left supraclavicular lymphadenopathy. He also has a suspicious lesion on the right adrenal gland and a muscular lesion along the right inferior ramus. His biopsy of the adrenal gland is still pending. He was diagnosed in August 2020.  The patient is currently undergoing weekly concurrent chemoradiation with carboplatin for an AUC of 2 and paclitaxel 45 mg/m2. The patient is expected to start his first treatment today.  Labs were reviewed. We recommend that he proceed with his first cycle of  chemotherapy today as scheduled.  The patient will come for a follow-up visit in 1 week for evaluation and to review his pathology results from his adrenal biopsy before starting cycle #2.  The patient was advised to call immediately if he has any concerning symptoms in the interval. The patient voices understanding of current disease status and treatment options and is in agreement with the current care plan. All questions were answered. The patient knows to call the clinic with any problems, questions or concerns. We can certainly see the patient much sooner if necessary No orders of the defined types were placed in this encounter.    Cassandra L Heilingoetter, PA-C 01/31/19

## 2019-01-31 NOTE — Patient Instructions (Signed)
[facility]cer Center Discharge Instructions for Patients Receiving Chemotherapy  Today you received the following chemotherapy agents: Taxol and Carboplatin.  To help prevent nausea and vomiting after your treatment, we encourage you to take your nausea medication as directed.   If you develop nausea and vomiting that is not controlled by your nausea medication, call the clinic.   BELOW ARE SYMPTOMS THAT SHOULD BE REPORTED IMMEDIATELY:  *FEVER GREATER THAN 100.5 F  *CHILLS WITH OR WITHOUT FEVER  NAUSEA AND VOMITING THAT IS NOT CONTROLLED WITH YOUR NAUSEA MEDICATION  *UNUSUAL SHORTNESS OF BREATH  *UNUSUAL BRUISING OR BLEEDING  TENDERNESS IN MOUTH AND THROAT WITH OR WITHOUT PRESENCE OF ULCERS  *URINARY PROBLEMS  *BOWEL PROBLEMS  UNUSUAL RASH Items with * indicate a potential emergency and should be followed up as soon as possible.  Feel free to call the clinic should you have any questions or concerns. The clinic phone number is [phone].  Please show the CHEMO ALERT CARD at check-in to the Emergency Department and triage nurse.  Paclitaxel injection What is this medicine? PACLITAXEL (PAK li TAX el) is a chemotherapy drug. It targets fast dividing cells, like cancer cells, and causes these cells to die. This medicine is used to treat ovarian cancer, breast cancer, lung cancer, Kaposi's sarcoma, and other cancers. This medicine may be used for other purposes; ask your health care provider or pharmacist if you have questions. COMMON BRAND NAME(S): Onxol, Taxol What should I tell my health care provider before I take this medicine? They need to know if you have any of these conditions:  history of irregular heartbeat  liver disease  low blood counts, like low white cell, platelet, or red cell counts  lung or breathing disease, like asthma  tingling of the fingers or toes, or other nerve disorder  an unusual or allergic reaction to paclitaxel, alcohol,  polyoxyethylated castor oil, other chemotherapy, other medicines, foods, dyes, or preservatives  pregnant or trying to get pregnant  breast-feeding How should I use this medicine? This drug is given as an infusion into a vein. It is administered in a hospital or clinic by a specially trained health care professional. Talk to your pediatrician regarding the use of this medicine in children. Special care may be needed. Overdosage: If you think you have taken too much of this medicine contact a poison control center or emergency room at once. NOTE: This medicine is only for you. Do not share this medicine with others. What if I miss a dose? It is important not to miss your dose. Call your doctor or health care professional if you are unable to keep an appointment. What may interact with this medicine? Do not take this medicine with any of the following medications:  disulfiram  metronidazole This medicine may also interact with the following medications:  antiviral medicines for hepatitis, HIV or AIDS  certain antibiotics like erythromycin and clarithromycin  certain medicines for fungal infections like ketoconazole and itraconazole  certain medicines for seizures like carbamazepine, phenobarbital, phenytoin  gemfibrozil  nefazodone  rifampin  St. John's wort This list may not describe all possible interactions. Give your health care provider a list of all the medicines, herbs, non-prescription drugs, or dietary supplements you use. Also tell them if you smoke, drink alcohol, or use illegal drugs. Some items may interact with your medicine. What should I watch for while using this medicine? Your condition will be monitored carefully while you are receiving this medicine. You will need important   blood work done while you are taking this medicine. This medicine can cause serious allergic reactions. To reduce your risk you will need to take other medicine(s) before treatment with this  medicine. If you experience allergic reactions like skin rash, itching or hives, swelling of the face, lips, or tongue, tell your doctor or health care professional right away. In some cases, you may be given additional medicines to help with side effects. Follow all directions for their use. This drug may make you feel generally unwell. This is not uncommon, as chemotherapy can affect healthy cells as well as cancer cells. Report any side effects. Continue your course of treatment even though you feel ill unless your doctor tells you to stop. Call your doctor or health care professional for advice if you get a fever, chills or sore throat, or other symptoms of a cold or flu. Do not treat yourself. This drug decreases your body's ability to fight infections. Try to avoid being around people who are sick. This medicine may increase your risk to bruise or bleed. Call your doctor or health care professional if you notice any unusual bleeding. Be careful brushing and flossing your teeth or using a toothpick because you may get an infection or bleed more easily. If you have any dental work done, tell your dentist you are receiving this medicine. Avoid taking products that contain aspirin, acetaminophen, ibuprofen, naproxen, or ketoprofen unless instructed by your doctor. These medicines may hide a fever. Do not become pregnant while taking this medicine. Women should inform their doctor if they wish to become pregnant or think they might be pregnant. There is a potential for serious side effects to an unborn child. Talk to your health care professional or pharmacist for more information. Do not breast-feed an infant while taking this medicine. Men are advised not to father a child while receiving this medicine. This product may contain alcohol. Ask your pharmacist or healthcare provider if this medicine contains alcohol. Be sure to tell all healthcare providers you are taking this medicine. Certain medicines,  like metronidazole and disulfiram, can cause an unpleasant reaction when taken with alcohol. The reaction includes flushing, headache, nausea, vomiting, sweating, and increased thirst. The reaction can last from 30 minutes to several hours. What side effects may I notice from receiving this medicine? Side effects that you should report to your doctor or health care professional as soon as possible:  allergic reactions like skin rash, itching or hives, swelling of the face, lips, or tongue  breathing problems  changes in vision  fast, irregular heartbeat  high or low blood pressure  mouth sores  pain, tingling, numbness in the hands or feet  signs of decreased platelets or bleeding - bruising, pinpoint red spots on the skin, black, tarry stools, blood in the urine  signs of decreased red blood cells - unusually weak or tired, feeling faint or lightheaded, falls  signs of infection - fever or chills, cough, sore throat, pain or difficulty passing urine  signs and symptoms of liver injury like dark yellow or brown urine; general ill feeling or flu-like symptoms; light-colored stools; loss of appetite; nausea; right upper belly pain; unusually weak or tired; yellowing of the eyes or skin  swelling of the ankles, feet, hands  unusually slow heartbeat Side effects that usually do not require medical attention (report to your doctor or health care professional if they continue or are bothersome):  diarrhea  hair loss  loss of appetite  muscle or joint   pain  nausea, vomiting  pain, redness, or irritation at site where injected  tiredness This list may not describe all possible side effects. Call your doctor for medical advice about side effects. You may report side effects to FDA at 1-800-FDA-1088. Where should I keep my medicine? This drug is given in a hospital or clinic and will not be stored at home. NOTE: This sheet is a summary. It may not cover all possible information.  If you have questions about this medicine, talk to your doctor, pharmacist, or health care provider.  2020 [employer] Standard (2017-01-06 13:14:55) Carboplatin injection What is this medicine? CARBOPLATIN (KAR boe pla tin) is a chemotherapy drug. It targets fast dividing cells, like cancer cells, and causes these cells to die. This medicine is used to treat ovarian cancer and many other cancers. This medicine may be used for other purposes; ask your health care provider or pharmacist if you have questions. COMMON BRAND NAME(S): Paraplatin What should I tell my health care provider before I take this medicine? They need to know if you have any of these conditions:  blood disorders  hearing problems  kidney disease  recent or ongoing radiation therapy  an unusual or allergic reaction to carboplatin, cisplatin, other chemotherapy, other medicines, foods, dyes, or preservatives  pregnant or trying to get pregnant  breast-feeding How should I use this medicine? This drug is usually given as an infusion into a vein. It is administered in a hospital or clinic by a specially trained health care professional. Talk to your pediatrician regarding the use of this medicine in children. Special care may be needed. Overdosage: If you think you have taken too much of this medicine contact a poison control center or emergency room at once. NOTE: This medicine is only for you. Do not share this medicine with others. What if I miss a dose? It is important not to miss a dose. Call your doctor or health care professional if you are unable to keep an appointment. What may interact with this medicine?  medicines for seizures  medicines to increase blood counts like filgrastim, pegfilgrastim, sargramostim  some antibiotics like amikacin, gentamicin, neomycin, streptomycin, tobramycin  vaccines Talk to your doctor or health care professional before taking any of these  medicines:  acetaminophen  aspirin  ibuprofen  ketoprofen  naproxen This list may not describe all possible interactions. Give your health care provider a list of all the medicines, herbs, non-prescription drugs, or dietary supplements you use. Also tell them if you smoke, drink alcohol, or use illegal drugs. Some items may interact with your medicine. What should I watch for while using this medicine? Your condition will be monitored carefully while you are receiving this medicine. You will need important blood work done while you are taking this medicine. This drug may make you feel generally unwell. This is not uncommon, as chemotherapy can affect healthy cells as well as cancer cells. Report any side effects. Continue your course of treatment even though you feel ill unless your doctor tells you to stop. In some cases, you may be given additional medicines to help with side effects. Follow all directions for their use. Call your doctor or health care professional for advice if you get a fever, chills or sore throat, or other symptoms of a cold or flu. Do not treat yourself. This drug decreases your body's ability to fight infections. Try to avoid being around people who are sick. This medicine may increase your risk to bruise or   bleed. Call your doctor or health care professional if you notice any unusual bleeding. Be careful brushing and flossing your teeth or using a toothpick because you may get an infection or bleed more easily. If you have any dental work done, tell your dentist you are receiving this medicine. Avoid taking products that contain aspirin, acetaminophen, ibuprofen, naproxen, or ketoprofen unless instructed by your doctor. These medicines may hide a fever. Do not become pregnant while taking this medicine. Women should inform their doctor if they wish to become pregnant or think they might be pregnant. There is a potential for serious side effects to an unborn child. Talk  to your health care professional or pharmacist for more information. Do not breast-feed an infant while taking this medicine. What side effects may I notice from receiving this medicine? Side effects that you should report to your doctor or health care professional as soon as possible:  allergic reactions like skin rash, itching or hives, swelling of the face, lips, or tongue  signs of infection - fever or chills, cough, sore throat, pain or difficulty passing urine  signs of decreased platelets or bleeding - bruising, pinpoint red spots on the skin, black, tarry stools, nosebleeds  signs of decreased red blood cells - unusually weak or tired, fainting spells, lightheadedness  breathing problems  changes in hearing  changes in vision  chest pain  high blood pressure  low blood counts - This drug may decrease the number of white blood cells, red blood cells and platelets. You may be at increased risk for infections and bleeding.  nausea and vomiting  pain, swelling, redness or irritation at the injection site  pain, tingling, numbness in the hands or feet  problems with balance, talking, walking  trouble passing urine or change in the amount of urine Side effects that usually do not require medical attention (report to your doctor or health care professional if they continue or are bothersome):  hair loss  loss of appetite  metallic taste in the mouth or changes in taste This list may not describe all possible side effects. Call your doctor for medical advice about side effects. You may report side effects to FDA at 1-800-FDA-1088. Where should I keep my medicine? This drug is given in a hospital or clinic and will not be stored at home. NOTE: This sheet is a summary. It may not cover all possible information. If you have questions about this medicine, talk to your doctor, pharmacist, or health care provider.  2020 [employer]/Gold Standard (2007-08-10 14:38:05)  

## 2019-02-01 ENCOUNTER — Ambulatory Visit
Admission: RE | Admit: 2019-02-01 | Discharge: 2019-02-01 | Disposition: A | Discharge status: Home / Self Care | Payer: Medicare Other | Source: Ambulatory Visit | Attending: Radiation Oncology | Admitting: Radiation Oncology

## 2019-02-01 ENCOUNTER — Ambulatory Visit: Payer: Medicare Other

## 2019-02-01 ENCOUNTER — Other Ambulatory Visit: Payer: Self-pay

## 2019-02-01 ENCOUNTER — Telehealth: Payer: Self-pay | Admitting: *Deleted

## 2019-02-01 DIAGNOSIS — C3412 Malignant neoplasm of upper lobe, left bronchus or lung: Secondary | ICD-10-CM | POA: Diagnosis not present

## 2019-02-02 ENCOUNTER — Ambulatory Visit
Admission: RE | Admit: 2019-02-02 | Discharge: 2019-02-02 | Disposition: A | Discharge status: Home / Self Care | Payer: Medicare Other | Source: Ambulatory Visit | Attending: Radiation Oncology | Admitting: Radiation Oncology

## 2019-02-02 ENCOUNTER — Other Ambulatory Visit: Payer: Self-pay

## 2019-02-02 DIAGNOSIS — C3412 Malignant neoplasm of upper lobe, left bronchus or lung: Secondary | ICD-10-CM | POA: Diagnosis not present

## 2019-02-03 ENCOUNTER — Other Ambulatory Visit: Payer: Self-pay

## 2019-02-03 ENCOUNTER — Ambulatory Visit
Admission: RE | Admit: 2019-02-03 | Discharge: 2019-02-03 | Disposition: A | Discharge status: Home / Self Care | Payer: Medicare Other | Source: Ambulatory Visit | Attending: Radiation Oncology | Admitting: Radiation Oncology

## 2019-02-03 DIAGNOSIS — C3412 Malignant neoplasm of upper lobe, left bronchus or lung: Secondary | ICD-10-CM | POA: Diagnosis not present

## 2019-02-04 ENCOUNTER — Ambulatory Visit: Payer: Medicare Other

## 2019-02-04 ENCOUNTER — Ambulatory Visit
Admission: RE | Admit: 2019-02-04 | Discharge: 2019-02-04 | Disposition: A | Discharge status: Home / Self Care | Payer: Medicare Other | Source: Ambulatory Visit | Attending: Radiation Oncology | Admitting: Radiation Oncology

## 2019-02-04 ENCOUNTER — Other Ambulatory Visit: Payer: Self-pay

## 2019-02-04 DIAGNOSIS — C3412 Malignant neoplasm of upper lobe, left bronchus or lung: Secondary | ICD-10-CM | POA: Diagnosis not present

## 2019-02-04 MED ORDER — SONAFINE EX EMUL
1.0000 "application " | Freq: Once | CUTANEOUS | Status: AC
  Administered 2019-02-04: 1 via TOPICAL

## 2019-02-04 NOTE — Progress Notes (Signed)
Pt here for patient teaching.  Pt given Radiation and You booklet and Sonafine.  Reviewed areas of pertinence such as fatigue, hair loss, skin changes, throat changes, cough and shortness of breath . Pt able to give teach back of to pat skin and use unscented/gentle soap,apply Sonafine bid and avoid applying anything to skin within 4 hours of treatment. Pt verbalizes understanding of information given and will contact nursing with any questions or concerns.    Gloriajean Dell. Leonie Green, BSN

## 2019-02-07 ENCOUNTER — Inpatient Hospital Stay: Payer: Medicare Other

## 2019-02-07 ENCOUNTER — Inpatient Hospital Stay (HOSPITAL_BASED_OUTPATIENT_CLINIC_OR_DEPARTMENT_OTHER): Payer: Medicare Other | Admitting: Internal Medicine

## 2019-02-07 ENCOUNTER — Encounter: Payer: Self-pay | Admitting: Internal Medicine

## 2019-02-07 ENCOUNTER — Ambulatory Visit
Admission: RE | Admit: 2019-02-07 | Discharge: 2019-02-07 | Disposition: A | Discharge status: Home / Self Care | Payer: Medicare Other | Source: Ambulatory Visit | Attending: Radiation Oncology | Admitting: Radiation Oncology

## 2019-02-07 ENCOUNTER — Other Ambulatory Visit: Payer: Self-pay

## 2019-02-07 VITALS — BP 140/78 | HR 67 | Temp 97.9°F | Resp 18 | Ht 69.0 in | Wt 191.8 lb

## 2019-02-07 DIAGNOSIS — C3412 Malignant neoplasm of upper lobe, left bronchus or lung: Secondary | ICD-10-CM | POA: Diagnosis not present

## 2019-02-07 DIAGNOSIS — R918 Other nonspecific abnormal finding of lung field: Secondary | ICD-10-CM

## 2019-02-07 DIAGNOSIS — Z5111 Encounter for antineoplastic chemotherapy: Secondary | ICD-10-CM | POA: Diagnosis not present

## 2019-02-07 DIAGNOSIS — I10 Essential (primary) hypertension: Secondary | ICD-10-CM

## 2019-02-07 LAB — CMP (CANCER CENTER ONLY)
ALT: 19 U/L (ref 0–44)
AST: 15 U/L (ref 15–41)
Albumin: 3.6 g/dL (ref 3.5–5.0)
Alkaline Phosphatase: 90 U/L (ref 38–126)
Anion gap: 7 (ref 5–15)
BUN: 11 mg/dL (ref 8–23)
CO2: 28 mmol/L (ref 22–32)
Calcium: 9.7 mg/dL (ref 8.9–10.3)
Chloride: 105 mmol/L (ref 98–111)
Creatinine: 0.83 mg/dL (ref 0.61–1.24)
GFR, Est AFR Am: >60 mL/min (ref >60)
GFR, Estimated: >60 mL/min (ref >60)
Glucose, Bld: 100 mg/dL — ABNORMAL HIGH (ref 70–99)
Potassium: 3.9 mmol/L (ref 3.5–5.1)
Sodium: 140 mmol/L (ref 135–145)
Total Bilirubin: 0.3 mg/dL (ref 0.3–1.2)
Total Protein: 7.2 g/dL (ref 6.5–8.1)

## 2019-02-07 LAB — CBC WITH DIFFERENTIAL (CANCER CENTER ONLY)
Abs Immature Granulocytes: 0.02 10*3/uL (ref 0.00–0.07)
Basophils Absolute: 0 10*3/uL (ref 0.0–0.1)
Basophils Relative: 1 %
Eosinophils Absolute: 0.2 10*3/uL (ref 0.0–0.5)
Eosinophils Relative: 4 %
HCT: 35.9 % — ABNORMAL LOW (ref 39.0–52.0)
Hemoglobin: 12.2 g/dL — ABNORMAL LOW (ref 13.0–17.0)
Immature Granulocytes: 1 %
Lymphocytes Relative: 26 %
Lymphs Abs: 1.1 10*3/uL (ref 0.7–4.0)
MCH: 32.3 pg (ref 26.0–34.0)
MCHC: 34 g/dL (ref 30.0–36.0)
MCV: 95 fL (ref 80.0–100.0)
Monocytes Absolute: 0.5 10*3/uL (ref 0.1–1.0)
Monocytes Relative: 12 %
Neutro Abs: 2.4 10*3/uL (ref 1.7–7.7)
Neutrophils Relative %: 56 %
Platelet Count: 249 10*3/uL (ref 150–400)
RBC: 3.78 MIL/uL — ABNORMAL LOW (ref 4.22–5.81)
RDW: 12.2 % (ref 11.5–15.5)
WBC Count: 4.2 10*3/uL (ref 4.0–10.5)
nRBC: 0 % (ref 0.0–0.2)

## 2019-02-07 MED ORDER — SODIUM CHLORIDE 0.9 % IV SOLN
20.0000 mg | Freq: Once | INTRAVENOUS | Status: AC
  Administered 2019-02-07: 20 mg via INTRAVENOUS
  Filled 2019-02-07: qty 20

## 2019-02-07 MED ORDER — PALONOSETRON HCL INJECTION 0.25 MG/5ML
INTRAVENOUS | Status: AC
  Filled 2019-02-07: qty 5

## 2019-02-07 MED ORDER — SODIUM CHLORIDE 0.9 % IV SOLN
45.0000 mg/m2 | Freq: Once | INTRAVENOUS | Status: AC
  Administered 2019-02-07: 15:00:00 90 mg via INTRAVENOUS
  Filled 2019-02-07: qty 15

## 2019-02-07 MED ORDER — FAMOTIDINE IN NACL 20-0.9 MG/50ML-% IV SOLN
20.0000 mg | Freq: Once | INTRAVENOUS | Status: AC
  Administered 2019-02-07: 20 mg via INTRAVENOUS

## 2019-02-07 MED ORDER — SODIUM CHLORIDE 0.9 % IV SOLN
188.2000 mg | Freq: Once | INTRAVENOUS | Status: AC
  Administered 2019-02-07: 190 mg via INTRAVENOUS
  Filled 2019-02-07: qty 19

## 2019-02-07 MED ORDER — DIPHENHYDRAMINE HCL 50 MG/ML IJ SOLN
50.0000 mg | Freq: Once | INTRAMUSCULAR | Status: AC
  Administered 2019-02-07: 50 mg via INTRAVENOUS

## 2019-02-07 MED ORDER — PALONOSETRON HCL INJECTION 0.25 MG/5ML
0.2500 mg | Freq: Once | INTRAVENOUS | Status: AC
  Administered 2019-02-07: 0.25 mg via INTRAVENOUS

## 2019-02-07 MED ORDER — DIPHENHYDRAMINE HCL 50 MG/ML IJ SOLN
INTRAMUSCULAR | Status: AC
  Filled 2019-02-07: qty 1

## 2019-02-07 MED ORDER — FAMOTIDINE IN NACL 20-0.9 MG/50ML-% IV SOLN
INTRAVENOUS | Status: AC
  Filled 2019-02-07: qty 50

## 2019-02-07 MED ORDER — SODIUM CHLORIDE 0.9 % IV SOLN
Freq: Once | INTRAVENOUS | Status: AC
  Administered 2019-02-07: 14:00:00 via INTRAVENOUS
  Filled 2019-02-07: qty 250

## 2019-02-07 NOTE — Patient Instructions (Signed)
[facility] Discharge Instructions for Patients Receiving Chemotherapy  Today you received the following chemotherapy agents: Taxol and Carboplatin.  To help prevent nausea and vomiting after your treatment, we encourage you to take your nausea medication as directed.   If you develop nausea and vomiting that is not controlled by your nausea medication, call the clinic.   BELOW ARE SYMPTOMS THAT SHOULD BE REPORTED IMMEDIATELY:  *FEVER GREATER THAN 100.5 F  *CHILLS WITH OR WITHOUT FEVER  NAUSEA AND VOMITING THAT IS NOT CONTROLLED WITH YOUR NAUSEA MEDICATION  *UNUSUAL SHORTNESS OF BREATH  *UNUSUAL BRUISING OR BLEEDING  TENDERNESS IN MOUTH AND THROAT WITH OR WITHOUT PRESENCE OF ULCERS  *URINARY PROBLEMS  *BOWEL PROBLEMS  UNUSUAL RASH Items with * indicate a potential emergency and should be followed up as soon as possible.  Feel free to call the clinic should you have any questions or concerns. The clinic phone number is [phone].  Please show the CHEMO ALERT CARD at check-in to the Emergency Department and triage nurse.  Paclitaxel injection What is this medicine? PACLITAXEL (PAK li TAX el) is a chemotherapy drug. It targets fast dividing cells, like cancer cells, and causes these cells to die. This medicine is used to treat ovarian cancer, breast cancer, lung cancer, Kaposi's sarcoma, and other cancers. This medicine may be used for other purposes; ask your health care provider or pharmacist if you have questions. COMMON BRAND NAME(S): Onxol, Taxol What should I tell my health care provider before I take this medicine? They need to know if you have any of these conditions:  history of irregular heartbeat  liver disease  low blood counts, like low white cell, platelet, or red cell counts  lung or breathing disease, like asthma  tingling of the fingers or toes, or other nerve disorder  an unusual or allergic reaction to paclitaxel, alcohol,  polyoxyethylated castor oil, other chemotherapy, other medicines, foods, dyes, or preservatives  pregnant or trying to get pregnant  breast-feeding How should I use this medicine? This drug is given as an infusion into a vein. It is administered in a hospital or clinic by a specially trained health care professional. Talk to your pediatrician regarding the use of this medicine in children. Special care may be needed. Overdosage: If you think you have taken too much of this medicine contact a poison control center or emergency room at once. NOTE: This medicine is only for you. Do not share this medicine with others. What if I miss a dose? It is important not to miss your dose. Call your doctor or health care professional if you are unable to keep an appointment. What may interact with this medicine? Do not take this medicine with any of the following medications:  disulfiram  metronidazole This medicine may also interact with the following medications:  antiviral medicines for hepatitis, HIV or AIDS  certain antibiotics like erythromycin and clarithromycin  certain medicines for fungal infections like ketoconazole and itraconazole  certain medicines for seizures like carbamazepine, phenobarbital, phenytoin  gemfibrozil  nefazodone  rifampin  St. John's wort This list may not describe all possible interactions. Give your health care provider a list of all the medicines, herbs, non-prescription drugs, or dietary supplements you use. Also tell them if you smoke, drink alcohol, or use illegal drugs. Some items may interact with your medicine. What should I watch for while using this medicine? Your condition will be monitored carefully while you are receiving this medicine. You will need important   blood work done while you are taking this medicine. This medicine can cause serious allergic reactions. To reduce your risk you will need to take other medicine(s) before treatment with this  medicine. If you experience allergic reactions like skin rash, itching or hives, swelling of the face, lips, or tongue, tell your doctor or health care professional right away. In some cases, you may be given additional medicines to help with side effects. Follow all directions for their use. This drug may make you feel generally unwell. This is not uncommon, as chemotherapy can affect healthy cells as well as cancer cells. Report any side effects. Continue your course of treatment even though you feel ill unless your doctor tells you to stop. Call your doctor or health care professional for advice if you get a fever, chills or sore throat, or other symptoms of a cold or flu. Do not treat yourself. This drug decreases your body's ability to fight infections. Try to avoid being around people who are sick. This medicine may increase your risk to bruise or bleed. Call your doctor or health care professional if you notice any unusual bleeding. Be careful brushing and flossing your teeth or using a toothpick because you may get an infection or bleed more easily. If you have any dental work done, tell your dentist you are receiving this medicine. Avoid taking products that contain aspirin, acetaminophen, ibuprofen, naproxen, or ketoprofen unless instructed by your doctor. These medicines may hide a fever. Do not become pregnant while taking this medicine. Women should inform their doctor if they wish to become pregnant or think they might be pregnant. There is a potential for serious side effects to an unborn child. Talk to your health care professional or pharmacist for more information. Do not breast-feed an infant while taking this medicine. Men are advised not to father a child while receiving this medicine. This product may contain alcohol. Ask your pharmacist or healthcare provider if this medicine contains alcohol. Be sure to tell all healthcare providers you are taking this medicine. Certain medicines,  like metronidazole and disulfiram, can cause an unpleasant reaction when taken with alcohol. The reaction includes flushing, headache, nausea, vomiting, sweating, and increased thirst. The reaction can last from 30 minutes to several hours. What side effects may I notice from receiving this medicine? Side effects that you should report to your doctor or health care professional as soon as possible:  allergic reactions like skin rash, itching or hives, swelling of the face, lips, or tongue  breathing problems  changes in vision  fast, irregular heartbeat  high or low blood pressure  mouth sores  pain, tingling, numbness in the hands or feet  signs of decreased platelets or bleeding - bruising, pinpoint red spots on the skin, black, tarry stools, blood in the urine  signs of decreased red blood cells - unusually weak or tired, feeling faint or lightheaded, falls  signs of infection - fever or chills, cough, sore throat, pain or difficulty passing urine  signs and symptoms of liver injury like dark yellow or brown urine; general ill feeling or flu-like symptoms; light-colored stools; loss of appetite; nausea; right upper belly pain; unusually weak or tired; yellowing of the eyes or skin  swelling of the ankles, feet, hands  unusually slow heartbeat Side effects that usually do not require medical attention (report to your doctor or health care professional if they continue or are bothersome):  diarrhea  hair loss  loss of appetite  muscle or joint   pain  nausea, vomiting  pain, redness, or irritation at site where injected  tiredness This list may not describe all possible side effects. Call your doctor for medical advice about side effects. You may report side effects to FDA at 1-800-FDA-1088. Where should I keep my medicine? This drug is given in a hospital or clinic and will not be stored at home. NOTE: This sheet is a summary. It may not cover all possible information.  If you have questions about this medicine, talk to your doctor, pharmacist, or health care provider.  2020 [employer] Standard (2017-01-06 13:14:55) Carboplatin injection What is this medicine? CARBOPLATIN (KAR boe pla tin) is a chemotherapy drug. It targets fast dividing cells, like cancer cells, and causes these cells to die. This medicine is used to treat ovarian cancer and many other cancers. This medicine may be used for other purposes; ask your health care provider or pharmacist if you have questions. COMMON BRAND NAME(S): Paraplatin What should I tell my health care provider before I take this medicine? They need to know if you have any of these conditions:  blood disorders  hearing problems  kidney disease  recent or ongoing radiation therapy  an unusual or allergic reaction to carboplatin, cisplatin, other chemotherapy, other medicines, foods, dyes, or preservatives  pregnant or trying to get pregnant  breast-feeding How should I use this medicine? This drug is usually given as an infusion into a vein. It is administered in a hospital or clinic by a specially trained health care professional. Talk to your pediatrician regarding the use of this medicine in children. Special care may be needed. Overdosage: If you think you have taken too much of this medicine contact a poison control center or emergency room at once. NOTE: This medicine is only for you. Do not share this medicine with others. What if I miss a dose? It is important not to miss a dose. Call your doctor or health care professional if you are unable to keep an appointment. What may interact with this medicine?  medicines for seizures  medicines to increase blood counts like filgrastim, pegfilgrastim, sargramostim  some antibiotics like amikacin, gentamicin, neomycin, streptomycin, tobramycin  vaccines Talk to your doctor or health care professional before taking any of these  medicines:  acetaminophen  aspirin  ibuprofen  ketoprofen  naproxen This list may not describe all possible interactions. Give your health care provider a list of all the medicines, herbs, non-prescription drugs, or dietary supplements you use. Also tell them if you smoke, drink alcohol, or use illegal drugs. Some items may interact with your medicine. What should I watch for while using this medicine? Your condition will be monitored carefully while you are receiving this medicine. You will need important blood work done while you are taking this medicine. This drug may make you feel generally unwell. This is not uncommon, as chemotherapy can affect healthy cells as well as cancer cells. Report any side effects. Continue your course of treatment even though you feel ill unless your doctor tells you to stop. In some cases, you may be given additional medicines to help with side effects. Follow all directions for their use. Call your doctor or health care professional for advice if you get a fever, chills or sore throat, or other symptoms of a cold or flu. Do not treat yourself. This drug decreases your body's ability to fight infections. Try to avoid being around people who are sick. This medicine may increase your risk to bruise or   bleed. Call your doctor or health care professional if you notice any unusual bleeding. Be careful brushing and flossing your teeth or using a toothpick because you may get an infection or bleed more easily. If you have any dental work done, tell your dentist you are receiving this medicine. Avoid taking products that contain aspirin, acetaminophen, ibuprofen, naproxen, or ketoprofen unless instructed by your doctor. These medicines may hide a fever. Do not become pregnant while taking this medicine. Women should inform their doctor if they wish to become pregnant or think they might be pregnant. There is a potential for serious side effects to an unborn child. Talk  to your health care professional or pharmacist for more information. Do not breast-feed an infant while taking this medicine. What side effects may I notice from receiving this medicine? Side effects that you should report to your doctor or health care professional as soon as possible:  allergic reactions like skin rash, itching or hives, swelling of the face, lips, or tongue  signs of infection - fever or chills, cough, sore throat, pain or difficulty passing urine  signs of decreased platelets or bleeding - bruising, pinpoint red spots on the skin, black, tarry stools, nosebleeds  signs of decreased red blood cells - unusually weak or tired, fainting spells, lightheadedness  breathing problems  changes in hearing  changes in vision  chest pain  high blood pressure  low blood counts - This drug may decrease the number of white blood cells, red blood cells and platelets. You may be at increased risk for infections and bleeding.  nausea and vomiting  pain, swelling, redness or irritation at the injection site  pain, tingling, numbness in the hands or feet  problems with balance, talking, walking  trouble passing urine or change in the amount of urine Side effects that usually do not require medical attention (report to your doctor or health care professional if they continue or are bothersome):  hair loss  loss of appetite  metallic taste in the mouth or changes in taste This list may not describe all possible side effects. Call your doctor for medical advice about side effects. You may report side effects to FDA at 1-800-FDA-1088. Where should I keep my medicine? This drug is given in a hospital or clinic and will not be stored at home. NOTE: This sheet is a summary. It may not cover all possible information. If you have questions about this medicine, talk to your doctor, pharmacist, or health care provider.  2020 [employer]/Gold Standard (2007-08-10 14:38:05)  

## 2019-02-07 NOTE — Progress Notes (Signed)
[place] [phone]   [phone]  OFFICE PROGRESS NOTE  [name], MD [address] [place] [address]  DIAGNOSIS:  Stage IV (T1b, N3, and 1C) non-small cell lung cancer, adenocarcinoma. 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  Biomarker Findings Tumor Mutational Burden - 10 Muts/Mb Microsatellite status - MS-Stable Genomic Findings For a complete list of the genes assayed, please refer to the Appendix. ERBB2 amplification - equivocal TWK46 K86* FANCC splice site [id number]>N SMAD4 Q289* TP53 E298* 7 Disease relevant genes with no reportable alterations: ALK, BRAF, EGFR, KRAS, MET, RET, ROS1   PDL1 expression is 30%.  PRIOR THERAPY: None  CURRENT THERAPY:  1) Weekly concurrent chemoradiation with carboplatin for an AUC of 2 and paclitaxel 45 mg/m2 to the locally advanced disease in the chest. He started the first dose 01/31/2019.  Status post 1 cycle. 2) palliative radiotherapy to the metastatic disease in the right adrenal gland.  INTERVAL HISTORY: Trevor Bailey 73 y.o. male returns to the clinic today for follow-up visit.  The patient is feeling fine today with no concerning complaints.  He recently underwent CT-guided core biopsy of the right adrenal mass and the final pathology was consistent with metastatic adenocarcinoma.  He started the course of concurrent chemoradiation to the chest last week.  He tolerated the first week of his treatment well.  He denied having any chest pain, shortness of breath except with exertion with no cough or hemoptysis.  He has no dysphagia or odynophagia.  He has no nausea, vomiting, diarrhea or constipation.  He is here today for evaluation before starting cycle #2.  MEDICAL HISTORY: Past Medical History:    Diagnosis Date   Adenopathy    left supraclavicular lymph node   Allergies    COPD (chronic obstructive pulmonary disease) (HCC)    Coronary disease    Diabetes (HCC)    Enlarged prostate    GERD (gastroesophageal reflux disease)    Glaucoma    Hyperlipidemia    Hypertension    Myocardial infarction ([place])    Sleep apnea    wears CPAP   Wears partial dentures    upper and lower    ALLERGIES:  has No Known Allergies.  MEDICATIONS:  Current Outpatient Medications  Medication Sig Dispense Refill   amLODipine (NORVASC) 5 MG tablet Take 1 tablet (5 mg total) by mouth daily. 90 tablet 3   aspirin EC 81 MG tablet Take 81 mg by mouth daily.     atorvastatin (LIPITOR) 80 MG tablet Take 1 tablet (80 mg total) by mouth daily. (Patient taking differently: Take 80 mg by mouth every evening. ) 90 tablet 3   augmented betamethasone dipropionate (DIPROLENE-AF) 0.05 % cream      cephALEXin (KEFLEX) 500 MG capsule TK 1 C PO Q 8 H FOR 7 DAYS     CLARITIN 10 MG tablet Take 10 mg by mouth daily.      clopidogrel (PLAVIX) 75 MG tablet Take 1 tablet (75 mg total) by mouth every evening. 30 tablet 0   finasteride (PROSCAR) 5 MG tablet Take 5 mg by mouth daily.      Fluticasone Furoate-Vilanterol (BREO ELLIPTA) 100-25 MCG/INH AEPB Inhale 1 puff into the lungs daily.     hydrochlorothiazide (HYDRODIURIL) 25 MG tablet Take 25 mg by  mouth daily.      latanoprost (XALATAN) 0.005 % ophthalmic solution Place 1 drop into both eyes at bedtime.      metFORMIN (GLUCOPHAGE) 1000 MG tablet Take 1,000 mg by mouth 2 (two) times daily.      metoprolol tartrate (LOPRESSOR) 25 MG tablet Take 25 mg 2 (two) times daily by mouth.     Multiple Vitamin (MULTIVITAMIN WITH MINERALS) TABS tablet Take 1 tablet by mouth daily. Centrum Silver     Multiple Vitamins-Minerals (PRESERVISION AREDS 2 PO) Take 1 tablet by mouth 2 (two) times daily.     nitroGLYCERIN (NITROSTAT) 0.4 MG SL tablet Place 1  tablet (0.4 mg total) under the tongue every 5 (five) minutes as needed for chest pain. (Patient taking differently: Place 0.4 mg under the tongue every 5 (five) minutes x 3 doses as needed for chest pain. ) 25 tablet 6   pantoprazole (PROTONIX) 40 MG tablet Take 40 mg daily by mouth.     Polyethyl Glycol-Propyl Glycol (SYSTANE) 0.4-0.3 % SOLN Place 1-2 drops into both eyes 3 (three) times daily as needed (dry/irritated eyes.).     prochlorperazine (COMPAZINE) 10 MG tablet Take 1 tablet (10 mg total) by mouth every 6 (six) hours as needed for nausea or vomiting. 30 tablet 2   ramipril (ALTACE) 10 MG capsule Take 10 mg by mouth 2 (two) times daily.      traMADol (ULTRAM) 50 MG tablet Take 1 tablet (50 mg total) by mouth every 6 (six) hours as needed. 20 tablet 0   VENTOLIN HFA 108 (90 Base) MCG/ACT inhaler Inhale 1-2 puffs into the lungs every 6 (six) hours as needed for wheezing or shortness of breath.   0   ZETIA 10 MG tablet Take 10 mg by mouth every evening.      No current facility-administered medications for this visit.     SURGICAL HISTORY:  Past Surgical History:  Procedure Laterality Date   ABDOMINAL AORTAGRAM Left 09/16/2013   Procedure: ABDOMINAL AORTAGRAM;  Surgeon: Elam Dutch, MD;  Location: [facility] CATH LAB;  Service: Cardiovascular;  Laterality: Left;   CARDIAC CATHETERIZATION  01/2011   When there was segmental stenosis of the distal RCA, patent PCA stent, patent circumflex stent, and a patent small diagonal with 90% ISR.    COLONOSCOPY W/ BIOPSIES AND POLYPECTOMY     CORONARY ANGIOPLASTY  may 2002   Non-DES stenting of his circumflex, non-DES stenting of the RCA   HYDROCELE EXCISION / REPAIR  2009   by Dr Jackquline Bosch HERNIA REPAIR Bilateral    Dr Bubba Camp   LYMPH NODE BIOPSY Left 01/10/2019   Procedure: left supraclavicular LYMPH NODE BIOPSY;  Surgeon: [name], MD;  Location: [facility];  Service: Thoracic;  Laterality: Left;   MULTIPLE TOOTH  EXTRACTIONS     NM [place]  01/08/2011   moderate in size and intensity area of reversible ischemia in the basal to mid inferior and septal territories. Abnormal study   stents  2008   proximal RCA, DES for progession of disease.   US ECHOCARDIOGRAPHY  01/12/2012   mild concentric LVH, borderline LA enlargement, mild to mod TR    REVIEW OF SYSTEMS:  A comprehensive review of systems was negative except for: Respiratory: positive for dyspnea on exertion   PHYSICAL EXAMINATION: General appearance: alert, cooperative and no distress Head: Normocephalic, without obvious abnormality, atraumatic Neck: no adenopathy, no JVD, supple, symmetrical, trachea midline and thyroid not enlarged, symmetric, no  tenderness/mass/nodules Lymph nodes: Cervical, supraclavicular, and axillary nodes normal. Resp: clear to auscultation bilaterally Back: symmetric, no curvature. ROM normal. No CVA tenderness. Cardio: regular rate and rhythm, S1, S2 normal, no murmur, click, rub or gallop GI: soft, non-tender; bowel sounds normal; no masses,  no organomegaly Extremities: extremities normal, atraumatic, no cyanosis or edema  ECOG PERFORMANCE STATUS: 1 - Symptomatic but completely ambulatory  Blood pressure 140/78, pulse 67, temperature 97.9 F (36.6 C), temperature source Temporal, resp. rate 18, height '5\' 9"'$  (1.753 m), weight 191 lb 12.8 oz (87 kg), SpO2 100 %.  LABORATORY DATA: Lab Results  Component Value Date   WBC 4.2 02/07/2019   HGB 12.2 (L) 02/07/2019   HCT 35.9 (L) 02/07/2019   MCV 95.0 02/07/2019   PLT 249 02/07/2019      Chemistry      Component Value Date/Time   NA 140 01/31/2019 0832   K 3.7 01/31/2019 0832   CL 105 01/31/2019 0832   CO2 26 01/31/2019 0832   BUN 12 01/31/2019 0832   CREATININE 0.83 01/31/2019 0832   CREATININE 0.94 05/16/2014 1109      Component Value Date/Time   CALCIUM 9.4 01/31/2019 0832   ALKPHOS 94 01/31/2019 0832   AST 19 01/31/2019 0832    ALT 27 01/31/2019 0832   BILITOT 0.4 01/31/2019 0832       RADIOGRAPHIC STUDIES: Dg Chest 2 View  Result Date: 01/10/2019 CLINICAL DATA:  Adenopathy.  Preoperative exam. EXAM: CHEST - 2 VIEW COMPARISON:  CT 11/18/2018.  Chest x-ray 04/18/2014. FINDINGS: Mediastinum hilar structures normal. Heart size normal. No pulmonary venous congestion. Left upper lung lesion noted, best identified by prior CT. Mild left mid lung field subsegmental atelectasis and or scarring. No pleural effusion pneumothorax. No acute bony abnormality. IMPRESSION: 1. Small left upper lung lesion noted, best identified by prior recent CT. 2. Mild left mid lung field subsegmental atelectasis and or scarring. Electronically Signed   By: Marcello Moores  Register   On: 01/10/2019 06:37   Mr Brain W Wo Contrast  Result Date: 01/21/2019 CLINICAL DATA:  Massive upper lobe of left lung, restaging lung cancer. EXAM: MRI HEAD WITHOUT AND WITH CONTRAST TECHNIQUE: Multiplanar, multiecho pulse sequences of the brain and surrounding structures were obtained without and with intravenous contrast. CONTRAST:  9 mL Gadavist COMPARISON:  Head CT 08/10/2009 FINDINGS: Brain: The axial acquired T2 weighted sequence is significantly motion degraded. No abnormal intracranial enhancement is demonstrated to suggest intracranial metastatic disease. No evidence of acute infarct. No intracranial mass, midline shift or extra-axial fluid collection. No chronic intracranial hemorrhage. Moderate scattered and confluent T2/FLAIR hyperintensity within the cerebral white matter is nonspecific, although consistent with chronic small vessel ischemic disease. Small chronic lacunar infarct within the right cerebellum. Cerebral volume is normal for age. Vascular: Flow voids maintained within the proximal large arterial vessels. Skull and upper cervical spine: Normal marrow signal. Sinuses/Orbits: Bilateral lens replacements. Within limitations of motion degraded imaging, no  significant paranasal sinus disease. Right mastoid effusion. IMPRESSION: No evidence of intracranial metastatic disease. 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 cerebellar lacunar infarct. Right mastoid effusion. Electronically Signed   By: Kellie Simmering   On: 01/21/2019 10:38   Ct Biopsy  Result Date: 01/28/2019 INDICATION: 73 year old male with a history likely metastatic disease, FDG avid lesion the right adrenal gland EXAM: CT BIOPSY MEDICATIONS: None. ANESTHESIA/SEDATION: Moderate (conscious) sedation was employed during this procedure. A total of Versed 2.0 mg and Fentanyl 100 mcg was administered intravenously.  Moderate Sedation Time: 21 minutes. The patient's level of consciousness and vital signs were monitored continuously by radiology nursing throughout the procedure under my direct supervision. FLUOROSCOPY TIME:  CT COMPLICATIONS: None PROCEDURE: Informed written consent was obtained from the patient after a thorough discussion of the procedural risks, benefits and alternatives. All questions were addressed. Maximal Sterile Barrier Technique was utilized including caps, mask, sterile gowns, sterile gloves, sterile drape, hand hygiene and skin antiseptic. A timeout was performed prior to the initiation of the procedure. Patient position in the right cubitus position on the CT gantry with scout CT images acquired. The patient is prepped and draped in the usual sterile fashion. 1% lidocaine was used for local anesthesia. Using CT guidance, 17 gauge introducer needle was advanced through a posterior approach, between the twelfth and eleventh ribs, ultimately with a trans renal pathway for the right adrenal gland nodule. Once we confirmed needle tip position, multiple 18 gauge core biopsy were acquired. Needle was removed and a final image was stored. Patient tolerated the procedure well and remained hemodynamically stable throughout. No complications were encountered and no  significant blood loss. IMPRESSION: Status post CT-guided biopsy of right adrenal gland nodule. Tissue specimen sent to pathology for complete histopathologic analysis. Signed, Dulcy Fanny. Dellia Nims, RPVI [facility] Radiology Electronically Signed   By: Corrie Mckusick D.O.   On: 01/28/2019 14:39    ASSESSMENT AND PLAN: This is a very pleasant 73 years old African-American male with stage IV non-small cell lung cancer presented with locally advanced disease in the chest as well as solitary metastasis to the right adrenal gland. The patient is currently undergoing a course of concurrent chemoradiation to the chest with weekly carboplatin for AUC of 2 and paclitaxel 45 mg/M2 status post 1 cycle.. I recommended for him to see Dr. [name] for evaluation and discussion of palliative radiotherapy to the metastatic disease in the right adrenal gland. The patient will continue his current treatment with concurrent chemoradiation with weekly carboplatin and paclitaxel and he will proceed with cycle #2 today. He will come back for follow-up visit in 2 weeks for evaluation before starting cycle #4. He was advised to call immediately if he has any concerning symptoms in the interval. The patient voices understanding of current disease status and treatment options and is in agreement with the current care plan.  All questions were answered. The patient knows to call the clinic with any problems, questions or concerns. We can certainly see the patient much sooner if necessary.  I spent 10 minutes counseling the patient face to face. The total time spent in the appointment was 15 minutes.  Disclaimer: This note was dictated with voice recognition software. Similar sounding words can inadvertently be transcribed and may not be corrected upon review.

## 2019-02-08 ENCOUNTER — Ambulatory Visit: Payer: Medicare Other

## 2019-02-08 ENCOUNTER — Other Ambulatory Visit: Payer: Self-pay

## 2019-02-08 ENCOUNTER — Ambulatory Visit
Admission: RE | Admit: 2019-02-08 | Discharge: 2019-02-08 | Disposition: A | Discharge status: Home / Self Care | Payer: Medicare Other | Source: Ambulatory Visit | Attending: Radiation Oncology | Admitting: Radiation Oncology

## 2019-02-08 DIAGNOSIS — C3412 Malignant neoplasm of upper lobe, left bronchus or lung: Secondary | ICD-10-CM | POA: Diagnosis not present

## 2019-02-09 ENCOUNTER — Ambulatory Visit
Admission: RE | Admit: 2019-02-09 | Discharge: 2019-02-09 | Disposition: A | Discharge status: Home / Self Care | Payer: Medicare Other | Source: Ambulatory Visit | Attending: Radiation Oncology | Admitting: Radiation Oncology

## 2019-02-09 ENCOUNTER — Other Ambulatory Visit: Payer: Self-pay

## 2019-02-09 DIAGNOSIS — C3412 Malignant neoplasm of upper lobe, left bronchus or lung: Secondary | ICD-10-CM | POA: Diagnosis not present

## 2019-02-10 ENCOUNTER — Other Ambulatory Visit: Payer: Self-pay

## 2019-02-10 ENCOUNTER — Ambulatory Visit: Payer: Medicare Other

## 2019-02-10 ENCOUNTER — Ambulatory Visit
Admission: RE | Admit: 2019-02-10 | Discharge: 2019-02-10 | Disposition: A | Discharge status: Home / Self Care | Payer: Medicare Other | Source: Ambulatory Visit | Attending: Radiation Oncology | Admitting: Radiation Oncology

## 2019-02-10 ENCOUNTER — Encounter: Payer: Self-pay | Admitting: *Deleted

## 2019-02-10 DIAGNOSIS — C3412 Malignant neoplasm of upper lobe, left bronchus or lung: Secondary | ICD-10-CM | POA: Diagnosis not present

## 2019-02-10 NOTE — Progress Notes (Signed)
Oncology Nurse Navigator Documentation  Oncology Nurse Navigator Flowsheets 02/10/2019  Diagnosis Status Confirmed Diagnosis Complete  Phase of Treatment Radiation  Navigator Follow Up Date: -  Navigator Follow Up Reason: -  Navigator Location [facility]-  Referral Date to RadOnc/MedOnc -  Navigator Encounter Type Other/I followed up on patients treatment plan schedule.  At this time appts are scheduled.   Telephone -  Abnormal Finding Date 11/18/2018  Confirmed Diagnosis Date 01/28/2019  Treatment Initiated Date -  Patient Visit Type -  Treatment Phase Treatment  Barriers/Navigation Needs Coordination of Care  Education -  Interventions Coordination of Care  Coordination of Care Other  Education Method -  Acuity Level 2-Minimal Needs (1-2 Barriers Identified)  Time Spent with Patient 30

## 2019-02-11 ENCOUNTER — Ambulatory Visit
Admission: RE | Admit: 2019-02-11 | Discharge: 2019-02-11 | Disposition: A | Discharge status: Home / Self Care | Payer: Medicare Other | Source: Ambulatory Visit | Attending: Radiation Oncology | Admitting: Radiation Oncology

## 2019-02-11 ENCOUNTER — Other Ambulatory Visit: Payer: Self-pay

## 2019-02-11 DIAGNOSIS — C3412 Malignant neoplasm of upper lobe, left bronchus or lung: Secondary | ICD-10-CM | POA: Diagnosis not present

## 2019-02-14 ENCOUNTER — Other Ambulatory Visit: Payer: Self-pay

## 2019-02-14 ENCOUNTER — Ambulatory Visit
Admission: RE | Admit: 2019-02-14 | Discharge: 2019-02-14 | Disposition: A | Discharge status: Home / Self Care | Payer: Medicare Other | Source: Ambulatory Visit | Attending: Radiation Oncology | Admitting: Radiation Oncology

## 2019-02-14 ENCOUNTER — Inpatient Hospital Stay: Payer: Medicare Other

## 2019-02-14 ENCOUNTER — Ambulatory Visit: Payer: Medicare Other | Admitting: Internal Medicine

## 2019-02-14 VITALS — BP 120/75 | HR 61 | Temp 97.8°F | Resp 18 | Wt 193.2 lb

## 2019-02-14 DIAGNOSIS — R918 Other nonspecific abnormal finding of lung field: Secondary | ICD-10-CM

## 2019-02-14 DIAGNOSIS — Z5111 Encounter for antineoplastic chemotherapy: Secondary | ICD-10-CM | POA: Diagnosis not present

## 2019-02-14 DIAGNOSIS — C3412 Malignant neoplasm of upper lobe, left bronchus or lung: Secondary | ICD-10-CM | POA: Diagnosis not present

## 2019-02-14 LAB — CMP (CANCER CENTER ONLY)
ALT: 17 U/L (ref 0–44)
AST: 15 U/L (ref 15–41)
Albumin: 3.5 g/dL (ref 3.5–5.0)
Alkaline Phosphatase: 80 U/L (ref 38–126)
Anion gap: 8 (ref 5–15)
BUN: 14 mg/dL (ref 8–23)
CO2: 25 mmol/L (ref 22–32)
Calcium: 9.1 mg/dL (ref 8.9–10.3)
Chloride: 109 mmol/L (ref 98–111)
Creatinine: 0.8 mg/dL (ref 0.61–1.24)
GFR, Est AFR Am: >60 mL/min (ref >60)
GFR, Estimated: >60 mL/min (ref >60)
Glucose, Bld: 124 mg/dL — ABNORMAL HIGH (ref 70–99)
Potassium: 4 mmol/L (ref 3.5–5.1)
Sodium: 142 mmol/L (ref 135–145)
Total Bilirubin: 0.2 mg/dL — ABNORMAL LOW (ref 0.3–1.2)
Total Protein: 6.9 g/dL (ref 6.5–8.1)

## 2019-02-14 LAB — CBC WITH DIFFERENTIAL (CANCER CENTER ONLY)
Abs Immature Granulocytes: 0.02 10*3/uL (ref 0.00–0.07)
Basophils Absolute: 0 10*3/uL (ref 0.0–0.1)
Basophils Relative: 1 %
Eosinophils Absolute: 0.1 10*3/uL (ref 0.0–0.5)
Eosinophils Relative: 4 %
HCT: 35.5 % — ABNORMAL LOW (ref 39.0–52.0)
Hemoglobin: 11.8 g/dL — ABNORMAL LOW (ref 13.0–17.0)
Immature Granulocytes: 1 %
Lymphocytes Relative: 17 %
Lymphs Abs: 0.6 10*3/uL — ABNORMAL LOW (ref 0.7–4.0)
MCH: 32 pg (ref 26.0–34.0)
MCHC: 33.2 g/dL (ref 30.0–36.0)
MCV: 96.2 fL (ref 80.0–100.0)
Monocytes Absolute: 0.4 10*3/uL (ref 0.1–1.0)
Monocytes Relative: 12 %
Neutro Abs: 2.4 10*3/uL (ref 1.7–7.7)
Neutrophils Relative %: 65 %
Platelet Count: 256 10*3/uL (ref 150–400)
RBC: 3.69 MIL/uL — ABNORMAL LOW (ref 4.22–5.81)
RDW: 12.7 % (ref 11.5–15.5)
WBC Count: 3.7 10*3/uL — ABNORMAL LOW (ref 4.0–10.5)
nRBC: 0 % (ref 0.0–0.2)

## 2019-02-14 MED ORDER — SODIUM CHLORIDE 0.9 % IV SOLN
20.0000 mg | Freq: Once | INTRAVENOUS | Status: AC
  Administered 2019-02-14: 20 mg via INTRAVENOUS
  Filled 2019-02-14: qty 20

## 2019-02-14 MED ORDER — PALONOSETRON HCL INJECTION 0.25 MG/5ML
0.2500 mg | Freq: Once | INTRAVENOUS | Status: AC
  Administered 2019-02-14: 11:00:00 0.25 mg via INTRAVENOUS

## 2019-02-14 MED ORDER — DIPHENHYDRAMINE HCL 50 MG/ML IJ SOLN
INTRAMUSCULAR | Status: AC
  Filled 2019-02-14: qty 1

## 2019-02-14 MED ORDER — SODIUM CHLORIDE 0.9 % IV SOLN
Freq: Once | INTRAVENOUS | Status: AC
  Administered 2019-02-14: 11:00:00 via INTRAVENOUS
  Filled 2019-02-14: qty 250

## 2019-02-14 MED ORDER — SODIUM CHLORIDE 0.9 % IV SOLN
45.0000 mg/m2 | Freq: Once | INTRAVENOUS | Status: AC
  Administered 2019-02-14: 90 mg via INTRAVENOUS
  Filled 2019-02-14: qty 15

## 2019-02-14 MED ORDER — DIPHENHYDRAMINE HCL 50 MG/ML IJ SOLN
50.0000 mg | Freq: Once | INTRAMUSCULAR | Status: AC
  Administered 2019-02-14: 11:00:00 50 mg via INTRAVENOUS

## 2019-02-14 MED ORDER — FAMOTIDINE IN NACL 20-0.9 MG/50ML-% IV SOLN
INTRAVENOUS | Status: AC
  Filled 2019-02-14: qty 50

## 2019-02-14 MED ORDER — PALONOSETRON HCL INJECTION 0.25 MG/5ML
INTRAVENOUS | Status: AC
  Filled 2019-02-14: qty 5

## 2019-02-14 MED ORDER — FAMOTIDINE IN NACL 20-0.9 MG/50ML-% IV SOLN
20.0000 mg | Freq: Once | INTRAVENOUS | Status: AC
  Administered 2019-02-14: 20 mg via INTRAVENOUS

## 2019-02-14 MED ORDER — SODIUM CHLORIDE 0.9 % IV SOLN
190.0000 mg | Freq: Once | INTRAVENOUS | Status: AC
  Administered 2019-02-14: 13:00:00 190 mg via INTRAVENOUS
  Filled 2019-02-14: qty 19

## 2019-02-14 NOTE — Patient Instructions (Signed)
   [facility] Discharge Instructions for Patients Receiving Chemotherapy  Today you received the following chemotherapy agents Taxol and Carboplatin   To help prevent nausea and vomiting after your treatment, we encourage you to take your nausea medication as directed.    If you develop nausea and vomiting that is not controlled by your nausea medication, call the clinic.   BELOW ARE SYMPTOMS THAT SHOULD BE REPORTED IMMEDIATELY:  *FEVER GREATER THAN 100.5 F  *CHILLS WITH OR WITHOUT FEVER  NAUSEA AND VOMITING THAT IS NOT CONTROLLED WITH YOUR NAUSEA MEDICATION  *UNUSUAL SHORTNESS OF BREATH  *UNUSUAL BRUISING OR BLEEDING  TENDERNESS IN MOUTH AND THROAT WITH OR WITHOUT PRESENCE OF ULCERS  *URINARY PROBLEMS  *BOWEL PROBLEMS  UNUSUAL RASH Items with * indicate a potential emergency and should be followed up as soon as possible.  Feel free to call the clinic should you have any questions or concerns. The clinic phone number is [phone].  Please show the CHEMO ALERT CARD at check-in to the Emergency Department and triage nurse.   

## 2019-02-15 ENCOUNTER — Other Ambulatory Visit: Payer: Self-pay

## 2019-02-15 ENCOUNTER — Ambulatory Visit
Admission: RE | Admit: 2019-02-15 | Discharge: 2019-02-15 | Disposition: A | Discharge status: Home / Self Care | Payer: Medicare Other | Source: Ambulatory Visit | Attending: Radiation Oncology | Admitting: Radiation Oncology

## 2019-02-15 DIAGNOSIS — C3412 Malignant neoplasm of upper lobe, left bronchus or lung: Secondary | ICD-10-CM | POA: Diagnosis not present

## 2019-02-16 ENCOUNTER — Ambulatory Visit: Payer: Medicare Other

## 2019-02-16 ENCOUNTER — Ambulatory Visit
Admission: RE | Admit: 2019-02-16 | Discharge: 2019-02-16 | Disposition: A | Discharge status: Home / Self Care | Payer: Medicare Other | Source: Ambulatory Visit | Attending: Radiation Oncology | Admitting: Radiation Oncology

## 2019-02-16 ENCOUNTER — Telehealth: Payer: Self-pay | Admitting: Medical Oncology

## 2019-02-16 DIAGNOSIS — C3412 Malignant neoplasm of upper lobe, left bronchus or lung: Secondary | ICD-10-CM | POA: Diagnosis not present

## 2019-02-16 NOTE — Telephone Encounter (Signed)
Constipation -LBM -2 days ago. He feels rectal fullness and pressure . He took Surveyor, minerals. I instructed him to get mag citrate .   xrt today-needs someone to call him and r/s appt

## 2019-02-17 ENCOUNTER — Ambulatory Visit
Admission: RE | Admit: 2019-02-17 | Discharge: 2019-02-17 | Disposition: A | Discharge status: Home / Self Care | Payer: Medicare Other | Source: Ambulatory Visit | Attending: Radiation Oncology | Admitting: Radiation Oncology

## 2019-02-17 ENCOUNTER — Other Ambulatory Visit: Payer: Self-pay

## 2019-02-17 DIAGNOSIS — Z510 Encounter for antineoplastic radiation therapy: Secondary | ICD-10-CM | POA: Diagnosis not present

## 2019-02-17 DIAGNOSIS — C3412 Malignant neoplasm of upper lobe, left bronchus or lung: Secondary | ICD-10-CM | POA: Insufficient documentation

## 2019-02-18 ENCOUNTER — Ambulatory Visit: Payer: Medicare Other | Admitting: Thoracic Surgery (Cardiothoracic Vascular Surgery)

## 2019-02-18 ENCOUNTER — Other Ambulatory Visit: Payer: Self-pay

## 2019-02-18 ENCOUNTER — Ambulatory Visit
Admission: RE | Admit: 2019-02-18 | Discharge: 2019-02-18 | Disposition: A | Discharge status: Home / Self Care | Payer: Medicare Other | Source: Ambulatory Visit | Attending: Radiation Oncology | Admitting: Radiation Oncology

## 2019-02-18 ENCOUNTER — Ambulatory Visit: Payer: Medicare Other

## 2019-02-18 DIAGNOSIS — C3412 Malignant neoplasm of upper lobe, left bronchus or lung: Secondary | ICD-10-CM | POA: Diagnosis not present

## 2019-02-21 ENCOUNTER — Encounter: Payer: Self-pay | Admitting: Internal Medicine

## 2019-02-21 ENCOUNTER — Other Ambulatory Visit: Payer: Self-pay

## 2019-02-21 ENCOUNTER — Ambulatory Visit
Admission: RE | Admit: 2019-02-21 | Discharge: 2019-02-21 | Disposition: A | Discharge status: Home / Self Care | Payer: Medicare Other | Source: Ambulatory Visit | Attending: Radiation Oncology | Admitting: Radiation Oncology

## 2019-02-21 ENCOUNTER — Inpatient Hospital Stay: Payer: Medicare Other | Admitting: Internal Medicine

## 2019-02-21 ENCOUNTER — Inpatient Hospital Stay: Payer: Medicare Other

## 2019-02-21 ENCOUNTER — Ambulatory Visit: Payer: Medicare Other

## 2019-02-21 ENCOUNTER — Inpatient Hospital Stay: Payer: Medicare Other | Attending: Internal Medicine

## 2019-02-21 VITALS — BP 129/82 | HR 71 | Temp 97.8°F | Resp 18 | Ht 69.0 in | Wt 194.1 lb

## 2019-02-21 DIAGNOSIS — C7971 Secondary malignant neoplasm of right adrenal gland: Secondary | ICD-10-CM | POA: Diagnosis not present

## 2019-02-21 DIAGNOSIS — R911 Solitary pulmonary nodule: Secondary | ICD-10-CM | POA: Insufficient documentation

## 2019-02-21 DIAGNOSIS — I10 Essential (primary) hypertension: Secondary | ICD-10-CM | POA: Diagnosis not present

## 2019-02-21 DIAGNOSIS — R918 Other nonspecific abnormal finding of lung field: Secondary | ICD-10-CM

## 2019-02-21 DIAGNOSIS — C3412 Malignant neoplasm of upper lobe, left bronchus or lung: Secondary | ICD-10-CM | POA: Insufficient documentation

## 2019-02-21 DIAGNOSIS — Z5111 Encounter for antineoplastic chemotherapy: Secondary | ICD-10-CM | POA: Diagnosis not present

## 2019-02-21 LAB — CMP (CANCER CENTER ONLY)
ALT: 14 U/L (ref 0–44)
AST: 12 U/L — ABNORMAL LOW (ref 15–41)
Albumin: 3.5 g/dL (ref 3.5–5.0)
Alkaline Phosphatase: 90 U/L (ref 38–126)
Anion gap: 8 (ref 5–15)
BUN: 14 mg/dL (ref 8–23)
CO2: 26 mmol/L (ref 22–32)
Calcium: 9.7 mg/dL (ref 8.9–10.3)
Chloride: 105 mmol/L (ref 98–111)
Creatinine: 0.84 mg/dL (ref 0.61–1.24)
GFR, Est AFR Am: >60 mL/min (ref >60)
GFR, Estimated: >60 mL/min (ref >60)
Glucose, Bld: 170 mg/dL — ABNORMAL HIGH (ref 70–99)
Potassium: 3.9 mmol/L (ref 3.5–5.1)
Sodium: 139 mmol/L (ref 135–145)
Total Bilirubin: 0.2 mg/dL — ABNORMAL LOW (ref 0.3–1.2)
Total Protein: 7 g/dL (ref 6.5–8.1)

## 2019-02-21 LAB — CBC WITH DIFFERENTIAL (CANCER CENTER ONLY)
Abs Immature Granulocytes: 0.03 10*3/uL (ref 0.00–0.07)
Basophils Absolute: 0 10*3/uL (ref 0.0–0.1)
Basophils Relative: 1 %
Eosinophils Absolute: 0.1 10*3/uL (ref 0.0–0.5)
Eosinophils Relative: 2 %
HCT: 35.6 % — ABNORMAL LOW (ref 39.0–52.0)
Hemoglobin: 11.9 g/dL — ABNORMAL LOW (ref 13.0–17.0)
Immature Granulocytes: 1 %
Lymphocytes Relative: 10 %
Lymphs Abs: 0.4 10*3/uL — ABNORMAL LOW (ref 0.7–4.0)
MCH: 32 pg (ref 26.0–34.0)
MCHC: 33.4 g/dL (ref 30.0–36.0)
MCV: 95.7 fL (ref 80.0–100.0)
Monocytes Absolute: 0.4 10*3/uL (ref 0.1–1.0)
Monocytes Relative: 10 %
Neutro Abs: 3.4 10*3/uL (ref 1.7–7.7)
Neutrophils Relative %: 76 %
Platelet Count: 251 10*3/uL (ref 150–400)
RBC: 3.72 MIL/uL — ABNORMAL LOW (ref 4.22–5.81)
RDW: 12.9 % (ref 11.5–15.5)
WBC Count: 4.3 10*3/uL (ref 4.0–10.5)
nRBC: 0 % (ref 0.0–0.2)

## 2019-02-21 MED ORDER — FAMOTIDINE IN NACL 20-0.9 MG/50ML-% IV SOLN
INTRAVENOUS | Status: AC
  Filled 2019-02-21: qty 50

## 2019-02-21 MED ORDER — DIPHENHYDRAMINE HCL 50 MG/ML IJ SOLN
50.0000 mg | Freq: Once | INTRAMUSCULAR | Status: AC
  Administered 2019-02-21: 13:00:00 50 mg via INTRAVENOUS

## 2019-02-21 MED ORDER — SODIUM CHLORIDE 0.9 % IV SOLN
188.2000 mg | Freq: Once | INTRAVENOUS | Status: AC
  Administered 2019-02-21: 190 mg via INTRAVENOUS
  Filled 2019-02-21: qty 19

## 2019-02-21 MED ORDER — FAMOTIDINE IN NACL 20-0.9 MG/50ML-% IV SOLN
20.0000 mg | Freq: Once | INTRAVENOUS | Status: AC
  Administered 2019-02-21: 14:00:00 20 mg via INTRAVENOUS

## 2019-02-21 MED ORDER — PALONOSETRON HCL INJECTION 0.25 MG/5ML
INTRAVENOUS | Status: AC
  Filled 2019-02-21: qty 5

## 2019-02-21 MED ORDER — PALONOSETRON HCL INJECTION 0.25 MG/5ML
0.2500 mg | Freq: Once | INTRAVENOUS | Status: AC
  Administered 2019-02-21: 0.25 mg via INTRAVENOUS

## 2019-02-21 MED ORDER — SODIUM CHLORIDE 0.9 % IV SOLN
45.0000 mg/m2 | Freq: Once | INTRAVENOUS | Status: AC
  Administered 2019-02-21: 15:00:00 90 mg via INTRAVENOUS
  Filled 2019-02-21: qty 15

## 2019-02-21 MED ORDER — SODIUM CHLORIDE 0.9 % IV SOLN
Freq: Once | INTRAVENOUS | Status: AC
  Administered 2019-02-21: 13:00:00 via INTRAVENOUS
  Filled 2019-02-21: qty 250

## 2019-02-21 MED ORDER — DIPHENHYDRAMINE HCL 50 MG/ML IJ SOLN
INTRAMUSCULAR | Status: AC
  Filled 2019-02-21: qty 1

## 2019-02-21 MED ORDER — SODIUM CHLORIDE 0.9 % IV SOLN
20.0000 mg | Freq: Once | INTRAVENOUS | Status: AC
  Administered 2019-02-21: 14:00:00 20 mg via INTRAVENOUS
  Filled 2019-02-21: qty 2

## 2019-02-21 NOTE — Progress Notes (Signed)
[place] [phone]   [phone]  OFFICE PROGRESS NOTE  [name], MD [address] [place] [address]  DIAGNOSIS:  Stage IV (T1b, N3, and 1C) non-small cell lung cancer, adenocarcinoma. 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  Biomarker Findings Tumor Mutational Burden - 10 Muts/Mb Microsatellite status - MS-Stable Genomic Findings For a complete list of the genes assayed, please refer to the Appendix. ERBB2 amplification - equivocal MPN36 R44* FANCC splice site 315+4M>G SMAD4 Q289* TP53 E298* 7 Disease relevant genes with no reportable alterations: ALK, BRAF, EGFR, KRAS, MET, RET, ROS1   PDL1 expression is 30%.  PRIOR THERAPY: None  CURRENT THERAPY:  1) Weekly concurrent chemoradiation with carboplatin for an AUC of 2 and paclitaxel 45 mg/m2 to the locally advanced disease in the chest. He started the first dose 01/31/2019.  Status post 3 cycles. 2) palliative radiotherapy to the metastatic disease in the right adrenal gland.  INTERVAL HISTORY: Trevor Bailey 73 y.o. male returns to the clinic today for follow-up visit.  The patient is feeling fine today with no concerning complaints except for mild soreness in his throat.  He denied having any current chest pain, shortness of breath, cough or hemoptysis.  He denied having any fever or chills.  He has no weight loss or night sweats.  He has no nausea, vomiting, diarrhea or constipation.  He continues to tolerate his course of concurrent chemoradiation fairly well.  He is here today for evaluation before starting cycle #4.   MEDICAL HISTORY: Past Medical History:  Diagnosis Date   Adenopathy    left supraclavicular lymph node   Allergies    COPD (chronic obstructive pulmonary  disease) (HCC)    Coronary disease    Diabetes (HCC)    Enlarged prostate    GERD (gastroesophageal reflux disease)    Glaucoma    Hyperlipidemia    Hypertension    Myocardial infarction ([place])    Sleep apnea    wears CPAP   Wears partial dentures    upper and lower    ALLERGIES:  has No Known Allergies.  MEDICATIONS:  Current Outpatient Medications  Medication Sig Dispense Refill   amLODipine (NORVASC) 5 MG tablet Take 1 tablet (5 mg total) by mouth daily. 90 tablet 3   aspirin EC 81 MG tablet Take 81 mg by mouth daily.     atorvastatin (LIPITOR) 80 MG tablet Take 1 tablet (80 mg total) by mouth daily. (Patient taking differently: Take 80 mg by mouth every evening. ) 90 tablet 3   augmented betamethasone dipropionate (DIPROLENE-AF) 0.05 % cream      cephALEXin (KEFLEX) 500 MG capsule TK 1 C PO Q 8 H FOR 7 DAYS     CLARITIN 10 MG tablet Take 10 mg by mouth daily.      clopidogrel (PLAVIX) 75 MG tablet Take 1 tablet (75 mg total) by mouth every evening. 30 tablet 0   finasteride (PROSCAR) 5 MG tablet Take 5 mg by mouth daily.      Fluticasone Furoate-Vilanterol (BREO ELLIPTA) 100-25 MCG/INH AEPB Inhale 1 puff into the lungs daily.     hydrochlorothiazide (HYDRODIURIL) 25 MG tablet Take 25 mg by mouth daily.      latanoprost (XALATAN) 0.005 % ophthalmic solution Place 1 drop into both eyes  at bedtime.      metFORMIN (GLUCOPHAGE) 1000 MG tablet Take 1,000 mg by mouth 2 (two) times daily.      metoprolol tartrate (LOPRESSOR) 25 MG tablet Take 25 mg 2 (two) times daily by mouth.     Multiple Vitamin (MULTIVITAMIN WITH MINERALS) TABS tablet Take 1 tablet by mouth daily. Centrum Silver     Multiple Vitamins-Minerals (PRESERVISION AREDS 2 PO) Take 1 tablet by mouth 2 (two) times daily.     nitroGLYCERIN (NITROSTAT) 0.4 MG SL tablet Place 1 tablet (0.4 mg total) under the tongue every 5 (five) minutes as needed for chest pain. (Patient taking differently: Place 0.4  mg under the tongue every 5 (five) minutes x 3 doses as needed for chest pain. ) 25 tablet 6   pantoprazole (PROTONIX) 40 MG tablet Take 40 mg daily by mouth.     Polyethyl Glycol-Propyl Glycol (SYSTANE) 0.4-0.3 % SOLN Place 1-2 drops into both eyes 3 (three) times daily as needed (dry/irritated eyes.).     prochlorperazine (COMPAZINE) 10 MG tablet Take 1 tablet (10 mg total) by mouth every 6 (six) hours as needed for nausea or vomiting. 30 tablet 2   ramipril (ALTACE) 10 MG capsule Take 10 mg by mouth 2 (two) times daily.      traMADol (ULTRAM) 50 MG tablet Take 1 tablet (50 mg total) by mouth every 6 (six) hours as needed. 20 tablet 0   VENTOLIN HFA 108 (90 Base) MCG/ACT inhaler Inhale 1-2 puffs into the lungs every 6 (six) hours as needed for wheezing or shortness of breath.   0   ZETIA 10 MG tablet Take 10 mg by mouth every evening.      No current facility-administered medications for this visit.     SURGICAL HISTORY:  Past Surgical History:  Procedure Laterality Date   ABDOMINAL AORTAGRAM Left 09/16/2013   Procedure: ABDOMINAL AORTAGRAM;  Surgeon: Elam Dutch, MD;  Location: [facility] CATH LAB;  Service: Cardiovascular;  Laterality: Left;   CARDIAC CATHETERIZATION  01/2011   When there was segmental stenosis of the distal RCA, patent PCA stent, patent circumflex stent, and a patent small diagonal with 90% ISR.    COLONOSCOPY W/ BIOPSIES AND POLYPECTOMY     CORONARY ANGIOPLASTY  may 2002   Non-DES stenting of his circumflex, non-DES stenting of the RCA   HYDROCELE EXCISION / REPAIR  2009   by Dr Jackquline Bosch HERNIA REPAIR Bilateral    Dr Bubba Camp   LYMPH NODE BIOPSY Left 01/10/2019   Procedure: left supraclavicular LYMPH NODE BIOPSY;  Surgeon: [name], MD;  Location: [facility];  Service: Thoracic;  Laterality: Left;   MULTIPLE TOOTH EXTRACTIONS     NM [place]  01/08/2011   moderate in size and intensity area of reversible ischemia in the basal to  mid inferior and septal territories. Abnormal study   stents  2008   proximal RCA, DES for progession of disease.   US ECHOCARDIOGRAPHY  01/12/2012   mild concentric LVH, borderline LA enlargement, mild to mod TR    REVIEW OF SYSTEMS:  A comprehensive review of systems was negative except for: Gastrointestinal: positive for odynophagia   PHYSICAL EXAMINATION: General appearance: alert, cooperative and no distress Head: Normocephalic, without obvious abnormality, atraumatic Neck: no adenopathy, no JVD, supple, symmetrical, trachea midline and thyroid not enlarged, symmetric, no tenderness/mass/nodules Lymph nodes: Cervical, supraclavicular, and axillary nodes normal. Resp: clear to auscultation bilaterally Back: symmetric, no curvature. ROM normal. No  CVA tenderness. Cardio: regular rate and rhythm, S1, S2 normal, no murmur, click, rub or gallop GI: soft, non-tender; bowel sounds normal; no masses,  no organomegaly Extremities: extremities normal, atraumatic, no cyanosis or edema  ECOG PERFORMANCE STATUS: 1 - Symptomatic but completely ambulatory  Blood pressure 129/82, pulse 71, temperature 97.8 F (36.6 C), temperature source Temporal, resp. rate 18, height '5\' 9"'$  (1.753 m), weight 194 lb 1.6 oz (88 kg), SpO2 100 %.  LABORATORY DATA: Lab Results  Component Value Date   WBC 4.3 02/21/2019   HGB 11.9 (L) 02/21/2019   HCT 35.6 (L) 02/21/2019   MCV 95.7 02/21/2019   PLT 251 02/21/2019      Chemistry      Component Value Date/Time   NA 139 02/21/2019 1136   K 3.9 02/21/2019 1136   CL 105 02/21/2019 1136   CO2 26 02/21/2019 1136   BUN 14 02/21/2019 1136   CREATININE 0.84 02/21/2019 1136   CREATININE 0.94 05/16/2014 1109      Component Value Date/Time   CALCIUM 9.7 02/21/2019 1136   ALKPHOS 90 02/21/2019 1136   AST 12 (L) 02/21/2019 1136   ALT 14 02/21/2019 1136   BILITOT 0.2 (L) 02/21/2019 1136       RADIOGRAPHIC STUDIES: Ct Biopsy  Result Date:  01/28/2019 INDICATION: 73 year old male with a history likely metastatic disease, FDG avid lesion the right adrenal gland EXAM: CT BIOPSY MEDICATIONS: None. ANESTHESIA/SEDATION: Moderate (conscious) sedation was employed during this procedure. A total of Versed 2.0 mg and Fentanyl 100 mcg was administered intravenously. Moderate Sedation Time: 21 minutes. The patient's level of consciousness and vital signs were monitored continuously by radiology nursing throughout the procedure under my direct supervision. FLUOROSCOPY TIME:  CT COMPLICATIONS: None PROCEDURE: Informed written consent was obtained from the patient after a thorough discussion of the procedural risks, benefits and alternatives. All questions were addressed. Maximal Sterile Barrier Technique was utilized including caps, mask, sterile gowns, sterile gloves, sterile drape, hand hygiene and skin antiseptic. A timeout was performed prior to the initiation of the procedure. Patient position in the right cubitus position on the CT gantry with scout CT images acquired. The patient is prepped and draped in the usual sterile fashion. 1% lidocaine was used for local anesthesia. Using CT guidance, 17 gauge introducer needle was advanced through a posterior approach, between the twelfth and eleventh ribs, ultimately with a trans renal pathway for the right adrenal gland nodule. Once we confirmed needle tip position, multiple 18 gauge core biopsy were acquired. Needle was removed and a final image was stored. Patient tolerated the procedure well and remained hemodynamically stable throughout. No complications were encountered and no significant blood loss. IMPRESSION: Status post CT-guided biopsy of right adrenal gland nodule. Tissue specimen sent to pathology for complete histopathologic analysis. Signed, Dulcy Fanny. Dellia Nims, RPVI [facility] Radiology Electronically Signed   By: Corrie Mckusick D.O.   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 The patient is currently undergoing a course of concurrent chemoradiation to the chest with weekly carboplatin for AUC of 2 and paclitaxel 45 mg/M2 status post 3 cycles.. He has been tolerating this treatment well with no concerning complaints except for mild odynophagia. I recommended for the patient to proceed with cycle #  4 today as planned. He is expected to have palliative radiotherapy to the right adrenal mass by the end of this treatment. He will come back for follow-up visit in 2 weeks for evaluation before starting cycle #6. He was advised to call immediately if he has any concerning symptoms in the interval. The patient voices understanding of current disease status and treatment options and is in agreement with the current care plan.  All questions were answered. The patient knows to call the clinic with any problems, questions or concerns. We can certainly see the patient much sooner if necessary.  I spent 10 minutes counseling the patient face to face. The total time spent in the appointment was 15 minutes.  Disclaimer: This note was dictated with voice recognition software. Similar sounding words can inadvertently be transcribed and may not be corrected upon review.

## 2019-02-21 NOTE — Patient Instructions (Signed)
[facility] Discharge Instructions for Patients Receiving Chemotherapy  Today you received the following chemotherapy agents:  Taxol, Carboplatin  To help prevent nausea and vomiting after your treatment, we encourage you to take your nausea medication as prescribed.   If you develop nausea and vomiting that is not controlled by your nausea medication, call the clinic.   BELOW ARE SYMPTOMS THAT SHOULD BE REPORTED IMMEDIATELY:  *FEVER GREATER THAN 100.5 F  *CHILLS WITH OR WITHOUT FEVER  NAUSEA AND VOMITING THAT IS NOT CONTROLLED WITH YOUR NAUSEA MEDICATION  *UNUSUAL SHORTNESS OF BREATH  *UNUSUAL BRUISING OR BLEEDING  TENDERNESS IN MOUTH AND THROAT WITH OR WITHOUT PRESENCE OF ULCERS  *URINARY PROBLEMS  *BOWEL PROBLEMS  UNUSUAL RASH Items with * indicate a potential emergency and should be followed up as soon as possible.  Feel free to call the clinic should you have any questions or concerns. The clinic phone number is [phone].  Please show the CHEMO ALERT CARD at check-in to the Emergency Department and triage nurse.   

## 2019-02-22 ENCOUNTER — Ambulatory Visit
Admission: RE | Admit: 2019-02-22 | Discharge: 2019-02-22 | Disposition: A | Discharge status: Home / Self Care | Payer: Medicare Other | Source: Ambulatory Visit | Attending: Radiation Oncology | Admitting: Radiation Oncology

## 2019-02-22 ENCOUNTER — Ambulatory Visit: Payer: Medicare Other

## 2019-02-22 ENCOUNTER — Other Ambulatory Visit: Payer: Self-pay

## 2019-02-22 DIAGNOSIS — C3412 Malignant neoplasm of upper lobe, left bronchus or lung: Secondary | ICD-10-CM | POA: Diagnosis not present

## 2019-02-23 ENCOUNTER — Other Ambulatory Visit: Payer: Self-pay

## 2019-02-23 ENCOUNTER — Telehealth: Payer: Self-pay | Admitting: *Deleted

## 2019-02-23 ENCOUNTER — Other Ambulatory Visit: Payer: Self-pay | Admitting: Radiation Oncology

## 2019-02-23 ENCOUNTER — Ambulatory Visit
Admission: RE | Admit: 2019-02-23 | Discharge: 2019-02-23 | Disposition: A | Discharge status: Home / Self Care | Payer: Medicare Other | Source: Ambulatory Visit | Attending: Radiation Oncology | Admitting: Radiation Oncology

## 2019-02-23 DIAGNOSIS — C3412 Malignant neoplasm of upper lobe, left bronchus or lung: Secondary | ICD-10-CM | POA: Diagnosis not present

## 2019-02-23 MED ORDER — SUCRALFATE 1 G PO TABS: 1.0000 g | ORAL_TABLET | Freq: Three times a day (TID) | ORAL | 1 refills | Status: DC

## 2019-02-23 NOTE — Telephone Encounter (Signed)
Spoke with the patient regarding a message I received from the treatment machine.  I let him know that we have sent in a prescription for carafate to his [place].  I let him know that the pill needs to be crushed and mixed in about one ounce of water and should be taken 4 times a day about 20 minutes before each meal and at night before he goes to bed.  He verbalized understanding.  Will continue to follow as necessary.  Gloriajean Dell. Leonie Green, BSN

## 2019-02-24 ENCOUNTER — Other Ambulatory Visit: Payer: Self-pay

## 2019-02-24 ENCOUNTER — Ambulatory Visit: Payer: Medicare Other

## 2019-02-24 ENCOUNTER — Ambulatory Visit
Admission: RE | Admit: 2019-02-24 | Discharge: 2019-02-24 | Disposition: A | Discharge status: Home / Self Care | Payer: Medicare Other | Source: Ambulatory Visit | Attending: Radiation Oncology | Admitting: Radiation Oncology

## 2019-02-24 DIAGNOSIS — C3412 Malignant neoplasm of upper lobe, left bronchus or lung: Secondary | ICD-10-CM | POA: Diagnosis not present

## 2019-02-25 ENCOUNTER — Ambulatory Visit
Admission: RE | Admit: 2019-02-25 | Discharge: 2019-02-25 | Disposition: A | Discharge status: Home / Self Care | Payer: Medicare Other | Source: Ambulatory Visit | Attending: Radiation Oncology | Admitting: Radiation Oncology

## 2019-02-25 ENCOUNTER — Ambulatory Visit: Payer: Medicare Other

## 2019-02-25 ENCOUNTER — Other Ambulatory Visit: Payer: Self-pay

## 2019-02-25 ENCOUNTER — Ambulatory Visit: Payer: Medicare Other | Admitting: Thoracic Surgery (Cardiothoracic Vascular Surgery)

## 2019-02-25 ENCOUNTER — Encounter: Payer: Self-pay | Admitting: Thoracic Surgery (Cardiothoracic Vascular Surgery)

## 2019-02-25 VITALS — BP 117/70 | HR 74 | Temp 96.4°F | Ht 69.0 in | Wt 192.6 lb

## 2019-02-25 DIAGNOSIS — R590 Localized enlarged lymph nodes: Secondary | ICD-10-CM | POA: Diagnosis not present

## 2019-02-25 DIAGNOSIS — Z9889 Other specified postprocedural states: Secondary | ICD-10-CM | POA: Diagnosis not present

## 2019-02-25 DIAGNOSIS — C3412 Malignant neoplasm of upper lobe, left bronchus or lung: Secondary | ICD-10-CM | POA: Diagnosis not present

## 2019-02-25 MED ORDER — SONAFINE EX EMUL
1.0000 "application " | Freq: Once | CUTANEOUS | Status: AC
  Administered 2019-02-25: 1 via TOPICAL

## 2019-02-25 NOTE — Progress Notes (Signed)
      [address]       [place] [address]             [phone]        Cope J Agostini Mason Medical Record #[id number] Date of Birth: [birth year]  Referring: [name], MD Primary Care: [name], MD Primary Cardiologist:Thomas Claiborne Billings, MD  Reason for visit:   follow-up  History of Present Illness:     Mr. Haverstock presents for his second follow-up appointment after undergoing a left supraclavicular lymph node biopsy.  He has done well.  He has no complaints.  He continues to see Dr. Earlie Server for his adjuvant therapy.  Physical Exam: BP 117/70 (BP Location: Right Arm)   Pulse 74   Temp (!) 96.4 F (35.8 C) (Skin)   Ht 5\' 9"  (1.753 m)   Wt 192 lb 9.6 oz (87.4 kg)   SpO2 97% Comment: RA  BMI 28.44 kg/m   Alert NAD  Incision well-healed.       Assessment / Plan:   73 yo male with 2cm left upper lobe pulmonary nodule, right adrenal nodule, and right pubic ramus pet avidity, and evidence of metastatic adenocarcinoma in a left supraclavicular lymph node.  Incision is well healed.    RTC prn   [name] 02/25/2019 11:31 AM

## 2019-02-28 ENCOUNTER — Ambulatory Visit
Admission: RE | Admit: 2019-02-28 | Discharge: 2019-02-28 | Disposition: A | Discharge status: Home / Self Care | Payer: Medicare Other | Source: Ambulatory Visit | Attending: Radiation Oncology | Admitting: Radiation Oncology

## 2019-02-28 ENCOUNTER — Inpatient Hospital Stay: Payer: Medicare Other

## 2019-02-28 ENCOUNTER — Ambulatory Visit: Payer: Medicare Other | Admitting: Internal Medicine

## 2019-02-28 ENCOUNTER — Other Ambulatory Visit: Payer: Self-pay

## 2019-02-28 VITALS — BP 131/70 | HR 68 | Temp 97.8°F | Resp 20

## 2019-02-28 DIAGNOSIS — R918 Other nonspecific abnormal finding of lung field: Secondary | ICD-10-CM

## 2019-02-28 DIAGNOSIS — C3412 Malignant neoplasm of upper lobe, left bronchus or lung: Secondary | ICD-10-CM

## 2019-02-28 DIAGNOSIS — Z5111 Encounter for antineoplastic chemotherapy: Secondary | ICD-10-CM | POA: Diagnosis not present

## 2019-02-28 LAB — CBC WITH DIFFERENTIAL (CANCER CENTER ONLY)
Abs Immature Granulocytes: 0.04 10*3/uL (ref 0.00–0.07)
Basophils Absolute: 0 10*3/uL (ref 0.0–0.1)
Basophils Relative: 1 %
Eosinophils Absolute: 0.1 10*3/uL (ref 0.0–0.5)
Eosinophils Relative: 4 %
HCT: 32.3 % — ABNORMAL LOW (ref 39.0–52.0)
Hemoglobin: 11.1 g/dL — ABNORMAL LOW (ref 13.0–17.0)
Immature Granulocytes: 1 %
Lymphocytes Relative: 12 %
Lymphs Abs: 0.4 10*3/uL — ABNORMAL LOW (ref 0.7–4.0)
MCH: 32.6 pg (ref 26.0–34.0)
MCHC: 34.4 g/dL (ref 30.0–36.0)
MCV: 95 fL (ref 80.0–100.0)
Monocytes Absolute: 0.4 10*3/uL (ref 0.1–1.0)
Monocytes Relative: 11 %
Neutro Abs: 2.5 10*3/uL (ref 1.7–7.7)
Neutrophils Relative %: 71 %
Platelet Count: 209 10*3/uL (ref 150–400)
RBC: 3.4 MIL/uL — ABNORMAL LOW (ref 4.22–5.81)
RDW: 12.9 % (ref 11.5–15.5)
WBC Count: 3.5 10*3/uL — ABNORMAL LOW (ref 4.0–10.5)
nRBC: 0 % (ref 0.0–0.2)

## 2019-02-28 LAB — CMP (CANCER CENTER ONLY)
ALT: 15 U/L (ref 0–44)
AST: 14 U/L — ABNORMAL LOW (ref 15–41)
Albumin: 3.3 g/dL — ABNORMAL LOW (ref 3.5–5.0)
Alkaline Phosphatase: 88 U/L (ref 38–126)
Anion gap: 10 (ref 5–15)
BUN: 12 mg/dL (ref 8–23)
CO2: 25 mmol/L (ref 22–32)
Calcium: 9.6 mg/dL (ref 8.9–10.3)
Chloride: 105 mmol/L (ref 98–111)
Creatinine: 0.77 mg/dL (ref 0.61–1.24)
GFR, Est AFR Am: >60 mL/min (ref >60)
GFR, Estimated: >60 mL/min (ref >60)
Glucose, Bld: 113 mg/dL — ABNORMAL HIGH (ref 70–99)
Potassium: 3.8 mmol/L (ref 3.5–5.1)
Sodium: 140 mmol/L (ref 135–145)
Total Bilirubin: 0.2 mg/dL — ABNORMAL LOW (ref 0.3–1.2)
Total Protein: 6.8 g/dL (ref 6.5–8.1)

## 2019-02-28 MED ORDER — SODIUM CHLORIDE 0.9 % IV SOLN
20.0000 mg | Freq: Once | INTRAVENOUS | Status: AC
  Administered 2019-02-28: 20 mg via INTRAVENOUS
  Filled 2019-02-28: qty 20

## 2019-02-28 MED ORDER — PALONOSETRON HCL INJECTION 0.25 MG/5ML
0.2500 mg | Freq: Once | INTRAVENOUS | Status: AC
  Administered 2019-02-28: 0.25 mg via INTRAVENOUS

## 2019-02-28 MED ORDER — DIPHENHYDRAMINE HCL 50 MG/ML IJ SOLN
INTRAMUSCULAR | Status: AC
  Filled 2019-02-28: qty 1

## 2019-02-28 MED ORDER — SODIUM CHLORIDE 0.9 % IV SOLN
Freq: Once | INTRAVENOUS | Status: AC
  Administered 2019-02-28: 11:00:00 via INTRAVENOUS
  Filled 2019-02-28: qty 250

## 2019-02-28 MED ORDER — SODIUM CHLORIDE 0.9 % IV SOLN
188.2000 mg | Freq: Once | INTRAVENOUS | Status: AC
  Administered 2019-02-28: 190 mg via INTRAVENOUS
  Filled 2019-02-28: qty 19

## 2019-02-28 MED ORDER — FAMOTIDINE IN NACL 20-0.9 MG/50ML-% IV SOLN
20.0000 mg | Freq: Once | INTRAVENOUS | Status: AC
  Administered 2019-02-28: 20 mg via INTRAVENOUS

## 2019-02-28 MED ORDER — SODIUM CHLORIDE 0.9 % IV SOLN
45.0000 mg/m2 | Freq: Once | INTRAVENOUS | Status: AC
  Administered 2019-02-28: 90 mg via INTRAVENOUS
  Filled 2019-02-28: qty 15

## 2019-02-28 MED ORDER — DIPHENHYDRAMINE HCL 50 MG/ML IJ SOLN
50.0000 mg | Freq: Once | INTRAMUSCULAR | Status: AC
  Administered 2019-02-28: 50 mg via INTRAVENOUS

## 2019-02-28 MED ORDER — PALONOSETRON HCL INJECTION 0.25 MG/5ML
INTRAVENOUS | Status: AC
  Filled 2019-02-28: qty 5

## 2019-02-28 MED ORDER — FAMOTIDINE IN NACL 20-0.9 MG/50ML-% IV SOLN
INTRAVENOUS | Status: AC
  Filled 2019-02-28: qty 50

## 2019-02-28 NOTE — Patient Instructions (Signed)
[facility] Discharge Instructions for Patients Receiving Chemotherapy  Today you received the following chemotherapy agents Taxol, Carboplatin  To help prevent nausea and vomiting after your treatment, we encourage you to take your nausea medication as directed  If you develop nausea and vomiting that is not controlled by your nausea medication, call the clinic.   BELOW ARE SYMPTOMS THAT SHOULD BE REPORTED IMMEDIATELY:  *FEVER GREATER THAN 100.5 F  *CHILLS WITH OR WITHOUT FEVER  NAUSEA AND VOMITING THAT IS NOT CONTROLLED WITH YOUR NAUSEA MEDICATION  *UNUSUAL SHORTNESS OF BREATH  *UNUSUAL BRUISING OR BLEEDING  TENDERNESS IN MOUTH AND THROAT WITH OR WITHOUT PRESENCE OF ULCERS  *URINARY PROBLEMS  *BOWEL PROBLEMS  UNUSUAL RASH Items with * indicate a potential emergency and should be followed up as soon as possible.  Feel free to call the clinic should you have any questions or concerns. The clinic phone number is [phone].  Please show the CHEMO ALERT CARD at check-in to the Emergency Department and triage nurse.   

## 2019-03-01 ENCOUNTER — Other Ambulatory Visit: Payer: Self-pay

## 2019-03-01 ENCOUNTER — Ambulatory Visit
Admission: RE | Admit: 2019-03-01 | Discharge: 2019-03-01 | Disposition: A | Discharge status: Home / Self Care | Payer: Medicare Other | Source: Ambulatory Visit | Attending: Radiation Oncology | Admitting: Radiation Oncology

## 2019-03-01 DIAGNOSIS — C3412 Malignant neoplasm of upper lobe, left bronchus or lung: Secondary | ICD-10-CM | POA: Diagnosis not present

## 2019-03-02 ENCOUNTER — Ambulatory Visit
Admission: RE | Admit: 2019-03-02 | Discharge: 2019-03-02 | Disposition: A | Discharge status: Home / Self Care | Payer: Medicare Other | Source: Ambulatory Visit | Attending: Radiation Oncology | Admitting: Radiation Oncology

## 2019-03-02 ENCOUNTER — Other Ambulatory Visit: Payer: Self-pay

## 2019-03-02 DIAGNOSIS — C3412 Malignant neoplasm of upper lobe, left bronchus or lung: Secondary | ICD-10-CM | POA: Diagnosis not present

## 2019-03-03 ENCOUNTER — Other Ambulatory Visit: Payer: Self-pay

## 2019-03-03 ENCOUNTER — Ambulatory Visit
Admission: RE | Admit: 2019-03-03 | Discharge: 2019-03-03 | Disposition: A | Discharge status: Home / Self Care | Payer: Medicare Other | Source: Ambulatory Visit | Attending: Radiation Oncology | Admitting: Radiation Oncology

## 2019-03-03 DIAGNOSIS — C3412 Malignant neoplasm of upper lobe, left bronchus or lung: Secondary | ICD-10-CM | POA: Diagnosis not present

## 2019-03-04 ENCOUNTER — Ambulatory Visit
Admission: RE | Admit: 2019-03-04 | Discharge: 2019-03-04 | Disposition: A | Discharge status: Home / Self Care | Payer: Medicare Other | Source: Ambulatory Visit | Attending: Radiation Oncology | Admitting: Radiation Oncology

## 2019-03-04 ENCOUNTER — Other Ambulatory Visit: Payer: Self-pay

## 2019-03-04 ENCOUNTER — Ambulatory Visit: Payer: Medicare Other

## 2019-03-04 DIAGNOSIS — C3412 Malignant neoplasm of upper lobe, left bronchus or lung: Secondary | ICD-10-CM | POA: Diagnosis not present

## 2019-03-07 ENCOUNTER — Encounter: Payer: Self-pay | Admitting: Internal Medicine

## 2019-03-07 ENCOUNTER — Inpatient Hospital Stay: Payer: Medicare Other

## 2019-03-07 ENCOUNTER — Inpatient Hospital Stay: Payer: Medicare Other | Admitting: Internal Medicine

## 2019-03-07 ENCOUNTER — Other Ambulatory Visit: Payer: Self-pay

## 2019-03-07 ENCOUNTER — Ambulatory Visit
Admission: RE | Admit: 2019-03-07 | Discharge: 2019-03-07 | Disposition: A | Discharge status: Home / Self Care | Payer: Medicare Other | Source: Ambulatory Visit | Attending: Radiation Oncology | Admitting: Radiation Oncology

## 2019-03-07 VITALS — BP 126/85 | HR 71 | Temp 97.6°F | Resp 71 | Ht 69.0 in | Wt 196.8 lb

## 2019-03-07 VITALS — Resp 18

## 2019-03-07 DIAGNOSIS — C3412 Malignant neoplasm of upper lobe, left bronchus or lung: Secondary | ICD-10-CM | POA: Diagnosis not present

## 2019-03-07 DIAGNOSIS — Z5111 Encounter for antineoplastic chemotherapy: Secondary | ICD-10-CM

## 2019-03-07 DIAGNOSIS — R918 Other nonspecific abnormal finding of lung field: Secondary | ICD-10-CM

## 2019-03-07 DIAGNOSIS — I10 Essential (primary) hypertension: Secondary | ICD-10-CM

## 2019-03-07 DIAGNOSIS — C3490 Malignant neoplasm of unspecified part of unspecified bronchus or lung: Secondary | ICD-10-CM | POA: Diagnosis not present

## 2019-03-07 LAB — CBC WITH DIFFERENTIAL (CANCER CENTER ONLY)
Abs Immature Granulocytes: 0.03 10*3/uL (ref 0.00–0.07)
Basophils Absolute: 0 10*3/uL (ref 0.0–0.1)
Basophils Relative: 1 %
Eosinophils Absolute: 0.1 10*3/uL (ref 0.0–0.5)
Eosinophils Relative: 4 %
HCT: 34.4 % — ABNORMAL LOW (ref 39.0–52.0)
Hemoglobin: 11.5 g/dL — ABNORMAL LOW (ref 13.0–17.0)
Immature Granulocytes: 1 %
Lymphocytes Relative: 10 %
Lymphs Abs: 0.3 10*3/uL — ABNORMAL LOW (ref 0.7–4.0)
MCH: 31.9 pg (ref 26.0–34.0)
MCHC: 33.4 g/dL (ref 30.0–36.0)
MCV: 95.6 fL (ref 80.0–100.0)
Monocytes Absolute: 0.5 10*3/uL (ref 0.1–1.0)
Monocytes Relative: 13 %
Neutro Abs: 2.5 10*3/uL (ref 1.7–7.7)
Neutrophils Relative %: 71 %
Platelet Count: 206 10*3/uL (ref 150–400)
RBC: 3.6 MIL/uL — ABNORMAL LOW (ref 4.22–5.81)
RDW: 13.7 % (ref 11.5–15.5)
WBC Count: 3.4 10*3/uL — ABNORMAL LOW (ref 4.0–10.5)
nRBC: 0 % (ref 0.0–0.2)

## 2019-03-07 LAB — CMP (CANCER CENTER ONLY)
ALT: 20 U/L (ref 0–44)
AST: 15 U/L (ref 15–41)
Albumin: 3.3 g/dL — ABNORMAL LOW (ref 3.5–5.0)
Alkaline Phosphatase: 89 U/L (ref 38–126)
Anion gap: 11 (ref 5–15)
BUN: 12 mg/dL (ref 8–23)
CO2: 27 mmol/L (ref 22–32)
Calcium: 9.6 mg/dL (ref 8.9–10.3)
Chloride: 104 mmol/L (ref 98–111)
Creatinine: 0.83 mg/dL (ref 0.61–1.24)
GFR, Est AFR Am: >60 mL/min (ref >60)
GFR, Estimated: >60 mL/min (ref >60)
Glucose, Bld: 144 mg/dL — ABNORMAL HIGH (ref 70–99)
Potassium: 4 mmol/L (ref 3.5–5.1)
Sodium: 142 mmol/L (ref 135–145)
Total Bilirubin: 0.3 mg/dL (ref 0.3–1.2)
Total Protein: 6.8 g/dL (ref 6.5–8.1)

## 2019-03-07 MED ORDER — FAMOTIDINE IN NACL 20-0.9 MG/50ML-% IV SOLN
20.0000 mg | Freq: Once | INTRAVENOUS | Status: AC
  Administered 2019-03-07: 20 mg via INTRAVENOUS

## 2019-03-07 MED ORDER — SODIUM CHLORIDE 0.9 % IV SOLN
20.0000 mg | Freq: Once | INTRAVENOUS | Status: AC
  Administered 2019-03-07: 12:00:00 20 mg via INTRAVENOUS
  Filled 2019-03-07: qty 2

## 2019-03-07 MED ORDER — SODIUM CHLORIDE 0.9 % IV SOLN
188.2000 mg | Freq: Once | INTRAVENOUS | Status: AC
  Administered 2019-03-07: 15:00:00 190 mg via INTRAVENOUS
  Filled 2019-03-07: qty 19

## 2019-03-07 MED ORDER — PALONOSETRON HCL INJECTION 0.25 MG/5ML
INTRAVENOUS | Status: AC
  Filled 2019-03-07: qty 5

## 2019-03-07 MED ORDER — DIPHENHYDRAMINE HCL 50 MG/ML IJ SOLN
INTRAMUSCULAR | Status: AC
  Filled 2019-03-07: qty 1

## 2019-03-07 MED ORDER — SODIUM CHLORIDE 0.9 % IV SOLN
45.0000 mg/m2 | Freq: Once | INTRAVENOUS | Status: AC
  Administered 2019-03-07: 13:00:00 90 mg via INTRAVENOUS
  Filled 2019-03-07: qty 15

## 2019-03-07 MED ORDER — DIPHENHYDRAMINE HCL 50 MG/ML IJ SOLN
50.0000 mg | Freq: Once | INTRAMUSCULAR | Status: AC
  Administered 2019-03-07: 12:00:00 50 mg via INTRAVENOUS

## 2019-03-07 MED ORDER — SODIUM CHLORIDE 0.9 % IV SOLN
Freq: Once | INTRAVENOUS | Status: AC
  Administered 2019-03-07: 11:00:00 via INTRAVENOUS
  Filled 2019-03-07: qty 250

## 2019-03-07 MED ORDER — PALONOSETRON HCL INJECTION 0.25 MG/5ML
0.2500 mg | Freq: Once | INTRAVENOUS | Status: AC
  Administered 2019-03-07: 0.25 mg via INTRAVENOUS

## 2019-03-07 MED ORDER — FAMOTIDINE IN NACL 20-0.9 MG/50ML-% IV SOLN
INTRAVENOUS | Status: AC
  Filled 2019-03-07: qty 50

## 2019-03-07 NOTE — Progress Notes (Signed)
[facility] [phone]   [phone]  OFFICE PROGRESS NOTE  [name], MD [address] [place] [address]  DIAGNOSIS:  Stage IV (T1b, N3, and 1C) non-small cell lung cancer, adenocarcinoma. 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  Biomarker Findings Tumor Mutational Burden - 10 Muts/Mb Microsatellite status - MS-Stable Genomic Findings For a complete list of the genes assayed, please refer to the Appendix. ERBB2 amplification - equivocal? HAL93 X90* FANCC splice site 240+9B>D SMAD4 Q289* TP53 E298* 7 Disease relevant genes with no reportable alterations: ALK, BRAF, EGFR, KRAS, MET, RET, ROS1   PDL1 expression is 30%.  PRIOR THERAPY: None  CURRENT THERAPY:  1) Weekly concurrent chemoradiation with carboplatin for an AUC of 2 and paclitaxel 45 mg/m2 to the locally advanced disease in the chest. He started the first dose 01/31/2019.  Status post 5 cycles. 2) palliative radiotherapy to the metastatic disease in the right adrenal gland.  INTERVAL HISTORY: Trevor Bailey 73 y.o. male returns to the clinic today for follow-up visit.  The patient is feeling fine today with no concerning complaints except for a skin burn from his radiation.  He also has mild odynophagia and currently on treatment with Carafate.  He denied having any chest pain, shortness of breath, cough or hemoptysis.  He denied having any fever or chills.  He has no nausea, vomiting, diarrhea or constipation.  He has no headache or visual changes.  He is here today for evaluation before starting cycle #6 of his treatment.  MEDICAL HISTORY: Past Medical History:  Diagnosis Date  . Adenopathy    left supraclavicular lymph node  . Allergies   . COPD (chronic obstructive pulmonary  disease) ([place])   . Coronary disease   . Diabetes ([place])   . Enlarged prostate   . GERD (gastroesophageal reflux disease)   . Glaucoma   . Hyperlipidemia   . Hypertension   . Myocardial infarction ([place])   . Sleep apnea    wears CPAP  . Wears partial dentures    upper and lower    ALLERGIES:  has No Known Allergies.  MEDICATIONS:  Current Outpatient Medications  Medication Sig Dispense Refill  . amLODipine (NORVASC) 5 MG tablet Take 1 tablet (5 mg total) by mouth daily. 90 tablet 3  . aspirin EC 81 MG tablet Take 81 mg by mouth daily.    Marland Kitchen atorvastatin (LIPITOR) 80 MG tablet Take 1 tablet (80 mg total) by mouth daily. 90 tablet 3  . augmented betamethasone dipropionate (DIPROLENE-AF) 0.05 % cream     . cephALEXin (KEFLEX) 500 MG capsule TK 1 C PO Q 8 H FOR 7 DAYS    . CLARITIN 10 MG tablet Take 10 mg by mouth daily.     . clopidogrel (PLAVIX) 75 MG tablet Take 1 tablet (75 mg total) by mouth every evening. 30 tablet 0  . finasteride (PROSCAR) 5 MG tablet Take 5 mg by mouth daily.     . hydrochlorothiazide (HYDRODIURIL) 25 MG tablet Take 25 mg by mouth daily.     Marland Kitchen latanoprost (XALATAN) 0.005 % ophthalmic solution Place 1 drop into both eyes at bedtime.     . metFORMIN (GLUCOPHAGE) 1000 MG tablet Take 1,000 mg by mouth 2 (two) times daily.     . metoprolol tartrate (  LOPRESSOR) 25 MG tablet Take 25 mg 2 (two) times daily by mouth.    . Multiple Vitamin (MULTIVITAMIN WITH MINERALS) TABS tablet Take 1 tablet by mouth daily. Centrum Silver    . Multiple Vitamins-Minerals (PRESERVISION AREDS 2 PO) Take 1 tablet by mouth 2 (two) times daily.    . nitroGLYCERIN (NITROSTAT) 0.4 MG SL tablet Place 1 tablet (0.4 mg total) under the tongue every 5 (five) minutes as needed for chest pain. (Patient not taking: Reported on 02/25/2019) 25 tablet 6  . pantoprazole (PROTONIX) 40 MG tablet Take 40 mg daily by mouth.    Vladimir Faster Glycol-Propyl Glycol (SYSTANE) 0.4-0.3 % SOLN Place 1-2 drops into both  eyes 3 (three) times daily as needed (dry/irritated eyes.).    Marland Kitchen prochlorperazine (COMPAZINE) 10 MG tablet Take 1 tablet (10 mg total) by mouth every 6 (six) hours as needed for nausea or vomiting. 30 tablet 2  . ramipril (ALTACE) 10 MG capsule Take 10 mg by mouth 2 (two) times daily.     . sucralfate (CARAFATE) 1 g tablet Take 1 tablet (1 g total) by mouth 4 (four) times daily -  with meals and at bedtime. Crush tablet and mix in 1 oz water and take po qAC and HS 120 tablet 1  . traMADol (ULTRAM) 50 MG tablet Take 1 tablet (50 mg total) by mouth every 6 (six) hours as needed. 20 tablet 0  . VENTOLIN HFA 108 (90 Base) MCG/ACT inhaler Inhale 1-2 puffs into the lungs every 6 (six) hours as needed for wheezing or shortness of breath.   0  . ZETIA 10 MG tablet Take 10 mg by mouth every evening.      No current facility-administered medications for this visit.     SURGICAL HISTORY:  Past Surgical History:  Procedure Laterality Date  . ABDOMINAL AORTAGRAM Left 09/16/2013   Procedure: ABDOMINAL Maxcine Ham;  Surgeon: Elam Dutch, MD;  Location: [facility] CATH LAB;  Service: Cardiovascular;  Laterality: Left;  . CARDIAC CATHETERIZATION  01/2011   When there was segmental stenosis of the distal RCA, patent PCA stent, patent circumflex stent, and a patent small diagonal with 90% ISR.   Marland Kitchen COLONOSCOPY W/ BIOPSIES AND POLYPECTOMY    . CORONARY ANGIOPLASTY  may 2002   Non-DES stenting of his circumflex, non-DES stenting of the RCA  . HYDROCELE EXCISION / REPAIR  2009   by Dr Janice Norrie  . INGUINAL HERNIA REPAIR Bilateral    Dr Bubba Camp  . LYMPH NODE BIOPSY Left 01/10/2019   Procedure: left supraclavicular LYMPH NODE BIOPSY;  Surgeon: [name], MD;  Location: [facility];  Service: Thoracic;  Laterality: Left;  Marland Kitchen MULTIPLE TOOTH EXTRACTIONS    . NM MYOCAR PERF WALL MOTION  01/08/2011   moderate in size and intensity area of reversible ischemia in the basal to mid inferior and septal territories. Abnormal study   . stents  2008   proximal RCA, DES for progession of disease.  Marland Kitchen US ECHOCARDIOGRAPHY  01/12/2012   mild concentric LVH, borderline LA enlargement, mild to mod TR    REVIEW OF SYSTEMS:  A comprehensive review of systems was negative except for: Gastrointestinal: positive for odynophagia Integument/breast: positive for rash   PHYSICAL EXAMINATION: General appearance: alert, cooperative and no distress Head: Normocephalic, without obvious abnormality, atraumatic Neck: no adenopathy, no JVD, supple, symmetrical, trachea midline and thyroid not enlarged, symmetric, no tenderness/mass/nodules Lymph nodes: Cervical, supraclavicular, and axillary nodes normal. Resp: clear to auscultation bilaterally Back: symmetric, no curvature.  ROM normal. No CVA tenderness. Cardio: regular rate and rhythm, S1, S2 normal, no murmur, click, rub or gallop GI: soft, non-tender; bowel sounds normal; no masses,  no organomegaly Extremities: extremities normal, atraumatic, no cyanosis or edema  ECOG PERFORMANCE STATUS: 1 - Symptomatic but completely ambulatory  Blood pressure 126/85, pulse 71, temperature 97.6 F (36.4 C), temperature source Temporal, resp. rate (!) 71, height '5\' 9"'$  (1.753 m), weight 196 lb 12.8 oz (89.3 kg), SpO2 100 %.  LABORATORY DATA: Lab Results  Component Value Date   WBC 3.4 (L) 03/07/2019   HGB 11.5 (L) 03/07/2019   HCT 34.4 (L) 03/07/2019   MCV 95.6 03/07/2019   PLT 206 03/07/2019      Chemistry      Component Value Date/Time   NA 140 02/28/2019 0954   K 3.8 02/28/2019 0954   CL 105 02/28/2019 0954   CO2 25 02/28/2019 0954   BUN 12 02/28/2019 0954   CREATININE 0.77 02/28/2019 0954   CREATININE 0.94 05/16/2014 1109      Component Value Date/Time   CALCIUM 9.6 02/28/2019 0954   ALKPHOS 88 02/28/2019 0954   AST 14 (L) 02/28/2019 0954   ALT 15 02/28/2019 0954   BILITOT 0.2 (L) 02/28/2019 0954       RADIOGRAPHIC STUDIES: No results found.  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 The patient is currently undergoing a course of concurrent chemoradiation to the chest with weekly carboplatin for AUC of 2 and paclitaxel 45 mg/M2 status post 5 cycles..  The patient is tolerating this treatment well except for fatigue as well as mild odynophagia and skin burn from the radiation. I recommended for him to proceed with cycle #6 today as planned. He is expected to complete this course of concurrent chemoradiation on March 16, 2019. He will also need palliative radiotherapy to the metastatic disease in the right adrenal gland. I will see the patient back for follow-up visit in 1 months for evaluation with repeat CT scan of the chest, abdomen and pelvis for restaging of his disease. He was advised to call immediately if he has any concerning symptoms in the interval. The patient voices understanding of current disease status and treatment options and is in agreement with the current care plan.  All questions were answered. The patient knows to call the clinic with any problems, questions or concerns. We can certainly see the patient much sooner if necessary.  I spent 10 minutes counseling the patient face to face. The total time spent in the appointment was 15 minutes.  Disclaimer: This note was dictated with voice recognition software. Similar sounding words can inadvertently be transcribed and may not be corrected upon review.

## 2019-03-07 NOTE — Patient Instructions (Signed)
[facility] Discharge Instructions for Patients Receiving Chemotherapy  Today you received the following chemotherapy agents Taxol, Carboplatin  To help prevent nausea and vomiting after your treatment, we encourage you to take your nausea medication as directed  If you develop nausea and vomiting that is not controlled by your nausea medication, call the clinic.   BELOW ARE SYMPTOMS THAT SHOULD BE REPORTED IMMEDIATELY:  *FEVER GREATER THAN 100.5 F  *CHILLS WITH OR WITHOUT FEVER  NAUSEA AND VOMITING THAT IS NOT CONTROLLED WITH YOUR NAUSEA MEDICATION  *UNUSUAL SHORTNESS OF BREATH  *UNUSUAL BRUISING OR BLEEDING  TENDERNESS IN MOUTH AND THROAT WITH OR WITHOUT PRESENCE OF ULCERS  *URINARY PROBLEMS  *BOWEL PROBLEMS  UNUSUAL RASH Items with * indicate a potential emergency and should be followed up as soon as possible.  Feel free to call the clinic should you have any questions or concerns. The clinic phone number is [phone].  Please show the CHEMO ALERT CARD at check-in to the Emergency Department and triage nurse.   

## 2019-03-07 NOTE — Progress Notes (Signed)
Keep Carboplatin dose at 190 mg today and next cycle per MD. Pt is about to complete this portion of his tx. Kennith Center, Pharm.D., CPP 03/07/2019@11 :28 AM

## 2019-03-08 ENCOUNTER — Other Ambulatory Visit: Payer: Self-pay

## 2019-03-08 ENCOUNTER — Ambulatory Visit
Admission: RE | Admit: 2019-03-08 | Discharge: 2019-03-08 | Disposition: A | Discharge status: Home / Self Care | Payer: Medicare Other | Source: Ambulatory Visit | Attending: Radiation Oncology | Admitting: Radiation Oncology

## 2019-03-08 ENCOUNTER — Telehealth: Payer: Self-pay | Admitting: Internal Medicine

## 2019-03-08 DIAGNOSIS — C3412 Malignant neoplasm of upper lobe, left bronchus or lung: Secondary | ICD-10-CM | POA: Diagnosis not present

## 2019-03-08 NOTE — Telephone Encounter (Signed)
Scheduled appt per 10/19 los - pt to get an updated schedule next visit

## 2019-03-09 ENCOUNTER — Ambulatory Visit
Admission: RE | Admit: 2019-03-09 | Discharge: 2019-03-09 | Disposition: A | Discharge status: Home / Self Care | Payer: Medicare Other | Source: Ambulatory Visit | Attending: Radiation Oncology | Admitting: Radiation Oncology

## 2019-03-09 ENCOUNTER — Other Ambulatory Visit: Payer: Self-pay

## 2019-03-09 DIAGNOSIS — C3412 Malignant neoplasm of upper lobe, left bronchus or lung: Secondary | ICD-10-CM | POA: Diagnosis not present

## 2019-03-10 ENCOUNTER — Other Ambulatory Visit: Payer: Self-pay

## 2019-03-10 ENCOUNTER — Ambulatory Visit
Admission: RE | Admit: 2019-03-10 | Discharge: 2019-03-10 | Disposition: A | Discharge status: Home / Self Care | Payer: Medicare Other | Source: Ambulatory Visit | Attending: Radiation Oncology | Admitting: Radiation Oncology

## 2019-03-10 DIAGNOSIS — C3412 Malignant neoplasm of upper lobe, left bronchus or lung: Secondary | ICD-10-CM | POA: Diagnosis not present

## 2019-03-11 ENCOUNTER — Other Ambulatory Visit: Payer: Self-pay

## 2019-03-11 ENCOUNTER — Ambulatory Visit
Admission: RE | Admit: 2019-03-11 | Discharge: 2019-03-11 | Disposition: A | Discharge status: Home / Self Care | Payer: Medicare Other | Source: Ambulatory Visit | Attending: Radiation Oncology | Admitting: Radiation Oncology

## 2019-03-11 DIAGNOSIS — C3412 Malignant neoplasm of upper lobe, left bronchus or lung: Secondary | ICD-10-CM | POA: Diagnosis not present

## 2019-03-14 ENCOUNTER — Ambulatory Visit
Admission: RE | Admit: 2019-03-14 | Discharge: 2019-03-14 | Disposition: A | Discharge status: Home / Self Care | Payer: Medicare Other | Source: Ambulatory Visit | Attending: Radiation Oncology | Admitting: Radiation Oncology

## 2019-03-14 ENCOUNTER — Inpatient Hospital Stay (HOSPITAL_BASED_OUTPATIENT_CLINIC_OR_DEPARTMENT_OTHER): Payer: Medicare Other | Admitting: Physician Assistant

## 2019-03-14 ENCOUNTER — Other Ambulatory Visit: Payer: Self-pay

## 2019-03-14 ENCOUNTER — Inpatient Hospital Stay: Payer: Medicare Other

## 2019-03-14 ENCOUNTER — Ambulatory Visit: Payer: Medicare Other

## 2019-03-14 VITALS — BP 118/72 | HR 74 | Temp 98.2°F | Resp 20 | Ht 69.0 in | Wt 197.7 lb

## 2019-03-14 DIAGNOSIS — C3412 Malignant neoplasm of upper lobe, left bronchus or lung: Secondary | ICD-10-CM | POA: Diagnosis not present

## 2019-03-14 DIAGNOSIS — Z5111 Encounter for antineoplastic chemotherapy: Secondary | ICD-10-CM | POA: Diagnosis not present

## 2019-03-14 DIAGNOSIS — B370 Candidal stomatitis: Secondary | ICD-10-CM

## 2019-03-14 DIAGNOSIS — R918 Other nonspecific abnormal finding of lung field: Secondary | ICD-10-CM

## 2019-03-14 LAB — CMP (CANCER CENTER ONLY)
ALT: 21 U/L (ref 0–44)
AST: 16 U/L (ref 15–41)
Albumin: 3.4 g/dL — ABNORMAL LOW (ref 3.5–5.0)
Alkaline Phosphatase: 88 U/L (ref 38–126)
Anion gap: 11 (ref 5–15)
BUN: 10 mg/dL (ref 8–23)
CO2: 24 mmol/L (ref 22–32)
Calcium: 9.7 mg/dL (ref 8.9–10.3)
Chloride: 105 mmol/L (ref 98–111)
Creatinine: 0.79 mg/dL (ref 0.61–1.24)
GFR, Est AFR Am: >60 mL/min (ref >60)
GFR, Estimated: >60 mL/min (ref >60)
Glucose, Bld: 133 mg/dL — ABNORMAL HIGH (ref 70–99)
Potassium: 4.1 mmol/L (ref 3.5–5.1)
Sodium: 140 mmol/L (ref 135–145)
Total Bilirubin: 0.2 mg/dL — ABNORMAL LOW (ref 0.3–1.2)
Total Protein: 7 g/dL (ref 6.5–8.1)

## 2019-03-14 LAB — CBC WITH DIFFERENTIAL (CANCER CENTER ONLY)
Abs Immature Granulocytes: 0.02 10*3/uL (ref 0.00–0.07)
Basophils Absolute: 0 10*3/uL (ref 0.0–0.1)
Basophils Relative: 1 %
Eosinophils Absolute: 0.1 10*3/uL (ref 0.0–0.5)
Eosinophils Relative: 2 %
HCT: 33.6 % — ABNORMAL LOW (ref 39.0–52.0)
Hemoglobin: 11.4 g/dL — ABNORMAL LOW (ref 13.0–17.0)
Immature Granulocytes: 1 %
Lymphocytes Relative: 13 %
Lymphs Abs: 0.4 10*3/uL — ABNORMAL LOW (ref 0.7–4.0)
MCH: 32.2 pg (ref 26.0–34.0)
MCHC: 33.9 g/dL (ref 30.0–36.0)
MCV: 94.9 fL (ref 80.0–100.0)
Monocytes Absolute: 0.4 10*3/uL (ref 0.1–1.0)
Monocytes Relative: 12 %
Neutro Abs: 2.3 10*3/uL (ref 1.7–7.7)
Neutrophils Relative %: 71 %
Platelet Count: 228 10*3/uL (ref 150–400)
RBC: 3.54 MIL/uL — ABNORMAL LOW (ref 4.22–5.81)
RDW: 13.8 % (ref 11.5–15.5)
WBC Count: 3.2 10*3/uL — ABNORMAL LOW (ref 4.0–10.5)
nRBC: 0 % (ref 0.0–0.2)

## 2019-03-14 MED ORDER — DIPHENHYDRAMINE HCL 50 MG/ML IJ SOLN
INTRAMUSCULAR | Status: AC
  Filled 2019-03-14: qty 1

## 2019-03-14 MED ORDER — SODIUM CHLORIDE 0.9 % IV SOLN
190.0000 mg | Freq: Once | INTRAVENOUS | Status: AC
  Administered 2019-03-14: 190 mg via INTRAVENOUS
  Filled 2019-03-14: qty 19

## 2019-03-14 MED ORDER — FAMOTIDINE IN NACL 20-0.9 MG/50ML-% IV SOLN
20.0000 mg | Freq: Once | INTRAVENOUS | Status: AC
  Administered 2019-03-14: 20 mg via INTRAVENOUS

## 2019-03-14 MED ORDER — DIPHENHYDRAMINE HCL 50 MG/ML IJ SOLN
50.0000 mg | Freq: Once | INTRAMUSCULAR | Status: AC
  Administered 2019-03-14: 50 mg via INTRAVENOUS

## 2019-03-14 MED ORDER — SODIUM CHLORIDE 0.9 % IV SOLN
Freq: Once | INTRAVENOUS | Status: AC
  Administered 2019-03-14: 12:00:00 via INTRAVENOUS
  Filled 2019-03-14: qty 250

## 2019-03-14 MED ORDER — SODIUM CHLORIDE 0.9 % IV SOLN
20.0000 mg | Freq: Once | INTRAVENOUS | Status: AC
  Administered 2019-03-14: 20 mg via INTRAVENOUS
  Filled 2019-03-14: qty 20

## 2019-03-14 MED ORDER — FLUCONAZOLE 100 MG PO TABS: 100.0000 mg | ORAL_TABLET | Freq: Every day | ORAL | 0 refills | Status: DC

## 2019-03-14 MED ORDER — PALONOSETRON HCL INJECTION 0.25 MG/5ML
0.2500 mg | Freq: Once | INTRAVENOUS | Status: AC
  Administered 2019-03-14: 0.25 mg via INTRAVENOUS

## 2019-03-14 MED ORDER — FAMOTIDINE IN NACL 20-0.9 MG/50ML-% IV SOLN
INTRAVENOUS | Status: AC
  Filled 2019-03-14: qty 50

## 2019-03-14 MED ORDER — SODIUM CHLORIDE 0.9 % IV SOLN
45.0000 mg/m2 | Freq: Once | INTRAVENOUS | Status: AC
  Administered 2019-03-14: 90 mg via INTRAVENOUS
  Filled 2019-03-14: qty 15

## 2019-03-14 MED ORDER — PALONOSETRON HCL INJECTION 0.25 MG/5ML
INTRAVENOUS | Status: AC
  Filled 2019-03-14: qty 5

## 2019-03-14 NOTE — Progress Notes (Signed)
[facility] OFFICE PROGRESS NOTE  [name], MD [address] [place] [address]  DIAGNOSIS: Stage IV (T1b, N3, and 1C) non-small cell lung cancer, adenocarcinoma. 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  Biomarker Findings Tumor Mutational Burden - 10 Muts/Mb Microsatellite status - MS-Stable Genomic Findings For a complete list of the genes assayed, please refer to the Appendix. ERBB2 amplification - equivocal? TWS56 C12* FANCC splice site 751+7G>Y SMAD4 Q289* TP53 E298* 7 Disease relevant genes with no reportable alterations: ALK, BRAF, EGFR, KRAS, MET, RET, ROS1   PDL1 expression is 30%.  PRIOR THERAPY: None  CURRENT THERAPY:  Weekly concurrent chemoradiation with carboplatin for an AUC of 2 and paclitaxel 45 mg/m2. First dose 01/31/2019. Status post 6 cycles  INTERVAL HISTORY: Trevor Bailey 73 y.o. male returns to the clinic for a follow up visit. The patient is feeling well today without any concerning complaints except for skin burns secondary to his radiation treatment. He also endorses some taste changes since starting chemotherapy. He has not lost any weight and denies any oral pain or odynophagia. The patient continues to tolerate treatment with chemotherapy well without any adverse effects. Denies any fever, chills, or night sweats. Denies any chest pain, shortness of breath, cough, or hemoptysis. Denies any nausea, vomiting, or diarrhea. He reports mild constipation which is well controlled with a OTC laxative. Denies any headache or visual changes. Denies any rashes or skin changes. His last day of radiation is scheduled for 03/16/2019. The patient is here today for evaluation prior to starting his cycle # 7, which is his last cycle of weekly chemotherapy.    MEDICAL HISTORY: Past Medical History:  Diagnosis Date  . Adenopathy    left supraclavicular lymph node  . Allergies   . COPD (chronic obstructive pulmonary disease) ([place])   . Coronary disease   . Diabetes ([place])   . Enlarged prostate   . GERD (gastroesophageal reflux disease)   . Glaucoma   . Hyperlipidemia   . Hypertension   . Myocardial infarction ([place])   . Sleep apnea    wears CPAP  . Wears partial dentures    upper and lower    ALLERGIES:  has No Known Allergies.  MEDICATIONS:  Current Outpatient Medications  Medication Sig Dispense Refill  . amLODipine (NORVASC) 5 MG tablet Take 1 tablet (5 mg total) by mouth daily. 90 tablet 3  . aspirin EC 81 MG tablet Take 81 mg by mouth daily.    Marland Kitchen atorvastatin (LIPITOR) 80 MG tablet Take 1 tablet (80 mg total) by mouth daily. 90 tablet 3  . augmented betamethasone dipropionate (DIPROLENE-AF) 0.05 % cream     . CLARITIN 10 MG tablet Take 10 mg by mouth daily.     . clopidogrel (PLAVIX) 75 MG tablet Take 1 tablet (75 mg total) by mouth every evening. 30 tablet 0  . finasteride (PROSCAR) 5 MG tablet Take 5 mg by mouth daily.     . fluconazole (DIFLUCAN) 100 MG tablet Take 1 tablet (100 mg total) by mouth daily. 7 tablet 0  . hydrochlorothiazide (HYDRODIURIL) 25 MG tablet Take 25 mg by mouth daily.     Marland Kitchen latanoprost (XALATAN) 0.005 % ophthalmic solution Place 1 drop into both eyes at bedtime.     . metFORMIN (GLUCOPHAGE) 1000 MG tablet Take 1,000  mg by mouth 2 (two) times daily.     . metoprolol tartrate (LOPRESSOR) 25 MG tablet Take 25 mg 2 (two) times daily by mouth.    . Multiple Vitamin (MULTIVITAMIN WITH MINERALS) TABS tablet Take 1 tablet by mouth daily. Centrum Silver    . Multiple Vitamins-Minerals (PRESERVISION AREDS 2 PO) Take 1 tablet by mouth 2 (two) times daily.    . nitroGLYCERIN (NITROSTAT) 0.4 MG SL tablet Place 1 tablet (0.4 mg total) under the tongue every 5 (five) minutes as needed for chest pain. (Patient not  taking: Reported on 02/25/2019) 25 tablet 6  . pantoprazole (PROTONIX) 40 MG tablet Take 40 mg daily by mouth.    Vladimir Faster Glycol-Propyl Glycol (SYSTANE) 0.4-0.3 % SOLN Place 1-2 drops into both eyes 3 (three) times daily as needed (dry/irritated eyes.).    Marland Kitchen prochlorperazine (COMPAZINE) 10 MG tablet Take 1 tablet (10 mg total) by mouth every 6 (six) hours as needed for nausea or vomiting. 30 tablet 2  . ramipril (ALTACE) 10 MG capsule Take 10 mg by mouth 2 (two) times daily.     . sucralfate (CARAFATE) 1 g tablet Take 1 tablet (1 g total) by mouth 4 (four) times daily -  with meals and at bedtime. Crush tablet and mix in 1 oz water and take po qAC and HS 120 tablet 1  . traMADol (ULTRAM) 50 MG tablet Take 1 tablet (50 mg total) by mouth every 6 (six) hours as needed. 20 tablet 0  . VENTOLIN HFA 108 (90 Base) MCG/ACT inhaler Inhale 1-2 puffs into the lungs every 6 (six) hours as needed for wheezing or shortness of breath.   0  . ZETIA 10 MG tablet Take 10 mg by mouth every evening.      No current facility-administered medications for this visit.     SURGICAL HISTORY:  Past Surgical History:  Procedure Laterality Date  . ABDOMINAL AORTAGRAM Left 09/16/2013   Procedure: ABDOMINAL Maxcine Ham;  Surgeon: Elam Dutch, MD;  Location: [facility] CATH LAB;  Service: Cardiovascular;  Laterality: Left;  . CARDIAC CATHETERIZATION  01/2011   When there was segmental stenosis of the distal RCA, patent PCA stent, patent circumflex stent, and a patent small diagonal with 90% ISR.   Marland Kitchen COLONOSCOPY W/ BIOPSIES AND POLYPECTOMY    . CORONARY ANGIOPLASTY  may 2002   Non-DES stenting of his circumflex, non-DES stenting of the RCA  . HYDROCELE EXCISION / REPAIR  2009   by Dr Janice Norrie  . INGUINAL HERNIA REPAIR Bilateral    Dr Bubba Camp  . LYMPH NODE BIOPSY Left 01/10/2019   Procedure: left supraclavicular LYMPH NODE BIOPSY;  Surgeon: [name], MD;  Location: [facility];  Service: Thoracic;  Laterality: Left;  Marland Kitchen  MULTIPLE TOOTH EXTRACTIONS    . NM MYOCAR PERF WALL MOTION  01/08/2011   moderate in size and intensity area of reversible ischemia in the basal to mid inferior and septal territories. Abnormal study  . stents  2008   proximal RCA, DES for progession of disease.  Marland Kitchen US ECHOCARDIOGRAPHY  01/12/2012   mild concentric LVH, borderline LA enlargement, mild to mod TR    REVIEW OF SYSTEMS:   Review of Systems  Constitutional: Negative for appetite change, chills, fatigue, fever and unexpected weight change.  HENT: Positive for taste alterations. Negative for mouth sores, nosebleeds, sore throat and trouble swallowing.   Eyes: Negative for eye problems and icterus.  Respiratory: Negative for cough, hemoptysis, shortness of breath and wheezing.  Cardiovascular: Negative for chest pain and leg swelling.  Gastrointestinal: Positive for mild constipation. Negative for abdominal pain, diarrhea, nausea and vomiting.  Genitourinary: Negative for bladder incontinence, difficulty urinating, dysuria, frequency and hematuria.   Musculoskeletal: Negative for back pain, gait problem, neck pain and neck stiffness.  Skin: Positive for radiation skin burn on his left anterior neck/collar region. Negative for itching and rash.  Neurological: Negative for dizziness, extremity weakness, gait problem, headaches, light-headedness and seizures.  Hematological: Negative for adenopathy. Does not bruise/bleed easily.  Psychiatric/Behavioral: Negative for confusion, depression and sleep disturbance. The patient is not nervous/anxious.     PHYSICAL EXAMINATION:  Blood pressure 118/72, pulse 74, temperature 98.2 F (36.8 C), resp. rate 20, height '5\' 9"'$  (1.753 m), weight 197 lb 11.2 oz (89.7 kg), SpO2 100 %.  ECOG PERFORMANCE STATUS: 1 - Symptomatic but completely ambulatory  Physical Exam  Constitutional: Oriented to person, place, and time and well-developed, well-nourished, and in no distress.  HENT:  Head:  Normocephalic and atraumatic.  Mouth/Throat: Thrush noted on his tongue. Oropharynx is clear and moist. No ulcerations.  Eyes: Conjunctivae are normal. Right eye exhibits no discharge. Left eye exhibits no discharge. No scleral icterus.  Neck: Normal range of motion. Neck supple.  Cardiovascular: Normal rate, regular rhythm, normal heart sounds and intact distal pulses.   Pulmonary/Chest: Effort normal and breath sounds normal. No respiratory distress. No wheezes. No rales.  Abdominal: Soft. Bowel sounds are normal. Exhibits no distension and no mass. There is no tenderness.  Musculoskeletal: Normal range of motion. Exhibits no edema.  Lymphadenopathy:    No cervical adenopathy.  Neurological: Alert and oriented to person, place, and time. Exhibits normal muscle tone. Gait normal. Coordination normal.  Skin: Radiation burn on his left anterior neck/collar region.  Skin is warm and dry. No rash noted. Not diaphoretic. No pallor.  Psychiatric: Mood, memory and judgment normal.  Vitals reviewed.  LABORATORY DATA: Lab Results  Component Value Date   WBC 3.2 (L) 03/14/2019   HGB 11.4 (L) 03/14/2019   HCT 33.6 (L) 03/14/2019   MCV 94.9 03/14/2019   PLT 228 03/14/2019      Chemistry      Component Value Date/Time   NA 140 03/14/2019 1005   K 4.1 03/14/2019 1005   CL 105 03/14/2019 1005   CO2 24 03/14/2019 1005   BUN 10 03/14/2019 1005   CREATININE 0.79 03/14/2019 1005   CREATININE 0.94 05/16/2014 1109      Component Value Date/Time   CALCIUM 9.7 03/14/2019 1005   ALKPHOS 88 03/14/2019 1005   AST 16 03/14/2019 1005   ALT 21 03/14/2019 1005   BILITOT 0.2 (L) 03/14/2019 1005       RADIOGRAPHIC STUDIES:  No results found.   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 He was diagnosed in August 2020.   The patient is currently undergoing a course of concurrent chemoradiation to the chest with weekly carboplatin for AUC of 2 and paclitaxel 45 mg/M2 status post 6 cycles. His last day of radiation is scheduled for 03/16/2019.   Labs were reviewed. I recommend for the patient to proceed with the last cycle of this treatment today, cycle #7.   We will see him back for a follow up visit  on 11/23 for evaluation and to review his restaging CT scan.  For his taste changes, the patient was noted to have oral thrush on physical exam. I will send diflucan to his pharmacy.   The patient was advised to call immediately if he has any concerning symptoms in the interval. The patient voices understanding of current disease status and treatment options and is in agreement with the current care plan. All questions were answered. The patient knows to call the clinic with any problems, questions or concerns. We can certainly see the patient much sooner if necessary   No orders of the defined types were placed in this encounter.    Cassandra L Heilingoetter, PA-C 03/14/19

## 2019-03-14 NOTE — Patient Instructions (Signed)
[facility] Discharge Instructions for Patients Receiving Chemotherapy  Today you received the following chemotherapy agents: Taxol, Carboplatin  To help prevent nausea and vomiting after your treatment, we encourage you to take your nausea medication as directed by your MD.   If you develop nausea and vomiting that is not controlled by your nausea medication, call the clinic.   BELOW ARE SYMPTOMS THAT SHOULD BE REPORTED IMMEDIATELY:  *FEVER GREATER THAN 100.5 F  *CHILLS WITH OR WITHOUT FEVER  NAUSEA AND VOMITING THAT IS NOT CONTROLLED WITH YOUR NAUSEA MEDICATION  *UNUSUAL SHORTNESS OF BREATH  *UNUSUAL BRUISING OR BLEEDING  TENDERNESS IN MOUTH AND THROAT WITH OR WITHOUT PRESENCE OF ULCERS  *URINARY PROBLEMS  *BOWEL PROBLEMS  UNUSUAL RASH Items with * indicate a potential emergency and should be followed up as soon as possible.  Feel free to call the clinic should you have any questions or concerns. The clinic phone number is [phone].  Please show the [facility] at check-in to the Emergency Department and triage nurse. Coronavirus (COVID-19) Are you at risk?  Are you at risk for the Coronavirus (COVID-19)?  To be considered HIGH RISK for Coronavirus (COVID-19), you have to meet the following criteria:  . Traveled to [place], [place], [place], [place] or [place]; or in the [place] to [place], [place], [place], or [place]; and have fever, cough, and shortness of breath within the last 2 weeks of travel OR . Been in close contact with a person diagnosed with COVID-19 within the last 2 weeks and have fever, cough, and shortness of breath . IF YOU DO NOT MEET THESE CRITERIA, YOU ARE CONSIDERED LOW RISK FOR COVID-19.  What to do if you are HIGH RISK for COVID-19?  Marland Kitchen If you are having a medical emergency, call 911. . Seek medical care right away. Before you go to a doctor's office, urgent care or emergency department, call ahead and tell  them about your recent travel, contact with someone diagnosed with COVID-19, and your symptoms. You should receive instructions from your physician's office regarding next steps of care.  . When you arrive at healthcare provider, tell the healthcare staff immediately you have returned from visiting [place], [place], [place], [place] or [place]; or traveled in the [place] to [place], [place], [place], or [place]; in the last two weeks or you have been in close contact with a person diagnosed with COVID-19 in the last 2 weeks.   . Tell the health care staff about your symptoms: fever, cough and shortness of breath. . After you have been seen by a medical provider, you will be either: o Tested for (COVID-19) and discharged home on quarantine except to seek medical care if symptoms worsen, and asked to  - Stay home and avoid contact with others until you get your results (4-5 days)  - Avoid travel on public transportation if possible (such as bus, train, or airplane) or o Sent to the Emergency Department by EMS for evaluation, COVID-19 testing, and possible admission depending on your condition and test results.  What to do if you are LOW RISK for COVID-19?  Reduce your risk of any infection by using the same precautions used for avoiding the common cold or flu:  Marland Kitchen Wash your hands often with soap and warm water for at least 20 seconds.  If soap and water are not readily available, use an alcohol-based hand sanitizer with at least 60% alcohol.  . If  coughing or sneezing, cover your mouth and nose by coughing or sneezing into the elbow areas of your shirt or coat, into a tissue or into your sleeve (not your hands). . Avoid shaking hands with others and consider head nods or verbal greetings only. . Avoid touching your eyes, nose, or mouth with unwashed hands.  . Avoid close contact with people who are sick. . Avoid places or events with large numbers of people in one location, like concerts or  sporting events. . Carefully consider travel plans you have or are making. . If you are planning any travel outside or inside the [place], visit the CDC's Travelers' Health webpage for the latest health notices. . If you have some symptoms but not all symptoms, continue to monitor at home and seek medical attention if your symptoms worsen. . If you are having a medical emergency, call 911.   [place] / e-Visit: [email or web address]         [facility]: [facility]: [phone]                   [facility]: [phone]

## 2019-03-14 NOTE — Progress Notes (Addendum)
Continue Carboplatin 190mg , AUC 2 today. Last dose of Carbo/Taxol today.  Hardie Pulley, PharmD, BCPS, BCOP

## 2019-03-15 ENCOUNTER — Other Ambulatory Visit: Payer: Self-pay

## 2019-03-15 ENCOUNTER — Ambulatory Visit
Admission: RE | Admit: 2019-03-15 | Discharge: 2019-03-15 | Disposition: A | Discharge status: Home / Self Care | Payer: Medicare Other | Source: Ambulatory Visit | Attending: Radiation Oncology | Admitting: Radiation Oncology

## 2019-03-15 ENCOUNTER — Ambulatory Visit: Payer: Medicare Other

## 2019-03-15 DIAGNOSIS — C3412 Malignant neoplasm of upper lobe, left bronchus or lung: Secondary | ICD-10-CM | POA: Diagnosis not present

## 2019-03-15 DIAGNOSIS — C7971 Secondary malignant neoplasm of right adrenal gland: Secondary | ICD-10-CM

## 2019-03-16 ENCOUNTER — Encounter: Payer: Self-pay | Admitting: Radiation Oncology

## 2019-03-16 ENCOUNTER — Ambulatory Visit: Payer: Medicare Other

## 2019-03-16 ENCOUNTER — Other Ambulatory Visit: Payer: Self-pay

## 2019-03-16 ENCOUNTER — Ambulatory Visit
Admission: RE | Admit: 2019-03-16 | Discharge: 2019-03-16 | Disposition: A | Discharge status: Home / Self Care | Payer: Medicare Other | Source: Ambulatory Visit | Attending: Radiation Oncology | Admitting: Radiation Oncology

## 2019-03-16 DIAGNOSIS — C3412 Malignant neoplasm of upper lobe, left bronchus or lung: Secondary | ICD-10-CM | POA: Diagnosis not present

## 2019-03-17 ENCOUNTER — Ambulatory Visit: Payer: Medicare Other

## 2019-03-24 DIAGNOSIS — C3412 Malignant neoplasm of upper lobe, left bronchus or lung: Secondary | ICD-10-CM | POA: Insufficient documentation

## 2019-03-24 DIAGNOSIS — Z510 Encounter for antineoplastic radiation therapy: Secondary | ICD-10-CM | POA: Insufficient documentation

## 2019-03-28 ENCOUNTER — Other Ambulatory Visit: Payer: Self-pay

## 2019-03-28 ENCOUNTER — Ambulatory Visit
Admission: RE | Admit: 2019-03-28 | Discharge: 2019-03-28 | Disposition: A | Discharge status: Home / Self Care | Payer: Medicare Other | Source: Ambulatory Visit | Attending: Radiation Oncology | Admitting: Radiation Oncology

## 2019-03-28 DIAGNOSIS — C3412 Malignant neoplasm of upper lobe, left bronchus or lung: Secondary | ICD-10-CM | POA: Diagnosis not present

## 2019-03-29 ENCOUNTER — Ambulatory Visit: Payer: Medicare Other | Admitting: Radiation Oncology

## 2019-03-30 ENCOUNTER — Ambulatory Visit
Admission: RE | Admit: 2019-03-30 | Discharge: 2019-03-30 | Disposition: A | Discharge status: Home / Self Care | Payer: Medicare Other | Source: Ambulatory Visit | Attending: Radiation Oncology | Admitting: Radiation Oncology

## 2019-03-30 ENCOUNTER — Other Ambulatory Visit: Payer: Self-pay

## 2019-03-30 DIAGNOSIS — C3412 Malignant neoplasm of upper lobe, left bronchus or lung: Secondary | ICD-10-CM | POA: Diagnosis not present

## 2019-03-31 ENCOUNTER — Ambulatory Visit: Payer: Medicare Other | Admitting: Radiation Oncology

## 2019-04-01 ENCOUNTER — Ambulatory Visit
Admission: RE | Admit: 2019-04-01 | Discharge: 2019-04-01 | Disposition: A | Discharge status: Home / Self Care | Payer: Medicare Other | Source: Ambulatory Visit | Attending: Radiation Oncology | Admitting: Radiation Oncology

## 2019-04-01 ENCOUNTER — Other Ambulatory Visit: Payer: Self-pay

## 2019-04-01 DIAGNOSIS — C3412 Malignant neoplasm of upper lobe, left bronchus or lung: Secondary | ICD-10-CM | POA: Diagnosis not present

## 2019-04-05 ENCOUNTER — Ambulatory Visit
Admission: RE | Admit: 2019-04-05 | Discharge: 2019-04-05 | Disposition: A | Discharge status: Home / Self Care | Payer: Medicare Other | Source: Ambulatory Visit | Attending: Radiation Oncology | Admitting: Radiation Oncology

## 2019-04-05 ENCOUNTER — Other Ambulatory Visit: Payer: Self-pay

## 2019-04-05 DIAGNOSIS — C3412 Malignant neoplasm of upper lobe, left bronchus or lung: Secondary | ICD-10-CM | POA: Diagnosis not present

## 2019-04-07 ENCOUNTER — Other Ambulatory Visit: Payer: Self-pay

## 2019-04-07 ENCOUNTER — Encounter: Payer: Self-pay | Admitting: Radiation Oncology

## 2019-04-07 ENCOUNTER — Ambulatory Visit
Admission: RE | Admit: 2019-04-07 | Discharge: 2019-04-07 | Disposition: A | Discharge status: Home / Self Care | Payer: Medicare Other | Source: Ambulatory Visit | Attending: Radiation Oncology | Admitting: Radiation Oncology

## 2019-04-07 DIAGNOSIS — C3412 Malignant neoplasm of upper lobe, left bronchus or lung: Secondary | ICD-10-CM | POA: Diagnosis not present

## 2019-04-07 NOTE — Progress Notes (Signed)
The patient was seen today by Dr. [name] and myself at the conclusion for his adrenal treatments. He is still having some left neck dry desquamation from his prior lung treatment that including his left supraclavicular region. He will follow up with Dr. Julien Nordmann next week but has otherwise done well with his xrt to the adrenal gland. We will follow up with him in one month by phone or sooner as needed.    Carola Rhine, PAC

## 2019-04-07 NOTE — Progress Notes (Signed)
  Radiation Oncology         [phone] ________________________________  Name: Trevor Bailey MRN: [id number]  Date: 03/16/2019  DOB: April 29, 1946  End of Treatment Note  Diagnosis:  Lung cancer     Indication for treatment::  curative       Radiation treatment dates:   01/31/19 - 03/16/19  Site/dose:   The patient was treated to the disease within the left lung initially to a dose of 60 Gy using a 4 field, 3-D conformal technique. The patient then received a cone down boost treatment for an additional 6 Gy. This yielded a final total dose of 66 Gy.   Narrative: The patient tolerated radiation treatment relatively well.   The patient did experience esophagitis during the course of treatment which required management.   Plan: The patient has completed radiation treatment. The patient will return to radiation oncology clinic for routine followup in one month. I advised the patient to call or return sooner if they have any questions or concerns related to their recovery or treatment. ________________________________  Jodelle Gross, M.D., Ph.D.

## 2019-04-08 ENCOUNTER — Ambulatory Visit (HOSPITAL_COMMUNITY)
Admission: RE | Admit: 2019-04-08 | Discharge: 2019-04-08 | Disposition: A | Discharge status: Home / Self Care | Payer: Medicare Other | Source: Ambulatory Visit | Attending: Internal Medicine | Admitting: Internal Medicine

## 2019-04-08 ENCOUNTER — Other Ambulatory Visit: Payer: Self-pay

## 2019-04-08 ENCOUNTER — Telehealth: Payer: Self-pay | Admitting: Internal Medicine

## 2019-04-08 ENCOUNTER — Inpatient Hospital Stay: Payer: Medicare Other | Attending: Internal Medicine

## 2019-04-08 DIAGNOSIS — C3412 Malignant neoplasm of upper lobe, left bronchus or lung: Secondary | ICD-10-CM | POA: Insufficient documentation

## 2019-04-08 DIAGNOSIS — C3490 Malignant neoplasm of unspecified part of unspecified bronchus or lung: Secondary | ICD-10-CM

## 2019-04-08 LAB — CBC WITH DIFFERENTIAL (CANCER CENTER ONLY)
Abs Immature Granulocytes: 0.01 10*3/uL (ref 0.00–0.07)
Basophils Absolute: 0 10*3/uL (ref 0.0–0.1)
Basophils Relative: 1 %
Eosinophils Absolute: 0.1 10*3/uL (ref 0.0–0.5)
Eosinophils Relative: 2 %
HCT: 36.6 % — ABNORMAL LOW (ref 39.0–52.0)
Hemoglobin: 12.1 g/dL — ABNORMAL LOW (ref 13.0–17.0)
Immature Granulocytes: 0 %
Lymphocytes Relative: 9 %
Lymphs Abs: 0.3 10*3/uL — ABNORMAL LOW (ref 0.7–4.0)
MCH: 32.4 pg (ref 26.0–34.0)
MCHC: 33.1 g/dL (ref 30.0–36.0)
MCV: 97.9 fL (ref 80.0–100.0)
Monocytes Absolute: 0.5 10*3/uL (ref 0.1–1.0)
Monocytes Relative: 15 %
Neutro Abs: 2.6 10*3/uL (ref 1.7–7.7)
Neutrophils Relative %: 73 %
Platelet Count: 253 10*3/uL (ref 150–400)
RBC: 3.74 MIL/uL — ABNORMAL LOW (ref 4.22–5.81)
RDW: 14.1 % (ref 11.5–15.5)
WBC Count: 3.6 10*3/uL — ABNORMAL LOW (ref 4.0–10.5)
nRBC: 0 % (ref 0.0–0.2)

## 2019-04-08 LAB — CMP (CANCER CENTER ONLY)
ALT: 20 U/L (ref 0–44)
AST: 19 U/L (ref 15–41)
Albumin: 3.6 g/dL (ref 3.5–5.0)
Alkaline Phosphatase: 112 U/L (ref 38–126)
Anion gap: 12 (ref 5–15)
BUN: 11 mg/dL (ref 8–23)
CO2: 26 mmol/L (ref 22–32)
Calcium: 9.9 mg/dL (ref 8.9–10.3)
Chloride: 105 mmol/L (ref 98–111)
Creatinine: 0.81 mg/dL (ref 0.61–1.24)
GFR, Est AFR Am: >60 mL/min (ref >60)
GFR, Estimated: >60 mL/min (ref >60)
Glucose, Bld: 106 mg/dL — ABNORMAL HIGH (ref 70–99)
Potassium: 3.7 mmol/L (ref 3.5–5.1)
Sodium: 143 mmol/L (ref 135–145)
Total Bilirubin: 0.3 mg/dL (ref 0.3–1.2)
Total Protein: 7.4 g/dL (ref 6.5–8.1)

## 2019-04-08 MED ORDER — SODIUM CHLORIDE (PF) 0.9 % IJ SOLN
INTRAMUSCULAR | Status: AC
  Filled 2019-04-08: qty 50

## 2019-04-08 MED ORDER — IOHEXOL 300 MG/ML  SOLN
100.0000 mL | Freq: Once | INTRAMUSCULAR | Status: AC | PRN
  Administered 2019-04-08: 100 mL via INTRAVENOUS

## 2019-04-08 NOTE — Telephone Encounter (Signed)
Converted 11/23 appointment to telephone visit. Confirmed with patient.

## 2019-04-11 ENCOUNTER — Encounter: Payer: Self-pay | Admitting: Internal Medicine

## 2019-04-11 ENCOUNTER — Inpatient Hospital Stay (HOSPITAL_BASED_OUTPATIENT_CLINIC_OR_DEPARTMENT_OTHER): Payer: Medicare Other | Admitting: Internal Medicine

## 2019-04-11 DIAGNOSIS — I10 Essential (primary) hypertension: Secondary | ICD-10-CM

## 2019-04-11 DIAGNOSIS — Z5111 Encounter for antineoplastic chemotherapy: Secondary | ICD-10-CM | POA: Diagnosis not present

## 2019-04-11 DIAGNOSIS — Z5112 Encounter for antineoplastic immunotherapy: Secondary | ICD-10-CM | POA: Insufficient documentation

## 2019-04-11 DIAGNOSIS — C3412 Malignant neoplasm of upper lobe, left bronchus or lung: Secondary | ICD-10-CM | POA: Diagnosis not present

## 2019-04-11 DIAGNOSIS — Z7189 Other specified counseling: Secondary | ICD-10-CM | POA: Diagnosis not present

## 2019-04-11 MED ORDER — CYANOCOBALAMIN 1000 MCG/ML IJ SOLN: 1000.0000 ug | Freq: Once | INTRAMUSCULAR | Status: DC

## 2019-04-11 MED ORDER — FOLIC ACID 1 MG PO TABS: 1.0000 mg | ORAL_TABLET | Freq: Every day | ORAL | 4 refills | Status: DC

## 2019-04-11 NOTE — Progress Notes (Signed)
[facility] [phone]   [phone]  PROGRESS NOTE FOR TELEMEDICINE VISITS  Trevor Bailey, Trevor Bailey [address] Trevor Bailey [address]  I connected with@ on 04/11/19 at 10:00 AM EST by telephone visit and verified that I am speaking with the correct person using two identifiers.   I discussed the limitations, risks, security and privacy concerns of performing an evaluation and management service by telemedicine and the availability of in-person appointments. I also discussed with the patient that there may be a patient responsible charge related to this service. The patient expressed understanding and agreed to proceed.  Other persons participating in the visit and their role in the encounter:  None  Patient's location: Home Provider's location: [place]  DIAGNOSIS: Stage IV (T1b, N3, M1c) non-small cell lung cancer, adenocarcinoma. 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  Biomarker Findings Tumor Mutational Burden - 10 Muts/Mb Microsatellite status - MS-Stable Genomic Findings For a complete list of the genes assayed, please refer to the Appendix. ERBB2 amplification - equivocal QXI50 T88* FANCC splice site [id number]>L SMAD4 Q289* TP53 E298* 7 Disease relevant genes with no reportable alterations: ALK, BRAF, EGFR, KRAS, MET, RET, ROS1   PDL1 expression is 30%.  PRIOR THERAPY:1) Weekly concurrent chemoradiation with carboplatin for an AUC of 2 and paclitaxel 45 mg/m2 to the locally advanced disease in the chest. He started the first dose 01/31/2019.  Status post 7 cycles.  Last dose of chemotherapy was given on March 14, 2019. 2) palliative radiotherapy to the metastatic disease in the right adrenal gland.  CURRENT THERAPY:Systemic  chemotherapy with carboplatin for AUC of 5, Alimta 500 mg/M2 and Keytruda 200 mg IV every 3 weeks.  First dose April 20, 2019   INTERVAL HISTORY: Trevor Bailey 73 y.o. male has a visual telephone visit with me today for evaluation and discussion of his recent scan results and recommendation regarding his condition.  The patient is feeling fine today with no concerning complaints.  He completed the course of palliative radiotherapy to the right adrenal gland recently.  He also completed a course of concurrent chemoradiation to the locally advanced disease in the chest.  He tolerated his previous treatment well.  He denied having any current chest pain, shortness of breath, cough or hemoptysis.  He denied having any fever or chills.  He has no nausea, vomiting, diarrhea or constipation.  He has no headache or visual changes.  He had repeat CT scan of the chest, abdomen pelvis performed recently and we are having the telephone visit for evaluation and discussion of his scan results and treatment options.  MEDICAL HISTORY: Past Medical History:  Diagnosis Date   Adenopathy    left supraclavicular lymph node   Allergies    COPD (chronic obstructive pulmonary disease) (HCC)    Coronary disease    Diabetes (HCC)    Enlarged prostate    GERD (gastroesophageal reflux disease)    Glaucoma    Hyperlipidemia    Hypertension    Myocardial infarction ([facility])    Sleep apnea    wears CPAP   Wears partial dentures    upper and lower    ALLERGIES:  has No Known Allergies.  MEDICATIONS:  Current Outpatient Medications  Medication Sig Dispense Refill   amLODipine (NORVASC) 5 MG tablet Take 1 tablet (5  mg total) by mouth daily. 90 tablet 3   aspirin EC 81 MG tablet Take 81 mg by mouth daily.     atorvastatin (LIPITOR) 80 MG tablet Take 1 tablet (80 mg total) by mouth daily. 90 tablet 3   augmented betamethasone dipropionate (DIPROLENE-AF) 0.05 % cream      CLARITIN 10 MG tablet Take  10 mg by mouth daily.      clopidogrel (PLAVIX) 75 MG tablet Take 1 tablet (75 mg total) by mouth every evening. 30 tablet 0   finasteride (PROSCAR) 5 MG tablet Take 5 mg by mouth daily.      fluconazole (DIFLUCAN) 100 MG tablet Take 1 tablet (100 mg total) by mouth daily. 7 tablet 0   hydrochlorothiazide (HYDRODIURIL) 25 MG tablet Take 25 mg by mouth daily.      latanoprost (XALATAN) 0.005 % ophthalmic solution Place 1 drop into both eyes at bedtime.      metFORMIN (GLUCOPHAGE) 1000 MG tablet Take 1,000 mg by mouth 2 (two) times daily.      metoprolol tartrate (LOPRESSOR) 25 MG tablet Take 25 mg 2 (two) times daily by mouth.     Multiple Vitamin (MULTIVITAMIN WITH MINERALS) TABS tablet Take 1 tablet by mouth daily. Centrum Silver     Multiple Vitamins-Minerals (PRESERVISION AREDS 2 PO) Take 1 tablet by mouth 2 (two) times daily.     nitroGLYCERIN (NITROSTAT) 0.4 MG SL tablet Place 1 tablet (0.4 mg total) under the tongue every 5 (five) minutes as needed for chest pain. (Patient not taking: Reported on 02/25/2019) 25 tablet 6   pantoprazole (PROTONIX) 40 MG tablet Take 40 mg daily by mouth.     Polyethyl Glycol-Propyl Glycol (SYSTANE) 0.4-0.3 % SOLN Place 1-2 drops into both eyes 3 (three) times daily as needed (dry/irritated eyes.).     prochlorperazine (COMPAZINE) 10 MG tablet Take 1 tablet (10 mg total) by mouth every 6 (six) hours as needed for nausea or vomiting. 30 tablet 2   ramipril (ALTACE) 10 MG capsule Take 10 mg by mouth 2 (two) times daily.      sucralfate (CARAFATE) 1 g tablet Take 1 tablet (1 g total) by mouth 4 (four) times daily -  with meals and at bedtime. Crush tablet and mix in 1 oz water and take po qAC and HS 120 tablet 1   traMADol (ULTRAM) 50 MG tablet Take 1 tablet (50 mg total) by mouth every 6 (six) hours as needed. 20 tablet 0   VENTOLIN HFA 108 (90 Base) MCG/ACT inhaler Inhale 1-2 puffs into the lungs every 6 (six) hours as needed for wheezing or  shortness of breath.   0   ZETIA 10 MG tablet Take 10 mg by mouth every evening.      No current facility-administered medications for this visit.     SURGICAL HISTORY:  Past Surgical History:  Procedure Laterality Date   ABDOMINAL AORTAGRAM Left 09/16/2013   Procedure: ABDOMINAL AORTAGRAM;  Surgeon: Elam Dutch, MD;  Location: [facility] CATH LAB;  Service: Cardiovascular;  Laterality: Left;   CARDIAC CATHETERIZATION  01/2011   When there was segmental stenosis of the distal RCA, patent PCA stent, patent circumflex stent, and a patent small diagonal with 90% ISR.    COLONOSCOPY W/ BIOPSIES AND POLYPECTOMY     CORONARY ANGIOPLASTY  may 2002   Non-DES stenting of his circumflex, non-DES stenting of the RCA   HYDROCELE EXCISION / REPAIR  2009   by Dr Jackquline Bosch HERNIA  REPAIR Bilateral    Dr Bubba Camp   LYMPH NODE BIOPSY Left 01/10/2019   Procedure: left supraclavicular LYMPH NODE BIOPSY;  Surgeon: [name], MD;  Location: [facility];  Service: Thoracic;  Laterality: Left;   MULTIPLE TOOTH EXTRACTIONS     NM [place]  01/08/2011   moderate in size and intensity area of reversible ischemia in the basal to mid inferior and septal territories. Abnormal study   stents  2008   proximal RCA, DES for progession of disease.   US ECHOCARDIOGRAPHY  01/12/2012   mild concentric LVH, borderline LA enlargement, mild to mod TR    REVIEW OF SYSTEMS:  Constitutional: positive for fatigue Eyes: negative Ears, nose, mouth, throat, and face: negative Respiratory: positive for dyspnea on exertion Cardiovascular: negative Gastrointestinal: negative Genitourinary:negative Integument/breast: negative Hematologic/lymphatic: negative Musculoskeletal:negative Neurological: negative Behavioral/Psych: negative Endocrine: negative Allergic/Immunologic: negative    LABORATORY DATA: Lab Results  Component Value Date   WBC 3.6 (L) 04/08/2019   HGB 12.1 (L) 04/08/2019    HCT 36.6 (L) 04/08/2019   MCV 97.9 04/08/2019   PLT 253 04/08/2019      Chemistry      Component Value Date/Time   NA 143 04/08/2019 1236   K 3.7 04/08/2019 1236   CL 105 04/08/2019 1236   CO2 26 04/08/2019 1236   BUN 11 04/08/2019 1236   CREATININE 0.81 04/08/2019 1236   CREATININE 0.94 05/16/2014 1109      Component Value Date/Time   CALCIUM 9.9 04/08/2019 1236   ALKPHOS 112 04/08/2019 1236   AST 19 04/08/2019 1236   ALT 20 04/08/2019 1236   BILITOT 0.3 04/08/2019 1236       RADIOGRAPHIC STUDIES: Ct Chest W Contrast  Result Date: 04/08/2019 CLINICAL DATA:  Lung cancer diagnosed August 2020. Radiation therapy and chemotherapy complete. EXAM: CT CHEST, ABDOMEN, AND PELVIS WITH CONTRAST TECHNIQUE: Multidetector CT imaging of the chest, abdomen and pelvis was performed following the standard protocol during bolus administration of intravenous contrast. CONTRAST:  171m OMNIPAQUE IOHEXOL 300 MG/ML  SOLN COMPARISON:  PET-CT 12/06/2018 FINDINGS: CT CHEST FINDINGS Cardiovascular: Coronary artery calcification and aortic atherosclerotic calcification. Mediastinum/Nodes: No axillary or supraclavicular adenopathy. No mediastinal hilar adenopathy. Pericardial fluid. Esophagus normal. Lungs/Pleura: The LEFT upper lobe previously hypermetabolic nodule measures 13 mm x 8 mm compared with 14 mm x 9 mm for slight reduction in volume. No new pulmonary nodularity. Band of atelectasis in the LEFT lower lobe is similar Musculoskeletal: No aggressive osseous lesion. CT ABDOMEN AND PELVIS FINDINGS Hepatobiliary: Multiple benign hepatic cysts. Gallbladder normal. Pancreas: Pancreas is normal. No ductal dilatation. No pancreatic inflammation. Spleen: Normal spleen Adrenals/urinary tract: Enlarged RIGHT adrenal gland is not significantly changed measuring 18 mm 34 mm compared with 14 mm 36 mm. The lateral limb appears slightly more thickened measuring 14 mm (image 59/2) compared with 13 mm. This adrenal gland  was hypermetabolic on comparison PET-CT scan. LEFT adrenal gland normal. Kidneys a benign-appearing cysts. Ureters and bladder normal Stomach/Bowel: Stomach, small bowel, appendix, and cecum are normal. Multiple diverticula of the descending colon and sigmoid colon without acute inflammation. Vascular/Lymphatic: Abdominal aorta is normal caliber with atherosclerotic calcification. There is no retroperitoneal or periportal lymphadenopathy. No pelvic lymphadenopathy. Reproductive: Prostate mildly enlarged Other: No peritoneal disease Musculoskeletal: Mixed lytic and sclerotic densities in the L2 vertebral body with thickened trabeculation favor Paget's disease or angioma. IMPRESSION: Chest Impression: 1. Stable to slight decrease in size of LEFT lower lobe pulmonary nodule following chemo radiation therapy.  This was hypermetabolic on comparison PET-CT scan. 2. No evidence of new or progressive disease. 3. The LEFT super clavicular no nodes is not imaged on current exam. Abdomen / Pelvis Impression: 1. The RIGHT adrenal gland measures slightly larger than comparison PET-CT scan at which time it was hypermetabolic. Consider follow-up FDG PET scan at some point. 2. No evidence skeletal metastasis. 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 Electronically Signed   By: Suzy Bouchard M.D.   On: 04/08/2019 15:40   Ct Abdomen Pelvis W Contrast  Result Date: 04/08/2019 CLINICAL DATA:  Lung cancer diagnosed August 2020. Radiation therapy and chemotherapy complete. EXAM: CT CHEST, ABDOMEN, AND PELVIS WITH CONTRAST TECHNIQUE: Multidetector CT imaging of the chest, abdomen and pelvis was performed following the standard protocol during bolus administration of intravenous contrast. CONTRAST:  139m OMNIPAQUE IOHEXOL 300 MG/ML  SOLN COMPARISON:  PET-CT 12/06/2018 FINDINGS: CT CHEST FINDINGS Cardiovascular: Coronary artery calcification and aortic atherosclerotic calcification. Mediastinum/Nodes: No  axillary or supraclavicular adenopathy. No mediastinal hilar adenopathy. Pericardial fluid. Esophagus normal. Lungs/Pleura: The LEFT upper lobe previously hypermetabolic nodule measures 13 mm x 8 mm compared with 14 mm x 9 mm for slight reduction in volume. No new pulmonary nodularity. Band of atelectasis in the LEFT lower lobe is similar Musculoskeletal: No aggressive osseous lesion. CT ABDOMEN AND PELVIS FINDINGS Hepatobiliary: Multiple benign hepatic cysts. Gallbladder normal. Pancreas: Pancreas is normal. No ductal dilatation. No pancreatic inflammation. Spleen: Normal spleen Adrenals/urinary tract: Enlarged RIGHT adrenal gland is not significantly changed measuring 18 mm 34 mm compared with 14 mm 36 mm. The lateral limb appears slightly more thickened measuring 14 mm (image 59/2) compared with 13 mm. This adrenal gland was hypermetabolic on comparison PET-CT scan. LEFT adrenal gland normal. Kidneys a benign-appearing cysts. Ureters and bladder normal Stomach/Bowel: Stomach, small bowel, appendix, and cecum are normal. Multiple diverticula of the descending colon and sigmoid colon without acute inflammation. Vascular/Lymphatic: Abdominal aorta is normal caliber with atherosclerotic calcification. There is no retroperitoneal or periportal lymphadenopathy. No pelvic lymphadenopathy. Reproductive: Prostate mildly enlarged Other: No peritoneal disease Musculoskeletal: Mixed lytic and sclerotic densities in the L2 vertebral body with thickened trabeculation favor Paget's disease or angioma. IMPRESSION: Chest Impression: 1. Stable to slight decrease in size of LEFT lower lobe pulmonary nodule following chemo radiation therapy. This was hypermetabolic on comparison PET-CT scan. 2. No evidence of new or progressive disease. 3. The LEFT super clavicular no nodes is not imaged on current exam. Abdomen / Pelvis Impression: 1. The RIGHT adrenal gland measures slightly larger than comparison PET-CT scan at which time it was  hypermetabolic. Consider follow-up FDG PET scan at some point. 2. No evidence skeletal metastasis. 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 Electronically Signed   By: SSuzy BouchardM.D.   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 The patient completed a course of concurrent chemoradiation to the chest with weekly carboplatin for AUC of 2 and paclitaxel 45 mg/M2 status post 7 cycles.  Last dose of chemotherapy was given on March 14, 2019. He also completed palliative radiotherapy to the right adrenal gland lesion under the care of Dr. MLisbeth Renshaw The patient had repeat CT scan of the chest, abdomen pelvis  performed recently.  I personally and independently reviewed the scans and discussed the results with the patient today. Has a scan showed improvement in his disease in the chest but there was some mild increase in the right adrenal gland lesion concerning for disease progression or reactive process to the recent radiation. I had a lengthy discussion with the patient today about his current condition and treatment options. The patient has for stage disease and I recommended for him treatment with first-line systemic chemotherapy with immunotherapy.  He was also given the option of palliative care and observation. The patient is interested in treatment and he will be on systemic therapy with carboplatin for AUC of 5, Alimta 500 mg/M2 and Keytruda 200 mg IV every 3 weeks. I discussed with the patient the adverse effect of this treatment including but not limited to alopecia, myelosuppression, nausea and vomiting, peripheral neuropathy, liver or renal dysfunction in addition to the immunotherapy  adverse effects. The patient is expected to start the first cycle of this treatment next week. I will arrange for the patient to receive vitamin B12 injection in few days and I also sent prescription of folic acid to his pharmacy. He will come back for follow-up visit in 1 week after his first cycle of the treatment for evaluation and management of any adverse effect of his treatment. The patient was advised to call immediately if he has any concerning symptoms in the interval. I discussed the assessment and treatment plan with the patient. The patient was provided an opportunity to ask questions and all were answered. The patient agreed with the plan and demonstrated an understanding of the instructions.   The patient was advised to call back or seek an in-person evaluation if the symptoms worsen or if the condition fails to improve as anticipated.  I provided 22 minutes of non face-to-face telephone visit time during this encounter, and > 50% was spent counseling as documented under my assessment & plan.  Eilleen Kempf, MD 04/11/2019 9:49 AM  Disclaimer: This note was dictated with voice recognition software. Similar sounding words can inadvertently be transcribed and may not be corrected upon review.

## 2019-04-11 NOTE — Progress Notes (Signed)
DISCONTINUE ON PATHWAY REGIMEN - Non-Small Cell Lung     Administer weekly:     Paclitaxel      Carboplatin   **Always confirm dose/schedule in your pharmacy ordering system**  REASON: Disease Progression PRIOR TREATMENT: [id number]: Carboplatin AUC=2 + Paclitaxel 45 mg/m2 Weekly During Radiation TREATMENT RESPONSE: Progressive Disease (PD)  START ON PATHWAY REGIMEN - Non-Small Cell Lung     A cycle is every 21 days:     Pembrolizumab      Pemetrexed      Carboplatin   **Always confirm dose/schedule in your pharmacy ordering system**  Patient Characteristics: Stage IV Metastatic, Nonsquamous, Initial Chemotherapy/Immunotherapy, PS = 0, 1, ALK Rearrangement Negative and EGFR Mutation Negative/Non-Sensitizing, PD-L1 Expression Positive 1-49% (TPS) / Negative / Not Tested / Awaiting Test Results and  Immunotherapy Candidate AJCC T Category: T1b Current Disease Status: Distant Metastases AJCC N Category: N3 AJCC M Category: M1c AJCC 8 Stage Grouping: IVB Histology: Nonsquamous Cell ROS1 Rearrangement Status: Negative T790M Mutation Status: Not Applicable - EGFR Mutation Negative/Unknown Other Mutations/Biomarkers: No Other Actionable Mutations Chemotherapy/Immunotherapy LOT: Initial Chemotherapy/Immunotherapy Molecular Targeted Therapy: Not Appropriate MET Exon 14 Mutation Status: Negative RET Gene Fusion Status: Negative EGFR Mutation Status: Negative/Wild Type NTRK Gene Fusion Status: Negative PD-L1 Expression Status: PD-L1 Positive 1-49% (TPS) ALK Rearrangement Status: Negative BRAF V600E Mutation Status: Negative ECOG Performance Status: 1 Immunotherapy Candidate Status: Candidate for Immunotherapy Intent of Therapy: Non-Curative / Palliative Intent, Discussed with Patient

## 2019-04-13 ENCOUNTER — Ambulatory Visit: Payer: Medicare Other

## 2019-04-13 ENCOUNTER — Telehealth: Payer: Self-pay | Admitting: Internal Medicine

## 2019-04-13 NOTE — Telephone Encounter (Signed)
Scheduled per los. Called and spoke with patient. Confirmed appts  

## 2019-04-15 ENCOUNTER — Other Ambulatory Visit: Payer: Self-pay

## 2019-04-15 ENCOUNTER — Inpatient Hospital Stay: Payer: Medicare Other

## 2019-04-15 ENCOUNTER — Other Ambulatory Visit: Payer: Self-pay | Admitting: Internal Medicine

## 2019-04-15 DIAGNOSIS — C3412 Malignant neoplasm of upper lobe, left bronchus or lung: Secondary | ICD-10-CM | POA: Diagnosis not present

## 2019-04-15 MED ORDER — CYANOCOBALAMIN 1000 MCG/ML IJ SOLN: 1000.0000 ug | Freq: Once | INTRAMUSCULAR | Status: DC

## 2019-04-15 MED ORDER — CYANOCOBALAMIN 1000 MCG/ML IJ SOLN
1000.0000 ug | Freq: Once | INTRAMUSCULAR | Status: AC
  Administered 2019-04-15: 1000 ug via INTRAMUSCULAR

## 2019-04-15 NOTE — Patient Instructions (Signed)

## 2019-04-18 ENCOUNTER — Telehealth: Payer: Self-pay | Admitting: Internal Medicine

## 2019-04-18 NOTE — Telephone Encounter (Signed)
Called and spoke with patient. Confirmed 12/1 appt

## 2019-04-19 ENCOUNTER — Inpatient Hospital Stay: Payer: Medicare Other | Attending: Internal Medicine

## 2019-04-19 ENCOUNTER — Other Ambulatory Visit: Payer: Self-pay

## 2019-04-19 ENCOUNTER — Telehealth: Payer: Self-pay

## 2019-04-19 VITALS — BP 119/72 | HR 75 | Temp 97.8°F | Resp 17 | Ht 69.0 in | Wt 199.2 lb

## 2019-04-19 DIAGNOSIS — C7971 Secondary malignant neoplasm of right adrenal gland: Secondary | ICD-10-CM | POA: Insufficient documentation

## 2019-04-19 DIAGNOSIS — I10 Essential (primary) hypertension: Secondary | ICD-10-CM | POA: Insufficient documentation

## 2019-04-19 DIAGNOSIS — C3412 Malignant neoplasm of upper lobe, left bronchus or lung: Secondary | ICD-10-CM | POA: Diagnosis present

## 2019-04-19 DIAGNOSIS — Z5111 Encounter for antineoplastic chemotherapy: Secondary | ICD-10-CM | POA: Insufficient documentation

## 2019-04-19 DIAGNOSIS — Z5189 Encounter for other specified aftercare: Secondary | ICD-10-CM | POA: Diagnosis not present

## 2019-04-19 DIAGNOSIS — R5383 Other fatigue: Secondary | ICD-10-CM | POA: Diagnosis not present

## 2019-04-19 DIAGNOSIS — Z79899 Other long term (current) drug therapy: Secondary | ICD-10-CM | POA: Diagnosis not present

## 2019-04-19 DIAGNOSIS — K5900 Constipation, unspecified: Secondary | ICD-10-CM | POA: Diagnosis not present

## 2019-04-19 DIAGNOSIS — E119 Type 2 diabetes mellitus without complications: Secondary | ICD-10-CM | POA: Diagnosis not present

## 2019-04-19 DIAGNOSIS — Z5112 Encounter for antineoplastic immunotherapy: Secondary | ICD-10-CM | POA: Insufficient documentation

## 2019-04-19 MED ORDER — PALONOSETRON HCL INJECTION 0.25 MG/5ML
0.2500 mg | Freq: Once | INTRAVENOUS | Status: AC
  Administered 2019-04-19: 15:00:00 0.25 mg via INTRAVENOUS

## 2019-04-19 MED ORDER — SODIUM CHLORIDE 0.9 % IV SOLN
Freq: Once | INTRAVENOUS | Status: AC
  Administered 2019-04-19: 15:00:00 via INTRAVENOUS
  Filled 2019-04-19: qty 5

## 2019-04-19 MED ORDER — SODIUM CHLORIDE 0.9 % IV SOLN
537.5000 mg | Freq: Once | INTRAVENOUS | Status: AC
  Administered 2019-04-19: 540 mg via INTRAVENOUS
  Filled 2019-04-19: qty 54

## 2019-04-19 MED ORDER — SODIUM CHLORIDE 0.9 % IV SOLN
200.0000 mg | Freq: Once | INTRAVENOUS | Status: AC
  Administered 2019-04-19: 200 mg via INTRAVENOUS
  Filled 2019-04-19: qty 8

## 2019-04-19 MED ORDER — SODIUM CHLORIDE 0.9 % IV SOLN
Freq: Once | INTRAVENOUS | Status: AC
  Administered 2019-04-19: 15:00:00 via INTRAVENOUS
  Filled 2019-04-19: qty 250

## 2019-04-19 MED ORDER — PALONOSETRON HCL INJECTION 0.25 MG/5ML
INTRAVENOUS | Status: AC
  Filled 2019-04-19: qty 5

## 2019-04-19 MED ORDER — SODIUM CHLORIDE 0.9 % IV SOLN
480.0000 mg/m2 | Freq: Once | INTRAVENOUS | Status: AC
  Administered 2019-04-19: 1000 mg via INTRAVENOUS
  Filled 2019-04-19: qty 40

## 2019-04-19 NOTE — Progress Notes (Signed)
Okay to proceed w/ treatment today per [facility], he will get baseline TSH prior to C2 of treatment.   Trevor Bailey, PharmD, [facility] Pharmacist Pharmacy Phone: [phone] 04/19/2019

## 2019-04-19 NOTE — Progress Notes (Signed)
  Radiation Oncology         [phone] ________________________________  Name: Trevor Bailey MRN: [id number]  Date: 03/15/2019  DOB: 1946-05-01  RESPIRATORY MOTION MANAGEMENT SIMULATION  NARRATIVE:  In order to account for effect of respiratory motion on target structures and other organs in the planning and delivery of radiotherapy, this patient underwent respiratory motion management simulation.  To accomplish this, when the patient was brought to the CT simulation planning suite, 4D respiratoy motion management CT images were obtained.  The CT images were loaded into the planning software.  Then, using a variety of tools including Cine, MIP, and standard views, the target volume and planning target volumes (PTV) were delineated.  Avoidance structures were contoured.  Treatment planning then occurred.  Dose volume histograms were generated and reviewed for each of the requested structure.  The resulting plan was carefully reviewed and approved today.   ------------------------------------------------  Jodelle Gross, MD, PhD

## 2019-04-19 NOTE — Patient Instructions (Signed)
[facility] Discharge Instructions for Patients Receiving Chemotherapy  Today you received the following chemotherapy agents: Pembrolizumab (Keytruda), Pemetrexed (Alimta), and Carboplatin (Paraplatin)  To help prevent nausea and vomiting after your treatment, we encourage you to take your nausea medication as directed.    If you develop nausea and vomiting that is not controlled by your nausea medication, call the clinic.   BELOW ARE SYMPTOMS THAT SHOULD BE REPORTED IMMEDIATELY:  *FEVER GREATER THAN 100.5 F  *CHILLS WITH OR WITHOUT FEVER  NAUSEA AND VOMITING THAT IS NOT CONTROLLED WITH YOUR NAUSEA MEDICATION  *UNUSUAL SHORTNESS OF BREATH  *UNUSUAL BRUISING OR BLEEDING  TENDERNESS IN MOUTH AND THROAT WITH OR WITHOUT PRESENCE OF ULCERS  *URINARY PROBLEMS  *BOWEL PROBLEMS  UNUSUAL RASH Items with * indicate a potential emergency and should be followed up as soon as possible.  Feel free to call the clinic should you have any questions or concerns. The clinic phone number is [phone].  Please show the [facility] at check-in to the Emergency Department and triage nurse.  Pembrolizumab injection What is this medicine? PEMBROLIZUMAB (pem broe liz ue mab) is a monoclonal antibody. It is used to treat bladder cancer, cervical cancer, endometrial cancer, esophageal cancer, head and neck cancer, hepatocellular cancer, Hodgkin lymphoma, kidney cancer, lymphoma, melanoma, Merkel cell carcinoma, lung cancer, stomach cancer, urothelial cancer, and cancers that have a certain genetic condition. This medicine may be used for other purposes; ask your health care provider or pharmacist if you have questions. COMMON BRAND NAME(S): Keytruda What should I tell my health care provider before I take this medicine? They need to know if you have any of these conditions:  diabetes  immune system problems  inflammatory bowel disease  liver disease  lung or breathing  disease  lupus  received or scheduled to receive an organ transplant or a stem-cell transplant that uses donor stem cells  an unusual or allergic reaction to pembrolizumab, other medicines, foods, dyes, or preservatives  pregnant or trying to get pregnant  breast-feeding How should I use this medicine? This medicine is for infusion into a vein. It is given by a health care professional in a hospital or clinic setting. A special MedGuide will be given to you before each treatment. Be sure to read this information carefully each time. Talk to your pediatrician regarding the use of this medicine in children. While this drug may be prescribed for selected conditions, precautions do apply. Overdosage: If you think you have taken too much of this medicine contact a poison control center or emergency room at once. NOTE: This medicine is only for you. Do not share this medicine with others. What if I miss a dose? It is important not to miss your dose. Call your doctor or health care professional if you are unable to keep an appointment. What may interact with this medicine? Interactions have not been studied. Give your health care provider a list of all the medicines, herbs, non-prescription drugs, or dietary supplements you use. Also tell them if you smoke, drink alcohol, or use illegal drugs. Some items may interact with your medicine. This list may not describe all possible interactions. Give your health care provider a list of all the medicines, herbs, non-prescription drugs, or dietary supplements you use. Also tell them if you smoke, drink alcohol, or use illegal drugs. Some items may interact with your medicine. What should I watch for while using this medicine? Your condition will be monitored carefully while you  are receiving this medicine. You may need blood work done while you are taking this medicine. Do not become pregnant while taking this medicine or for 4 months after stopping it.  Women should inform their doctor if they wish to become pregnant or think they might be pregnant. There is a potential for serious side effects to an unborn child. Talk to your health care professional or pharmacist for more information. Do not breast-feed an infant while taking this medicine or for 4 months after the last dose. What side effects may I notice from receiving this medicine? Side effects that you should report to your doctor or health care professional as soon as possible:  allergic reactions like skin rash, itching or hives, swelling of the face, lips, or tongue  bloody or black, tarry  breathing problems  changes in vision  chest pain  chills  confusion  constipation  cough  diarrhea  dizziness or feeling faint or lightheaded  fast or irregular heartbeat  fever  flushing  hair loss  joint pain  low blood counts - this medicine may decrease the number of white blood cells, red blood cells and platelets. You may be at increased risk for infections and bleeding.  muscle pain  muscle weakness  persistent headache  redness, blistering, peeling or loosening of the skin, including inside the mouth  signs and symptoms of high blood sugar such as dizziness; dry mouth; dry skin; fruity breath; nausea; stomach pain; increased hunger or thirst; increased urination  signs and symptoms of kidney injury like trouble passing urine or change in the amount of urine  signs and symptoms of liver injury like dark urine, light-colored stools, loss of appetite, nausea, right upper belly pain, yellowing of the eyes or skin  sweating  swollen lymph nodes  weight loss Side effects that usually do not require medical attention (report to your doctor or health care professional if they continue or are bothersome):  decreased appetite  muscle pain  tiredness This list may not describe all possible side effects. Call your doctor for medical advice about side effects.  You may report side effects to FDA at 1-800-FDA-1088. Where should I keep my medicine? This drug is given in a hospital or clinic and will not be stored at home. NOTE: This sheet is a summary. It may not cover all possible information. If you have questions about this medicine, talk to your doctor, pharmacist, or health care provider.  2020 Elsevier/Gold Standard (2018-06-01 13:46:58)  Pemetrexed injection What is this medicine? PEMETREXED (PEM e TREX ed) is a chemotherapy drug used to treat lung cancers like non-small cell lung cancer and mesothelioma. It may also be used to treat other cancers. This medicine may be used for other purposes; ask your health care provider or pharmacist if you have questions. COMMON BRAND NAME(S): Alimta What should I tell my health care provider before I take this medicine? They need to know if you have any of these conditions:  infection (especially a virus infection such as chickenpox, cold sores, or herpes)  kidney disease  low blood counts, like low white cell, platelet, or red cell counts  lung or breathing disease, like asthma  radiation therapy  an unusual or allergic reaction to pemetrexed, other medicines, foods, dyes, or preservative  pregnant or trying to get pregnant  breast-feeding How should I use this medicine? This drug is given as an infusion into a vein. It is administered in a hospital or clinic by a specially trained  health care professional. Talk to your pediatrician regarding the use of this medicine in children. Special care may be needed. Overdosage: If you think you have taken too much of this medicine contact a poison control center or emergency room at once. NOTE: This medicine is only for you. Do not share this medicine with others. What if I miss a dose? It is important not to miss your dose. Call your doctor or health care professional if you are unable to keep an appointment. What may interact with this  medicine? This medicine may interact with the following medications:  Ibuprofen This list may not describe all possible interactions. Give your health care provider a list of all the medicines, herbs, non-prescription drugs, or dietary supplements you use. Also tell them if you smoke, drink alcohol, or use illegal drugs. Some items may interact with your medicine. What should I watch for while using this medicine? Visit your doctor for checks on your progress. This drug may make you feel generally unwell. This is not uncommon, as chemotherapy can affect healthy cells as well as cancer cells. Report any side effects. Continue your course of treatment even though you feel ill unless your doctor tells you to stop. In some cases, you may be given additional medicines to help with side effects. Follow all directions for their use. Call your doctor or health care professional for advice if you get a fever, chills or sore throat, or other symptoms of a cold or flu. Do not treat yourself. This drug decreases your body's ability to fight infections. Try to avoid being around people who are sick. This medicine may increase your risk to bruise or bleed. Call your doctor or health care professional if you notice any unusual bleeding. Be careful brushing and flossing your teeth or using a toothpick because you may get an infection or bleed more easily. If you have any dental work done, tell your dentist you are receiving this medicine. Avoid taking products that contain aspirin, acetaminophen, ibuprofen, naproxen, or ketoprofen unless instructed by your doctor. These medicines may hide a fever. Call your doctor or health care professional if you get diarrhea or mouth sores. Do not treat yourself. To protect your kidneys, drink water or other fluids as directed while you are taking this medicine. Do not become pregnant while taking this medicine or for 6 months after stopping it. Women should inform their doctor if  they wish to become pregnant or think they might be pregnant. Men should not father a child while taking this medicine and for 3 months after stopping it. This may interfere with the ability to father a child. You should talk to your doctor or health care professional if you are concerned about your fertility. There is a potential for serious side effects to an unborn child. Talk to your health care professional or pharmacist for more information. Do not breast-feed an infant while taking this medicine or for 1 week after stopping it. What side effects may I notice from receiving this medicine? Side effects that you should report to your doctor or health care professional as soon as possible:  allergic reactions like skin rash, itching or hives, swelling of the face, lips, or tongue  breathing problems  redness, blistering, peeling or loosening of the skin, including inside the mouth  signs and symptoms of bleeding such as bloody or black, tarry stools; red or dark-brown urine; spitting up blood or brown material that looks like coffee grounds; red spots on the  skin; unusual bruising or bleeding from the eye, gums, or nose  signs and symptoms of infection like fever or chills; cough; sore throat; pain or trouble passing urine  signs and symptoms of kidney injury like trouble passing urine or change in the amount of urine  signs and symptoms of liver injury like dark yellow or brown urine; general ill feeling or flu-like symptoms; light-colored stools; loss of appetite; nausea; right upper belly pain; unusually weak or tired; yellowing of the eyes or skin Side effects that usually do not require medical attention (report to your doctor or health care professional if they continue or are bothersome):  constipation  mouth sores  nausea, vomiting  unusually weak or tired This list may not describe all possible side effects. Call your doctor for medical advice about side effects. You may report  side effects to FDA at 1-800-FDA-1088. Where should I keep my medicine? This drug is given in a hospital or clinic and will not be stored at home. NOTE: This sheet is a summary. It may not cover all possible information. If you have questions about this medicine, talk to your doctor, pharmacist, or health care provider.  2020 Elsevier/Gold Standard (2017-06-24 16:11:33)  Coronavirus (COVID-19) Are you at risk?  Are you at risk for the Coronavirus (COVID-19)?  To be considered HIGH RISK for Coronavirus (COVID-19), you have to meet the following criteria:  . Traveled to [place], [place], [place], [place] or [place]; or in the [place] to [place], [place], [place], or [place]; and have fever, cough, and shortness of breath within the last 2 weeks of travel OR . Been in close contact with a person diagnosed with COVID-19 within the last 2 weeks and have fever, cough, and shortness of breath . IF YOU DO NOT MEET THESE CRITERIA, YOU ARE CONSIDERED LOW RISK FOR COVID-19.  What to do if you are HIGH RISK for COVID-19?  Marland Kitchen If you are having a medical emergency, call 911. . Seek medical care right away. Before you go to a doctor's office, urgent care or emergency department, call ahead and tell them about your recent travel, contact with someone diagnosed with COVID-19, and your symptoms. You should receive instructions from your physician's office regarding next steps of care.  . When you arrive at healthcare provider, tell the healthcare staff immediately you have returned from visiting [place], [place], [place], [place] or [place]; or traveled in the [place] to [place], [place], [place], or [place]; in the last two weeks or you have been in close contact with a person diagnosed with COVID-19 in the last 2 weeks.   . Tell the health care staff about your symptoms: fever, cough and shortness of breath. . After you have been seen by a medical provider, you will be either: o Tested  for (COVID-19) and discharged home on quarantine except to seek medical care if symptoms worsen, and asked to  - Stay home and avoid contact with others until you get your results (4-5 days)  - Avoid travel on public transportation if possible (such as bus, train, or airplane) or o Sent to the Emergency Department by EMS for evaluation, COVID-19 testing, and possible admission depending on your condition and test results.  What to do if you are LOW RISK for COVID-19?  Reduce your risk of any infection by using the same precautions used for avoiding the common cold or flu:  Marland Kitchen Wash your hands often with soap and warm  water for at least 20 seconds.  If soap and water are not readily available, use an alcohol-based hand sanitizer with at least 60% alcohol.  . If coughing or sneezing, cover your mouth and nose by coughing or sneezing into the elbow areas of your shirt or coat, into a tissue or into your sleeve (not your hands). . Avoid shaking hands with others and consider head nods or verbal greetings only. . Avoid touching your eyes, nose, or mouth with unwashed hands.  . Avoid close contact with people who are sick. . Avoid places or events with large numbers of people in one location, like concerts or sporting events. . Carefully consider travel plans you have or are making. . If you are planning any travel outside or inside the [place], visit the CDC's Travelers' Health webpage for the latest health notices. . If you have some symptoms but not all symptoms, continue to monitor at home and seek medical attention if your symptoms worsen. . If you are having a medical emergency, call 911.   [place] / e-Visit: [email or web address]         [facility]: [facility]: [phone]                   [facility]: [phone]

## 2019-04-19 NOTE — Telephone Encounter (Signed)
Per DR. Mohammed, Ok to treat to day with lab results from 04/08/2019

## 2019-04-19 NOTE — Progress Notes (Signed)
[facility] Simulation and Treatment Planning Note   Name:  Trevor Bailey MRN: [id number]   Date: 03/15/2019  DOB: 1945-06-28  Status:outpatient    DIAGNOSIS:    ICD-10-CM   1. 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  Identity was confirmed.  All relevant records and images related to the planned course of therapy were reviewed.  Then, the patient was positioned in a stable reproducible clinical set-up for radiation therapy. Abdominal compression was applied by me.  4D CT images were obtained and reproducible breathing pattern was confirmed. Free breathing CT images were obtained.  Skin markings were placed.  The CT images were loaded into the planning software where the target and avoidance structures were contoured.  The radiation prescription was entered and confirmed.    TREATMENT PLANNING NOTE:  Treatment planning then occurred. I have requested : IMRT planning.This treatment technique is medically necessary due to the high-dose of radiation delivered to the target region which is in close proximity to adjacent critical normal structures.  3 dimensional simulation is performed and dose volume histogram of the gross tumor volume, planning tumor volume and criticial normal structures including the spinal cord and lungs were analyzed and requested.  Special treatment procedure was performed due to high dose per fraction.  The patient will be monitored for increased risk of toxicity.  Daily imaging using cone beam CT will be used for target localization.  I anticipate that the patient will receive 50 Gy in 5 fractions to target  volume. Further adjustments will be made based on the planning process is necessary.  ------------------------------------------------  Jodelle Gross, MD, PhD

## 2019-04-20 ENCOUNTER — Telehealth: Payer: Self-pay | Admitting: *Deleted

## 2019-04-27 ENCOUNTER — Inpatient Hospital Stay: Payer: Medicare Other | Admitting: Physician Assistant

## 2019-04-27 ENCOUNTER — Inpatient Hospital Stay: Payer: Medicare Other

## 2019-04-27 ENCOUNTER — Telehealth: Payer: Self-pay | Admitting: *Deleted

## 2019-04-27 ENCOUNTER — Other Ambulatory Visit: Payer: Self-pay

## 2019-04-27 VITALS — BP 143/81 | HR 84 | Temp 97.4°F | Resp 16 | Ht 69.0 in | Wt 196.9 lb

## 2019-04-27 DIAGNOSIS — C7971 Secondary malignant neoplasm of right adrenal gland: Secondary | ICD-10-CM | POA: Diagnosis not present

## 2019-04-27 DIAGNOSIS — C3412 Malignant neoplasm of upper lobe, left bronchus or lung: Secondary | ICD-10-CM

## 2019-04-27 DIAGNOSIS — Z5112 Encounter for antineoplastic immunotherapy: Secondary | ICD-10-CM | POA: Diagnosis not present

## 2019-04-27 LAB — CBC WITH DIFFERENTIAL (CANCER CENTER ONLY)
Abs Immature Granulocytes: 0.01 10*3/uL (ref 0.00–0.07)
Basophils Absolute: 0 10*3/uL (ref 0.0–0.1)
Basophils Relative: 1 %
Eosinophils Absolute: 0 10*3/uL (ref 0.0–0.5)
Eosinophils Relative: 2 %
HCT: 33.9 % — ABNORMAL LOW (ref 39.0–52.0)
Hemoglobin: 11.4 g/dL — ABNORMAL LOW (ref 13.0–17.0)
Immature Granulocytes: 1 %
Lymphocytes Relative: 22 %
Lymphs Abs: 0.3 10*3/uL — ABNORMAL LOW (ref 0.7–4.0)
MCH: 32.5 pg (ref 26.0–34.0)
MCHC: 33.6 g/dL (ref 30.0–36.0)
MCV: 96.6 fL (ref 80.0–100.0)
Monocytes Absolute: 0.2 10*3/uL (ref 0.1–1.0)
Monocytes Relative: 18 %
Neutro Abs: 0.8 10*3/uL — ABNORMAL LOW (ref 1.7–7.7)
Neutrophils Relative %: 56 %
Platelet Count: 159 10*3/uL (ref 150–400)
RBC: 3.51 MIL/uL — ABNORMAL LOW (ref 4.22–5.81)
RDW: 13.4 % (ref 11.5–15.5)
WBC Count: 1.3 10*3/uL — ABNORMAL LOW (ref 4.0–10.5)
nRBC: 0 % (ref 0.0–0.2)

## 2019-04-27 LAB — CMP (CANCER CENTER ONLY)
ALT: 22 U/L (ref 0–44)
AST: 19 U/L (ref 15–41)
Albumin: 3.6 g/dL (ref 3.5–5.0)
Alkaline Phosphatase: 88 U/L (ref 38–126)
Anion gap: 10 (ref 5–15)
BUN: 16 mg/dL (ref 8–23)
CO2: 25 mmol/L (ref 22–32)
Calcium: 9.6 mg/dL (ref 8.9–10.3)
Chloride: 108 mmol/L (ref 98–111)
Creatinine: 0.82 mg/dL (ref 0.61–1.24)
GFR, Est AFR Am: >60 mL/min (ref >60)
GFR, Estimated: >60 mL/min (ref >60)
Glucose, Bld: 131 mg/dL — ABNORMAL HIGH (ref 70–99)
Potassium: 4.1 mmol/L (ref 3.5–5.1)
Sodium: 143 mmol/L (ref 135–145)
Total Bilirubin: 0.2 mg/dL — ABNORMAL LOW (ref 0.3–1.2)
Total Protein: 7.1 g/dL (ref 6.5–8.1)

## 2019-04-27 LAB — TSH: TSH: 0.376 u[IU]/mL (ref 0.320–4.118)

## 2019-04-27 NOTE — Progress Notes (Signed)
[facility] OFFICE PROGRESS NOTE  [name], MD [address] [place] [address]  DIAGNOSIS: Stage IV (T1b, N3, M1c) non-small cell lung cancer, adenocarcinoma. 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  Biomarker Findings Tumor Mutational Burden - 10 Muts/Mb Microsatellite status - MS-Stable Genomic Findings For a complete list of the genes assayed, please refer to the Appendix. ERBB2 amplification - equivocal? LID03 U13* FANCC splice site 143+8O>I SMAD4 Q289* TP53 E298* 7 Disease relevant genes with no reportable alterations: ALK, BRAF, EGFR, KRAS, MET, RET, ROS1   PDL1 expression is 30%.  PRIOR THERAPY: 1) Weekly concurrent chemoradiation with carboplatin for an AUC of 2 and paclitaxel 45 mg/m2 to the locally advanced disease in the chest. He started the first dose 01/31/2019. Status post 7cycles.  Last dose of chemotherapy was given on March 14, 2019. 2) palliative radiotherapy to the metastatic disease in the right adrenal gland.  CURRENT THERAPY: Systemic chemotherapy with carboplatin for AUC of 5, Alimta 500 mg/M2 and Keytruda 200 mg IV every 3 weeks.  First dose April 20, 2019. Status post 1 cycle.   INTERVAL HISTORY: Trevor Bailey 73 y.o. male returns to the clinic for a follow-up visit.  The patient was recently started on treatment with systemic chemotherapy with carboplatin for an AUC of 5, Alimta 500 mg/m, and Keytruda 200 mg IV every 3 weeks.  He is here for 1 week follow-up visit after completing his first cycle.  He tolerated it well without any concerning adverse side effects except for some fatigue approximately a day and a half following his treatment.  Denies any recent fever, chills, night sweats, or weight loss.  He denies any chest pain, shortness of  breath, cough, or hemoptysis.  He denies any nausea, vomiting, or diarrhea.  He occasionally experiences constipation which he states is controlled with a laxative.  He has been experiencing some taste alterations since starting chemotherapy.  He also has a skin burn secondary to his radiation treatment on his left shoulder for which his radiation oncologist prescribed him cream. The radiation burn is reportedly improving, but the patient occasionally experiences bleeding in this region if it is rubbed/irritated while showing. He denies any other rashes or skin changes. He denies any headache or visual changes. He is here today for evaluation and a 1 week follow-up visit after completing his first cycle of chemotherapy.  MEDICAL HISTORY: Past Medical History:  Diagnosis Date  . Adenopathy    left supraclavicular lymph node  . Allergies   . COPD (chronic obstructive pulmonary disease) ([place])   . Coronary disease   . Diabetes ([place])   . Enlarged prostate   . GERD (gastroesophageal reflux disease)   . Glaucoma   . Hyperlipidemia   . Hypertension   . Myocardial infarction ([place])   . Sleep apnea    wears CPAP  . Wears partial dentures    upper and lower    ALLERGIES:  has No Known Allergies.  MEDICATIONS:  Current Outpatient Medications  Medication Sig Dispense Refill  . amLODipine (NORVASC) 5 MG tablet Take 1 tablet (5 mg total) by mouth daily. 90 tablet 3  . aspirin EC 81 MG tablet Take 81 mg by mouth daily.    Marland Kitchen atorvastatin (LIPITOR) 80 MG tablet Take 1 tablet (80 mg total) by mouth daily. 90 tablet  3  . augmented betamethasone dipropionate (DIPROLENE-AF) 0.05 % cream     . CLARITIN 10 MG tablet Take 10 mg by mouth daily.     . clopidogrel (PLAVIX) 75 MG tablet Take 1 tablet (75 mg total) by mouth every evening. 30 tablet 0  . finasteride (PROSCAR) 5 MG tablet Take 5 mg by mouth daily.     . fluconazole (DIFLUCAN) 100 MG tablet Take 1 tablet (100 mg total) by mouth daily. 7 tablet 0   . folic acid (FOLVITE) 1 MG tablet Take 1 tablet (1 mg total) by mouth daily. 30 tablet 4  . hydrochlorothiazide (HYDRODIURIL) 25 MG tablet Take 25 mg by mouth daily.     Marland Kitchen latanoprost (XALATAN) 0.005 % ophthalmic solution Place 1 drop into both eyes at bedtime.     . metFORMIN (GLUCOPHAGE) 1000 MG tablet Take 1,000 mg by mouth 2 (two) times daily.     . metoprolol tartrate (LOPRESSOR) 25 MG tablet Take 25 mg 2 (two) times daily by mouth.    . Multiple Vitamin (MULTIVITAMIN WITH MINERALS) TABS tablet Take 1 tablet by mouth daily. Centrum Silver    . Multiple Vitamins-Minerals (PRESERVISION AREDS 2 PO) Take 1 tablet by mouth 2 (two) times daily.    . nitroGLYCERIN (NITROSTAT) 0.4 MG SL tablet Place 1 tablet (0.4 mg total) under the tongue every 5 (five) minutes as needed for chest pain. (Patient not taking: Reported on 02/25/2019) 25 tablet 6  . pantoprazole (PROTONIX) 40 MG tablet Take 40 mg daily by mouth.    Vladimir Faster Glycol-Propyl Glycol (SYSTANE) 0.4-0.3 % SOLN Place 1-2 drops into both eyes 3 (three) times daily as needed (dry/irritated eyes.).    Marland Kitchen prochlorperazine (COMPAZINE) 10 MG tablet Take 1 tablet (10 mg total) by mouth every 6 (six) hours as needed for nausea or vomiting. 30 tablet 2  . ramipril (ALTACE) 10 MG capsule Take 10 mg by mouth 2 (two) times daily.     . sucralfate (CARAFATE) 1 g tablet Take 1 tablet (1 g total) by mouth 4 (four) times daily -  with meals and at bedtime. Crush tablet and mix in 1 oz water and take po qAC and HS 120 tablet 1  . traMADol (ULTRAM) 50 MG tablet Take 1 tablet (50 mg total) by mouth every 6 (six) hours as needed. 20 tablet 0  . VENTOLIN HFA 108 (90 Base) MCG/ACT inhaler Inhale 1-2 puffs into the lungs every 6 (six) hours as needed for wheezing or shortness of breath.   0  . ZETIA 10 MG tablet Take 10 mg by mouth every evening.      No current facility-administered medications for this visit.     SURGICAL HISTORY:  Past Surgical History:   Procedure Laterality Date  . ABDOMINAL AORTAGRAM Left 09/16/2013   Procedure: ABDOMINAL Maxcine Ham;  Surgeon: Elam Dutch, MD;  Location: [facility] CATH LAB;  Service: Cardiovascular;  Laterality: Left;  . CARDIAC CATHETERIZATION  01/2011   When there was segmental stenosis of the distal RCA, patent PCA stent, patent circumflex stent, and a patent small diagonal with 90% ISR.   Marland Kitchen COLONOSCOPY W/ BIOPSIES AND POLYPECTOMY    . CORONARY ANGIOPLASTY  may 2002   Non-DES stenting of his circumflex, non-DES stenting of the RCA  . HYDROCELE EXCISION / REPAIR  2009   by Dr Janice Norrie  . INGUINAL HERNIA REPAIR Bilateral    Dr Bubba Camp  . LYMPH NODE BIOPSY Left 01/10/2019   Procedure: left supraclavicular LYMPH NODE  BIOPSY;  Surgeon: [name], MD;  Location: [facility];  Service: Thoracic;  Laterality: Left;  Marland Kitchen MULTIPLE TOOTH EXTRACTIONS    . NM MYOCAR PERF WALL MOTION  01/08/2011   moderate in size and intensity area of reversible ischemia in the basal to mid inferior and septal territories. Abnormal study  . stents  2008   proximal RCA, DES for progession of disease.  Marland Kitchen US ECHOCARDIOGRAPHY  01/12/2012   mild concentric LVH, borderline LA enlargement, mild to mod TR    REVIEW OF SYSTEMS:   Review of Systems  Constitutional: Positive for fatigue (improved). Negative for appetite change, chills, fever and unexpected weight change.  HENT: Negative for mouth sores, nosebleeds, sore throat and trouble swallowing.   Eyes: Negative for eye problems and icterus.  Respiratory: Negative for cough, hemoptysis, shortness of breath and wheezing.   Cardiovascular: Negative for chest pain and leg swelling.  Gastrointestinal: Positive for occasional constipation. Negative for abdominal pain, diarrhea, nausea and vomiting.  Genitourinary: Negative for bladder incontinence, difficulty urinating, dysuria, frequency and hematuria.   Musculoskeletal: Negative for back pain, gait problem, neck pain and neck stiffness.   Skin: Negative for itching and rash.  Neurological: Negative for dizziness, extremity weakness, gait problem, headaches, light-headedness and seizures.  Hematological: Negative for adenopathy. Does not bruise/bleed easily.  Psychiatric/Behavioral: Negative for confusion, depression and sleep disturbance. The patient is not nervous/anxious.     PHYSICAL EXAMINATION:  Blood pressure (!) 143/81, pulse 84, temperature (!) 97.4 F (36.3 C), temperature source Temporal, resp. rate 16, height '5\' 9"'$  (1.753 m), weight 196 lb 14.4 oz (89.3 kg), SpO2 100 %.  ECOG PERFORMANCE STATUS: 1 - Symptomatic but completely ambulatory  Physical Exam  Constitutional: Oriented to person, place, and time and well-developed, well-nourished, and in no distress.  HENT:  Head: Normocephalic and atraumatic.  Mouth/Throat: Oropharynx is clear and moist. No oropharyngeal exudate.  Eyes: Conjunctivae are normal. Right eye exhibits no discharge. Left eye exhibits no discharge. No scleral icterus.  Neck: Normal range of motion. Neck supple.  Cardiovascular: Normal rate, regular rhythm, normal heart sounds and intact distal pulses.   Pulmonary/Chest: Effort normal and breath sounds normal. No respiratory distress. No wheezes. No rales.  Abdominal: Soft. Bowel sounds are normal. Exhibits no distension and no mass. There is no tenderness.  Musculoskeletal: Normal range of motion. Exhibits no edema.  Lymphadenopathy:    No cervical adenopathy.  Neurological: Alert and oriented to person, place, and time. Exhibits normal muscle tone. Gait normal. Coordination normal.  Skin: Well healing radiation burn over left shoulder-no bleeding, pus, or drainage. Skin is warm and dry. No rash noted. Not diaphoretic. No erythema. No pallor.  Psychiatric: Mood, memory and judgment normal.  Vitals reviewed.  LABORATORY DATA: Lab Results  Component Value Date   WBC 1.3 (L) 04/27/2019   HGB 11.4 (L) 04/27/2019   HCT 33.9 (L) 04/27/2019    MCV 96.6 04/27/2019   PLT 159 04/27/2019      Chemistry      Component Value Date/Time   NA 143 04/27/2019 1228   K 4.1 04/27/2019 1228   CL 108 04/27/2019 1228   CO2 25 04/27/2019 1228   BUN 16 04/27/2019 1228   CREATININE 0.82 04/27/2019 1228   CREATININE 0.94 05/16/2014 1109      Component Value Date/Time   CALCIUM 9.6 04/27/2019 1228   ALKPHOS 88 04/27/2019 1228   AST 19 04/27/2019 1228   ALT 22 04/27/2019 1228   BILITOT 0.2 (L)  04/27/2019 1228       RADIOGRAPHIC STUDIES:  Ct Chest W Contrast  Result Date: 04/08/2019 CLINICAL DATA:  Lung cancer diagnosed August 2020. Radiation therapy and chemotherapy complete. EXAM: CT CHEST, ABDOMEN, AND PELVIS WITH CONTRAST TECHNIQUE: Multidetector CT imaging of the chest, abdomen and pelvis was performed following the standard protocol during bolus administration of intravenous contrast. CONTRAST:  131m OMNIPAQUE IOHEXOL 300 MG/ML  SOLN COMPARISON:  PET-CT 12/06/2018 FINDINGS: CT CHEST FINDINGS Cardiovascular: Coronary artery calcification and aortic atherosclerotic calcification. Mediastinum/Nodes: No axillary or supraclavicular adenopathy. No mediastinal hilar adenopathy. Pericardial fluid. Esophagus normal. Lungs/Pleura: The LEFT upper lobe previously hypermetabolic nodule measures 13 mm x 8 mm compared with 14 mm x 9 mm for slight reduction in volume. No new pulmonary nodularity. Band of atelectasis in the LEFT lower lobe is similar Musculoskeletal: No aggressive osseous lesion. CT ABDOMEN AND PELVIS FINDINGS Hepatobiliary: Multiple benign hepatic cysts. Gallbladder normal. Pancreas: Pancreas is normal. No ductal dilatation. No pancreatic inflammation. Spleen: Normal spleen Adrenals/urinary tract: Enlarged RIGHT adrenal gland is not significantly changed measuring 18 mm 34 mm compared with 14 mm 36 mm. The lateral limb appears slightly more thickened measuring 14 mm (image 59/2) compared with 13 mm. This adrenal gland was hypermetabolic  on comparison PET-CT scan. LEFT adrenal gland normal. Kidneys a benign-appearing cysts. Ureters and bladder normal Stomach/Bowel: Stomach, small bowel, appendix, and cecum are normal. Multiple diverticula of the descending colon and sigmoid colon without acute inflammation. Vascular/Lymphatic: Abdominal aorta is normal caliber with atherosclerotic calcification. There is no retroperitoneal or periportal lymphadenopathy. No pelvic lymphadenopathy. Reproductive: Prostate mildly enlarged Other: No peritoneal disease Musculoskeletal: Mixed lytic and sclerotic densities in the L2 vertebral body with thickened trabeculation favor Paget's disease or angioma. IMPRESSION: Chest Impression: 1. Stable to slight decrease in size of LEFT lower lobe pulmonary nodule following chemo radiation therapy. This was hypermetabolic on comparison PET-CT scan. 2. No evidence of new or progressive disease. 3. The LEFT super clavicular no nodes is not imaged on current exam. Abdomen / Pelvis Impression: 1. The RIGHT adrenal gland measures slightly larger than comparison PET-CT scan at which time it was hypermetabolic. Consider follow-up FDG PET scan at some point. 2. No evidence skeletal metastasis. 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 Electronically Signed   By: SSuzy BouchardM.D.   On: 04/08/2019 15:40   Ct Abdomen Pelvis W Contrast  Result Date: 04/08/2019 CLINICAL DATA:  Lung cancer diagnosed August 2020. Radiation therapy and chemotherapy complete. EXAM: CT CHEST, ABDOMEN, AND PELVIS WITH CONTRAST TECHNIQUE: Multidetector CT imaging of the chest, abdomen and pelvis was performed following the standard protocol during bolus administration of intravenous contrast. CONTRAST:  1060mOMNIPAQUE IOHEXOL 300 MG/ML  SOLN COMPARISON:  PET-CT 12/06/2018 FINDINGS: CT CHEST FINDINGS Cardiovascular: Coronary artery calcification and aortic atherosclerotic calcification. Mediastinum/Nodes: No axillary or  supraclavicular adenopathy. No mediastinal hilar adenopathy. Pericardial fluid. Esophagus normal. Lungs/Pleura: The LEFT upper lobe previously hypermetabolic nodule measures 13 mm x 8 mm compared with 14 mm x 9 mm for slight reduction in volume. No new pulmonary nodularity. Band of atelectasis in the LEFT lower lobe is similar Musculoskeletal: No aggressive osseous lesion. CT ABDOMEN AND PELVIS FINDINGS Hepatobiliary: Multiple benign hepatic cysts. Gallbladder normal. Pancreas: Pancreas is normal. No ductal dilatation. No pancreatic inflammation. Spleen: Normal spleen Adrenals/urinary tract: Enlarged RIGHT adrenal gland is not significantly changed measuring 18 mm 34 mm compared with 14 mm 36 mm. The lateral limb appears slightly more thickened measuring 14 mm (image  59/2) compared with 13 mm. This adrenal gland was hypermetabolic on comparison PET-CT scan. LEFT adrenal gland normal. Kidneys a benign-appearing cysts. Ureters and bladder normal Stomach/Bowel: Stomach, small bowel, appendix, and cecum are normal. Multiple diverticula of the descending colon and sigmoid colon without acute inflammation. Vascular/Lymphatic: Abdominal aorta is normal caliber with atherosclerotic calcification. There is no retroperitoneal or periportal lymphadenopathy. No pelvic lymphadenopathy. Reproductive: Prostate mildly enlarged Other: No peritoneal disease Musculoskeletal: Mixed lytic and sclerotic densities in the L2 vertebral body with thickened trabeculation favor Paget's disease or angioma. IMPRESSION: Chest Impression: 1. Stable to slight decrease in size of LEFT lower lobe pulmonary nodule following chemo radiation therapy. This was hypermetabolic on comparison PET-CT scan. 2. No evidence of new or progressive disease. 3. The LEFT super clavicular no nodes is not imaged on current exam. Abdomen / Pelvis Impression: 1. The RIGHT adrenal gland measures slightly larger than comparison PET-CT scan at which time it was  hypermetabolic. Consider follow-up FDG PET scan at some point. 2. No evidence skeletal metastasis. 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 Electronically Signed   By: Suzy Bouchard M.D.   On: 04/08/2019 15:40     ASSESSMENT/PLAN:  This is a very pleasant 73 years old African-American male with stage IV non-small cell lung cancer, adenocarcinoma with no actionable mutation and PD-L1 expression of 30%,presented with locally advanced disease in the chest as well as solitary metastasis to the right adrenal gland. He has no actionable mutations.   The patient completed a course of concurrent chemoradiation to the chest with weekly carboplatin for AUC of 2 and paclitaxel 45 mg/M2 status post7cycles.  Last dose of chemotherapy was given on March 14, 2019. He also completed palliative radiotherapy to the right adrenal gland lesion under the care of Dr. [name].  He is currently undergoing systemic chemotherapy with carboplatin for an AUC of 5, Alimta 500 mg per metered squared and Keytruda 200 mg IV every 3 weeks.  He is status post 1 cycle.  He tolerated treatment well without any concerning adverse side effects except for mild fatigue.  Labs were reviewed. His ANC is a little low today. We will continue to monitor his labs closely every week. The patient was called to review labs and neutropenic precautions were discussed.   We will see him back for a follow up visit in 2 weeks for evaluation before starting cycle #2.   The patient's radiation burn appears to be healing well. He was advised to be cautious to avoid rubbing or irritating the healing tissue.   The patient was advised to call immediately if he has any concerning symptoms in the interval. The patient voices understanding of current disease status and treatment options and is in agreement with the current care plan. All questions were answered. The patient knows to call the clinic with any problems, questions  or concerns. We can certainly see the patient much sooner if necessary   No orders of the defined types were placed in this encounter.    Cassandra L Heilingoetter, PA-C 04/27/19

## 2019-04-27 NOTE — Telephone Encounter (Signed)
Notified of neutropenic precautions. WBC low

## 2019-05-04 ENCOUNTER — Other Ambulatory Visit: Payer: Self-pay

## 2019-05-04 ENCOUNTER — Inpatient Hospital Stay: Payer: Medicare Other

## 2019-05-04 DIAGNOSIS — C3412 Malignant neoplasm of upper lobe, left bronchus or lung: Secondary | ICD-10-CM

## 2019-05-04 DIAGNOSIS — Z5112 Encounter for antineoplastic immunotherapy: Secondary | ICD-10-CM | POA: Diagnosis not present

## 2019-05-04 LAB — CMP (CANCER CENTER ONLY)
ALT: 20 U/L (ref 0–44)
AST: 19 U/L (ref 15–41)
Albumin: 3.9 g/dL (ref 3.5–5.0)
Alkaline Phosphatase: 81 U/L (ref 38–126)
Anion gap: 13 (ref 5–15)
BUN: 16 mg/dL (ref 8–23)
CO2: 25 mmol/L (ref 22–32)
Calcium: 9.9 mg/dL (ref 8.9–10.3)
Chloride: 108 mmol/L (ref 98–111)
Creatinine: 0.96 mg/dL (ref 0.61–1.24)
GFR, Est AFR Am: >60 mL/min (ref >60)
GFR, Estimated: >60 mL/min (ref >60)
Glucose, Bld: 86 mg/dL (ref 70–99)
Potassium: 5 mmol/L (ref 3.5–5.1)
Sodium: 146 mmol/L — ABNORMAL HIGH (ref 135–145)
Total Bilirubin: <0.2 mg/dL — ABNORMAL LOW (ref 0.3–1.2)
Total Protein: 7.3 g/dL (ref 6.5–8.1)

## 2019-05-04 LAB — CBC WITH DIFFERENTIAL (CANCER CENTER ONLY)
Abs Immature Granulocytes: 0.01 10*3/uL (ref 0.00–0.07)
Basophils Absolute: 0 10*3/uL (ref 0.0–0.1)
Basophils Relative: 0 %
Eosinophils Absolute: 0.1 10*3/uL (ref 0.0–0.5)
Eosinophils Relative: 1 %
HCT: 33.6 % — ABNORMAL LOW (ref 39.0–52.0)
Hemoglobin: 11.1 g/dL — ABNORMAL LOW (ref 13.0–17.0)
Immature Granulocytes: 0 %
Lymphocytes Relative: 19 %
Lymphs Abs: 0.7 10*3/uL (ref 0.7–4.0)
MCH: 32.7 pg (ref 26.0–34.0)
MCHC: 33 g/dL (ref 30.0–36.0)
MCV: 99.1 fL (ref 80.0–100.0)
Monocytes Absolute: 0.7 10*3/uL (ref 0.1–1.0)
Monocytes Relative: 18 %
Neutro Abs: 2.2 10*3/uL (ref 1.7–7.7)
Neutrophils Relative %: 62 %
Platelet Count: 166 10*3/uL (ref 150–400)
RBC: 3.39 MIL/uL — ABNORMAL LOW (ref 4.22–5.81)
RDW: 14.1 % (ref 11.5–15.5)
WBC Count: 3.6 10*3/uL — ABNORMAL LOW (ref 4.0–10.5)
nRBC: 0 % (ref 0.0–0.2)

## 2019-05-05 ENCOUNTER — Telehealth: Payer: Self-pay | Admitting: Radiation Oncology

## 2019-05-05 NOTE — Telephone Encounter (Signed)
  Radiation Oncology         [phone] ________________________________  Name: Trevor Bailey MRN: [id number]  Date of Service: 05/05/2019  DOB: 18-Apr-1946  Post Treatment Telephone Note  Diagnosis:   Stage IIIB/IV, T1bN3M0/M1c NSCLC, adenocarcinoma of the LUL with left supraclavicular disease, and oligometastatic right adrenal disease.  Interval Since Last Radiation: 4 weeks   03/28/2019-04/07/2019 SBRT Treatment: The right adrenal gland target was treated to 50 Gy in 5 fractions.   01/31/19 - 03/16/19: The patient was treated to the disease within the left lung including his left supraclavicular nodes initially to a dose of 60 Gy using a 4 field, 3-D conformal technique. The patient then received a cone down boost treatment for an additional 6 Gy. This yielded a final total dose of 66 Gy.   Narrative:  The patient was contacted today for routine follow-up. During treatment he did very well with radiotherapy. He did have mild dry desquamation along the base of the left supraclavicular treatment site. He feels as though his skin has improved. From time to time he has some discomfort in his chest that lasts a few seconds then resolves.  Impression/Plan: 1. Stage IIIB/IV, T1bN3M0/M1c NSCLC, adenocarcinoma of the LUL with left supraclavicular disease, and oligometastatic right adrenal disease. The patient has been doing well since completion of radiotherapy. We discussed that we would be happy to continue to follow her as needed, but he will also continue to follow up with Dr. Julien Nordmann in medical oncology. I let him know to inform us of progressive discomfort in the chest, but if it was an acute persistent problem it should be evaluated in an urgent setting.     Carola Rhine, PAC

## 2019-05-11 ENCOUNTER — Encounter: Payer: Self-pay | Admitting: Internal Medicine

## 2019-05-11 ENCOUNTER — Other Ambulatory Visit: Payer: Self-pay

## 2019-05-11 ENCOUNTER — Inpatient Hospital Stay: Payer: Medicare Other

## 2019-05-11 ENCOUNTER — Inpatient Hospital Stay (HOSPITAL_BASED_OUTPATIENT_CLINIC_OR_DEPARTMENT_OTHER): Payer: Medicare Other | Admitting: Internal Medicine

## 2019-05-11 VITALS — BP 113/68 | HR 87 | Temp 98.7°F | Resp 18 | Ht 69.0 in | Wt 197.8 lb

## 2019-05-11 DIAGNOSIS — Z5111 Encounter for antineoplastic chemotherapy: Secondary | ICD-10-CM

## 2019-05-11 DIAGNOSIS — C3412 Malignant neoplasm of upper lobe, left bronchus or lung: Secondary | ICD-10-CM

## 2019-05-11 DIAGNOSIS — Z5112 Encounter for antineoplastic immunotherapy: Secondary | ICD-10-CM

## 2019-05-11 DIAGNOSIS — I10 Essential (primary) hypertension: Secondary | ICD-10-CM

## 2019-05-11 LAB — CBC WITH DIFFERENTIAL (CANCER CENTER ONLY)
Abs Immature Granulocytes: 0.01 10*3/uL (ref 0.00–0.07)
Basophils Absolute: 0 10*3/uL (ref 0.0–0.1)
Basophils Relative: 1 %
Eosinophils Absolute: 0.1 10*3/uL (ref 0.0–0.5)
Eosinophils Relative: 2 %
HCT: 36 % — ABNORMAL LOW (ref 39.0–52.0)
Hemoglobin: 12.1 g/dL — ABNORMAL LOW (ref 13.0–17.0)
Immature Granulocytes: 0 %
Lymphocytes Relative: 17 %
Lymphs Abs: 0.5 10*3/uL — ABNORMAL LOW (ref 0.7–4.0)
MCH: 32.4 pg (ref 26.0–34.0)
MCHC: 33.6 g/dL (ref 30.0–36.0)
MCV: 96.5 fL (ref 80.0–100.0)
Monocytes Absolute: 0.5 10*3/uL (ref 0.1–1.0)
Monocytes Relative: 18 %
Neutro Abs: 1.7 10*3/uL (ref 1.7–7.7)
Neutrophils Relative %: 62 %
Platelet Count: 226 10*3/uL (ref 150–400)
RBC: 3.73 MIL/uL — ABNORMAL LOW (ref 4.22–5.81)
RDW: 14.6 % (ref 11.5–15.5)
WBC Count: 2.8 10*3/uL — ABNORMAL LOW (ref 4.0–10.5)
nRBC: 0 % (ref 0.0–0.2)

## 2019-05-11 LAB — CMP (CANCER CENTER ONLY)
ALT: 32 U/L (ref 0–44)
AST: 25 U/L (ref 15–41)
Albumin: 3.6 g/dL (ref 3.5–5.0)
Alkaline Phosphatase: 83 U/L (ref 38–126)
Anion gap: 13 (ref 5–15)
BUN: 14 mg/dL (ref 8–23)
CO2: 22 mmol/L (ref 22–32)
Calcium: 9.8 mg/dL (ref 8.9–10.3)
Chloride: 106 mmol/L (ref 98–111)
Creatinine: 0.95 mg/dL (ref 0.61–1.24)
GFR, Est AFR Am: >60 mL/min (ref >60)
GFR, Estimated: >60 mL/min (ref >60)
Glucose, Bld: 159 mg/dL — ABNORMAL HIGH (ref 70–99)
Potassium: 3.9 mmol/L (ref 3.5–5.1)
Sodium: 141 mmol/L (ref 135–145)
Total Bilirubin: 0.3 mg/dL (ref 0.3–1.2)
Total Protein: 7.2 g/dL (ref 6.5–8.1)

## 2019-05-11 MED ORDER — PALONOSETRON HCL INJECTION 0.25 MG/5ML
0.2500 mg | Freq: Once | INTRAVENOUS | Status: AC
  Administered 2019-05-11: 0.25 mg via INTRAVENOUS

## 2019-05-11 MED ORDER — SODIUM CHLORIDE 0.9 % IV SOLN
Freq: Once | INTRAVENOUS | Status: AC
  Filled 2019-05-11: qty 5

## 2019-05-11 MED ORDER — SODIUM CHLORIDE 0.9 % IV SOLN
480.0000 mg/m2 | Freq: Once | INTRAVENOUS | Status: AC
  Administered 2019-05-11: 15:00:00 1000 mg via INTRAVENOUS
  Filled 2019-05-11: qty 40

## 2019-05-11 MED ORDER — SODIUM CHLORIDE 0.9 % IV SOLN
537.5000 mg | Freq: Once | INTRAVENOUS | Status: AC
  Administered 2019-05-11: 540 mg via INTRAVENOUS
  Filled 2019-05-11: qty 54

## 2019-05-11 MED ORDER — SODIUM CHLORIDE 0.9 % IV SOLN
Freq: Once | INTRAVENOUS | Status: AC
  Filled 2019-05-11: qty 250

## 2019-05-11 MED ORDER — PALONOSETRON HCL INJECTION 0.25 MG/5ML
INTRAVENOUS | Status: AC
  Filled 2019-05-11: qty 5

## 2019-05-11 MED ORDER — SODIUM CHLORIDE 0.9 % IV SOLN
200.0000 mg | Freq: Once | INTRAVENOUS | Status: AC
  Administered 2019-05-11: 14:00:00 200 mg via INTRAVENOUS
  Filled 2019-05-11: qty 8

## 2019-05-11 NOTE — Patient Instructions (Signed)
Trevor Bailey Discharge Instructions for Patients Receiving Chemotherapy  Today you received the following chemotherapy agents: Pembrolizumab (Keytruda), Pemetrexed (Alimta), and Carboplatin (Paraplatin)  To help prevent nausea and vomiting after your treatment, we encourage you to take your nausea medication as directed.    If you develop nausea and vomiting that is not controlled by your nausea medication, call the clinic.   BELOW ARE SYMPTOMS THAT SHOULD BE REPORTED IMMEDIATELY:  *FEVER GREATER THAN 100.5 F  *CHILLS WITH OR WITHOUT FEVER  NAUSEA AND VOMITING THAT IS NOT CONTROLLED WITH YOUR NAUSEA MEDICATION  *UNUSUAL SHORTNESS OF BREATH  *UNUSUAL BRUISING OR BLEEDING  TENDERNESS IN MOUTH AND THROAT WITH OR WITHOUT PRESENCE OF ULCERS  *URINARY PROBLEMS  *BOWEL PROBLEMS  UNUSUAL RASH Items with * indicate a potential emergency and should be followed up as soon as possible.  Feel free to call the clinic should you have any questions or concerns. The clinic phone number is [phone].  Please show the [place] at check-in to the Emergency Department and triage nurse.

## 2019-05-11 NOTE — Progress Notes (Signed)
[place] [phone]   [phone]  OFFICE PROGRESS NOTE  [name], MD [address] [place] [address]  DIAGNOSIS:  Stage IV (T1b, N3, and 1C) non-small cell lung cancer, adenocarcinoma. 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  Biomarker Findings Tumor Mutational Burden - 10 Muts/Mb Microsatellite status - MS-Stable Genomic Findings For a complete list of the genes assayed, please refer to the Appendix. ERBB2 amplification - equivocal? FXJ88 T25* FANCC splice site 498+2M>E SMAD4 Q289* TP53 E298* 7 Disease relevant genes with no reportable alterations: ALK, BRAF, EGFR, KRAS, MET, RET, ROS1   PDL1 expression is 30%.  PRIOR THERAPY: 1) Weekly concurrent chemoradiation with carboplatin for an AUC of 2 and paclitaxel 45 mg/m2 to the locally advanced disease in the chest. He started the first dose 01/31/2019. Status post7cycles.Last dose of chemotherapy was given on March 14, 2019. 2) palliative radiotherapy to the metastatic disease in the right adrenal gland.  CURRENT THERAPY: Systemic chemotherapy with carboplatin for AUC of 5, Alimta 500 mg/M2 and Keytruda 200 mg IV every 3 weeks. First dose April 20, 2019. Status post 1 cycle.   INTERVAL HISTORY: Trevor Bailey 73 y.o. male returns to the clinic today for follow-up visit.  The patient tolerated the first cycle of his systemic chemotherapy with carboplatin, Alimta and Keytruda fairly well.  He denied having any chest pain, shortness of breath, cough or hemoptysis.  He has no nausea, vomiting, diarrhea or constipation.  He has some aching pain all over his body.  He denied having any recent weight loss or night sweats.  He is here today for evaluation before starting cycle #2.   MEDICAL  HISTORY: Past Medical History:  Diagnosis Date  . Adenopathy    left supraclavicular lymph node  . Allergies   . COPD (chronic obstructive pulmonary disease) ([place])   . Coronary disease   . Diabetes ([place])   . Enlarged prostate   . GERD (gastroesophageal reflux disease)   . Glaucoma   . Hyperlipidemia   . Hypertension   . Myocardial infarction ([place])   . Sleep apnea    wears CPAP  . Wears partial dentures    upper and lower    ALLERGIES:  has No Known Allergies.  MEDICATIONS:  Current Outpatient Medications  Medication Sig Dispense Refill  . amLODipine (NORVASC) 5 MG tablet Take 1 tablet (5 mg total) by mouth daily. 90 tablet 3  . aspirin EC 81 MG tablet Take 81 mg by mouth daily.    Marland Kitchen atorvastatin (LIPITOR) 80 MG tablet Take 1 tablet (80 mg total) by mouth daily. 90 tablet 3  . augmented betamethasone dipropionate (DIPROLENE-AF) 0.05 % cream     . CLARITIN 10 MG tablet Take 10 mg by mouth daily.     . clopidogrel (PLAVIX) 75 MG tablet Take 1 tablet (75 mg total) by mouth every evening. 30 tablet 0  . finasteride (PROSCAR) 5 MG tablet Take 5 mg by mouth daily.     . fluconazole (DIFLUCAN) 100 MG tablet Take 1 tablet (100 mg total) by mouth daily. 7 tablet 0  . folic acid (FOLVITE) 1 MG tablet Take 1 tablet (1 mg total) by mouth daily. 30 tablet 4  . hydrochlorothiazide (HYDRODIURIL) 25 MG tablet Take 25 mg by mouth daily.     Marland Kitchen  latanoprost (XALATAN) 0.005 % ophthalmic solution Place 1 drop into both eyes at bedtime.     . metFORMIN (GLUCOPHAGE) 1000 MG tablet Take 1,000 mg by mouth 2 (two) times daily.     . metoprolol tartrate (LOPRESSOR) 25 MG tablet Take 25 mg 2 (two) times daily by mouth.    . Multiple Vitamin (MULTIVITAMIN WITH MINERALS) TABS tablet Take 1 tablet by mouth daily. Centrum Silver    . Multiple Vitamins-Minerals (PRESERVISION AREDS 2 PO) Take 1 tablet by mouth 2 (two) times daily.    . nitroGLYCERIN (NITROSTAT) 0.4 MG SL tablet Place 1 tablet (0.4 mg total)  under the tongue every 5 (five) minutes as needed for chest pain. 25 tablet 6  . pantoprazole (PROTONIX) 40 MG tablet Take 40 mg daily by mouth.    Vladimir Faster Glycol-Propyl Glycol (SYSTANE) 0.4-0.3 % SOLN Place 1-2 drops into both eyes 3 (three) times daily as needed (dry/irritated eyes.).    Marland Kitchen prochlorperazine (COMPAZINE) 10 MG tablet Take 1 tablet (10 mg total) by mouth every 6 (six) hours as needed for nausea or vomiting. 30 tablet 2  . ramipril (ALTACE) 10 MG capsule Take 10 mg by mouth 2 (two) times daily.     . sucralfate (CARAFATE) 1 g tablet Take 1 tablet (1 g total) by mouth 4 (four) times daily -  with meals and at bedtime. Crush tablet and mix in 1 oz water and take po qAC and HS 120 tablet 1  . traMADol (ULTRAM) 50 MG tablet Take 1 tablet (50 mg total) by mouth every 6 (six) hours as needed. 20 tablet 0  . VENTOLIN HFA 108 (90 Base) MCG/ACT inhaler Inhale 1-2 puffs into the lungs every 6 (six) hours as needed for wheezing or shortness of breath.   0  . ZETIA 10 MG tablet Take 10 mg by mouth every evening.      No current facility-administered medications for this visit.    SURGICAL HISTORY:  Past Surgical History:  Procedure Laterality Date  . ABDOMINAL AORTAGRAM Left 09/16/2013   Procedure: ABDOMINAL Maxcine Ham;  Surgeon: Elam Dutch, MD;  Location: [facility] CATH LAB;  Service: Cardiovascular;  Laterality: Left;  . CARDIAC CATHETERIZATION  01/2011   When there was segmental stenosis of the distal RCA, patent PCA stent, patent circumflex stent, and a patent small diagonal with 90% ISR.   Marland Kitchen COLONOSCOPY W/ BIOPSIES AND POLYPECTOMY    . CORONARY ANGIOPLASTY  may 2002   Non-DES stenting of his circumflex, non-DES stenting of the RCA  . HYDROCELE EXCISION / REPAIR  2009   by Dr Janice Norrie  . INGUINAL HERNIA REPAIR Bilateral    Dr Bubba Camp  . LYMPH NODE BIOPSY Left 01/10/2019   Procedure: left supraclavicular LYMPH NODE BIOPSY;  Surgeon: [name], MD;  Location: [facility];  Service:  Thoracic;  Laterality: Left;  Marland Kitchen MULTIPLE TOOTH EXTRACTIONS    . NM MYOCAR PERF WALL MOTION  01/08/2011   moderate in size and intensity area of reversible ischemia in the basal to mid inferior and septal territories. Abnormal study  . stents  2008   proximal RCA, DES for progession of disease.  Marland Kitchen US ECHOCARDIOGRAPHY  01/12/2012   mild concentric LVH, borderline LA enlargement, mild to mod TR    REVIEW OF SYSTEMS:  A comprehensive review of systems was negative except for: Musculoskeletal: positive for arthralgias   PHYSICAL EXAMINATION: General appearance: alert, cooperative and no distress Head: Normocephalic, without obvious abnormality, atraumatic Neck: no adenopathy, no  JVD, supple, symmetrical, trachea midline and thyroid not enlarged, symmetric, no tenderness/mass/nodules Lymph nodes: Cervical, supraclavicular, and axillary nodes normal. Resp: clear to auscultation bilaterally Back: symmetric, no curvature. ROM normal. No CVA tenderness. Cardio: regular rate and rhythm, S1, S2 normal, no murmur, click, rub or gallop GI: soft, non-tender; bowel sounds normal; no masses,  no organomegaly Extremities: extremities normal, atraumatic, no cyanosis or edema  ECOG PERFORMANCE STATUS: 1 - Symptomatic but completely ambulatory  Blood pressure 113/68, pulse 87, temperature 98.7 F (37.1 C), temperature source Temporal, resp. rate 18, height '5\' 9"'$  (1.753 m), weight 197 lb 12.8 oz (89.7 kg), SpO2 100 %.  LABORATORY DATA: Lab Results  Component Value Date   WBC 2.8 (L) 05/11/2019   HGB 12.1 (L) 05/11/2019   HCT 36.0 (L) 05/11/2019   MCV 96.5 05/11/2019   PLT 226 05/11/2019      Chemistry      Component Value Date/Time   NA 141 05/11/2019 1133   K 3.9 05/11/2019 1133   CL 106 05/11/2019 1133   CO2 22 05/11/2019 1133   BUN 14 05/11/2019 1133   CREATININE 0.95 05/11/2019 1133   CREATININE 0.94 05/16/2014 1109      Component Value Date/Time   CALCIUM 9.8 05/11/2019 1133   ALKPHOS  83 05/11/2019 1133   AST 25 05/11/2019 1133   ALT 32 05/11/2019 1133   BILITOT 0.3 05/11/2019 1133       RADIOGRAPHIC STUDIES: No results found.  ASSESSMENT AND PLAN: This is a very pleasant 73 years old African-American male with stage IV non-small cell lung cancer, adenocarcinoma with no actionable mutation and PD-L1 expression of 30%, presented with locally advanced disease in the chest as well as solitary metastasis to the right adrenal gland. The patient completed a course of concurrent chemoradiation to the chest with weekly carboplatin for AUC of 2 and paclitaxel 45 mg/M2 status post 7 cycles..  The patient also received palliative radiotherapy to the adrenal glands. He is currently undergoing systemic chemotherapy with carboplatin, Alimta and Keytruda status post 1 cycle and tolerated the first cycle of his treatment fairly well. I recommended for him to proceed with cycle #2 today as planned. I will see him back for follow-up visit in 3 weeks for evaluation before starting cycle #3. He was advised to call immediately if he has any concerning symptoms in the interval. The patient voices understanding of current disease status and treatment options and is in agreement with the current care plan.  All questions were answered. The patient knows to call the clinic with any problems, questions or concerns. We can certainly see the patient much sooner if necessary.  I spent 10 minutes counseling the patient face to face. The total time spent in the appointment was 15 minutes.  Disclaimer: This note was dictated with voice recognition software. Similar sounding words can inadvertently be transcribed and may not be corrected upon review.

## 2019-05-12 ENCOUNTER — Telehealth: Payer: Self-pay | Admitting: Internal Medicine

## 2019-05-12 NOTE — Telephone Encounter (Signed)
Per 12/23 los appts scheduled.

## 2019-05-18 ENCOUNTER — Inpatient Hospital Stay: Payer: Medicare Other

## 2019-05-18 ENCOUNTER — Other Ambulatory Visit: Payer: Self-pay | Admitting: Medical Oncology

## 2019-05-18 ENCOUNTER — Other Ambulatory Visit: Payer: Self-pay

## 2019-05-18 VITALS — BP 128/77 | HR 89 | Temp 98.2°F | Resp 20

## 2019-05-18 DIAGNOSIS — D702 Other drug-induced agranulocytosis: Secondary | ICD-10-CM

## 2019-05-18 DIAGNOSIS — Z5112 Encounter for antineoplastic immunotherapy: Secondary | ICD-10-CM | POA: Diagnosis not present

## 2019-05-18 DIAGNOSIS — C3412 Malignant neoplasm of upper lobe, left bronchus or lung: Secondary | ICD-10-CM

## 2019-05-18 LAB — CBC WITH DIFFERENTIAL (CANCER CENTER ONLY)
Abs Immature Granulocytes: 0.01 10*3/uL (ref 0.00–0.07)
Basophils Absolute: 0 10*3/uL (ref 0.0–0.1)
Basophils Relative: 1 %
Eosinophils Absolute: 0 10*3/uL (ref 0.0–0.5)
Eosinophils Relative: 1 %
HCT: 32.4 % — ABNORMAL LOW (ref 39.0–52.0)
Hemoglobin: 10.8 g/dL — ABNORMAL LOW (ref 13.0–17.0)
Immature Granulocytes: 1 %
Lymphocytes Relative: 32 %
Lymphs Abs: 0.3 10*3/uL — ABNORMAL LOW (ref 0.7–4.0)
MCH: 32.5 pg (ref 26.0–34.0)
MCHC: 33.3 g/dL (ref 30.0–36.0)
MCV: 97.6 fL (ref 80.0–100.0)
Monocytes Absolute: 0.2 10*3/uL (ref 0.1–1.0)
Monocytes Relative: 17 %
Neutro Abs: 0.5 10*3/uL — ABNORMAL LOW (ref 1.7–7.7)
Neutrophils Relative %: 48 %
Platelet Count: 186 10*3/uL (ref 150–400)
RBC: 3.32 MIL/uL — ABNORMAL LOW (ref 4.22–5.81)
RDW: 13.7 % (ref 11.5–15.5)
WBC Count: 0.9 10*3/uL — CL (ref 4.0–10.5)
nRBC: 0 % (ref 0.0–0.2)

## 2019-05-18 LAB — CMP (CANCER CENTER ONLY)
ALT: 23 U/L (ref 0–44)
AST: 19 U/L (ref 15–41)
Albumin: 3.7 g/dL (ref 3.5–5.0)
Alkaline Phosphatase: 87 U/L (ref 38–126)
Anion gap: 12 (ref 5–15)
BUN: 17 mg/dL (ref 8–23)
CO2: 25 mmol/L (ref 22–32)
Calcium: 9.5 mg/dL (ref 8.9–10.3)
Chloride: 104 mmol/L (ref 98–111)
Creatinine: 0.88 mg/dL (ref 0.61–1.24)
GFR, Est AFR Am: >60 mL/min (ref >60)
GFR, Estimated: >60 mL/min (ref >60)
Glucose, Bld: 101 mg/dL — ABNORMAL HIGH (ref 70–99)
Potassium: 4.2 mmol/L (ref 3.5–5.1)
Sodium: 141 mmol/L (ref 135–145)
Total Bilirubin: 0.4 mg/dL (ref 0.3–1.2)
Total Protein: 7.4 g/dL (ref 6.5–8.1)

## 2019-05-18 LAB — TSH: TSH: 0.551 u[IU]/mL (ref 0.320–4.118)

## 2019-05-18 MED ORDER — FILGRASTIM-SNDZ 480 MCG/0.8ML IJ SOSY
480.0000 ug | PREFILLED_SYRINGE | Freq: Once | INTRAMUSCULAR | Status: AC
  Administered 2019-05-18: 480 ug via SUBCUTANEOUS

## 2019-05-18 MED ORDER — FILGRASTIM-SNDZ 480 MCG/0.8ML IJ SOSY
PREFILLED_SYRINGE | INTRAMUSCULAR | Status: AC
  Filled 2019-05-18: qty 0.8

## 2019-05-18 MED ORDER — TBO-FILGRASTIM 480 MCG/0.8ML ~~LOC~~ SOSY: 480.0000 ug | PREFILLED_SYRINGE | Freq: Once | SUBCUTANEOUS | Status: DC

## 2019-05-18 MED ORDER — TBO-FILGRASTIM 480 MCG/0.8ML ~~LOC~~ SOSY
PREFILLED_SYRINGE | SUBCUTANEOUS | Status: AC
  Filled 2019-05-18: qty 0.8

## 2019-05-18 NOTE — Progress Notes (Signed)
LVM for pt to come in 12/31 for injection at 230pm.

## 2019-05-18 NOTE — Patient Instructions (Addendum)

## 2019-05-19 ENCOUNTER — Other Ambulatory Visit: Payer: Self-pay

## 2019-05-19 ENCOUNTER — Inpatient Hospital Stay: Payer: Medicare Other

## 2019-05-19 VITALS — BP 120/70 | HR 88 | Temp 98.7°F | Resp 18

## 2019-05-19 DIAGNOSIS — D702 Other drug-induced agranulocytosis: Secondary | ICD-10-CM

## 2019-05-19 DIAGNOSIS — Z5112 Encounter for antineoplastic immunotherapy: Secondary | ICD-10-CM | POA: Diagnosis not present

## 2019-05-19 MED ORDER — FILGRASTIM-SNDZ 480 MCG/0.8ML IJ SOSY
PREFILLED_SYRINGE | INTRAMUSCULAR | Status: AC
  Filled 2019-05-19: qty 0.8

## 2019-05-19 MED ORDER — FILGRASTIM-SNDZ 480 MCG/0.8ML IJ SOSY
480.0000 ug | PREFILLED_SYRINGE | Freq: Once | INTRAMUSCULAR | Status: AC
  Administered 2019-05-19: 480 ug via SUBCUTANEOUS

## 2019-05-19 NOTE — Patient Instructions (Signed)

## 2019-05-25 ENCOUNTER — Inpatient Hospital Stay: Payer: Medicare Other | Attending: Internal Medicine

## 2019-05-25 ENCOUNTER — Other Ambulatory Visit: Payer: Self-pay

## 2019-05-25 DIAGNOSIS — Z5111 Encounter for antineoplastic chemotherapy: Secondary | ICD-10-CM | POA: Insufficient documentation

## 2019-05-25 DIAGNOSIS — C3412 Malignant neoplasm of upper lobe, left bronchus or lung: Secondary | ICD-10-CM | POA: Insufficient documentation

## 2019-05-25 DIAGNOSIS — Z5112 Encounter for antineoplastic immunotherapy: Secondary | ICD-10-CM | POA: Insufficient documentation

## 2019-05-25 DIAGNOSIS — C7971 Secondary malignant neoplasm of right adrenal gland: Secondary | ICD-10-CM | POA: Insufficient documentation

## 2019-05-25 DIAGNOSIS — Z79899 Other long term (current) drug therapy: Secondary | ICD-10-CM | POA: Diagnosis not present

## 2019-05-25 LAB — CMP (CANCER CENTER ONLY)
ALT: 23 U/L (ref 0–44)
AST: 18 U/L (ref 15–41)
Albumin: 3.8 g/dL (ref 3.5–5.0)
Alkaline Phosphatase: 89 U/L (ref 38–126)
Anion gap: 11 (ref 5–15)
BUN: 15 mg/dL (ref 8–23)
CO2: 24 mmol/L (ref 22–32)
Calcium: 9.5 mg/dL (ref 8.9–10.3)
Chloride: 105 mmol/L (ref 98–111)
Creatinine: 0.83 mg/dL (ref 0.61–1.24)
GFR, Est AFR Am: >60 mL/min (ref >60)
GFR, Estimated: >60 mL/min (ref >60)
Glucose, Bld: 187 mg/dL — ABNORMAL HIGH (ref 70–99)
Potassium: 3.7 mmol/L (ref 3.5–5.1)
Sodium: 140 mmol/L (ref 135–145)
Total Bilirubin: <0.2 mg/dL — ABNORMAL LOW (ref 0.3–1.2)
Total Protein: 7.1 g/dL (ref 6.5–8.1)

## 2019-05-25 LAB — CBC WITH DIFFERENTIAL (CANCER CENTER ONLY)
Abs Immature Granulocytes: 0.07 10*3/uL (ref 0.00–0.07)
Basophils Absolute: 0 10*3/uL (ref 0.0–0.1)
Basophils Relative: 0 %
Eosinophils Absolute: 0 10*3/uL (ref 0.0–0.5)
Eosinophils Relative: 1 %
HCT: 31.4 % — ABNORMAL LOW (ref 39.0–52.0)
Hemoglobin: 10.6 g/dL — ABNORMAL LOW (ref 13.0–17.0)
Immature Granulocytes: 2 %
Lymphocytes Relative: 9 %
Lymphs Abs: 0.4 10*3/uL — ABNORMAL LOW (ref 0.7–4.0)
MCH: 33 pg (ref 26.0–34.0)
MCHC: 33.8 g/dL (ref 30.0–36.0)
MCV: 97.8 fL (ref 80.0–100.0)
Monocytes Absolute: 0.7 10*3/uL (ref 0.1–1.0)
Monocytes Relative: 19 %
Neutro Abs: 2.7 10*3/uL (ref 1.7–7.7)
Neutrophils Relative %: 69 %
Platelet Count: 138 10*3/uL — ABNORMAL LOW (ref 150–400)
RBC: 3.21 MIL/uL — ABNORMAL LOW (ref 4.22–5.81)
RDW: 14.1 % (ref 11.5–15.5)
WBC Count: 3.9 10*3/uL — ABNORMAL LOW (ref 4.0–10.5)
nRBC: 0.5 % — ABNORMAL HIGH (ref 0.0–0.2)

## 2019-05-25 LAB — TSH: TSH: 0.397 u[IU]/mL (ref 0.320–4.118)

## 2019-06-01 ENCOUNTER — Encounter: Payer: Self-pay | Admitting: Internal Medicine

## 2019-06-01 ENCOUNTER — Inpatient Hospital Stay: Payer: Medicare Other

## 2019-06-01 ENCOUNTER — Inpatient Hospital Stay (HOSPITAL_BASED_OUTPATIENT_CLINIC_OR_DEPARTMENT_OTHER): Payer: Medicare Other | Admitting: Internal Medicine

## 2019-06-01 ENCOUNTER — Other Ambulatory Visit: Payer: Self-pay

## 2019-06-01 VITALS — BP 147/83 | HR 68 | Temp 98.3°F | Resp 18 | Ht 69.0 in | Wt 198.9 lb

## 2019-06-01 DIAGNOSIS — C3412 Malignant neoplasm of upper lobe, left bronchus or lung: Secondary | ICD-10-CM

## 2019-06-01 DIAGNOSIS — Z5111 Encounter for antineoplastic chemotherapy: Secondary | ICD-10-CM

## 2019-06-01 DIAGNOSIS — C3490 Malignant neoplasm of unspecified part of unspecified bronchus or lung: Secondary | ICD-10-CM | POA: Diagnosis not present

## 2019-06-01 DIAGNOSIS — Z5112 Encounter for antineoplastic immunotherapy: Secondary | ICD-10-CM

## 2019-06-01 DIAGNOSIS — C7971 Secondary malignant neoplasm of right adrenal gland: Secondary | ICD-10-CM | POA: Diagnosis not present

## 2019-06-01 DIAGNOSIS — I10 Essential (primary) hypertension: Secondary | ICD-10-CM

## 2019-06-01 LAB — CMP (CANCER CENTER ONLY)
ALT: 25 U/L (ref 0–44)
AST: 24 U/L (ref 15–41)
Albumin: 3.7 g/dL (ref 3.5–5.0)
Alkaline Phosphatase: 81 U/L (ref 38–126)
Anion gap: 12 (ref 5–15)
BUN: 14 mg/dL (ref 8–23)
CO2: 24 mmol/L (ref 22–32)
Calcium: 9.2 mg/dL (ref 8.9–10.3)
Chloride: 105 mmol/L (ref 98–111)
Creatinine: 0.83 mg/dL (ref 0.61–1.24)
GFR, Est AFR Am: >60 mL/min (ref >60)
GFR, Estimated: >60 mL/min (ref >60)
Glucose, Bld: 100 mg/dL — ABNORMAL HIGH (ref 70–99)
Potassium: 3.5 mmol/L (ref 3.5–5.1)
Sodium: 141 mmol/L (ref 135–145)
Total Bilirubin: 0.2 mg/dL — ABNORMAL LOW (ref 0.3–1.2)
Total Protein: 7.1 g/dL (ref 6.5–8.1)

## 2019-06-01 LAB — CBC WITH DIFFERENTIAL (CANCER CENTER ONLY)
Abs Immature Granulocytes: 0.01 10*3/uL (ref 0.00–0.07)
Basophils Absolute: 0 10*3/uL (ref 0.0–0.1)
Basophils Relative: 1 %
Eosinophils Absolute: 0 10*3/uL (ref 0.0–0.5)
Eosinophils Relative: 1 %
HCT: 32.3 % — ABNORMAL LOW (ref 39.0–52.0)
Hemoglobin: 10.8 g/dL — ABNORMAL LOW (ref 13.0–17.0)
Immature Granulocytes: 0 %
Lymphocytes Relative: 16 %
Lymphs Abs: 0.4 10*3/uL — ABNORMAL LOW (ref 0.7–4.0)
MCH: 32.8 pg (ref 26.0–34.0)
MCHC: 33.4 g/dL (ref 30.0–36.0)
MCV: 98.2 fL (ref 80.0–100.0)
Monocytes Absolute: 0.6 10*3/uL (ref 0.1–1.0)
Monocytes Relative: 21 %
Neutro Abs: 1.8 10*3/uL (ref 1.7–7.7)
Neutrophils Relative %: 61 %
Platelet Count: 211 10*3/uL (ref 150–400)
RBC: 3.29 MIL/uL — ABNORMAL LOW (ref 4.22–5.81)
RDW: 15 % (ref 11.5–15.5)
WBC Count: 2.8 10*3/uL — ABNORMAL LOW (ref 4.0–10.5)
nRBC: 0 % (ref 0.0–0.2)

## 2019-06-01 MED ORDER — SODIUM CHLORIDE 0.9 % IV SOLN
200.0000 mg | Freq: Once | INTRAVENOUS | Status: AC
  Administered 2019-06-01: 200 mg via INTRAVENOUS
  Filled 2019-06-01: qty 8

## 2019-06-01 MED ORDER — SODIUM CHLORIDE 0.9 % IV SOLN
Freq: Once | INTRAVENOUS | Status: AC
  Filled 2019-06-01: qty 5

## 2019-06-01 MED ORDER — SODIUM CHLORIDE 0.9 % IV SOLN
430.0000 mg | Freq: Once | INTRAVENOUS | Status: AC
  Administered 2019-06-01: 430 mg via INTRAVENOUS
  Filled 2019-06-01: qty 43

## 2019-06-01 MED ORDER — DIPHENHYDRAMINE HCL 50 MG/ML IJ SOLN
25.0000 mg | Freq: Once | INTRAMUSCULAR | Status: AC
  Administered 2019-06-01: 25 mg via INTRAVENOUS

## 2019-06-01 MED ORDER — FAMOTIDINE IN NACL 20-0.9 MG/50ML-% IV SOLN
20.0000 mg | Freq: Once | INTRAVENOUS | Status: AC
  Administered 2019-06-01: 20 mg via INTRAVENOUS

## 2019-06-01 MED ORDER — PALONOSETRON HCL INJECTION 0.25 MG/5ML
0.2500 mg | Freq: Once | INTRAVENOUS | Status: AC
  Administered 2019-06-01: 0.25 mg via INTRAVENOUS

## 2019-06-01 MED ORDER — SODIUM CHLORIDE 0.9 % IV SOLN
500.0000 mg/m2 | Freq: Once | INTRAVENOUS | Status: AC
  Administered 2019-06-01: 1000 mg via INTRAVENOUS
  Filled 2019-06-01: qty 40

## 2019-06-01 MED ORDER — PALONOSETRON HCL INJECTION 0.25 MG/5ML
INTRAVENOUS | Status: AC
  Filled 2019-06-01: qty 5

## 2019-06-01 MED ORDER — SODIUM CHLORIDE 0.9 % IV SOLN
Freq: Once | INTRAVENOUS | Status: AC
  Filled 2019-06-01: qty 250

## 2019-06-01 MED ORDER — DIPHENHYDRAMINE HCL 50 MG/ML IJ SOLN
INTRAMUSCULAR | Status: AC
  Filled 2019-06-01: qty 1

## 2019-06-01 MED ORDER — CYANOCOBALAMIN 1000 MCG/ML IJ SOLN
1000.0000 ug | Freq: Once | INTRAMUSCULAR | Status: AC
  Administered 2019-06-01: 1000 ug via INTRAMUSCULAR
  Filled 2019-06-01: qty 1

## 2019-06-01 MED ORDER — FAMOTIDINE IN NACL 20-0.9 MG/50ML-% IV SOLN
INTRAVENOUS | Status: AC
  Filled 2019-06-01: qty 50

## 2019-06-01 NOTE — Progress Notes (Signed)
[facility] [phone]   Fax:(336) [phone]  OFFICE PROGRESS NOTE  [name], MD [address] [place] [address]  DIAGNOSIS:  Stage IV (T1b, N3, M1c) non-small cell lung cancer, adenocarcinoma. 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  Biomarker Findings Tumor Mutational Burden - 10 Muts/Mb Microsatellite status - MS-Stable Genomic Findings For a complete list of the genes assayed, please refer to the Appendix. ERBB2 amplification - equivocal? GGY69 S85* FANCC splice site 462+7O>J SMAD4 Q289* TP53 E298* 7 Disease relevant genes with no reportable alterations: ALK, BRAF, EGFR, KRAS, MET, RET, ROS1   PDL1 expression is 30%.  PRIOR THERAPY: 1) Weekly concurrent chemoradiation with carboplatin for an AUC of 2 and paclitaxel 45 mg/m2 to the locally advanced disease in the chest. He started the first dose 01/31/2019. Status post7cycles.Last dose of chemotherapy was given on March 14, 2019. 2) palliative radiotherapy to the metastatic disease in the right adrenal gland.  CURRENT THERAPY: Systemic chemotherapy with carboplatin for AUC of 5, Alimta 500 mg/M2 and Keytruda 200 mg IV every 3 weeks. First dose April 20, 2019. Status post 2 cycles.   INTERVAL HISTORY: Trevor Bailey 74 y.o. male returns to the clinic today for follow-up visit.  The patient is feeling fine today with no concerning complaints except for occasional dysphagia to solid food.  He denied having any current chest pain but has shortness of breath with exertion with no cough or hemoptysis.  He has no nausea, vomiting, diarrhea or constipation.  He has no headache or visual changes.  He has no recent weight loss or night sweats.  The patient tolerated the last cycle of his treatment  fairly well.  Is here today for evaluation before starting cycle #3.   MEDICAL HISTORY: Past Medical History:  Diagnosis Date  . Adenopathy    left supraclavicular lymph node  . Allergies   . COPD (chronic obstructive pulmonary disease) ([place])   . Coronary disease   . Diabetes ([place])   . Enlarged prostate   . GERD (gastroesophageal reflux disease)   . Glaucoma   . Hyperlipidemia   . Hypertension   . Myocardial infarction ([place])   . Sleep apnea    wears CPAP  . Wears partial dentures    upper and lower    ALLERGIES:  has No Known Allergies.  MEDICATIONS:  Current Outpatient Medications  Medication Sig Dispense Refill  . amLODipine (NORVASC) 5 MG tablet Take 1 tablet (5 mg total) by mouth daily. 90 tablet 3  . aspirin EC 81 MG tablet Take 81 mg by mouth daily.    Marland Kitchen atorvastatin (LIPITOR) 80 MG tablet Take 1 tablet (80 mg total) by mouth daily. 90 tablet 3  . augmented betamethasone dipropionate (DIPROLENE-AF) 0.05 % cream     . CLARITIN 10 MG tablet Take 10 mg by mouth daily.     . clopidogrel (PLAVIX) 75 MG tablet Take 1 tablet (75 mg total) by mouth every evening. 30 tablet 0  . finasteride (PROSCAR) 5 MG tablet Take 5 mg by mouth daily.     . fluconazole (DIFLUCAN) 100 MG tablet Take 1 tablet (100 mg total) by mouth daily. 7 tablet 0  . folic acid (FOLVITE) 1 MG tablet Take 1 tablet (1 mg total) by mouth daily. 30 tablet 4  .  hydrochlorothiazide (HYDRODIURIL) 25 MG tablet Take 25 mg by mouth daily.     Marland Kitchen latanoprost (XALATAN) 0.005 % ophthalmic solution Place 1 drop into both eyes at bedtime.     . metFORMIN (GLUCOPHAGE) 1000 MG tablet Take 1,000 mg by mouth 2 (two) times daily.     . metoprolol tartrate (LOPRESSOR) 25 MG tablet Take 25 mg 2 (two) times daily by mouth.    . Multiple Vitamin (MULTIVITAMIN WITH MINERALS) TABS tablet Take 1 tablet by mouth daily. Centrum Silver    . Multiple Vitamins-Minerals (PRESERVISION AREDS 2 PO) Take 1 tablet by mouth 2 (two) times daily.     . nitroGLYCERIN (NITROSTAT) 0.4 MG SL tablet Place 1 tablet (0.4 mg total) under the tongue every 5 (five) minutes as needed for chest pain. 25 tablet 6  . pantoprazole (PROTONIX) 40 MG tablet Take 40 mg daily by mouth.    Vladimir Faster Glycol-Propyl Glycol (SYSTANE) 0.4-0.3 % SOLN Place 1-2 drops into both eyes 3 (three) times daily as needed (dry/irritated eyes.).    Marland Kitchen prochlorperazine (COMPAZINE) 10 MG tablet Take 1 tablet (10 mg total) by mouth every 6 (six) hours as needed for nausea or vomiting. 30 tablet 2  . ramipril (ALTACE) 10 MG capsule Take 10 mg by mouth 2 (two) times daily.     . sucralfate (CARAFATE) 1 g tablet Take 1 tablet (1 g total) by mouth 4 (four) times daily -  with meals and at bedtime. Crush tablet and mix in 1 oz water and take po qAC and HS 120 tablet 1  . traMADol (ULTRAM) 50 MG tablet Take 1 tablet (50 mg total) by mouth every 6 (six) hours as needed. 20 tablet 0  . VENTOLIN HFA 108 (90 Base) MCG/ACT inhaler Inhale 1-2 puffs into the lungs every 6 (six) hours as needed for wheezing or shortness of breath.   0  . ZETIA 10 MG tablet Take 10 mg by mouth every evening.      No current facility-administered medications for this visit.    SURGICAL HISTORY:  Past Surgical History:  Procedure Laterality Date  . ABDOMINAL AORTAGRAM Left 09/16/2013   Procedure: ABDOMINAL Maxcine Ham;  Surgeon: Elam Dutch, MD;  Location: [facility] C[facility] LAB;  Service: Cardiovascular;  Laterality: Left;  . CARDIAC CATHETERIZATION  01/2011   When there was segmental stenosis of the distal RCA, patent PCA stent, patent circumflex stent, and a patent small diagonal with 90% ISR.   Marland Kitchen COLONOSCOPY W/ BIOPSIES AND POLYPECTOMY    . CORONARY ANGIOPLASTY  may 2002   Non-DES stenting of his circumflex, non-DES stenting of the RCA  . HYDROCELE EXCISION / REPAIR  2009   by Dr Janice Norrie  . INGUINAL HERNIA REPAIR Bilateral    Dr Bubba Camp  . LYMPH NODE BIOPSY Left 01/10/2019   Procedure: left supraclavicular LYMPH  NODE BIOPSY;  Surgeon: [name], MD;  Location: [facility];  Service: Thoracic;  Laterality: Left;  Marland Kitchen MULTIPLE TOOTH EXTRACTIONS    . NM MYOCAR PERF WALL MOTION  01/08/2011   moderate in size and intensity area of reversible ischemia in the basal to mid inferior and septal territories. Abnormal study  . stents  2008   proximal RCA, DES for progession of disease.  Marland Kitchen US ECHOCARDIOGRAPHY  01/12/2012   mild concentric LVH, borderline LA enlargement, mild to mod TR    REVIEW OF SYSTEMS:  A comprehensive review of systems was negative except for: Respiratory: positive for dyspnea on exertion Gastrointestinal: positive for  dysphagia   PHYSICAL EXAMINATION: General appearance: alert, cooperative and no distress Head: Normocephalic, without obvious abnormality, atraumatic Neck: no adenopathy, no JVD, supple, symmetrical, trachea midline and thyroid not enlarged, symmetric, no tenderness/mass/nodules Lymph nodes: Cervical, supraclavicular, and axillary nodes normal. Resp: clear to auscultation bilaterally Back: symmetric, no curvature. ROM normal. No CVA tenderness. Cardio: regular rate and rhythm, S1, S2 normal, no murmur, click, rub or gallop GI: soft, non-tender; bowel sounds normal; no masses,  no organomegaly Extremities: extremities normal, atraumatic, no cyanosis or edema  ECOG PERFORMANCE STATUS: 1 - Symptomatic but completely ambulatory  Blood pressure (!) 147/83, pulse 68, temperature 98.3 F (36.8 C), temperature source Temporal, resp. rate 18, height '5\' 9"'$  (1.753 m), weight 198 lb 14.4 oz (90.2 kg), SpO2 99 %.  LABORATORY DATA: Lab Results  Component Value Date   WBC 2.8 (L) 06/01/2019   HGB 10.8 (L) 06/01/2019   HCT 32.3 (L) 06/01/2019   MCV 98.2 06/01/2019   PLT 211 06/01/2019      Chemistry      Component Value Date/Time   NA 140 05/25/2019 1200   K 3.7 05/25/2019 1200   CL 105 05/25/2019 1200   CO2 24 05/25/2019 1200   BUN 15 05/25/2019 1200   CREATININE 0.83  05/25/2019 1200   CREATININE 0.94 05/16/2014 1109      Component Value Date/Time   CALCIUM 9.5 05/25/2019 1200   ALKPHOS 89 05/25/2019 1200   AST 18 05/25/2019 1200   ALT 23 05/25/2019 1200   BILITOT <0.2 (L) 05/25/2019 1200       RADIOGRAPHIC STUDIES: No results found.  ASSESSMENT AND PLAN: This is a very pleasant 74 years old African-American male with stage IV non-small cell lung cancer, adenocarcinoma with no actionable mutation and PD-L1 expression of 30%, presented with locally advanced disease in the chest as well as solitary metastasis to the right adrenal gland. The patient completed a course of concurrent chemoradiation to the chest with weekly carboplatin for AUC of 2 and paclitaxel 45 mg/M2 status post 7 cycles..  The patient also received palliative radiotherapy to the adrenal glands. He is currently undergoing systemic chemotherapy with carboplatin, Alimta and Keytruda status post 2 cycles. The patient continues to tolerate this treatment well with no concerning adverse effects. I recommended for him to proceed with cycle #3 today as planned. The patient will have repeat CT scan of the chest, abdomen pelvis performed at his next cycle of the treatment. He was advised to call immediately if he has any concerning symptoms in the interval. The patient voices understanding of current disease status and treatment options and is in agreement with the current care plan.  All questions were answered. The patient knows to call the clinic with any problems, questions or concerns. We can certainly see the patient much sooner if necessary.   Disclaimer: This note was dictated with voice recognition software. Similar sounding words can inadvertently be transcribed and may not be corrected upon review.

## 2019-06-01 NOTE — Patient Instructions (Signed)
[facility] Discharge Instructions for Patients Receiving Chemotherapy  Today you received the following chemotherapy agents Keytruda/Alimta/Carboplatin  To help prevent nausea and vomiting after your treatment, we encourage you to take your nausea medication as directed BUT NO ZOFRAN FOR 3 DAYS.   If you develop nausea and vomiting that is not controlled by your nausea medication, call the clinic.   BELOW ARE SYMPTOMS THAT SHOULD BE REPORTED IMMEDIATELY:  *FEVER GREATER THAN 100.5 F  *CHILLS WITH OR WITHOUT FEVER  NAUSEA AND VOMITING THAT IS NOT CONTROLLED WITH YOUR NAUSEA MEDICATION  *UNUSUAL SHORTNESS OF BREATH  *UNUSUAL BRUISING OR BLEEDING  TENDERNESS IN MOUTH AND THROAT WITH OR WITHOUT PRESENCE OF ULCERS  *URINARY PROBLEMS  *BOWEL PROBLEMS  UNUSUAL RASH Items with * indicate a potential emergency and should be followed up as soon as possible.  Feel free to call the clinic you have any questions or concerns. The clinic phone number is [phone].  Please show the [place] at check-in to the Emergency Department and triage nurse.

## 2019-06-03 ENCOUNTER — Telehealth: Payer: Self-pay | Admitting: Internal Medicine

## 2019-06-03 NOTE — Telephone Encounter (Signed)
Scheduled per los. Called and left msg. Mailed printout  °

## 2019-06-08 ENCOUNTER — Other Ambulatory Visit: Payer: Self-pay

## 2019-06-08 ENCOUNTER — Inpatient Hospital Stay: Payer: Medicare Other

## 2019-06-08 DIAGNOSIS — C3412 Malignant neoplasm of upper lobe, left bronchus or lung: Secondary | ICD-10-CM

## 2019-06-08 DIAGNOSIS — Z5112 Encounter for antineoplastic immunotherapy: Secondary | ICD-10-CM | POA: Diagnosis not present

## 2019-06-08 LAB — CBC WITH DIFFERENTIAL (CANCER CENTER ONLY)
Abs Immature Granulocytes: 0.01 10*3/uL (ref 0.00–0.07)
Basophils Absolute: 0 10*3/uL (ref 0.0–0.1)
Basophils Relative: 1 %
Eosinophils Absolute: 0 10*3/uL (ref 0.0–0.5)
Eosinophils Relative: 1 %
HCT: 31.8 % — ABNORMAL LOW (ref 39.0–52.0)
Hemoglobin: 10.5 g/dL — ABNORMAL LOW (ref 13.0–17.0)
Immature Granulocytes: 1 %
Lymphocytes Relative: 28 %
Lymphs Abs: 0.3 10*3/uL — ABNORMAL LOW (ref 0.7–4.0)
MCH: 32.9 pg (ref 26.0–34.0)
MCHC: 33 g/dL (ref 30.0–36.0)
MCV: 99.7 fL (ref 80.0–100.0)
Monocytes Absolute: 0.3 10*3/uL (ref 0.1–1.0)
Monocytes Relative: 22 %
Neutro Abs: 0.6 10*3/uL — ABNORMAL LOW (ref 1.7–7.7)
Neutrophils Relative %: 47 %
Platelet Count: 204 10*3/uL (ref 150–400)
RBC: 3.19 MIL/uL — ABNORMAL LOW (ref 4.22–5.81)
RDW: 14.6 % (ref 11.5–15.5)
WBC Count: 1.2 10*3/uL — ABNORMAL LOW (ref 4.0–10.5)
nRBC: 1.7 % — ABNORMAL HIGH (ref 0.0–0.2)

## 2019-06-08 LAB — CMP (CANCER CENTER ONLY)
ALT: 51 U/L — ABNORMAL HIGH (ref 0–44)
AST: 37 U/L (ref 15–41)
Albumin: 3.6 g/dL (ref 3.5–5.0)
Alkaline Phosphatase: 83 U/L (ref 38–126)
Anion gap: 11 (ref 5–15)
BUN: 17 mg/dL (ref 8–23)
CO2: 24 mmol/L (ref 22–32)
Calcium: 9.4 mg/dL (ref 8.9–10.3)
Chloride: 106 mmol/L (ref 98–111)
Creatinine: 0.96 mg/dL (ref 0.61–1.24)
GFR, Est AFR Am: >60 mL/min (ref >60)
GFR, Estimated: >60 mL/min (ref >60)
Glucose, Bld: 93 mg/dL (ref 70–99)
Potassium: 4.3 mmol/L (ref 3.5–5.1)
Sodium: 141 mmol/L (ref 135–145)
Total Bilirubin: 0.3 mg/dL (ref 0.3–1.2)
Total Protein: 7.1 g/dL (ref 6.5–8.1)

## 2019-06-13 ENCOUNTER — Other Ambulatory Visit: Payer: Self-pay | Admitting: Cardiovascular Disease

## 2019-06-15 ENCOUNTER — Other Ambulatory Visit: Payer: Self-pay

## 2019-06-15 ENCOUNTER — Inpatient Hospital Stay: Payer: Medicare Other

## 2019-06-15 DIAGNOSIS — Z5112 Encounter for antineoplastic immunotherapy: Secondary | ICD-10-CM | POA: Diagnosis not present

## 2019-06-15 DIAGNOSIS — C3412 Malignant neoplasm of upper lobe, left bronchus or lung: Secondary | ICD-10-CM

## 2019-06-15 LAB — CMP (CANCER CENTER ONLY)
ALT: 37 U/L (ref 0–44)
AST: 24 U/L (ref 15–41)
Albumin: 3.7 g/dL (ref 3.5–5.0)
Alkaline Phosphatase: 78 U/L (ref 38–126)
Anion gap: 11 (ref 5–15)
BUN: 19 mg/dL (ref 8–23)
CO2: 23 mmol/L (ref 22–32)
Calcium: 9.4 mg/dL (ref 8.9–10.3)
Chloride: 109 mmol/L (ref 98–111)
Creatinine: 0.99 mg/dL (ref 0.61–1.24)
GFR, Est AFR Am: >60 mL/min (ref >60)
GFR, Estimated: >60 mL/min (ref >60)
Glucose, Bld: 134 mg/dL — ABNORMAL HIGH (ref 70–99)
Potassium: 4 mmol/L (ref 3.5–5.1)
Sodium: 143 mmol/L (ref 135–145)
Total Bilirubin: 0.2 mg/dL — ABNORMAL LOW (ref 0.3–1.2)
Total Protein: 7.1 g/dL (ref 6.5–8.1)

## 2019-06-15 LAB — CBC WITH DIFFERENTIAL (CANCER CENTER ONLY)
Abs Immature Granulocytes: 0.02 10*3/uL (ref 0.00–0.07)
Basophils Absolute: 0 10*3/uL (ref 0.0–0.1)
Basophils Relative: 0 %
Eosinophils Absolute: 0 10*3/uL (ref 0.0–0.5)
Eosinophils Relative: 1 %
HCT: 31.9 % — ABNORMAL LOW (ref 39.0–52.0)
Hemoglobin: 10.5 g/dL — ABNORMAL LOW (ref 13.0–17.0)
Immature Granulocytes: 1 %
Lymphocytes Relative: 9 %
Lymphs Abs: 0.3 10*3/uL — ABNORMAL LOW (ref 0.7–4.0)
MCH: 33.2 pg (ref 26.0–34.0)
MCHC: 32.9 g/dL (ref 30.0–36.0)
MCV: 100.9 fL — ABNORMAL HIGH (ref 80.0–100.0)
Monocytes Absolute: 0.7 10*3/uL (ref 0.1–1.0)
Monocytes Relative: 18 %
Neutro Abs: 2.7 10*3/uL (ref 1.7–7.7)
Neutrophils Relative %: 71 %
Platelet Count: 203 10*3/uL (ref 150–400)
RBC: 3.16 MIL/uL — ABNORMAL LOW (ref 4.22–5.81)
RDW: 16.2 % — ABNORMAL HIGH (ref 11.5–15.5)
WBC Count: 3.7 10*3/uL — ABNORMAL LOW (ref 4.0–10.5)
nRBC: 0 % (ref 0.0–0.2)

## 2019-06-15 LAB — TSH: TSH: 0.637 u[IU]/mL (ref 0.320–4.118)

## 2019-06-20 ENCOUNTER — Ambulatory Visit (HOSPITAL_COMMUNITY)
Admission: RE | Admit: 2019-06-20 | Discharge: 2019-06-20 | Disposition: A | Discharge status: Home / Self Care | Payer: Medicare Other | Source: Ambulatory Visit | Attending: Internal Medicine | Admitting: Internal Medicine

## 2019-06-20 ENCOUNTER — Other Ambulatory Visit: Payer: Self-pay

## 2019-06-20 ENCOUNTER — Other Ambulatory Visit: Payer: Self-pay | Admitting: *Deleted

## 2019-06-20 DIAGNOSIS — C3490 Malignant neoplasm of unspecified part of unspecified bronchus or lung: Secondary | ICD-10-CM | POA: Diagnosis present

## 2019-06-20 MED ORDER — FOLIC ACID 1 MG PO TABS: 1.0000 mg | ORAL_TABLET | Freq: Every day | ORAL | 2 refills | Status: DC

## 2019-06-20 MED ORDER — SODIUM CHLORIDE (PF) 0.9 % IJ SOLN
INTRAMUSCULAR | Status: AC
  Filled 2019-06-20: qty 50

## 2019-06-20 MED ORDER — IOHEXOL 300 MG/ML  SOLN
100.0000 mL | Freq: Once | INTRAMUSCULAR | Status: AC | PRN
  Administered 2019-06-20: 100 mL via INTRAVENOUS

## 2019-06-22 ENCOUNTER — Inpatient Hospital Stay: Payer: Medicare Other

## 2019-06-22 ENCOUNTER — Encounter: Payer: Self-pay | Admitting: Internal Medicine

## 2019-06-22 ENCOUNTER — Inpatient Hospital Stay: Payer: Medicare Other | Attending: Internal Medicine | Admitting: Internal Medicine

## 2019-06-22 ENCOUNTER — Other Ambulatory Visit: Payer: Self-pay

## 2019-06-22 VITALS — BP 131/78 | HR 72 | Temp 97.9°F | Resp 17 | Ht 69.0 in | Wt 195.9 lb

## 2019-06-22 DIAGNOSIS — Z5111 Encounter for antineoplastic chemotherapy: Secondary | ICD-10-CM | POA: Diagnosis present

## 2019-06-22 DIAGNOSIS — Z5112 Encounter for antineoplastic immunotherapy: Secondary | ICD-10-CM

## 2019-06-22 DIAGNOSIS — I10 Essential (primary) hypertension: Secondary | ICD-10-CM

## 2019-06-22 DIAGNOSIS — C3412 Malignant neoplasm of upper lobe, left bronchus or lung: Secondary | ICD-10-CM | POA: Diagnosis present

## 2019-06-22 DIAGNOSIS — Z79899 Other long term (current) drug therapy: Secondary | ICD-10-CM | POA: Insufficient documentation

## 2019-06-22 DIAGNOSIS — C7971 Secondary malignant neoplasm of right adrenal gland: Secondary | ICD-10-CM | POA: Insufficient documentation

## 2019-06-22 LAB — CMP (CANCER CENTER ONLY)
ALT: 41 U/L (ref 0–44)
AST: 28 U/L (ref 15–41)
Albumin: 3.8 g/dL (ref 3.5–5.0)
Alkaline Phosphatase: 84 U/L (ref 38–126)
Anion gap: 12 (ref 5–15)
BUN: 14 mg/dL (ref 8–23)
CO2: 26 mmol/L (ref 22–32)
Calcium: 9.9 mg/dL (ref 8.9–10.3)
Chloride: 107 mmol/L (ref 98–111)
Creatinine: 0.96 mg/dL (ref 0.61–1.24)
GFR, Est AFR Am: >60 mL/min (ref >60)
GFR, Estimated: >60 mL/min (ref >60)
Glucose, Bld: 125 mg/dL — ABNORMAL HIGH (ref 70–99)
Potassium: 3.6 mmol/L (ref 3.5–5.1)
Sodium: 145 mmol/L (ref 135–145)
Total Bilirubin: 0.3 mg/dL (ref 0.3–1.2)
Total Protein: 7.5 g/dL (ref 6.5–8.1)

## 2019-06-22 LAB — CBC WITH DIFFERENTIAL (CANCER CENTER ONLY)
Abs Immature Granulocytes: 0 10*3/uL (ref 0.00–0.07)
Basophils Absolute: 0 10*3/uL (ref 0.0–0.1)
Basophils Relative: 1 %
Eosinophils Absolute: 0.1 10*3/uL (ref 0.0–0.5)
Eosinophils Relative: 2 %
HCT: 35.5 % — ABNORMAL LOW (ref 39.0–52.0)
Hemoglobin: 11.7 g/dL — ABNORMAL LOW (ref 13.0–17.0)
Immature Granulocytes: 0 %
Lymphocytes Relative: 17 %
Lymphs Abs: 0.5 10*3/uL — ABNORMAL LOW (ref 0.7–4.0)
MCH: 34 pg (ref 26.0–34.0)
MCHC: 33 g/dL (ref 30.0–36.0)
MCV: 103.2 fL — ABNORMAL HIGH (ref 80.0–100.0)
Monocytes Absolute: 0.6 10*3/uL (ref 0.1–1.0)
Monocytes Relative: 23 %
Neutro Abs: 1.6 10*3/uL — ABNORMAL LOW (ref 1.7–7.7)
Neutrophils Relative %: 57 %
Platelet Count: 262 10*3/uL (ref 150–400)
RBC: 3.44 MIL/uL — ABNORMAL LOW (ref 4.22–5.81)
RDW: 16.8 % — ABNORMAL HIGH (ref 11.5–15.5)
WBC Count: 2.7 10*3/uL — ABNORMAL LOW (ref 4.0–10.5)
nRBC: 0 % (ref 0.0–0.2)

## 2019-06-22 MED ORDER — PALONOSETRON HCL INJECTION 0.25 MG/5ML
0.2500 mg | Freq: Once | INTRAVENOUS | Status: AC
  Administered 2019-06-22: 11:00:00 0.25 mg via INTRAVENOUS

## 2019-06-22 MED ORDER — PALONOSETRON HCL INJECTION 0.25 MG/5ML
INTRAVENOUS | Status: AC
  Filled 2019-06-22: qty 5

## 2019-06-22 MED ORDER — SODIUM CHLORIDE 0.9 % IV SOLN
Freq: Once | INTRAVENOUS | Status: AC
  Filled 2019-06-22: qty 250

## 2019-06-22 MED ORDER — SODIUM CHLORIDE 0.9 % IV SOLN
537.5000 mg | Freq: Once | INTRAVENOUS | Status: AC
  Administered 2019-06-22: 540 mg via INTRAVENOUS
  Filled 2019-06-22: qty 54

## 2019-06-22 MED ORDER — DIPHENHYDRAMINE HCL 50 MG/ML IJ SOLN
25.0000 mg | Freq: Once | INTRAMUSCULAR | Status: AC
  Administered 2019-06-22: 25 mg via INTRAVENOUS

## 2019-06-22 MED ORDER — SODIUM CHLORIDE 0.9 % IV SOLN
200.0000 mg | Freq: Once | INTRAVENOUS | Status: AC
  Administered 2019-06-22: 200 mg via INTRAVENOUS
  Filled 2019-06-22: qty 8

## 2019-06-22 MED ORDER — SODIUM CHLORIDE 0.9 % IV SOLN
500.0000 mg/m2 | Freq: Once | INTRAVENOUS | Status: AC
  Administered 2019-06-22: 1000 mg via INTRAVENOUS
  Filled 2019-06-22: qty 40

## 2019-06-22 MED ORDER — SODIUM CHLORIDE 0.9 % IV SOLN
Freq: Once | INTRAVENOUS | Status: AC
  Filled 2019-06-22: qty 5

## 2019-06-22 MED ORDER — FAMOTIDINE IN NACL 20-0.9 MG/50ML-% IV SOLN
20.0000 mg | Freq: Once | INTRAVENOUS | Status: AC
  Administered 2019-06-22: 20 mg via INTRAVENOUS

## 2019-06-22 MED ORDER — DIPHENHYDRAMINE HCL 50 MG/ML IJ SOLN
INTRAMUSCULAR | Status: AC
  Filled 2019-06-22: qty 1

## 2019-06-22 MED ORDER — FAMOTIDINE IN NACL 20-0.9 MG/50ML-% IV SOLN
INTRAVENOUS | Status: AC
  Filled 2019-06-22: qty 50

## 2019-06-22 MED ORDER — FOLIC ACID 1 MG PO TABS: 1.0000 mg | ORAL_TABLET | Freq: Every day | ORAL | 1 refills | Status: DC

## 2019-06-22 NOTE — Progress Notes (Signed)
Trevor Bailey [phone]   Fax:(336) [phone]  OFFICE PROGRESS NOTE  [name], MD [address] [place] [address]  DIAGNOSIS:  Stage IV (T1b, N3, M1c) non-small cell lung cancer, adenocarcinoma. 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  Biomarker Findings Tumor Mutational Burden - 10 Muts/Mb Microsatellite status - MS-Stable Genomic Findings For a complete list of the genes assayed, please refer to the Appendix. ERBB2 amplification - equivocal? OFB51 W25* FANCC splice site 852+7P>O SMAD4 Q289* TP53 E298* 7 Disease relevant genes with no reportable alterations: ALK, BRAF, EGFR, KRAS, MET, RET, ROS1   PDL1 expression is 30%.  PRIOR THERAPY: 1) Weekly concurrent chemoradiation with carboplatin for an AUC of 2 and paclitaxel 45 mg/m2 to the locally advanced disease in the chest. He started the first dose 01/31/2019. Status post7cycles.Last dose of chemotherapy was given on March 14, 2019. 2) palliative radiotherapy to the metastatic disease in the right adrenal gland.  CURRENT THERAPY: Systemic chemotherapy with carboplatin for AUC of 5, Alimta 500 mg/M2 and Keytruda 200 mg IV every 3 weeks. First dose April 20, 2019. Status post 3 cycles.   INTERVAL HISTORY: Trevor Bailey 74 y.o. male returns to the clinic today for follow-up visit.  The patient is feeling fine today with no concerning complaints.  He denied having any chest pain, shortness of breath, cough or hemoptysis.  He denied having any fever or chills.  He has no nausea, vomiting, diarrhea or constipation.  He continues to tolerate his treatment with carboplatin, Alimta and Keytruda fairly well.  The patient had repeat CT scan of the chest, abdomen pelvis performed recently and he is here for  evaluation and discussion of his scan results.   MEDICAL HISTORY: Past Medical History:  Diagnosis Date  . Adenopathy    left supraclavicular lymph node  . Allergies   . COPD (chronic obstructive pulmonary disease) ([place])   . Coronary disease   . Diabetes ([place])   . Enlarged prostate   . GERD (gastroesophageal reflux disease)   . Glaucoma   . Hyperlipidemia   . Hypertension   . Myocardial infarction ([place])   . Sleep apnea    wears CPAP  . Wears partial dentures    upper and lower    ALLERGIES:  has No Known Allergies.  MEDICATIONS:  Current Outpatient Medications  Medication Sig Dispense Refill  . amLODipine (NORVASC) 5 MG tablet TAKE 1 TABLET BY MOUTH  DAILY 90 tablet 1  . aspirin EC 81 MG tablet Take 81 mg by mouth daily.    Marland Kitchen atorvastatin (LIPITOR) 80 MG tablet Take 1 tablet (80 mg total) by mouth daily. 90 tablet 3  . augmented betamethasone dipropionate (DIPROLENE-AF) 0.05 % cream     . CLARITIN 10 MG tablet Take 10 mg by mouth daily.     . clopidogrel (PLAVIX) 75 MG tablet Take 1 tablet (75 mg total) by mouth every evening. 30 tablet 0  . finasteride (PROSCAR) 5 MG tablet Take 5 mg by mouth daily.     . fluconazole (DIFLUCAN) 100 MG tablet Take 1 tablet (100 mg total) by mouth daily. 7 tablet 0  . folic acid (FOLVITE) 1 MG tablet Take 1 tablet (1 mg total) by mouth daily. 30 tablet 2  . hydrochlorothiazide (HYDRODIURIL) 25 MG tablet Take 25  mg by mouth daily.     Marland Kitchen latanoprost (XALATAN) 0.005 % ophthalmic solution Place 1 drop into both eyes at bedtime.     . metFORMIN (GLUCOPHAGE) 1000 MG tablet Take 1,000 mg by mouth 2 (two) times daily.     . metoprolol tartrate (LOPRESSOR) 25 MG tablet Take 25 mg 2 (two) times daily by mouth.    . Multiple Vitamin (MULTIVITAMIN WITH MINERALS) TABS tablet Take 1 tablet by mouth daily. Centrum Silver    . Multiple Vitamins-Minerals (PRESERVISION AREDS 2 PO) Take 1 tablet by mouth 2 (two) times daily.    . nitroGLYCERIN (NITROSTAT) 0.4  MG SL tablet Place 1 tablet (0.4 mg total) under the tongue every 5 (five) minutes as needed for chest pain. 25 tablet 6  . pantoprazole (PROTONIX) 40 MG tablet Take 40 mg daily by mouth.    Vladimir Faster Glycol-Propyl Glycol (SYSTANE) 0.4-0.3 % SOLN Place 1-2 drops into both eyes 3 (three) times daily as needed (dry/irritated eyes.).    Marland Kitchen prochlorperazine (COMPAZINE) 10 MG tablet Take 1 tablet (10 mg total) by mouth every 6 (six) hours as needed for nausea or vomiting. 30 tablet 2  . ramipril (ALTACE) 10 MG capsule Take 10 mg by mouth 2 (two) times daily.     . sucralfate (CARAFATE) 1 g tablet Take 1 tablet (1 g total) by mouth 4 (four) times daily -  with meals and at bedtime. Crush tablet and mix in 1 oz water and take po qAC and HS 120 tablet 1  . traMADol (ULTRAM) 50 MG tablet Take 1 tablet (50 mg total) by mouth every 6 (six) hours as needed. 20 tablet 0  . VENTOLIN HFA 108 (90 Base) MCG/ACT inhaler Inhale 1-2 puffs into the lungs every 6 (six) hours as needed for wheezing or shortness of breath.   0  . ZETIA 10 MG tablet Take 10 mg by mouth every evening.      No current facility-administered medications for this visit.    SURGICAL HISTORY:  Past Surgical History:  Procedure Laterality Date  . ABDOMINAL AORTAGRAM Left 09/16/2013   Procedure: ABDOMINAL Maxcine Ham;  Surgeon: Elam Dutch, MD;  Location: [facility] CATH LAB;  Service: Cardiovascular;  Laterality: Left;  . CARDIAC CATHETERIZATION  01/2011   When there was segmental stenosis of the distal RCA, patent PCA stent, patent circumflex stent, and a patent small diagonal with 90% ISR.   Marland Kitchen COLONOSCOPY W/ BIOPSIES AND POLYPECTOMY    . CORONARY ANGIOPLASTY  may 2002   Non-DES stenting of his circumflex, non-DES stenting of the RCA  . HYDROCELE EXCISION / REPAIR  2009   by Dr Janice Norrie  . INGUINAL HERNIA REPAIR Bilateral    Dr Bubba Camp  . LYMPH NODE BIOPSY Left 01/10/2019   Procedure: left supraclavicular LYMPH NODE BIOPSY;  Surgeon: [name], MD;  Location: [facility];  Service: Thoracic;  Laterality: Left;  Marland Kitchen MULTIPLE TOOTH EXTRACTIONS    . NM MYOCAR PERF WALL MOTION  01/08/2011   moderate in size and intensity area of reversible ischemia in the basal to mid inferior and septal territories. Abnormal study  . stents  2008   proximal RCA, DES for progession of disease.  Marland Kitchen US ECHOCARDIOGRAPHY  01/12/2012   mild concentric LVH, borderline LA enlargement, mild to mod TR    REVIEW OF SYSTEMS:  Constitutional: positive for fatigue Eyes: negative Ears, nose, mouth, throat, and face: negative Respiratory: negative Cardiovascular: negative Gastrointestinal: negative Genitourinary:negative Integument/breast: negative Hematologic/lymphatic: negative Musculoskeletal:negative  Neurological: negative Behavioral/Psych: negative Endocrine: negative Allergic/Immunologic: negative   PHYSICAL EXAMINATION: General appearance: alert, cooperative and no distress Head: Normocephalic, without obvious abnormality, atraumatic Neck: no adenopathy, no JVD, supple, symmetrical, trachea midline and thyroid not enlarged, symmetric, no tenderness/mass/nodules Lymph nodes: Cervical, supraclavicular, and axillary nodes normal. Resp: clear to auscultation bilaterally Back: symmetric, no curvature. ROM normal. No CVA tenderness. Cardio: regular rate and rhythm, S1, S2 normal, no murmur, click, rub or gallop GI: soft, non-tender; bowel sounds normal; no masses,  no organomegaly Extremities: extremities normal, atraumatic, no cyanosis or edema Neurologic: Alert and oriented X 3, normal strength and tone. Normal symmetric reflexes. Normal coordination and gait  ECOG PERFORMANCE STATUS: 1 - Symptomatic but completely ambulatory  Blood pressure 131/78, pulse 72, temperature 97.9 F (36.6 C), temperature source Temporal, resp. rate 17, height '5\' 9"'$  (1.753 m), weight 195 lb 14.4 oz (88.9 kg), SpO2 100 %.  LABORATORY DATA: Lab Results  Component Value  Date   WBC 2.7 (L) 06/22/2019   HGB 11.7 (L) 06/22/2019   HCT 35.5 (L) 06/22/2019   MCV 103.2 (H) 06/22/2019   PLT 262 06/22/2019      Chemistry      Component Value Date/Time   NA 145 06/22/2019 0953   K 3.6 06/22/2019 0953   CL 107 06/22/2019 0953   CO2 26 06/22/2019 0953   BUN 14 06/22/2019 0953   CREATININE 0.96 06/22/2019 0953   CREATININE 0.94 05/16/2014 1109      Component Value Date/Time   CALCIUM 9.9 06/22/2019 0953   ALKPHOS 84 06/22/2019 0953   AST 28 06/22/2019 0953   ALT 41 06/22/2019 0953   BILITOT 0.3 06/22/2019 0953       RADIOGRAPHIC STUDIES: CT Chest W Contrast  Result Date: 06/20/2019 CLINICAL DATA:  Non-small cell lung cancer restaging, status post chemo radiation. EXAM: CT CHEST, ABDOMEN, AND PELVIS WITH CONTRAST TECHNIQUE: Multidetector CT imaging of the chest, abdomen and pelvis was performed following the standard protocol during bolus administration of intravenous contrast. CONTRAST:  160m OMNIPAQUE IOHEXOL 300 MG/ML  SOLN COMPARISON:  Multiple exams, including 04/08/2019 and PET-CT from 12/06/2018 FINDINGS: CT CHEST FINDINGS Cardiovascular: Coronary, aortic arch, and branch vessel atherosclerotic vascular disease. Mediastinum/Nodes: Stable nodule in the thyroid isthmus, 1.0 by 0.8 cm on image 11/2. No followup recommended.(ref: J Am Coll Radiol. 2015 Feb;12(2): 143-50). There continues to be some soft tissue nodularity on the top most image adjacent to the left clavicle, and a set of prior hypermetabolic activity, although the underlying density is notably less striking in size compared to 12/06/2018. Lungs/Pleura: Airway thickening is present, suggesting bronchitis or reactive airways disease. Centrilobular emphysema. Mild scarring both lower lobes. Mild scarring at the lung apices. The nodule posteriorly in the left upper lobe measures 0.7 cm in short axis thickness on image 44/6, previously 0.8 cm. This currently has a more bandlike contour with mildly  reduced nodularity. Accessory fissure in the left upper lobe. Punctate calcified granuloma in the left lower lobe. Faint subpleural nodularity measuring 0.4 by 0.2 cm in the left lower lobe, stable. Small left pleural calcification on image 42/2 appears stable. Musculoskeletal: Considerable degenerative glenohumeral arthropathy on the left. Possible small loose fragment in the left glenohumeral joint. Asymmetric degenerative right sternoclavicular arthropathy. There is accentuated subcutaneous edema along the left upper chest and pectoralis muscle anteriorly. CT ABDOMEN PELVIS FINDINGS Hepatobiliary: Stable scattered hypodense hepatic lesions are likely cysts although some are technically too small to characterize. Gallbladder unremarkable. Pancreas: Unremarkable Spleen: Unremarkable Adrenals/Urinary  Tract: The previous right adrenal mass is no longer readily apparent. The limbs of the right adrenal gland appear sin and reasonably normal although with some subtle marginal stranding. There is no residual measurable mass. Bilateral renal cysts noted. Prostate gland indents the bladder base. Stomach/Bowel: Transverse duodenal diverticulum without signs of inflammation. Proximal sigmoid diverticulosis. Scattered diverticula elsewhere in the colon. Normal appendix. Vascular/Lymphatic: Aortoiliac atherosclerotic vascular disease. No pathologic adenopathy identified. Reproductive: Stable mild prostatomegaly. Other: No supplemental non-categorized findings. Musculoskeletal: Mild dextroconvex lumbar scoliosis with rotary component. Suspected large hemangioma in the L2 vertebral body. Grade 1 degenerative anterolisthesis at L2-3. Lumbar spondylosis and degenerative disc disease causing mild left foraminal stenosis at L3-4 and L4-5, and right foraminal stenosis at L4-5. IMPRESSION: 1. Interval resolution of the right adrenal mass. 2. Further reduction in prominence of the treated left upper lobe lesion, with a less nodular  contour and measuring 7 mm in short axis currently, previously 8 mm. 3. The previous left supraclavicular lesion is only at the extreme upper margin of imaging, this appears subjectively reduced in size from 12/06/2018. Radiation therapy related findings in the adjacent soft tissues. 4. Other imaging findings of potential clinical significance: Airway thickening is present, suggesting bronchitis or reactive airways disease. Hepatic and renal cysts. Transverse duodenal diverticulum. Stable mild prostatomegaly. Lumbar spondylosis and degenerative disc disease causing mild impingement at L3-4 and L4-5 and right foraminal stenosis at L4-5. Aortic Atherosclerosis (ICD10-I70.0) and Emphysema (ICD10-J43.9). Electronically Signed   By: Van Clines M.D.   On: 06/20/2019 12:19   CT Abdomen Pelvis W Contrast  Result Date: 06/20/2019 CLINICAL DATA:  Non-small cell lung cancer restaging, status post chemo radiation. EXAM: CT CHEST, ABDOMEN, AND PELVIS WITH CONTRAST TECHNIQUE: Multidetector CT imaging of the chest, abdomen and pelvis was performed following the standard protocol during bolus administration of intravenous contrast. CONTRAST:  156m OMNIPAQUE IOHEXOL 300 MG/ML  SOLN COMPARISON:  Multiple exams, including 04/08/2019 and PET-CT from 12/06/2018 FINDINGS: CT CHEST FINDINGS Cardiovascular: Coronary, aortic arch, and branch vessel atherosclerotic vascular disease. Mediastinum/Nodes: Stable nodule in the thyroid isthmus, 1.0 by 0.8 cm on image 11/2. No followup recommended.(ref: J Am Coll Radiol. 2015 Feb;12(2): 143-50). There continues to be some soft tissue nodularity on the top most image adjacent to the left clavicle, and a set of prior hypermetabolic activity, although the underlying density is notably less striking in size compared to 12/06/2018. Lungs/Pleura: Airway thickening is present, suggesting bronchitis or reactive airways disease. Centrilobular emphysema. Mild scarring both lower lobes. Mild  scarring at the lung apices. The nodule posteriorly in the left upper lobe measures 0.7 cm in short axis thickness on image 44/6, previously 0.8 cm. This currently has a more bandlike contour with mildly reduced nodularity. Accessory fissure in the left upper lobe. Punctate calcified granuloma in the left lower lobe. Faint subpleural nodularity measuring 0.4 by 0.2 cm in the left lower lobe, stable. Small left pleural calcification on image 42/2 appears stable. Musculoskeletal: Considerable degenerative glenohumeral arthropathy on the left. Possible small loose fragment in the left glenohumeral joint. Asymmetric degenerative right sternoclavicular arthropathy. There is accentuated subcutaneous edema along the left upper chest and pectoralis muscle anteriorly. CT ABDOMEN PELVIS FINDINGS Hepatobiliary: Stable scattered hypodense hepatic lesions are likely cysts although some are technically too small to characterize. Gallbladder unremarkable. Pancreas: Unremarkable Spleen: Unremarkable Adrenals/Urinary Tract: The previous right adrenal mass is no longer readily apparent. The limbs of the right adrenal gland appear sin and reasonably normal although with some subtle marginal stranding. There is  no residual measurable mass. Bilateral renal cysts noted. Prostate gland indents the bladder base. Stomach/Bowel: Transverse duodenal diverticulum without signs of inflammation. Proximal sigmoid diverticulosis. Scattered diverticula elsewhere in the colon. Normal appendix. Vascular/Lymphatic: Aortoiliac atherosclerotic vascular disease. No pathologic adenopathy identified. Reproductive: Stable mild prostatomegaly. Other: No supplemental non-categorized findings. Musculoskeletal: Mild dextroconvex lumbar scoliosis with rotary component. Suspected large hemangioma in the L2 vertebral body. Grade 1 degenerative anterolisthesis at L2-3. Lumbar spondylosis and degenerative disc disease causing mild left foraminal stenosis at L3-4 and  L4-5, and right foraminal stenosis at L4-5. IMPRESSION: 1. Interval resolution of the right adrenal mass. 2. Further reduction in prominence of the treated left upper lobe lesion, with a less nodular contour and measuring 7 mm in short axis currently, previously 8 mm. 3. The previous left supraclavicular lesion is only at the extreme upper margin of imaging, this appears subjectively reduced in size from 12/06/2018. Radiation therapy related findings in the adjacent soft tissues. 4. Other imaging findings of potential clinical significance: Airway thickening is present, suggesting bronchitis or reactive airways disease. Hepatic and renal cysts. Transverse duodenal diverticulum. Stable mild prostatomegaly. Lumbar spondylosis and degenerative disc disease causing mild impingement at L3-4 and L4-5 and right foraminal stenosis at L4-5. Aortic Atherosclerosis (ICD10-I70.0) and Emphysema (ICD10-J43.9). Electronically Signed   By: Van Clines M.D.   On: 06/20/2019 12:19    ASSESSMENT AND PLAN: This is a very pleasant 74 years old African-American male with stage IV non-small cell lung cancer, adenocarcinoma with no actionable mutation and PD-L1 expression of 30%, presented with locally advanced disease in the chest as well as solitary metastasis to the right adrenal gland. The patient completed a course of concurrent chemoradiation to the chest with weekly carboplatin for AUC of 2 and paclitaxel 45 mg/M2 status post 7 cycles..  The patient also received palliative radiotherapy to the adrenal glands. He is currently undergoing systemic chemotherapy with carboplatin, Alimta and Keytruda status post 3 cycles. The patient has been tolerating this treatment well with no concerning adverse effects. He had repeat CT scan of the chest, abdomen pelvis performed recently.  I personally and independently reviewed the scans and discussed the results with the patient today. His scan showed significant improvement of his  disease with resolution of the right adrenal mass as well as decrease in the pulmonary nodules. I recommended for the patient to continue his current treatment with chemotherapy today as planned. Starting from cycle #5 he will be treated with maintenance Alimta and Keytruda every 3 weeks. I will give the patient a refill of folic acid today. He will come back for follow-up visit in 3 weeks for evaluation before the next cycle of his treatment. He was advised to call immediately if he has any concerning symptoms in the interval. The patient voices understanding of current disease status and treatment options and is in agreement with the current care plan.  All questions were answered. The patient knows to call the clinic with any problems, questions or concerns. We can certainly see the patient much sooner if necessary.   Disclaimer: This note was dictated with voice recognition software. Similar sounding words can inadvertently be transcribed and may not be corrected upon review.

## 2019-06-22 NOTE — Patient Instructions (Signed)
[facility] Discharge Instructions for Patients Receiving Chemotherapy  Today you received the following chemotherapy agents Keytruda, Alimta and Carboplatin   To help prevent nausea and vomiting after your treatment, we encourage you to take your nausea medication as directed.    If you develop nausea and vomiting that is not controlled by your nausea medication, call the clinic.   BELOW ARE SYMPTOMS THAT SHOULD BE REPORTED IMMEDIATELY:  *FEVER GREATER THAN 100.5 F  *CHILLS WITH OR WITHOUT FEVER  NAUSEA AND VOMITING THAT IS NOT CONTROLLED WITH YOUR NAUSEA MEDICATION  *UNUSUAL SHORTNESS OF BREATH  *UNUSUAL BRUISING OR BLEEDING  TENDERNESS IN MOUTH AND THROAT WITH OR WITHOUT PRESENCE OF ULCERS  *URINARY PROBLEMS  *BOWEL PROBLEMS  UNUSUAL RASH Items with * indicate a potential emergency and should be followed up as soon as possible.  Feel free to call the clinic should you have any questions or concerns. The clinic phone number is [phone].  Please show the [place] at check-in to the Emergency Department and triage nurse.

## 2019-06-24 ENCOUNTER — Telehealth: Payer: Self-pay | Admitting: *Deleted

## 2019-06-24 ENCOUNTER — Telehealth: Payer: Self-pay | Admitting: Medical Oncology

## 2019-06-24 NOTE — Telephone Encounter (Signed)
Refaxed note from 06/22/19.

## 2019-06-24 NOTE — Telephone Encounter (Signed)
Called pt back & he was concerned about labs ordered for 2/10, 2/17.  He thought that Dr Julien Nordmann said he wouldn't need any more labs.  Informed that he probably would need labs but orders that I see are for every q3 weeks. Informed that we would clarify with Dr Julien Nordmann & get back with him next week.

## 2019-06-24 NOTE — Telephone Encounter (Signed)
-----   Message from Ardeen Garland, RN sent at 06/24/2019  2:15 PM EST ----- Regarding: appt Contact: [phone] Can you call him -he left a message about appt on 2/10

## 2019-06-25 NOTE — Telephone Encounter (Signed)
Yes lab every 3 weeks but after the next cycle. This cycle he needs weekly lab. Thank you

## 2019-06-27 NOTE — Telephone Encounter (Signed)
I called pt & informed of weekly labs for now & then every three weeks with next cycle.

## 2019-06-29 ENCOUNTER — Other Ambulatory Visit: Payer: Self-pay

## 2019-06-29 ENCOUNTER — Inpatient Hospital Stay: Payer: Medicare Other

## 2019-06-29 DIAGNOSIS — C3412 Malignant neoplasm of upper lobe, left bronchus or lung: Secondary | ICD-10-CM

## 2019-06-29 DIAGNOSIS — Z5112 Encounter for antineoplastic immunotherapy: Secondary | ICD-10-CM | POA: Diagnosis not present

## 2019-06-29 LAB — CBC WITH DIFFERENTIAL (CANCER CENTER ONLY)
Abs Immature Granulocytes: 0 10*3/uL (ref 0.00–0.07)
Basophils Absolute: 0 10*3/uL (ref 0.0–0.1)
Basophils Relative: 1 %
Eosinophils Absolute: 0 10*3/uL (ref 0.0–0.5)
Eosinophils Relative: 1 %
HCT: 32.1 % — ABNORMAL LOW (ref 39.0–52.0)
Hemoglobin: 10.6 g/dL — ABNORMAL LOW (ref 13.0–17.0)
Immature Granulocytes: 0 %
Lymphocytes Relative: 18 %
Lymphs Abs: 0.2 10*3/uL — ABNORMAL LOW (ref 0.7–4.0)
MCH: 33.8 pg (ref 26.0–34.0)
MCHC: 33 g/dL (ref 30.0–36.0)
MCV: 102.2 fL — ABNORMAL HIGH (ref 80.0–100.0)
Monocytes Absolute: 0.2 10*3/uL (ref 0.1–1.0)
Monocytes Relative: 12 %
Neutro Abs: 0.9 10*3/uL — ABNORMAL LOW (ref 1.7–7.7)
Neutrophils Relative %: 68 %
Platelet Count: 148 10*3/uL — ABNORMAL LOW (ref 150–400)
RBC: 3.14 MIL/uL — ABNORMAL LOW (ref 4.22–5.81)
RDW: 14.9 % (ref 11.5–15.5)
WBC Count: 1.2 10*3/uL — ABNORMAL LOW (ref 4.0–10.5)
nRBC: 0 % (ref 0.0–0.2)

## 2019-06-29 LAB — CMP (CANCER CENTER ONLY)
ALT: 37 U/L (ref 0–44)
AST: 33 U/L (ref 15–41)
Albumin: 3.5 g/dL (ref 3.5–5.0)
Alkaline Phosphatase: 87 U/L (ref 38–126)
Anion gap: 12 (ref 5–15)
BUN: 21 mg/dL (ref 8–23)
CO2: 24 mmol/L (ref 22–32)
Calcium: 9.4 mg/dL (ref 8.9–10.3)
Chloride: 104 mmol/L (ref 98–111)
Creatinine: 1.12 mg/dL (ref 0.61–1.24)
GFR, Est AFR Am: >60 mL/min (ref >60)
GFR, Estimated: >60 mL/min (ref >60)
Glucose, Bld: 102 mg/dL — ABNORMAL HIGH (ref 70–99)
Potassium: 4.1 mmol/L (ref 3.5–5.1)
Sodium: 140 mmol/L (ref 135–145)
Total Bilirubin: 0.5 mg/dL (ref 0.3–1.2)
Total Protein: 7.3 g/dL (ref 6.5–8.1)

## 2019-07-06 ENCOUNTER — Inpatient Hospital Stay: Payer: Medicare Other

## 2019-07-06 ENCOUNTER — Other Ambulatory Visit: Payer: Self-pay

## 2019-07-06 DIAGNOSIS — C3412 Malignant neoplasm of upper lobe, left bronchus or lung: Secondary | ICD-10-CM

## 2019-07-06 DIAGNOSIS — Z5112 Encounter for antineoplastic immunotherapy: Secondary | ICD-10-CM | POA: Diagnosis not present

## 2019-07-06 LAB — CBC WITH DIFFERENTIAL (CANCER CENTER ONLY)
Abs Immature Granulocytes: 0 10*3/uL (ref 0.00–0.07)
Basophils Absolute: 0 10*3/uL (ref 0.0–0.1)
Basophils Relative: 0 %
Eosinophils Absolute: 0 10*3/uL (ref 0.0–0.5)
Eosinophils Relative: 0 %
HCT: 29.9 % — ABNORMAL LOW (ref 39.0–52.0)
Hemoglobin: 10.2 g/dL — ABNORMAL LOW (ref 13.0–17.0)
Immature Granulocytes: 0 %
Lymphocytes Relative: 12 %
Lymphs Abs: 0.3 10*3/uL — ABNORMAL LOW (ref 0.7–4.0)
MCH: 34.2 pg — ABNORMAL HIGH (ref 26.0–34.0)
MCHC: 34.1 g/dL (ref 30.0–36.0)
MCV: 100.3 fL — ABNORMAL HIGH (ref 80.0–100.0)
Monocytes Absolute: 0.6 10*3/uL (ref 0.1–1.0)
Monocytes Relative: 24 %
Neutro Abs: 1.6 10*3/uL — ABNORMAL LOW (ref 1.7–7.7)
Neutrophils Relative %: 64 %
Platelet Count: 110 10*3/uL — ABNORMAL LOW (ref 150–400)
RBC: 2.98 MIL/uL — ABNORMAL LOW (ref 4.22–5.81)
RDW: 14.9 % (ref 11.5–15.5)
WBC Count: 2.4 10*3/uL — ABNORMAL LOW (ref 4.0–10.5)
nRBC: 0 % (ref 0.0–0.2)

## 2019-07-06 LAB — CMP (CANCER CENTER ONLY)
ALT: 51 U/L — ABNORMAL HIGH (ref 0–44)
AST: 35 U/L (ref 15–41)
Albumin: 3.6 g/dL (ref 3.5–5.0)
Alkaline Phosphatase: 81 U/L (ref 38–126)
Anion gap: 12 (ref 5–15)
BUN: 14 mg/dL (ref 8–23)
CO2: 23 mmol/L (ref 22–32)
Calcium: 9.9 mg/dL (ref 8.9–10.3)
Chloride: 106 mmol/L (ref 98–111)
Creatinine: 1.09 mg/dL (ref 0.61–1.24)
GFR, Est AFR Am: >60 mL/min (ref >60)
GFR, Estimated: >60 mL/min (ref >60)
Glucose, Bld: 175 mg/dL — ABNORMAL HIGH (ref 70–99)
Potassium: 4 mmol/L (ref 3.5–5.1)
Sodium: 141 mmol/L (ref 135–145)
Total Bilirubin: <0.2 mg/dL — ABNORMAL LOW (ref 0.3–1.2)
Total Protein: 7.2 g/dL (ref 6.5–8.1)

## 2019-07-06 LAB — TSH: TSH: 0.712 u[IU]/mL (ref 0.320–4.118)

## 2019-07-06 NOTE — Progress Notes (Signed)
  Radiation Oncology         [phone] ________________________________  Name: Trevor Bailey MRN: [id number]  Date: 04/07/2019  DOB: 25-Dec-1945  End of Treatment Note  Diagnosis:  Lung cancer     Indication for treatment::  curative       Radiation treatment dates:   01/31/19 - 04/07/19  Site/dose:    1.  The patient was treated to the disease within the left lung initially to a dose of 60 Gy using a 4 field, 3-D conformal technique. The patient then received a cone down boost treatment for an additional 6 Gy. This yielded a final total dose of 66 Gy.   2.  The right adrenal was treated with a SBRT technique to a dose of 50 Gy in 5 fractions.  Narrative: The patient tolerated radiation treatment relatively well.   The patient did experience esophagitis during the course of treatment which required management.   Plan: The patient has completed radiation treatment. The patient will return to radiation oncology clinic for routine followup in one month. I advised the patient to call or return sooner if they have any questions or concerns related to their recovery or treatment. ________________________________  Jodelle Gross, M.D., Ph.D.

## 2019-07-13 ENCOUNTER — Encounter: Payer: Self-pay | Admitting: Internal Medicine

## 2019-07-13 ENCOUNTER — Inpatient Hospital Stay: Payer: Medicare Other

## 2019-07-13 ENCOUNTER — Other Ambulatory Visit: Payer: Self-pay

## 2019-07-13 ENCOUNTER — Inpatient Hospital Stay: Payer: Medicare Other | Admitting: Internal Medicine

## 2019-07-13 VITALS — BP 131/74 | HR 79 | Temp 98.0°F | Resp 18 | Ht 69.0 in | Wt 191.4 lb

## 2019-07-13 DIAGNOSIS — I10 Essential (primary) hypertension: Secondary | ICD-10-CM

## 2019-07-13 DIAGNOSIS — Z5111 Encounter for antineoplastic chemotherapy: Secondary | ICD-10-CM

## 2019-07-13 DIAGNOSIS — C3412 Malignant neoplasm of upper lobe, left bronchus or lung: Secondary | ICD-10-CM

## 2019-07-13 DIAGNOSIS — Z5112 Encounter for antineoplastic immunotherapy: Secondary | ICD-10-CM | POA: Diagnosis not present

## 2019-07-13 LAB — CBC WITH DIFFERENTIAL (CANCER CENTER ONLY)
Abs Immature Granulocytes: 0.01 10*3/uL (ref 0.00–0.07)
Basophils Absolute: 0 10*3/uL (ref 0.0–0.1)
Basophils Relative: 0 %
Eosinophils Absolute: 0 10*3/uL (ref 0.0–0.5)
Eosinophils Relative: 1 %
HCT: 31.8 % — ABNORMAL LOW (ref 39.0–52.0)
Hemoglobin: 10.5 g/dL — ABNORMAL LOW (ref 13.0–17.0)
Immature Granulocytes: 0 %
Lymphocytes Relative: 18 %
Lymphs Abs: 0.5 10*3/uL — ABNORMAL LOW (ref 0.7–4.0)
MCH: 34.3 pg — ABNORMAL HIGH (ref 26.0–34.0)
MCHC: 33 g/dL (ref 30.0–36.0)
MCV: 103.9 fL — ABNORMAL HIGH (ref 80.0–100.0)
Monocytes Absolute: 0.8 10*3/uL (ref 0.1–1.0)
Monocytes Relative: 27 %
Neutro Abs: 1.5 10*3/uL — ABNORMAL LOW (ref 1.7–7.7)
Neutrophils Relative %: 54 %
Platelet Count: 192 10*3/uL (ref 150–400)
RBC: 3.06 MIL/uL — ABNORMAL LOW (ref 4.22–5.81)
RDW: 16.3 % — ABNORMAL HIGH (ref 11.5–15.5)
WBC Count: 2.9 10*3/uL — ABNORMAL LOW (ref 4.0–10.5)
nRBC: 0 % (ref 0.0–0.2)

## 2019-07-13 LAB — CMP (CANCER CENTER ONLY)
ALT: 30 U/L (ref 0–44)
AST: 28 U/L (ref 15–41)
Albumin: 3.7 g/dL (ref 3.5–5.0)
Alkaline Phosphatase: 89 U/L (ref 38–126)
Anion gap: 13 (ref 5–15)
BUN: 17 mg/dL (ref 8–23)
CO2: 24 mmol/L (ref 22–32)
Calcium: 9.5 mg/dL (ref 8.9–10.3)
Chloride: 106 mmol/L (ref 98–111)
Creatinine: 1 mg/dL (ref 0.61–1.24)
GFR, Est AFR Am: >60 mL/min (ref >60)
GFR, Estimated: >60 mL/min (ref >60)
Glucose, Bld: 123 mg/dL — ABNORMAL HIGH (ref 70–99)
Potassium: 3.8 mmol/L (ref 3.5–5.1)
Sodium: 143 mmol/L (ref 135–145)
Total Bilirubin: 0.3 mg/dL (ref 0.3–1.2)
Total Protein: 7.3 g/dL (ref 6.5–8.1)

## 2019-07-13 MED ORDER — PROCHLORPERAZINE MALEATE 10 MG PO TABS
ORAL_TABLET | ORAL | Status: AC
  Filled 2019-07-13: qty 1

## 2019-07-13 MED ORDER — SODIUM CHLORIDE 0.9 % IV SOLN
Freq: Once | INTRAVENOUS | Status: AC
  Filled 2019-07-13: qty 250

## 2019-07-13 MED ORDER — SODIUM CHLORIDE 0.9 % IV SOLN
500.0000 mg/m2 | Freq: Once | INTRAVENOUS | Status: AC
  Administered 2019-07-13: 12:00:00 1000 mg via INTRAVENOUS
  Filled 2019-07-13: qty 40

## 2019-07-13 MED ORDER — PROCHLORPERAZINE MALEATE 10 MG PO TABS
10.0000 mg | ORAL_TABLET | Freq: Once | ORAL | Status: AC
  Administered 2019-07-13: 10:00:00 10 mg via ORAL

## 2019-07-13 MED ORDER — SODIUM CHLORIDE 0.9 % IV SOLN
200.0000 mg | Freq: Once | INTRAVENOUS | Status: AC
  Administered 2019-07-13: 200 mg via INTRAVENOUS
  Filled 2019-07-13: qty 8

## 2019-07-13 NOTE — Patient Instructions (Signed)
[facility] Discharge Instructions for Patients Receiving Chemotherapy  Today you received the following chemotherapy agents: pembrolizumab and pemetrexed.  To help prevent nausea and vomiting after your treatment, we encourage you to take your nausea medication as directed.   If you develop nausea and vomiting that is not controlled by your nausea medication, call the clinic.   BELOW ARE SYMPTOMS THAT SHOULD BE REPORTED IMMEDIATELY:  *FEVER GREATER THAN 100.5 F  *CHILLS WITH OR WITHOUT FEVER  NAUSEA AND VOMITING THAT IS NOT CONTROLLED WITH YOUR NAUSEA MEDICATION  *UNUSUAL SHORTNESS OF BREATH  *UNUSUAL BRUISING OR BLEEDING  TENDERNESS IN MOUTH AND THROAT WITH OR WITHOUT PRESENCE OF ULCERS  *URINARY PROBLEMS  *BOWEL PROBLEMS  UNUSUAL RASH Items with * indicate a potential emergency and should be followed up as soon as possible.  Feel free to call the clinic should you have any questions or concerns. The clinic phone number is [phone].  Please show the [facility] at check-in to the Emergency Department and triage nurse.

## 2019-07-13 NOTE — Progress Notes (Signed)
[facility] Telephone:(336) [phone]   Fax:(336) [phone]  OFFICE PROGRESS NOTE  [name], MD [address] [place] [address]  DIAGNOSIS:  Stage IV (T1b, N3, M1c) non-small cell lung cancer, adenocarcinoma. 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  Biomarker Findings Tumor Mutational Burden - 10 Muts/Mb Microsatellite status - MS-Stable Genomic Findings For a complete list of the genes assayed, please refer to the Appendix. ERBB2 amplification - equivocal KDX83 J82* FANCC splice site [id number]>J SMAD4 Q289* TP53 E298* 7 Disease relevant genes with no reportable alterations: ALK, BRAF, EGFR, KRAS, MET, RET, ROS1   PDL1 expression is 30%.  PRIOR THERAPY: 1) Weekly concurrent chemoradiation with carboplatin for an AUC of 2 and paclitaxel 45 mg/m2 to the locally advanced disease in the chest. He started the first dose 01/31/2019. Status post7cycles.Last dose of chemotherapy was given on March 14, 2019. 2) palliative radiotherapy to the metastatic disease in the right adrenal gland.  CURRENT THERAPY: Systemic chemotherapy with carboplatin for AUC of 5, Alimta 500 mg/M2 and Keytruda 200 mg IV every 3 weeks. First dose April 20, 2019. Status post 4 cycles.   INTERVAL HISTORY: Trevor Bailey 74 y.o. male returns to the clinic today for follow-up visit.  The patient is feeling fine today with no concerning complaints.  He tolerated cycle #4 of his treatment fairly well with no concerning adverse effect except for mild fatigue.  He denied having any chest pain, shortness of breath, cough or hemoptysis.  He denied having any fever or chills.  He has no nausea, vomiting, diarrhea or constipation.  He has no headache or visual changes.  He is here today for evaluation before  starting cycle #5.   MEDICAL HISTORY: Past Medical History:  Diagnosis Date   Adenopathy    left supraclavicular lymph node   Allergies    COPD (chronic obstructive pulmonary disease) (HCC)    Coronary disease    Diabetes (HCC)    Enlarged prostate    GERD (gastroesophageal reflux disease)    Glaucoma    Hyperlipidemia    Hypertension    Myocardial infarction ([facility])    Sleep apnea    wears CPAP   Wears partial dentures    upper and lower    ALLERGIES:  has No Known Allergies.  MEDICATIONS:  Current Outpatient Medications  Medication Sig Dispense Refill   OVER THE COUNTER MEDICATION Take 1 tablet by mouth daily as needed. Colace     amLODipine (NORVASC) 5 MG tablet TAKE 1 TABLET BY MOUTH  DAILY 90 tablet 1   aspirin EC 81 MG tablet Take 81 mg by mouth daily.     atorvastatin (LIPITOR) 80 MG tablet Take 1 tablet (80 mg total) by mouth daily. 90 tablet 3   augmented betamethasone dipropionate (DIPROLENE-AF) 0.05 % cream      CLARITIN 10 MG tablet Take 10 mg by mouth daily.      clopidogrel (PLAVIX) 75 MG tablet Take 1 tablet (75 mg total) by mouth every evening. 30 tablet 0   finasteride (PROSCAR) 5 MG tablet Take 5 mg by mouth daily.      folic acid (FOLVITE) 1 MG tablet Take 1 tablet (1 mg total) by mouth daily. 90 tablet 1   hydrochlorothiazide (HYDRODIURIL) 25 MG tablet Take 25 mg by mouth daily.  latanoprost (XALATAN) 0.005 % ophthalmic solution Place 1 drop into both eyes at bedtime.      metFORMIN (GLUCOPHAGE) 1000 MG tablet Take 1,000 mg by mouth 2 (two) times daily.      metoprolol tartrate (LOPRESSOR) 25 MG tablet Take 25 mg 2 (two) times daily by mouth.     Multiple Vitamin (MULTIVITAMIN WITH MINERALS) TABS tablet Take 1 tablet by mouth daily. Centrum Silver     nitroGLYCERIN (NITROSTAT) 0.4 MG SL tablet Place 1 tablet (0.4 mg total) under the tongue every 5 (five) minutes as needed for chest pain. 25 tablet 6   pantoprazole  (PROTONIX) 40 MG tablet Take 40 mg daily by mouth.     Polyethyl Glycol-Propyl Glycol (SYSTANE) 0.4-0.3 % SOLN Place 1-2 drops into both eyes 3 (three) times daily as needed (dry/irritated eyes.).     prochlorperazine (COMPAZINE) 10 MG tablet Take 1 tablet (10 mg total) by mouth every 6 (six) hours as needed for nausea or vomiting. 30 tablet 2   ramipril (ALTACE) 10 MG capsule Take 10 mg by mouth 2 (two) times daily.      traMADol (ULTRAM) 50 MG tablet Take 1 tablet (50 mg total) by mouth every 6 (six) hours as needed. 20 tablet 0   VENTOLIN HFA 108 (90 Base) MCG/ACT inhaler Inhale 1-2 puffs into the lungs every 6 (six) hours as needed for wheezing or shortness of breath.   0   ZETIA 10 MG tablet Take 10 mg by mouth every evening.      No current facility-administered medications for this visit.    SURGICAL HISTORY:  Past Surgical History:  Procedure Laterality Date   ABDOMINAL AORTAGRAM Left 09/16/2013   Procedure: ABDOMINAL AORTAGRAM;  Surgeon: Elam Dutch, MD;  Location[facility] CATH LAB;  Service: Cardiovascular;  Laterality: Left;   CARDIAC CATHETERIZATION  01/2011   When there was segmental stenosis of the distal RCA, patent PCA stent, patent circumflex stent, and a patent small diagonal with 90% ISR.    COLONOSCOPY W/ BIOPSIES AND POLYPECTOMY     CORONARY ANGIOPLASTY  may 2002   Non-DES stenting of his circumflex, non-DES stenting of the RCA   HYDROCELE EXCISION / REPAIR  2009   by Dr Jackquline Bosch HERNIA REPAIR Bilateral    Dr Bubba Camp   LYMPH NODE BIOPSY Left 01/10/2019   Procedure: left supraclavicular LYMPH NODE BIOPSY;  Surgeon: [name], MD;  Location: [facility];  Service: Thoracic;  Laterality: Left;   MULTIPLE TOOTH EXTRACTIONS     NM [place]  01/08/2011   moderate in size and intensity area of reversible ischemia in the basal to mid inferior and septal territories. Abnormal study   stents  2008   proximal RCA, DES for progession of  disease.   US ECHOCARDIOGRAPHY  01/12/2012   mild concentric LVH, borderline LA enlargement, mild to mod TR    REVIEW OF SYSTEMS:  A comprehensive review of systems was negative except for: Constitutional: positive for fatigue   PHYSICAL EXAMINATION: General appearance: alert, cooperative and no distress Head: Normocephalic, without obvious abnormality, atraumatic Neck: no adenopathy, no JVD, supple, symmetrical, trachea midline and thyroid not enlarged, symmetric, no tenderness/mass/nodules Lymph nodes: Cervical, supraclavicular, and axillary nodes normal. Resp: clear to auscultation bilaterally Back: symmetric, no curvature. ROM normal. No CVA tenderness. Cardio: regular rate and rhythm, S1, S2 normal, no murmur, click, rub or gallop GI: soft, non-tender; bowel sounds normal; no masses,  no organomegaly Extremities: extremities normal,  atraumatic, no cyanosis or edema  ECOG PERFORMANCE STATUS: 1 - Symptomatic but completely ambulatory  Blood pressure 131/74, pulse 79, temperature 98 F (36.7 C), temperature source Temporal, resp. rate 18, height '5\' 9"'$  (1.753 m), weight 191 lb 6.4 oz (86.8 kg), SpO2 100 %.  LABORATORY DATA: Lab Results  Component Value Date   WBC 2.9 (L) 07/13/2019   HGB 10.5 (L) 07/13/2019   HCT 31.8 (L) 07/13/2019   MCV 103.9 (H) 07/13/2019   PLT 192 07/13/2019      Chemistry      Component Value Date/Time   NA 143 07/13/2019 0851   K 3.8 07/13/2019 0851   CL 106 07/13/2019 0851   CO2 24 07/13/2019 0851   BUN 17 07/13/2019 0851   CREATININE 1.00 07/13/2019 0851   CREATININE 0.94 05/16/2014 1109      Component Value Date/Time   CALCIUM 9.5 07/13/2019 0851   ALKPHOS 89 07/13/2019 0851   AST 28 07/13/2019 0851   ALT 30 07/13/2019 0851   BILITOT 0.3 07/13/2019 0851       RADIOGRAPHIC STUDIES: CT Chest W Contrast  Result Date: 06/20/2019 CLINICAL DATA:  Non-small cell lung cancer restaging, status post chemo radiation. EXAM: CT CHEST, ABDOMEN, AND  PELVIS WITH CONTRAST TECHNIQUE: Multidetector CT imaging of the chest, abdomen and pelvis was performed following the standard protocol during bolus administration of intravenous contrast. CONTRAST:  113m OMNIPAQUE IOHEXOL 300 MG/ML  SOLN COMPARISON:  Multiple exams, including 04/08/2019 and PET-CT from 12/06/2018 FINDINGS: CT CHEST FINDINGS Cardiovascular: Coronary, aortic arch, and branch vessel atherosclerotic vascular disease. Mediastinum/Nodes: Stable nodule in the thyroid isthmus, 1.0 by 0.8 cm on image 11/2. No followup recommended.(ref: J Am Coll Radiol. 2015 Feb;12(2): 143-50). There continues to be some soft tissue nodularity on the top most image adjacent to the left clavicle, and a set of prior hypermetabolic activity, although the underlying density is notably less striking in size compared to 12/06/2018. Lungs/Pleura: Airway thickening is present, suggesting bronchitis or reactive airways disease. Centrilobular emphysema. Mild scarring both lower lobes. Mild scarring at the lung apices. The nodule posteriorly in the left upper lobe measures 0.7 cm in short axis thickness on image 44/6, previously 0.8 cm. This currently has a more bandlike contour with mildly reduced nodularity. Accessory fissure in the left upper lobe. Punctate calcified granuloma in the left lower lobe. Faint subpleural nodularity measuring 0.4 by 0.2 cm in the left lower lobe, stable. Small left pleural calcification on image 42/2 appears stable. Musculoskeletal: Considerable degenerative glenohumeral arthropathy on the left. Possible small loose fragment in the left glenohumeral joint. Asymmetric degenerative right sternoclavicular arthropathy. There is accentuated subcutaneous edema along the left upper chest and pectoralis muscle anteriorly. CT ABDOMEN PELVIS FINDINGS Hepatobiliary: Stable scattered hypodense hepatic lesions are likely cysts although some are technically too small to characterize. Gallbladder unremarkable.  Pancreas: Unremarkable Spleen: Unremarkable Adrenals/Urinary Tract: The previous right adrenal mass is no longer readily apparent. The limbs of the right adrenal gland appear sin and reasonably normal although with some subtle marginal stranding. There is no residual measurable mass. Bilateral renal cysts noted. Prostate gland indents the bladder base. Stomach/Bowel: Transverse duodenal diverticulum without signs of inflammation. Proximal sigmoid diverticulosis. Scattered diverticula elsewhere in the colon. Normal appendix. Vascular/Lymphatic: Aortoiliac atherosclerotic vascular disease. No pathologic adenopathy identified. Reproductive: Stable mild prostatomegaly. Other: No supplemental non-categorized findings. Musculoskeletal: Mild dextroconvex lumbar scoliosis with rotary component. Suspected large hemangioma in the L2 vertebral body. Grade 1 degenerative anterolisthesis at L2-3. Lumbar spondylosis and degenerative  disc disease causing mild left foraminal stenosis at L3-4 and L4-5, and right foraminal stenosis at L4-5. IMPRESSION: 1. Interval resolution of the right adrenal mass. 2. Further reduction in prominence of the treated left upper lobe lesion, with a less nodular contour and measuring 7 mm in short axis currently, previously 8 mm. 3. The previous left supraclavicular lesion is only at the extreme upper margin of imaging, this appears subjectively reduced in size from 12/06/2018. Radiation therapy related findings in the adjacent soft tissues. 4. Other imaging findings of potential clinical significance: Airway thickening is present, suggesting bronchitis or reactive airways disease. Hepatic and renal cysts. Transverse duodenal diverticulum. Stable mild prostatomegaly. Lumbar spondylosis and degenerative disc disease causing mild impingement at L3-4 and L4-5 and right foraminal stenosis at L4-5. Aortic Atherosclerosis (ICD10-I70.0) and Emphysema (ICD10-J43.9). Electronically Signed   By: Van Clines M.D.   On: 06/20/2019 12:19   CT Abdomen Pelvis W Contrast  Result Date: 06/20/2019 CLINICAL DATA:  Non-small cell lung cancer restaging, status post chemo radiation. EXAM: CT CHEST, ABDOMEN, AND PELVIS WITH CONTRAST TECHNIQUE: Multidetector CT imaging of the chest, abdomen and pelvis was performed following the standard protocol during bolus administration of intravenous contrast. CONTRAST:  124m OMNIPAQUE IOHEXOL 300 MG/ML  SOLN COMPARISON:  Multiple exams, including 04/08/2019 and PET-CT from 12/06/2018 FINDINGS: CT CHEST FINDINGS Cardiovascular: Coronary, aortic arch, and branch vessel atherosclerotic vascular disease. Mediastinum/Nodes: Stable nodule in the thyroid isthmus, 1.0 by 0.8 cm on image 11/2. No followup recommended.(ref: J Am Coll Radiol. 2015 Feb;12(2): 143-50). There continues to be some soft tissue nodularity on the top most image adjacent to the left clavicle, and a set of prior hypermetabolic activity, although the underlying density is notably less striking in size compared to 12/06/2018. Lungs/Pleura: Airway thickening is present, suggesting bronchitis or reactive airways disease. Centrilobular emphysema. Mild scarring both lower lobes. Mild scarring at the lung apices. The nodule posteriorly in the left upper lobe measures 0.7 cm in short axis thickness on image 44/6, previously 0.8 cm. This currently has a more bandlike contour with mildly reduced nodularity. Accessory fissure in the left upper lobe. Punctate calcified granuloma in the left lower lobe. Faint subpleural nodularity measuring 0.4 by 0.2 cm in the left lower lobe, stable. Small left pleural calcification on image 42/2 appears stable. Musculoskeletal: Considerable degenerative glenohumeral arthropathy on the left. Possible small loose fragment in the left glenohumeral joint. Asymmetric degenerative right sternoclavicular arthropathy. There is accentuated subcutaneous edema along the left upper chest and pectoralis  muscle anteriorly. CT ABDOMEN PELVIS FINDINGS Hepatobiliary: Stable scattered hypodense hepatic lesions are likely cysts although some are technically too small to characterize. Gallbladder unremarkable. Pancreas: Unremarkable Spleen: Unremarkable Adrenals/Urinary Tract: The previous right adrenal mass is no longer readily apparent. The limbs of the right adrenal gland appear sin and reasonably normal although with some subtle marginal stranding. There is no residual measurable mass. Bilateral renal cysts noted. Prostate gland indents the bladder base. Stomach/Bowel: Transverse duodenal diverticulum without signs of inflammation. Proximal sigmoid diverticulosis. Scattered diverticula elsewhere in the colon. Normal appendix. Vascular/Lymphatic: Aortoiliac atherosclerotic vascular disease. No pathologic adenopathy identified. Reproductive: Stable mild prostatomegaly. Other: No supplemental non-categorized findings. Musculoskeletal: Mild dextroconvex lumbar scoliosis with rotary component. Suspected large hemangioma in the L2 vertebral body. Grade 1 degenerative anterolisthesis at L2-3. Lumbar spondylosis and degenerative disc disease causing mild left foraminal stenosis at L3-4 and L4-5, and right foraminal stenosis at L4-5. IMPRESSION: 1. Interval resolution of the right adrenal mass. 2. Further reduction in prominence  of the treated left upper lobe lesion, with a less nodular contour and measuring 7 mm in short axis currently, previously 8 mm. 3. The previous left supraclavicular lesion is only at the extreme upper margin of imaging, this appears subjectively reduced in size from 12/06/2018. Radiation therapy related findings in the adjacent soft tissues. 4. Other imaging findings of potential clinical significance: Airway thickening is present, suggesting bronchitis or reactive airways disease. Hepatic and renal cysts. Transverse duodenal diverticulum. Stable mild prostatomegaly. Lumbar spondylosis and degenerative  disc disease causing mild impingement at L3-4 and L4-5 and right foraminal stenosis at L4-5. Aortic Atherosclerosis (ICD10-I70.0) and Emphysema (ICD10-J43.9). Electronically Signed   By: Van Clines M.D.   On: 06/20/2019 12:19    ASSESSMENT AND PLAN: This is a very pleasant 74 years old African-American male with stage IV non-small cell lung cancer, adenocarcinoma with no actionable mutation and PD-L1 expression of 30%, presented with locally advanced disease in the chest as well as solitary metastasis to the right adrenal gland. The patient completed a course of concurrent chemoradiation to the chest with weekly carboplatin for AUC of 2 and paclitaxel 45 mg/M2 status post 7 cycles..  The patient also received palliative radiotherapy to the adrenal glands. He is currently undergoing systemic chemotherapy with carboplatin, Alimta and Keytruda status post 4 cycles. The patient continues to tolerate his treatment well with no concerning adverse effects. I recommended for him to proceed with cycle #5 today as planned.  Starting from cycle #5 he will be on treatment with maintenance Alimta and Keytruda every 3 weeks. I will see the patient back for follow-up visit in 3 weeks for evaluation before the next cycle of his treatment. The patient was advised to call immediately if he has any concerning symptoms in the interval. The patient voices understanding of current disease status and treatment options and is in agreement with the current care plan. All questions were answered. The patient knows to call the clinic with any problems, questions or concerns. We can certainly see the patient much sooner if necessary.   Disclaimer: This note was dictated with voice recognition software. Similar sounding words can inadvertently be transcribed and may not be corrected upon review.

## 2019-07-14 ENCOUNTER — Telehealth: Payer: Self-pay | Admitting: Medical Oncology

## 2019-07-14 ENCOUNTER — Telehealth: Payer: Self-pay | Admitting: Internal Medicine

## 2019-07-14 NOTE — Telephone Encounter (Signed)
Left nostril still bleeding . I instructed pt to contact PCP as he may need to be referred to ENT.

## 2019-07-14 NOTE — Telephone Encounter (Signed)
Scheduled per los. Called and left msg. Mailed printout  °

## 2019-08-01 ENCOUNTER — Other Ambulatory Visit: Payer: Self-pay | Admitting: Medical Oncology

## 2019-08-01 DIAGNOSIS — C3412 Malignant neoplasm of upper lobe, left bronchus or lung: Secondary | ICD-10-CM

## 2019-08-02 ENCOUNTER — Inpatient Hospital Stay: Payer: Medicare Other

## 2019-08-02 ENCOUNTER — Other Ambulatory Visit: Payer: Self-pay

## 2019-08-02 ENCOUNTER — Encounter: Payer: Self-pay | Admitting: Internal Medicine

## 2019-08-02 ENCOUNTER — Inpatient Hospital Stay: Payer: Medicare Other | Attending: Internal Medicine | Admitting: Internal Medicine

## 2019-08-02 VITALS — BP 133/81 | HR 77 | Temp 98.1°F | Resp 18 | Ht 69.0 in | Wt 190.1 lb

## 2019-08-02 DIAGNOSIS — Z79899 Other long term (current) drug therapy: Secondary | ICD-10-CM | POA: Insufficient documentation

## 2019-08-02 DIAGNOSIS — C7971 Secondary malignant neoplasm of right adrenal gland: Secondary | ICD-10-CM | POA: Insufficient documentation

## 2019-08-02 DIAGNOSIS — C3412 Malignant neoplasm of upper lobe, left bronchus or lung: Secondary | ICD-10-CM

## 2019-08-02 DIAGNOSIS — C7989 Secondary malignant neoplasm of other specified sites: Secondary | ICD-10-CM | POA: Diagnosis not present

## 2019-08-02 DIAGNOSIS — Z5112 Encounter for antineoplastic immunotherapy: Secondary | ICD-10-CM

## 2019-08-02 DIAGNOSIS — C3490 Malignant neoplasm of unspecified part of unspecified bronchus or lung: Secondary | ICD-10-CM | POA: Diagnosis not present

## 2019-08-02 DIAGNOSIS — Z5111 Encounter for antineoplastic chemotherapy: Secondary | ICD-10-CM | POA: Diagnosis not present

## 2019-08-02 DIAGNOSIS — I10 Essential (primary) hypertension: Secondary | ICD-10-CM

## 2019-08-02 LAB — CBC WITH DIFFERENTIAL (CANCER CENTER ONLY)
Abs Immature Granulocytes: 0.01 10*3/uL (ref 0.00–0.07)
Basophils Absolute: 0 10*3/uL (ref 0.0–0.1)
Basophils Relative: 1 %
Eosinophils Absolute: 0 10*3/uL (ref 0.0–0.5)
Eosinophils Relative: 1 %
HCT: 31.5 % — ABNORMAL LOW (ref 39.0–52.0)
Hemoglobin: 10.6 g/dL — ABNORMAL LOW (ref 13.0–17.0)
Immature Granulocytes: 0 %
Lymphocytes Relative: 15 %
Lymphs Abs: 0.5 10*3/uL — ABNORMAL LOW (ref 0.7–4.0)
MCH: 34.6 pg — ABNORMAL HIGH (ref 26.0–34.0)
MCHC: 33.7 g/dL (ref 30.0–36.0)
MCV: 102.9 fL — ABNORMAL HIGH (ref 80.0–100.0)
Monocytes Absolute: 0.7 10*3/uL (ref 0.1–1.0)
Monocytes Relative: 19 %
Neutro Abs: 2.2 10*3/uL (ref 1.7–7.7)
Neutrophils Relative %: 64 %
Platelet Count: 270 10*3/uL (ref 150–400)
RBC: 3.06 MIL/uL — ABNORMAL LOW (ref 4.22–5.81)
RDW: 16.1 % — ABNORMAL HIGH (ref 11.5–15.5)
WBC Count: 3.4 10*3/uL — ABNORMAL LOW (ref 4.0–10.5)
nRBC: 0 % (ref 0.0–0.2)

## 2019-08-02 LAB — CMP (CANCER CENTER ONLY)
ALT: 66 U/L — ABNORMAL HIGH (ref 0–44)
AST: 44 U/L — ABNORMAL HIGH (ref 15–41)
Albumin: 3.5 g/dL (ref 3.5–5.0)
Alkaline Phosphatase: 89 U/L (ref 38–126)
Anion gap: 12 (ref 5–15)
BUN: 15 mg/dL (ref 8–23)
CO2: 24 mmol/L (ref 22–32)
Calcium: 9.5 mg/dL (ref 8.9–10.3)
Chloride: 106 mmol/L (ref 98–111)
Creatinine: 0.97 mg/dL (ref 0.61–1.24)
GFR, Est AFR Am: >60 mL/min (ref >60)
GFR, Estimated: >60 mL/min (ref >60)
Glucose, Bld: 129 mg/dL — ABNORMAL HIGH (ref 70–99)
Potassium: 4 mmol/L (ref 3.5–5.1)
Sodium: 142 mmol/L (ref 135–145)
Total Bilirubin: 0.3 mg/dL (ref 0.3–1.2)
Total Protein: 7.1 g/dL (ref 6.5–8.1)

## 2019-08-02 LAB — TSH: TSH: 0.898 u[IU]/mL (ref 0.320–4.118)

## 2019-08-02 MED ORDER — CYANOCOBALAMIN 1000 MCG/ML IJ SOLN
1000.0000 ug | Freq: Once | INTRAMUSCULAR | Status: AC
  Administered 2019-08-02: 1000 ug via INTRAMUSCULAR

## 2019-08-02 MED ORDER — CYANOCOBALAMIN 1000 MCG/ML IJ SOLN
INTRAMUSCULAR | Status: AC
  Filled 2019-08-02: qty 1

## 2019-08-02 MED ORDER — PROCHLORPERAZINE MALEATE 10 MG PO TABS
ORAL_TABLET | ORAL | Status: AC
  Filled 2019-08-02: qty 1

## 2019-08-02 MED ORDER — SODIUM CHLORIDE 0.9 % IV SOLN
500.0000 mg/m2 | Freq: Once | INTRAVENOUS | Status: AC
  Administered 2019-08-02: 12:00:00 1000 mg via INTRAVENOUS
  Filled 2019-08-02: qty 40

## 2019-08-02 MED ORDER — SODIUM CHLORIDE 0.9 % IV SOLN
Freq: Once | INTRAVENOUS | Status: AC
  Filled 2019-08-02: qty 250

## 2019-08-02 MED ORDER — SODIUM CHLORIDE 0.9% FLUSH
10.0000 mL | INTRAVENOUS | Status: DC | PRN
  Filled 2019-08-02: qty 10

## 2019-08-02 MED ORDER — SODIUM CHLORIDE 0.9 % IV SOLN
200.0000 mg | Freq: Once | INTRAVENOUS | Status: AC
  Administered 2019-08-02: 200 mg via INTRAVENOUS
  Filled 2019-08-02: qty 8

## 2019-08-02 MED ORDER — PROCHLORPERAZINE MALEATE 10 MG PO TABS
10.0000 mg | ORAL_TABLET | Freq: Once | ORAL | Status: AC
  Administered 2019-08-02: 10:00:00 10 mg via ORAL

## 2019-08-02 MED ORDER — HEPARIN SOD (PORK) LOCK FLUSH 100 UNIT/ML IV SOLN
500.0000 [IU] | Freq: Once | INTRAVENOUS | Status: DC | PRN
  Filled 2019-08-02: qty 5

## 2019-08-02 NOTE — Patient Instructions (Signed)
COVID-19 Vaccine Information can be found at: [email or web address] For questions related to vaccine distribution or appointments, please email [email or web address] or call [phone].   [facility] Discharge Instructions for Patients Receiving Chemotherapy  Today you received the following chemotherapy agents: Pembrolizumab (Keytruda) and Pemetrexed (Alimta)  To help prevent nausea and vomiting after your treatment, we encourage you to take your nausea medication as directed by your provider.   If you develop nausea and vomiting that is not controlled by your nausea medication, call the clinic.   BELOW ARE SYMPTOMS THAT SHOULD BE REPORTED IMMEDIATELY:  *FEVER GREATER THAN 100.5 F  *CHILLS WITH OR WITHOUT FEVER  NAUSEA AND VOMITING THAT IS NOT CONTROLLED WITH YOUR NAUSEA MEDICATION  *UNUSUAL SHORTNESS OF BREATH  *UNUSUAL BRUISING OR BLEEDING  TENDERNESS IN MOUTH AND THROAT WITH OR WITHOUT PRESENCE OF ULCERS  *URINARY PROBLEMS  *BOWEL PROBLEMS  UNUSUAL RASH Items with * indicate a potential emergency and should be followed up as soon as possible.  Feel free to call the clinic should you have any questions or concerns. The clinic phone number is [phone].  Please show the [facility] at check-in to the Emergency Department and triage nurse.  Coronavirus (COVID-19) Are you at risk?  Are you at risk for the Coronavirus (COVID-19)?  To be considered HIGH RISK for Coronavirus (COVID-19), you have to meet the following criteria:  . Traveled to [place], [place], [place], [place] or [place]; or in the [place] to [place], [place], [place], or [place]; and have fever, cough, and shortness of breath within the last 2 weeks of travel OR . Been in close contact with a person diagnosed with COVID-19 within the last 2 weeks and have fever, cough, and shortness of breath . IF YOU DO NOT MEET  THESE CRITERIA, YOU ARE CONSIDERED LOW RISK FOR COVID-19.  What to do if you are HIGH RISK for COVID-19?  Marland Kitchen If you are having a medical emergency, call 911. . Seek medical care right away. Before you go to a doctor's office, urgent care or emergency department, call ahead and tell them about your recent travel, contact with someone diagnosed with COVID-19, and your symptoms. You should receive instructions from your physician's office regarding next steps of care.  . When you arrive at healthcare provider, tell the healthcare staff immediately you have returned from visiting [place], [place], [place], [place] or [place]; or traveled in the [place] to [place], [place], [place], or [place]; in the last two weeks or you have been in close contact with a person diagnosed with COVID-19 in the last 2 weeks.   . Tell the health care staff about your symptoms: fever, cough and shortness of breath. . After you have been seen by a medical provider, you will be either: o Tested for (COVID-19) and discharged home on quarantine except to seek medical care if symptoms worsen, and asked to  - Stay home and avoid contact with others until you get your results (4-5 days)  - Avoid travel on public transportation if possible (such as bus, train, or airplane) or o Sent to the Emergency Department by EMS for evaluation, COVID-19 testing, and possible admission depending on your condition and test results.  What to do if you are LOW RISK for COVID-19?  Reduce your risk of any infection by using the same precautions used for avoiding the common cold or flu:  Marland Kitchen Wash your hands often with soap and warm  water for at least 20 seconds.  If soap and water are not readily available, use an alcohol-based hand sanitizer with at least 60% alcohol.  . If coughing or sneezing, cover your mouth and nose by coughing or sneezing into the elbow areas of your shirt or coat, into a tissue or into your sleeve (not your  hands). . Avoid shaking hands with others and consider head nods or verbal greetings only. . Avoid touching your eyes, nose, or mouth with unwashed hands.  . Avoid close contact with people who are sick. . Avoid places or events with large numbers of people in one location, like concerts or sporting events. . Carefully consider travel plans you have or are making. . If you are planning any travel outside or inside the [place], visit the CDC's Travelers' Health webpage for the latest health notices. . If you have some symptoms but not all symptoms, continue to monitor at home and seek medical attention if your symptoms worsen. . If you are having a medical emergency, call 911.   [facility] / e-Visit: [email or web address]         [facility]: [facility]: [phone]                   [facility]: [phone]

## 2019-08-02 NOTE — Progress Notes (Signed)
[facility] [phone]   [phone]  OFFICE PROGRESS NOTE  [name], MD [address] [place] [address]  DIAGNOSIS:  Stage IV (T1b, N3, M1c) non-small cell lung cancer, adenocarcinoma. 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  Biomarker Findings Tumor Mutational Burden - 10 Muts/Mb Microsatellite status - MS-Stable Genomic Findings For a complete list of the genes assayed, please refer to the Appendix. ERBB2 amplification - equivocal OZH08 M57* FANCC splice site 846+9G>E SMAD4 Q289* TP53 E298* 7 Disease relevant genes with no reportable alterations: ALK, BRAF, EGFR, KRAS, MET, RET, ROS1   PDL1 expression is 30%.  PRIOR THERAPY: 1) Weekly concurrent chemoradiation with carboplatin for an AUC of 2 and paclitaxel 45 mg/m2 to the locally advanced disease in the chest. He started the first dose 01/31/2019. Status post7cycles.Last dose of chemotherapy was given on March 14, 2019. 2) palliative radiotherapy to the metastatic disease in the right adrenal gland.  CURRENT THERAPY: Systemic chemotherapy with carboplatin for AUC of 5, Alimta 500 mg/M2 and Keytruda 200 mg IV every 3 weeks. First dose April 20, 2019. Status post 5 cycles.   INTERVAL HISTORY: Trevor Bailey 74 y.o. male returns to the clinic today for follow-up visit.  The patient is feeling fine today with no concerning complaints.  He denied having any chest pain, shortness of breath, cough or hemoptysis.  The patient denied having any nausea, vomiting, diarrhea or constipation.  He has no significant weight loss or night sweats.  He has no headache or visual changes.  He tolerated the last cycle of his treatment fairly well.  He is here today for evaluation before starting cycle #6  of his treatment.   MEDICAL HISTORY: Past Medical History:  Diagnosis Date   Adenopathy    left supraclavicular lymph node   Allergies    COPD (chronic obstructive pulmonary disease) (HCC)    Coronary disease    Diabetes (HCC)    Enlarged prostate    GERD (gastroesophageal reflux disease)    Glaucoma    Hyperlipidemia    Hypertension    Myocardial infarction ([facility])    Sleep apnea    wears CPAP   Wears partial dentures    upper and lower    ALLERGIES:  has No Known Allergies.  MEDICATIONS:  Current Outpatient Medications  Medication Sig Dispense Refill   amLODipine (NORVASC) 5 MG tablet TAKE 1 TABLET BY MOUTH  DAILY 90 tablet 1   aspirin EC 81 MG tablet Take 81 mg by mouth daily.     atorvastatin (LIPITOR) 80 MG tablet Take 1 tablet (80 mg total) by mouth daily. 90 tablet 3   CLARITIN 10 MG tablet Take 10 mg by mouth daily.      clopidogrel (PLAVIX) 75 MG tablet Take 1 tablet (75 mg total) by mouth every evening. 30 tablet 0   finasteride (PROSCAR) 5 MG tablet Take 5 mg by mouth daily.      folic acid (FOLVITE) 1 MG tablet Take 1 tablet (1 mg total) by mouth daily. 90 tablet 1   hydrochlorothiazide (HYDRODIURIL) 25 MG tablet Take 25 mg by mouth daily.      latanoprost (XALATAN) 0.005 % ophthalmic solution Place 1 drop into both eyes at bedtime.      metFORMIN (GLUCOPHAGE) 1000 MG tablet Take  1,000 mg by mouth 2 (two) times daily.      metoprolol tartrate (LOPRESSOR) 25 MG tablet Take 25 mg 2 (two) times daily by mouth.     Multiple Vitamin (MULTIVITAMIN WITH MINERALS) TABS tablet Take 1 tablet by mouth daily. Centrum Silver     pantoprazole (PROTONIX) 40 MG tablet Take 40 mg daily by mouth.     Polyethyl Glycol-Propyl Glycol (SYSTANE) 0.4-0.3 % SOLN Place 1-2 drops into both eyes 3 (three) times daily as needed (dry/irritated eyes.).     ramipril (ALTACE) 10 MG capsule Take 10 mg by mouth 2 (two) times daily.      VENTOLIN HFA 108 (90 Base)  MCG/ACT inhaler Inhale 1-2 puffs into the lungs every 6 (six) hours as needed for wheezing or shortness of breath.   0   ZETIA 10 MG tablet Take 10 mg by mouth every evening.      augmented betamethasone dipropionate (DIPROLENE-AF) 0.05 % cream      nitroGLYCERIN (NITROSTAT) 0.4 MG SL tablet Place 1 tablet (0.4 mg total) under the tongue every 5 (five) minutes as needed for chest pain. (Patient not taking: Reported on 08/02/2019) 25 tablet 6   OVER THE COUNTER MEDICATION Take 1 tablet by mouth daily as needed. Colace     prochlorperazine (COMPAZINE) 10 MG tablet Take 1 tablet (10 mg total) by mouth every 6 (six) hours as needed for nausea or vomiting. (Patient not taking: Reported on 08/02/2019) 30 tablet 2   traMADol (ULTRAM) 50 MG tablet Take 1 tablet (50 mg total) by mouth every 6 (six) hours as needed. (Patient not taking: Reported on 08/02/2019) 20 tablet 0   No current facility-administered medications for this visit.    SURGICAL HISTORY:  Past Surgical History:  Procedure Laterality Date   ABDOMINAL AORTAGRAM Left 09/16/2013   Procedure: ABDOMINAL AORTAGRAM;  Surgeon: Elam Dutch, MD;  Location: [facility] CATH LAB;  Service: Cardiovascular;  Laterality: Left;   CARDIAC CATHETERIZATION  01/2011   When there was segmental stenosis of the distal RCA, patent PCA stent, patent circumflex stent, and a patent small diagonal with 90% ISR.    COLONOSCOPY W/ BIOPSIES AND POLYPECTOMY     CORONARY ANGIOPLASTY  may 2002   Non-DES stenting of his circumflex, non-DES stenting of the RCA   HYDROCELE EXCISION / REPAIR  2009   by Dr Jackquline Bosch HERNIA REPAIR Bilateral    Dr Bubba Camp   LYMPH NODE BIOPSY Left 01/10/2019   Procedure: left supraclavicular LYMPH NODE BIOPSY;  Surgeon: [name], MD;  Location: [facility];  Service: Thoracic;  Laterality: Left;   MULTIPLE TOOTH EXTRACTIONS     NM [place]  01/08/2011   moderate in size and intensity area of reversible  ischemia in the basal to mid inferior and septal territories. Abnormal study   stents  2008   proximal RCA, DES for progession of disease.   US ECHOCARDIOGRAPHY  01/12/2012   mild concentric LVH, borderline LA enlargement, mild to mod TR    REVIEW OF SYSTEMS:  A comprehensive review of systems was negative.   PHYSICAL EXAMINATION: General appearance: alert, cooperative and no distress Head: Normocephalic, without obvious abnormality, atraumatic Neck: no adenopathy, no JVD, supple, symmetrical, trachea midline and thyroid not enlarged, symmetric, no tenderness/mass/nodules Lymph nodes: Cervical, supraclavicular, and axillary nodes normal. Resp: clear to auscultation bilaterally Back: symmetric, no curvature. ROM normal. No CVA tenderness. Cardio: regular rate and rhythm, S1, S2 normal, no murmur, click, rub  or gallop GI: soft, non-tender; bowel sounds normal; no masses,  no organomegaly Extremities: extremities normal, atraumatic, no cyanosis or edema  ECOG PERFORMANCE STATUS: 1 - Symptomatic but completely ambulatory  Blood pressure 133/81, pulse 77, temperature 98.1 F (36.7 C), temperature source Oral, resp. rate 18, height '5\' 9"'$  (1.753 m), weight 190 lb 1.6 oz (86.2 kg), SpO2 100 %.  LABORATORY DATA: Lab Results  Component Value Date   WBC 3.4 (L) 08/02/2019   HGB 10.6 (L) 08/02/2019   HCT 31.5 (L) 08/02/2019   MCV 102.9 (H) 08/02/2019   PLT 270 08/02/2019      Chemistry      Component Value Date/Time   NA 143 07/13/2019 0851   K 3.8 07/13/2019 0851   CL 106 07/13/2019 0851   CO2 24 07/13/2019 0851   BUN 17 07/13/2019 0851   CREATININE 1.00 07/13/2019 0851   CREATININE 0.94 05/16/2014 1109      Component Value Date/Time   CALCIUM 9.5 07/13/2019 0851   ALKPHOS 89 07/13/2019 0851   AST 28 07/13/2019 0851   ALT 30 07/13/2019 0851   BILITOT 0.3 07/13/2019 0851       RADIOGRAPHIC STUDIES: No results found.  ASSESSMENT AND PLAN: This is a very pleasant 74 years  old African-American male with stage IV non-small cell lung cancer, adenocarcinoma with no actionable mutation and PD-L1 expression of 30%, presented with locally advanced disease in the chest as well as solitary metastasis to the right adrenal gland. The patient completed a course of concurrent chemoradiation to the chest with weekly carboplatin for AUC of 2 and paclitaxel 45 mg/M2 status post 7 cycles..  The patient also received palliative radiotherapy to the adrenal glands. He is currently undergoing systemic chemotherapy with carboplatin, Alimta and Keytruda status post 5 cycles. I recommended for the patient to proceed with cycle #6 today as planned. He will come back for follow-up visit in 3 weeks for evaluation after repeating CT scan of the chest, abdomen and pelvis for restaging of his disease. The patient was advised to call immediately if he has any concerning symptoms in the interval. The patient voices understanding of current disease status and treatment options and is in agreement with the current care plan. All questions were answered. The patient knows to call the clinic with any problems, questions or concerns. We can certainly see the patient much sooner if necessary.   Disclaimer: This note was dictated with voice recognition software. Similar sounding words can inadvertently be transcribed and may not be corrected upon review.

## 2019-08-18 NOTE — Progress Notes (Signed)

## 2019-08-22 ENCOUNTER — Encounter (HOSPITAL_COMMUNITY): Payer: Self-pay

## 2019-08-22 ENCOUNTER — Other Ambulatory Visit: Payer: Self-pay

## 2019-08-22 ENCOUNTER — Ambulatory Visit (HOSPITAL_COMMUNITY)
Admission: RE | Admit: 2019-08-22 | Discharge: 2019-08-22 | Disposition: A | Discharge status: Home / Self Care | Payer: Medicare Other | Source: Ambulatory Visit | Attending: Internal Medicine | Admitting: Internal Medicine

## 2019-08-22 DIAGNOSIS — C3490 Malignant neoplasm of unspecified part of unspecified bronchus or lung: Secondary | ICD-10-CM | POA: Diagnosis not present

## 2019-08-22 MED ORDER — SODIUM CHLORIDE (PF) 0.9 % IJ SOLN
INTRAMUSCULAR | Status: AC
  Filled 2019-08-22: qty 50

## 2019-08-22 MED ORDER — IOHEXOL 300 MG/ML  SOLN
100.0000 mL | Freq: Once | INTRAMUSCULAR | Status: AC | PRN
  Administered 2019-08-22: 100 mL via INTRAVENOUS

## 2019-08-23 ENCOUNTER — Other Ambulatory Visit: Payer: Self-pay | Admitting: Internal Medicine

## 2019-08-23 DIAGNOSIS — I739 Peripheral vascular disease, unspecified: Secondary | ICD-10-CM

## 2019-08-25 ENCOUNTER — Encounter: Payer: Self-pay | Admitting: Internal Medicine

## 2019-08-25 ENCOUNTER — Inpatient Hospital Stay: Payer: Medicare Other | Attending: Internal Medicine | Admitting: Internal Medicine

## 2019-08-25 ENCOUNTER — Inpatient Hospital Stay: Payer: Medicare Other

## 2019-08-25 ENCOUNTER — Telehealth: Payer: Self-pay | Admitting: Medical Oncology

## 2019-08-25 ENCOUNTER — Other Ambulatory Visit: Payer: Self-pay

## 2019-08-25 VITALS — BP 114/69 | HR 80 | Temp 98.5°F | Resp 18 | Ht 69.0 in | Wt 187.7 lb

## 2019-08-25 DIAGNOSIS — Z79899 Other long term (current) drug therapy: Secondary | ICD-10-CM | POA: Insufficient documentation

## 2019-08-25 DIAGNOSIS — C3412 Malignant neoplasm of upper lobe, left bronchus or lung: Secondary | ICD-10-CM

## 2019-08-25 DIAGNOSIS — Z5111 Encounter for antineoplastic chemotherapy: Secondary | ICD-10-CM

## 2019-08-25 DIAGNOSIS — Z5112 Encounter for antineoplastic immunotherapy: Secondary | ICD-10-CM

## 2019-08-25 DIAGNOSIS — C7971 Secondary malignant neoplasm of right adrenal gland: Secondary | ICD-10-CM

## 2019-08-25 DIAGNOSIS — I10 Essential (primary) hypertension: Secondary | ICD-10-CM | POA: Diagnosis not present

## 2019-08-25 DIAGNOSIS — Z7984 Long term (current) use of oral hypoglycemic drugs: Secondary | ICD-10-CM | POA: Insufficient documentation

## 2019-08-25 LAB — CBC WITH DIFFERENTIAL (CANCER CENTER ONLY)
Abs Immature Granulocytes: 0.01 10*3/uL (ref 0.00–0.07)
Basophils Absolute: 0 10*3/uL (ref 0.0–0.1)
Basophils Relative: 1 %
Eosinophils Absolute: 0.1 10*3/uL (ref 0.0–0.5)
Eosinophils Relative: 2 %
HCT: 33 % — ABNORMAL LOW (ref 39.0–52.0)
Hemoglobin: 10.4 g/dL — ABNORMAL LOW (ref 13.0–17.0)
Immature Granulocytes: 0 %
Lymphocytes Relative: 13 %
Lymphs Abs: 0.5 10*3/uL — ABNORMAL LOW (ref 0.7–4.0)
MCH: 34.4 pg — ABNORMAL HIGH (ref 26.0–34.0)
MCHC: 31.5 g/dL (ref 30.0–36.0)
MCV: 109.3 fL — ABNORMAL HIGH (ref 80.0–100.0)
Monocytes Absolute: 0.8 10*3/uL (ref 0.1–1.0)
Monocytes Relative: 19 %
Neutro Abs: 2.6 10*3/uL (ref 1.7–7.7)
Neutrophils Relative %: 65 %
Platelet Count: 322 10*3/uL (ref 150–400)
RBC: 3.02 MIL/uL — ABNORMAL LOW (ref 4.22–5.81)
RDW: 14.1 % (ref 11.5–15.5)
WBC Count: 4 10*3/uL (ref 4.0–10.5)
nRBC: 0 % (ref 0.0–0.2)

## 2019-08-25 LAB — CMP (CANCER CENTER ONLY)
ALT: 43 U/L (ref 0–44)
AST: 45 U/L — ABNORMAL HIGH (ref 15–41)
Albumin: 3.3 g/dL — ABNORMAL LOW (ref 3.5–5.0)
Alkaline Phosphatase: 117 U/L (ref 38–126)
Anion gap: 10 (ref 5–15)
BUN: 16 mg/dL (ref 8–23)
CO2: 26 mmol/L (ref 22–32)
Calcium: 10 mg/dL (ref 8.9–10.3)
Chloride: 106 mmol/L (ref 98–111)
Creatinine: 1.19 mg/dL (ref 0.61–1.24)
GFR, Est AFR Am: >60 mL/min (ref >60)
GFR, Estimated: 60 mL/min — ABNORMAL LOW (ref >60)
Glucose, Bld: 126 mg/dL — ABNORMAL HIGH (ref 70–99)
Potassium: 4 mmol/L (ref 3.5–5.1)
Sodium: 142 mmol/L (ref 135–145)
Total Bilirubin: 0.3 mg/dL (ref 0.3–1.2)
Total Protein: 7.7 g/dL (ref 6.5–8.1)

## 2019-08-25 LAB — TSH: TSH: 0.492 u[IU]/mL (ref 0.320–4.118)

## 2019-08-25 MED ORDER — SODIUM CHLORIDE 0.9 % IV SOLN
Freq: Once | INTRAVENOUS | Status: AC
  Filled 2019-08-25: qty 250

## 2019-08-25 MED ORDER — MIRTAZAPINE 30 MG PO TABS: 30.0000 mg | ORAL_TABLET | Freq: Every day | ORAL | 2 refills | Status: DC

## 2019-08-25 MED ORDER — SODIUM CHLORIDE 0.9 % IV SOLN
200.0000 mg | Freq: Once | INTRAVENOUS | Status: AC
  Administered 2019-08-25: 200 mg via INTRAVENOUS
  Filled 2019-08-25: qty 8

## 2019-08-25 MED ORDER — PROCHLORPERAZINE MALEATE 10 MG PO TABS
10.0000 mg | ORAL_TABLET | Freq: Once | ORAL | Status: AC
  Administered 2019-08-25: 13:00:00 10 mg via ORAL

## 2019-08-25 MED ORDER — SODIUM CHLORIDE 0.9 % IV SOLN
500.0000 mg/m2 | Freq: Once | INTRAVENOUS | Status: AC
  Administered 2019-08-25: 14:00:00 1000 mg via INTRAVENOUS
  Filled 2019-08-25: qty 40

## 2019-08-25 MED ORDER — PROCHLORPERAZINE MALEATE 10 MG PO TABS
ORAL_TABLET | ORAL | Status: AC
  Filled 2019-08-25: qty 1

## 2019-08-25 NOTE — Patient Instructions (Signed)
[facility] Discharge Instructions for Patients Receiving Chemotherapy  Today you received the following chemotherapy agents Keytruda; Alimta  To help prevent nausea and vomiting after your treatment, we encourage you to take your nausea medication as directed   If you develop nausea and vomiting that is not controlled by your nausea medication, call the clinic.   BELOW ARE SYMPTOMS THAT SHOULD BE REPORTED IMMEDIATELY:  *FEVER GREATER THAN 100.5 F  *CHILLS WITH OR WITHOUT FEVER  NAUSEA AND VOMITING THAT IS NOT CONTROLLED WITH YOUR NAUSEA MEDICATION  *UNUSUAL SHORTNESS OF BREATH  *UNUSUAL BRUISING OR BLEEDING  TENDERNESS IN MOUTH AND THROAT WITH OR WITHOUT PRESENCE OF ULCERS  *URINARY PROBLEMS  *BOWEL PROBLEMS  UNUSUAL RASH Items with * indicate a potential emergency and should be followed up as soon as possible.  Feel free to call the clinic should you have any questions or concerns. The clinic phone number is [phone].  Please show the CHEMO ALERT CARD at check-in to the Emergency Department and triage nurse.   

## 2019-08-25 NOTE — Progress Notes (Signed)
[facility] [phone]   [phone]  OFFICE PROGRESS NOTE  [name], MD [address] [place] [address]  DIAGNOSIS:  Stage IV (T1b, N3, M1c) non-small cell lung cancer, adenocarcinoma. 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  Biomarker Findings Tumor Mutational Burden - 10 Muts/Mb Microsatellite status - MS-Stable Genomic Findings For a complete list of the genes assayed, please refer to the Appendix. ERBB2 amplification - equivocal? MOQ94 T65* FANCC splice site 465+0P>T SMAD4 Q289* TP53 E298* 7 Disease relevant genes with no reportable alterations: ALK, BRAF, EGFR, KRAS, MET, RET, ROS1   PDL1 expression is 30%.  PRIOR THERAPY: 1) Weekly concurrent chemoradiation with carboplatin for an AUC of 2 and paclitaxel 45 mg/m2 to the locally advanced disease in the chest. He started the first dose 01/31/2019. Status post7cycles.Last dose of chemotherapy was given on March 14, 2019. 2) palliative radiotherapy to the metastatic disease in the right adrenal gland.  CURRENT THERAPY: Systemic chemotherapy with carboplatin for AUC of 5, Alimta 500 mg/M2 and Keytruda 200 mg IV every 3 weeks. First dose April 20, 2019. Status post 6 cycles.   Starting from cycle #5, the patient is on maintenance treatment with Alimta and Keytruda every 3 weeks.  INTERVAL HISTORY: Trevor Bailey 74 y.o. male returns to the clinic today for follow-up visit.  The patient is feeling fine today with no concerning complaints except for lack of appetite and depression.  He denied having any current chest pain, shortness of breath, cough or hemoptysis.  He denied having any fever or chills.  He has no nausea, vomiting, diarrhea or constipation.  He has no headache or visual  changes.  He has been tolerating his maintenance treatment with Alimta and Keytruda fairly well.  The patient had repeat CT scan of the chest, abdomen pelvis performed recently and he is here for evaluation and discussion of his discuss results.  MEDICAL HISTORY: Past Medical History:  Diagnosis Date  . Adenopathy    left supraclavicular lymph node  . Allergies   . COPD (chronic obstructive pulmonary disease) ([place])   . Coronary disease   . Diabetes ([place])   . Enlarged prostate   . GERD (gastroesophageal reflux disease)   . Glaucoma   . Hyperlipidemia   . Hypertension   . Myocardial infarction ([place])   . Sleep apnea    wears CPAP  . Wears partial dentures    upper and lower    ALLERGIES:  has No Known Allergies.  MEDICATIONS:  Current Outpatient Medications  Medication Sig Dispense Refill  . amLODipine (NORVASC) 5 MG tablet TAKE 1 TABLET BY MOUTH  DAILY 90 tablet 1  . aspirin EC 81 MG tablet Take 81 mg by mouth daily.    Marland Kitchen atorvastatin (LIPITOR) 80 MG tablet Take 1 tablet (80 mg total) by mouth daily. 90 tablet 3  . augmented betamethasone dipropionate (DIPROLENE-AF) 0.05 % cream     . CLARITIN 10 MG tablet Take 10 mg by mouth daily.     . clopidogrel (PLAVIX) 75 MG tablet Take 1 tablet (75 mg total) by mouth every evening. 30 tablet 0  . finasteride (PROSCAR) 5 MG tablet Take 5 mg by mouth daily.     . folic acid (FOLVITE) 1 MG tablet Take 1 tablet (1 mg total) by  mouth daily. 90 tablet 1  . hydrochlorothiazide (HYDRODIURIL) 25 MG tablet Take 25 mg by mouth daily.     Marland Kitchen latanoprost (XALATAN) 0.005 % ophthalmic solution Place 1 drop into both eyes at bedtime.     . metFORMIN (GLUCOPHAGE) 1000 MG tablet Take 1,000 mg by mouth 2 (two) times daily.     . metoprolol tartrate (LOPRESSOR) 25 MG tablet Take 25 mg 2 (two) times daily by mouth.    . Multiple Vitamin (MULTIVITAMIN WITH MINERALS) TABS tablet Take 1 tablet by mouth daily. Centrum Silver    . nitroGLYCERIN (NITROSTAT) 0.4 MG  SL tablet Place 1 tablet (0.4 mg total) under the tongue every 5 (five) minutes as needed for chest pain. (Patient not taking: Reported on 08/02/2019) 25 tablet 6  . OVER THE COUNTER MEDICATION Take 1 tablet by mouth daily as needed. Colace    . pantoprazole (PROTONIX) 40 MG tablet Take 40 mg daily by mouth.    Vladimir Faster Glycol-Propyl Glycol (SYSTANE) 0.4-0.3 % SOLN Place 1-2 drops into both eyes 3 (three) times daily as needed (dry/irritated eyes.).    Marland Kitchen prochlorperazine (COMPAZINE) 10 MG tablet Take 1 tablet (10 mg total) by mouth every 6 (six) hours as needed for nausea or vomiting. (Patient not taking: Reported on 08/02/2019) 30 tablet 2  . ramipril (ALTACE) 10 MG capsule Take 10 mg by mouth 2 (two) times daily.     . traMADol (ULTRAM) 50 MG tablet Take 1 tablet (50 mg total) by mouth every 6 (six) hours as needed. (Patient not taking: Reported on 08/02/2019) 20 tablet 0  . VENTOLIN HFA 108 (90 Base) MCG/ACT inhaler Inhale 1-2 puffs into the lungs every 6 (six) hours as needed for wheezing or shortness of breath.   0  . ZETIA 10 MG tablet Take 10 mg by mouth every evening.      No current facility-administered medications for this visit.    SURGICAL HISTORY:  Past Surgical History:  Procedure Laterality Date  . ABDOMINAL AORTAGRAM Left 09/16/2013   Procedure: ABDOMINAL Maxcine Ham;  Surgeon: Elam Dutch, MD;  Location: [facility] CATH LAB;  Service: Cardiovascular;  Laterality: Left;  . CARDIAC CATHETERIZATION  01/2011   When there was segmental stenosis of the distal RCA, patent PCA stent, patent circumflex stent, and a patent small diagonal with 90% ISR.   Marland Kitchen COLONOSCOPY W/ BIOPSIES AND POLYPECTOMY    . CORONARY ANGIOPLASTY  may 2002   Non-DES stenting of his circumflex, non-DES stenting of the RCA  . HYDROCELE EXCISION / REPAIR  2009   by Dr Janice Norrie  . INGUINAL HERNIA REPAIR Bilateral    Dr Bubba Camp  . LYMPH NODE BIOPSY Left 01/10/2019   Procedure: left supraclavicular LYMPH NODE BIOPSY;   Surgeon: [name], MD;  Location: [facility];  Service: Thoracic;  Laterality: Left;  Marland Kitchen MULTIPLE TOOTH EXTRACTIONS    . NM MYOCAR PERF WALL MOTION  01/08/2011   moderate in size and intensity area of reversible ischemia in the basal to mid inferior and septal territories. Abnormal study  . stents  2008   proximal RCA, DES for progession of disease.  Marland Kitchen US ECHOCARDIOGRAPHY  01/12/2012   mild concentric LVH, borderline LA enlargement, mild to mod TR    REVIEW OF SYSTEMS:  Constitutional: positive for fatigue Eyes: negative Ears, nose, mouth, throat, and face: negative Respiratory: negative Cardiovascular: negative Gastrointestinal: negative Genitourinary:negative Integument/breast: negative Hematologic/lymphatic: negative Musculoskeletal:negative Neurological: negative Behavioral/Psych: positive for depression Endocrine: negative Allergic/Immunologic: negative   PHYSICAL  EXAMINATION: General appearance: alert, cooperative, fatigued and no distress Head: Normocephalic, without obvious abnormality, atraumatic Neck: no adenopathy, no JVD, supple, symmetrical, trachea midline and thyroid not enlarged, symmetric, no tenderness/mass/nodules Lymph nodes: Cervical, supraclavicular, and axillary nodes normal. Resp: clear to auscultation bilaterally Back: symmetric, no curvature. ROM normal. No CVA tenderness. Cardio: regular rate and rhythm, S1, S2 normal, no murmur, click, rub or gallop GI: soft, non-tender; bowel sounds normal; no masses,  no organomegaly Extremities: extremities normal, atraumatic, no cyanosis or edema Neurologic: Alert and oriented X 3, normal strength and tone. Normal symmetric reflexes. Normal coordination and gait  ECOG PERFORMANCE STATUS: 1 - Symptomatic but completely ambulatory  Blood pressure 114/69, pulse 80, temperature 98.5 F (36.9 C), temperature source Temporal, resp. rate 18, height '5\' 9"'$  (1.753 m), weight 187 lb 11.2 oz (85.1 kg), SpO2 100  %.  LABORATORY DATA: Lab Results  Component Value Date   WBC 4.0 08/25/2019   HGB 10.4 (L) 08/25/2019   HCT 33.0 (L) 08/25/2019   MCV 109.3 (H) 08/25/2019   PLT 322 08/25/2019      Chemistry      Component Value Date/Time   NA 142 08/02/2019 0842   K 4.0 08/02/2019 0842   CL 106 08/02/2019 0842   CO2 24 08/02/2019 0842   BUN 15 08/02/2019 0842   CREATININE 0.97 08/02/2019 0842   CREATININE 0.94 05/16/2014 1109      Component Value Date/Time   CALCIUM 9.5 08/02/2019 0842   ALKPHOS 89 08/02/2019 0842   AST 44 (H) 08/02/2019 0842   ALT 66 (H) 08/02/2019 0842   BILITOT 0.3 08/02/2019 0842       RADIOGRAPHIC STUDIES: CT Chest W Contrast  Result Date: 08/22/2019 CLINICAL DATA:  Restaging lung cancer. EXAM: CT CHEST, ABDOMEN, AND PELVIS WITH CONTRAST TECHNIQUE: Multidetector CT imaging of the chest, abdomen and pelvis was performed following the standard protocol during bolus administration of intravenous contrast. CONTRAST:  176m OMNIPAQUE IOHEXOL 300 MG/ML  SOLN COMPARISON:  06/20/2019 FINDINGS: CT CHEST FINDINGS Cardiovascular: The heart is normal in size. No pericardial effusion. The aorta is normal in caliber. No dissection. Stable advanced atherosclerotic calcifications mainly at the aortic arch and branch vessel ostia. Stable significant three-vessel coronary artery calcifications. The pulmonary arteries are grossly normal. Mediastinum/Nodes: No mediastinal or hilar mass or adenopathy. The esophagus is grossly normal. Stable subcentimeter thyroid nodules. Lungs/Pleura: Stable appearance of the left upper lobe pulmonary nodule. Maximum diameter is 6 mm and was previously 7 mm. Stable radiation changes involving the left upper lobe. No new or progressive findings are identified. Stable emphysematous changes and areas of pulmonary scarring. No evidence of metastatic pulmonary nodules. Musculoskeletal: No supraclavicular or axillary adenopathy. The bony thorax is intact. No worrisome  bone lesions. Stable degenerative changes involving the spine. CT ABDOMEN PELVIS FINDINGS Hepatobiliary: Stable scattered hepatic cysts. No worrisome hepatic lesions to suggest metastatic disease. The gallbladder is normal. No intra or extrahepatic biliary dilatation. Pancreas: No mass, inflammation or ductal dilatation. Spleen: Normal size. No focal lesions. Adrenals/Urinary Tract: Adrenal complete resolution of right adrenal gland mass. The left adrenal gland is normal. Stable right renal cyst. No worrisome renal lesions. The bladder is unremarkable. Stomach/Bowel: The stomach, duodenum, small bowel and colon are unremarkable. No acute inflammatory changes, mass lesions or obstructive findings. The terminal ileum is normal. The appendix is normal. Stable colonic diverticulosis but no findings for acute diverticulitis. Vascular/Lymphatic: Stable advanced aortic and branch vessel calcifications but no aneurysm or dissection. Stable severe iliac artery  atherosclerotic disease. No mesenteric or retroperitoneal mass or adenopathy. Reproductive: Mild prostate gland enlargement is stable. There is median lobe hypertrophy impressing on the base of the bladder. Other: No pelvic mass or adenopathy. No free pelvic fluid collections. No inguinal mass or adenopathy. No abdominal wall hernia or subcutaneous lesions. Musculoskeletal: No significant bony findings. Stable advanced degenerative changes involving the lumbar spine. IMPRESSION: 1. Stable radiation changes involving the left upper lobe. Slightly smaller left upper lobe lesion now measuring 6 mm. No new or progressive findings. 2. Stable emphysematous changes and pulmonary scarring. No new pulmonary lesions. 3. No mediastinal or hilar mass or adenopathy. 4. Complete resolution of right adrenal gland mass. No findings for abdominal/pelvic metastatic disease. 5. Stable hepatic cysts. 6. Stable advanced atherosclerotic calcifications involving the thoracic and abdominal  aorta and branch vessels including the coronary arteries. Aortic Atherosclerosis (ICD10-I70.0). Electronically Signed   By: Marijo Sanes M.D.   On: 08/22/2019 09:33   CT Abdomen Pelvis W Contrast  Result Date: 08/22/2019 CLINICAL DATA:  Restaging lung cancer. EXAM: CT CHEST, ABDOMEN, AND PELVIS WITH CONTRAST TECHNIQUE: Multidetector CT imaging of the chest, abdomen and pelvis was performed following the standard protocol during bolus administration of intravenous contrast. CONTRAST:  175m OMNIPAQUE IOHEXOL 300 MG/ML  SOLN COMPARISON:  06/20/2019 FINDINGS: CT CHEST FINDINGS Cardiovascular: The heart is normal in size. No pericardial effusion. The aorta is normal in caliber. No dissection. Stable advanced atherosclerotic calcifications mainly at the aortic arch and branch vessel ostia. Stable significant three-vessel coronary artery calcifications. The pulmonary arteries are grossly normal. Mediastinum/Nodes: No mediastinal or hilar mass or adenopathy. The esophagus is grossly normal. Stable subcentimeter thyroid nodules. Lungs/Pleura: Stable appearance of the left upper lobe pulmonary nodule. Maximum diameter is 6 mm and was previously 7 mm. Stable radiation changes involving the left upper lobe. No new or progressive findings are identified. Stable emphysematous changes and areas of pulmonary scarring. No evidence of metastatic pulmonary nodules. Musculoskeletal: No supraclavicular or axillary adenopathy. The bony thorax is intact. No worrisome bone lesions. Stable degenerative changes involving the spine. CT ABDOMEN PELVIS FINDINGS Hepatobiliary: Stable scattered hepatic cysts. No worrisome hepatic lesions to suggest metastatic disease. The gallbladder is normal. No intra or extrahepatic biliary dilatation. Pancreas: No mass, inflammation or ductal dilatation. Spleen: Normal size. No focal lesions. Adrenals/Urinary Tract: Adrenal complete resolution of right adrenal gland mass. The left adrenal gland is  normal. Stable right renal cyst. No worrisome renal lesions. The bladder is unremarkable. Stomach/Bowel: The stomach, duodenum, small bowel and colon are unremarkable. No acute inflammatory changes, mass lesions or obstructive findings. The terminal ileum is normal. The appendix is normal. Stable colonic diverticulosis but no findings for acute diverticulitis. Vascular/Lymphatic: Stable advanced aortic and branch vessel calcifications but no aneurysm or dissection. Stable severe iliac artery atherosclerotic disease. No mesenteric or retroperitoneal mass or adenopathy. Reproductive: Mild prostate gland enlargement is stable. There is median lobe hypertrophy impressing on the base of the bladder. Other: No pelvic mass or adenopathy. No free pelvic fluid collections. No inguinal mass or adenopathy. No abdominal wall hernia or subcutaneous lesions. Musculoskeletal: No significant bony findings. Stable advanced degenerative changes involving the lumbar spine. IMPRESSION: 1. Stable radiation changes involving the left upper lobe. Slightly smaller left upper lobe lesion now measuring 6 mm. No new or progressive findings. 2. Stable emphysematous changes and pulmonary scarring. No new pulmonary lesions. 3. No mediastinal or hilar mass or adenopathy. 4. Complete resolution of right adrenal gland mass. No findings for abdominal/pelvic metastatic disease.  5. Stable hepatic cysts. 6. Stable advanced atherosclerotic calcifications involving the thoracic and abdominal aorta and branch vessels including the coronary arteries. Aortic Atherosclerosis (ICD10-I70.0). Electronically Signed   By: Marijo Sanes M.D.   On: 08/22/2019 09:33    ASSESSMENT AND PLAN: This is a very pleasant 74 years old African-American male with stage IV non-small cell lung cancer, adenocarcinoma with no actionable mutation and PD-L1 expression of 30%, presented with locally advanced disease in the chest as well as solitary metastasis to the right adrenal  gland. The patient completed a course of concurrent chemoradiation to the chest with weekly carboplatin for AUC of 2 and paclitaxel 45 mg/M2 status post 7 cycles..  The patient also received palliative radiotherapy to the adrenal glands. He is currently undergoing systemic chemotherapy with carboplatin, Alimta and Keytruda status post 6 cycles.  Starting from cycle #5 he is on maintenance treatment with Alimta and Keytruda.  He tolerated the last cycle of his treatment fairly well. The patient had repeat CT scan of the chest, abdomen and pelvis performed recently.  I personally and independently reviewed the scans and discussed the results with the patient today. His scan showed no concerning findings for disease progression. I recommended for the patient to continue his current treatment with maintenance Alimta and Keytruda and he will proceed with cycle #7 today. For the depression and lack of appetite, I will start the patient on Remeron 30 mg p.o. nightly. The patient will come back for follow-up visit in 3 weeks for evaluation before starting cycle #8. He was advised to call immediately if he has any concerning symptoms in the interval. The patient voices understanding of current disease status and treatment options and is in agreement with the current care plan. All questions were answered. The patient knows to call the clinic with any problems, questions or concerns. We can certainly see the patient much sooner if necessary.   Disclaimer: This note was dictated with voice recognition software. Similar sounding words can inadvertently be transcribed and may not be corrected upon review.

## 2019-08-25 NOTE — Telephone Encounter (Signed)
Remeron -Pt requests #90 day supply. Called [facility]-they will  Change the quantity .Marland Kitchen

## 2019-08-26 ENCOUNTER — Ambulatory Visit
Admission: RE | Admit: 2019-08-26 | Discharge: 2019-08-26 | Disposition: A | Discharge status: Home / Self Care | Payer: Medicare Other | Source: Ambulatory Visit | Attending: Internal Medicine | Admitting: Internal Medicine

## 2019-08-26 ENCOUNTER — Other Ambulatory Visit: Payer: Medicare Other

## 2019-08-26 DIAGNOSIS — I739 Peripheral vascular disease, unspecified: Secondary | ICD-10-CM

## 2019-09-07 ENCOUNTER — Telehealth: Payer: Self-pay | Admitting: Medical Oncology

## 2019-09-07 NOTE — Telephone Encounter (Signed)
"  Is it safe for me to travel?" I told him to adhere to covid precautions.

## 2019-09-09 NOTE — Progress Notes (Signed)
Pharmacist Chemotherapy Monitoring - Follow Up Assessment    I verify that I have reviewed each item in the below checklist:  . Regimen for the patient is scheduled for the appropriate day and plan matches scheduled date. Marland Kitchen Appropriate non-routine labs are ordered dependent on drug ordered. . If applicable, additional medications reviewed and ordered per protocol based on lifetime cumulative doses and/or treatment regimen.   Plan for follow-up and/or issues identified: No . I-vent associated with next due treatment: No . MD and/or nursing notified: No  Acquanetta Belling 09/09/2019 6:07 PM

## 2019-09-14 ENCOUNTER — Inpatient Hospital Stay: Payer: Medicare Other

## 2019-09-14 ENCOUNTER — Encounter: Payer: Self-pay | Admitting: *Deleted

## 2019-09-14 ENCOUNTER — Inpatient Hospital Stay: Payer: Medicare Other | Admitting: Internal Medicine

## 2019-09-14 ENCOUNTER — Encounter: Payer: Self-pay | Admitting: Internal Medicine

## 2019-09-14 ENCOUNTER — Other Ambulatory Visit: Payer: Self-pay

## 2019-09-14 VITALS — BP 120/75 | HR 77 | Temp 98.3°F | Resp 18 | Ht 69.0 in | Wt 186.4 lb

## 2019-09-14 DIAGNOSIS — C3412 Malignant neoplasm of upper lobe, left bronchus or lung: Secondary | ICD-10-CM

## 2019-09-14 DIAGNOSIS — Z5111 Encounter for antineoplastic chemotherapy: Secondary | ICD-10-CM

## 2019-09-14 DIAGNOSIS — I10 Essential (primary) hypertension: Secondary | ICD-10-CM | POA: Diagnosis not present

## 2019-09-14 DIAGNOSIS — Z5112 Encounter for antineoplastic immunotherapy: Secondary | ICD-10-CM

## 2019-09-14 LAB — CMP (CANCER CENTER ONLY)
ALT: 45 U/L — ABNORMAL HIGH (ref 0–44)
AST: 37 U/L (ref 15–41)
Albumin: 3.4 g/dL — ABNORMAL LOW (ref 3.5–5.0)
Alkaline Phosphatase: 114 U/L (ref 38–126)
Anion gap: 12 (ref 5–15)
BUN: 16 mg/dL (ref 8–23)
CO2: 24 mmol/L (ref 22–32)
Calcium: 9.9 mg/dL (ref 8.9–10.3)
Chloride: 104 mmol/L (ref 98–111)
Creatinine: 1.12 mg/dL (ref 0.61–1.24)
GFR, Est AFR Am: >60 mL/min (ref >60)
GFR, Estimated: >60 mL/min (ref >60)
Glucose, Bld: 173 mg/dL — ABNORMAL HIGH (ref 70–99)
Potassium: 4.3 mmol/L (ref 3.5–5.1)
Sodium: 140 mmol/L (ref 135–145)
Total Bilirubin: 0.3 mg/dL (ref 0.3–1.2)
Total Protein: 7.4 g/dL (ref 6.5–8.1)

## 2019-09-14 LAB — CBC WITH DIFFERENTIAL (CANCER CENTER ONLY)
Abs Immature Granulocytes: 0.02 10*3/uL (ref 0.00–0.07)
Basophils Absolute: 0 10*3/uL (ref 0.0–0.1)
Basophils Relative: 1 %
Eosinophils Absolute: 0.1 10*3/uL (ref 0.0–0.5)
Eosinophils Relative: 2 %
HCT: 33 % — ABNORMAL LOW (ref 39.0–52.0)
Hemoglobin: 10.7 g/dL — ABNORMAL LOW (ref 13.0–17.0)
Immature Granulocytes: 0 %
Lymphocytes Relative: 11 %
Lymphs Abs: 0.6 10*3/uL — ABNORMAL LOW (ref 0.7–4.0)
MCH: 35.3 pg — ABNORMAL HIGH (ref 26.0–34.0)
MCHC: 32.4 g/dL (ref 30.0–36.0)
MCV: 108.9 fL — ABNORMAL HIGH (ref 80.0–100.0)
Monocytes Absolute: 0.7 10*3/uL (ref 0.1–1.0)
Monocytes Relative: 14 %
Neutro Abs: 3.6 10*3/uL (ref 1.7–7.7)
Neutrophils Relative %: 72 %
Platelet Count: 260 10*3/uL (ref 150–400)
RBC: 3.03 MIL/uL — ABNORMAL LOW (ref 4.22–5.81)
RDW: 14.2 % (ref 11.5–15.5)
WBC Count: 5 10*3/uL (ref 4.0–10.5)
nRBC: 0 % (ref 0.0–0.2)

## 2019-09-14 LAB — TSH: TSH: 0.653 u[IU]/mL (ref 0.320–4.118)

## 2019-09-14 MED ORDER — SODIUM CHLORIDE 0.9 % IV SOLN
Freq: Once | INTRAVENOUS | Status: AC
  Filled 2019-09-14: qty 250

## 2019-09-14 MED ORDER — SODIUM CHLORIDE 0.9 % IV SOLN
200.0000 mg | Freq: Once | INTRAVENOUS | Status: AC
  Administered 2019-09-14: 14:00:00 200 mg via INTRAVENOUS
  Filled 2019-09-14: qty 8

## 2019-09-14 MED ORDER — SODIUM CHLORIDE 0.9 % IV SOLN
500.0000 mg/m2 | Freq: Once | INTRAVENOUS | Status: AC
  Administered 2019-09-14: 15:00:00 1000 mg via INTRAVENOUS
  Filled 2019-09-14: qty 40

## 2019-09-14 MED ORDER — PROCHLORPERAZINE MALEATE 10 MG PO TABS
10.0000 mg | ORAL_TABLET | Freq: Once | ORAL | Status: AC
  Administered 2019-09-14: 13:00:00 10 mg via ORAL

## 2019-09-14 MED ORDER — PROCHLORPERAZINE MALEATE 10 MG PO TABS
ORAL_TABLET | ORAL | Status: AC
  Filled 2019-09-14: qty 1

## 2019-09-14 NOTE — Progress Notes (Signed)
[facility] [phone]   [phone]  OFFICE PROGRESS NOTE  [name], MD [address] [place] [address]  DIAGNOSIS:  Stage IV (T1b, N3, M1c) non-small cell lung cancer, adenocarcinoma. 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  Biomarker Findings Tumor Mutational Burden - 10 Muts/Mb Microsatellite status - MS-Stable Genomic Findings For a complete list of the genes assayed, please refer to the Appendix. ERBB2 amplification - equivocal? LXB26 O03* FANCC splice site 559+7C>B SMAD4 Q289* TP53 E298* 7 Disease relevant genes with no reportable alterations: ALK, BRAF, EGFR, KRAS, MET, RET, ROS1   PDL1 expression is 30%.  PRIOR THERAPY: 1) Weekly concurrent chemoradiation with carboplatin for an AUC of 2 and paclitaxel 45 mg/m2 to the locally advanced disease in the chest. He started the first dose 01/31/2019. Status post7cycles.Last dose of chemotherapy was given on March 14, 2019. 2) palliative radiotherapy to the metastatic disease in the right adrenal gland.  CURRENT THERAPY: Systemic chemotherapy with carboplatin for AUC of 5, Alimta 500 mg/M2 and Keytruda 200 mg IV every 3 weeks. First dose April 20, 2019. Status post 7 cycles.   Starting from cycle #5, the patient is on maintenance treatment with Alimta and Keytruda every 3 weeks.  INTERVAL HISTORY: Trevor Bailey 74 y.o. male returns to the clinic today for follow-up visit.  The patient is feeling fine today with no concerning complaints except for mild dysphagia.  He denied having any chest pain, shortness of breath, cough or hemoptysis.  He denied having any fever or chills.  He has no nausea, vomiting, diarrhea or constipation.  He has no headache or visual changes.  He continues to  tolerate his treatment with maintenance Alimta and Keytruda fairly well.  The patient is here today for evaluation before starting cycle #8 of his treatment.  MEDICAL HISTORY: Past Medical History:  Diagnosis Date  . Adenopathy    left supraclavicular lymph node  . Allergies   . COPD (chronic obstructive pulmonary disease) ([place])   . Coronary disease   . Diabetes ([place])   . Enlarged prostate   . GERD (gastroesophageal reflux disease)   . Glaucoma   . Hyperlipidemia   . Hypertension   . Myocardial infarction ([place])   . Sleep apnea    wears CPAP  . Wears partial dentures    upper and lower    ALLERGIES:  has No Known Allergies.  MEDICATIONS:  Current Outpatient Medications  Medication Sig Dispense Refill  . amLODipine (NORVASC) 5 MG tablet TAKE 1 TABLET BY MOUTH  DAILY 90 tablet 1  . aspirin EC 81 MG tablet Take 81 mg by mouth daily.    Marland Kitchen atorvastatin (LIPITOR) 80 MG tablet Take 1 tablet (80 mg total) by mouth daily. 90 tablet 3  . augmented betamethasone dipropionate (DIPROLENE-AF) 0.05 % cream     . CLARITIN 10 MG tablet Take 10 mg by mouth daily.     . clopidogrel (PLAVIX) 75 MG tablet Take 1 tablet (75 mg total) by mouth every evening. 30 tablet 0  . finasteride (PROSCAR) 5 MG tablet Take 5 mg by mouth daily.     . folic acid (FOLVITE) 1 MG tablet Take 1 tablet (1 mg total) by mouth daily. 90 tablet 1  . hydrochlorothiazide (HYDRODIURIL) 25 MG tablet Take 25 mg  by mouth daily.     Marland Kitchen latanoprost (XALATAN) 0.005 % ophthalmic solution Place 1 drop into both eyes at bedtime.     . metFORMIN (GLUCOPHAGE) 1000 MG tablet Take 1,000 mg by mouth 2 (two) times daily.     . metoprolol tartrate (LOPRESSOR) 25 MG tablet Take 25 mg 2 (two) times daily by mouth.    . mirtazapine (REMERON) 30 MG tablet Take 1 tablet (30 mg total) by mouth at bedtime. 30 tablet 2  . Multiple Vitamin (MULTIVITAMIN WITH MINERALS) TABS tablet Take 1 tablet by mouth daily. Centrum Silver    . nitroGLYCERIN  (NITROSTAT) 0.4 MG SL tablet Place 1 tablet (0.4 mg total) under the tongue every 5 (five) minutes as needed for chest pain. (Patient not taking: Reported on 08/02/2019) 25 tablet 6  . OVER THE COUNTER MEDICATION Take 1 tablet by mouth daily as needed. Colace    . pantoprazole (PROTONIX) 40 MG tablet Take 40 mg daily by mouth.    Vladimir Faster Glycol-Propyl Glycol (SYSTANE) 0.4-0.3 % SOLN Place 1-2 drops into both eyes 3 (three) times daily as needed (dry/irritated eyes.).    Marland Kitchen prochlorperazine (COMPAZINE) 10 MG tablet Take 1 tablet (10 mg total) by mouth every 6 (six) hours as needed for nausea or vomiting. (Patient not taking: Reported on 08/02/2019) 30 tablet 2  . ramipril (ALTACE) 10 MG capsule Take 10 mg by mouth 2 (two) times daily.     . traMADol (ULTRAM) 50 MG tablet Take 1 tablet (50 mg total) by mouth every 6 (six) hours as needed. (Patient not taking: Reported on 08/02/2019) 20 tablet 0  . VENTOLIN HFA 108 (90 Base) MCG/ACT inhaler Inhale 1-2 puffs into the lungs every 6 (six) hours as needed for wheezing or shortness of breath.   0  . ZETIA 10 MG tablet Take 10 mg by mouth every evening.      No current facility-administered medications for this visit.    SURGICAL HISTORY:  Past Surgical History:  Procedure Laterality Date  . ABDOMINAL AORTAGRAM Left 09/16/2013   Procedure: ABDOMINAL Maxcine Ham;  Surgeon: Elam Dutch, MD;  Location: [facility] CATH LAB;  Service: Cardiovascular;  Laterality: Left;  . CARDIAC CATHETERIZATION  01/2011   When there was segmental stenosis of the distal RCA, patent PCA stent, patent circumflex stent, and a patent small diagonal with 90% ISR.   Marland Kitchen COLONOSCOPY W/ BIOPSIES AND POLYPECTOMY    . CORONARY ANGIOPLASTY  may 2002   Non-DES stenting of his circumflex, non-DES stenting of the RCA  . HYDROCELE EXCISION / REPAIR  2009   by Dr Janice Norrie  . INGUINAL HERNIA REPAIR Bilateral    Dr Bubba Camp  . LYMPH NODE BIOPSY Left 01/10/2019   Procedure: left supraclavicular LYMPH  NODE BIOPSY;  Surgeon: [name], MD;  Location: [facility];  Service: Thoracic;  Laterality: Left;  Marland Kitchen MULTIPLE TOOTH EXTRACTIONS    . NM MYOCAR PERF WALL MOTION  01/08/2011   moderate in size and intensity area of reversible ischemia in the basal to mid inferior and septal territories. Abnormal study  . stents  2008   proximal RCA, DES for progession of disease.  Marland Kitchen US ECHOCARDIOGRAPHY  01/12/2012   mild concentric LVH, borderline LA enlargement, mild to mod TR    REVIEW OF SYSTEMS:  A comprehensive review of systems was negative except for: Gastrointestinal: positive for dysphagia   PHYSICAL EXAMINATION: General appearance: alert, cooperative, fatigued and no distress Head: Normocephalic, without obvious abnormality, atraumatic Neck: no  adenopathy, no JVD, supple, symmetrical, trachea midline and thyroid not enlarged, symmetric, no tenderness/mass/nodules Lymph nodes: Cervical, supraclavicular, and axillary nodes normal. Resp: clear to auscultation bilaterally Back: symmetric, no curvature. ROM normal. No CVA tenderness. Cardio: regular rate and rhythm, S1, S2 normal, no murmur, click, rub or gallop GI: soft, non-tender; bowel sounds normal; no masses,  no organomegaly Extremities: extremities normal, atraumatic, no cyanosis or edema  ECOG PERFORMANCE STATUS: 1 - Symptomatic but completely ambulatory  Blood pressure 120/75, pulse 77, temperature 98.3 F (36.8 C), temperature source Temporal, resp. rate 18, height '5\' 9"'$  (1.753 m), weight 186 lb 6.4 oz (84.6 kg), SpO2 100 %.  LABORATORY DATA: Lab Results  Component Value Date   WBC 5.0 09/14/2019   HGB 10.7 (L) 09/14/2019   HCT 33.0 (L) 09/14/2019   MCV 108.9 (H) 09/14/2019   PLT 260 09/14/2019      Chemistry      Component Value Date/Time   NA 140 09/14/2019 1130   K 4.3 09/14/2019 1130   CL 104 09/14/2019 1130   CO2 24 09/14/2019 1130   BUN 16 09/14/2019 1130   CREATININE 1.12 09/14/2019 1130   CREATININE 0.94  05/16/2014 1109      Component Value Date/Time   CALCIUM 9.9 09/14/2019 1130   ALKPHOS 114 09/14/2019 1130   AST 37 09/14/2019 1130   ALT 45 (H) 09/14/2019 1130   BILITOT 0.3 09/14/2019 1130       RADIOGRAPHIC STUDIES: CT Chest W Contrast  Result Date: 08/22/2019 CLINICAL DATA:  Restaging lung cancer. EXAM: CT CHEST, ABDOMEN, AND PELVIS WITH CONTRAST TECHNIQUE: Multidetector CT imaging of the chest, abdomen and pelvis was performed following the standard protocol during bolus administration of intravenous contrast. CONTRAST:  152m OMNIPAQUE IOHEXOL 300 MG/ML  SOLN COMPARISON:  06/20/2019 FINDINGS: CT CHEST FINDINGS Cardiovascular: The heart is normal in size. No pericardial effusion. The aorta is normal in caliber. No dissection. Stable advanced atherosclerotic calcifications mainly at the aortic arch and branch vessel ostia. Stable significant three-vessel coronary artery calcifications. The pulmonary arteries are grossly normal. Mediastinum/Nodes: No mediastinal or hilar mass or adenopathy. The esophagus is grossly normal. Stable subcentimeter thyroid nodules. Lungs/Pleura: Stable appearance of the left upper lobe pulmonary nodule. Maximum diameter is 6 mm and was previously 7 mm. Stable radiation changes involving the left upper lobe. No new or progressive findings are identified. Stable emphysematous changes and areas of pulmonary scarring. No evidence of metastatic pulmonary nodules. Musculoskeletal: No supraclavicular or axillary adenopathy. The bony thorax is intact. No worrisome bone lesions. Stable degenerative changes involving the spine. CT ABDOMEN PELVIS FINDINGS Hepatobiliary: Stable scattered hepatic cysts. No worrisome hepatic lesions to suggest metastatic disease. The gallbladder is normal. No intra or extrahepatic biliary dilatation. Pancreas: No mass, inflammation or ductal dilatation. Spleen: Normal size. No focal lesions. Adrenals/Urinary Tract: Adrenal complete resolution of right  adrenal gland mass. The left adrenal gland is normal. Stable right renal cyst. No worrisome renal lesions. The bladder is unremarkable. Stomach/Bowel: The stomach, duodenum, small bowel and colon are unremarkable. No acute inflammatory changes, mass lesions or obstructive findings. The terminal ileum is normal. The appendix is normal. Stable colonic diverticulosis but no findings for acute diverticulitis. Vascular/Lymphatic: Stable advanced aortic and branch vessel calcifications but no aneurysm or dissection. Stable severe iliac artery atherosclerotic disease. No mesenteric or retroperitoneal mass or adenopathy. Reproductive: Mild prostate gland enlargement is stable. There is median lobe hypertrophy impressing on the base of the bladder. Other: No pelvic mass or adenopathy. No  free pelvic fluid collections. No inguinal mass or adenopathy. No abdominal wall hernia or subcutaneous lesions. Musculoskeletal: No significant bony findings. Stable advanced degenerative changes involving the lumbar spine. IMPRESSION: 1. Stable radiation changes involving the left upper lobe. Slightly smaller left upper lobe lesion now measuring 6 mm. No new or progressive findings. 2. Stable emphysematous changes and pulmonary scarring. No new pulmonary lesions. 3. No mediastinal or hilar mass or adenopathy. 4. Complete resolution of right adrenal gland mass. No findings for abdominal/pelvic metastatic disease. 5. Stable hepatic cysts. 6. Stable advanced atherosclerotic calcifications involving the thoracic and abdominal aorta and branch vessels including the coronary arteries. Aortic Atherosclerosis (ICD10-I70.0). Electronically Signed   By: Marijo Sanes M.D.   On: 08/22/2019 09:33   CT Abdomen Pelvis W Contrast  Result Date: 08/22/2019 CLINICAL DATA:  Restaging lung cancer. EXAM: CT CHEST, ABDOMEN, AND PELVIS WITH CONTRAST TECHNIQUE: Multidetector CT imaging of the chest, abdomen and pelvis was performed following the standard  protocol during bolus administration of intravenous contrast. CONTRAST:  158m OMNIPAQUE IOHEXOL 300 MG/ML  SOLN COMPARISON:  06/20/2019 FINDINGS: CT CHEST FINDINGS Cardiovascular: The heart is normal in size. No pericardial effusion. The aorta is normal in caliber. No dissection. Stable advanced atherosclerotic calcifications mainly at the aortic arch and branch vessel ostia. Stable significant three-vessel coronary artery calcifications. The pulmonary arteries are grossly normal. Mediastinum/Nodes: No mediastinal or hilar mass or adenopathy. The esophagus is grossly normal. Stable subcentimeter thyroid nodules. Lungs/Pleura: Stable appearance of the left upper lobe pulmonary nodule. Maximum diameter is 6 mm and was previously 7 mm. Stable radiation changes involving the left upper lobe. No new or progressive findings are identified. Stable emphysematous changes and areas of pulmonary scarring. No evidence of metastatic pulmonary nodules. Musculoskeletal: No supraclavicular or axillary adenopathy. The bony thorax is intact. No worrisome bone lesions. Stable degenerative changes involving the spine. CT ABDOMEN PELVIS FINDINGS Hepatobiliary: Stable scattered hepatic cysts. No worrisome hepatic lesions to suggest metastatic disease. The gallbladder is normal. No intra or extrahepatic biliary dilatation. Pancreas: No mass, inflammation or ductal dilatation. Spleen: Normal size. No focal lesions. Adrenals/Urinary Tract: Adrenal complete resolution of right adrenal gland mass. The left adrenal gland is normal. Stable right renal cyst. No worrisome renal lesions. The bladder is unremarkable. Stomach/Bowel: The stomach, duodenum, small bowel and colon are unremarkable. No acute inflammatory changes, mass lesions or obstructive findings. The terminal ileum is normal. The appendix is normal. Stable colonic diverticulosis but no findings for acute diverticulitis. Vascular/Lymphatic: Stable advanced aortic and branch vessel  calcifications but no aneurysm or dissection. Stable severe iliac artery atherosclerotic disease. No mesenteric or retroperitoneal mass or adenopathy. Reproductive: Mild prostate gland enlargement is stable. There is median lobe hypertrophy impressing on the base of the bladder. Other: No pelvic mass or adenopathy. No free pelvic fluid collections. No inguinal mass or adenopathy. No abdominal wall hernia or subcutaneous lesions. Musculoskeletal: No significant bony findings. Stable advanced degenerative changes involving the lumbar spine. IMPRESSION: 1. Stable radiation changes involving the left upper lobe. Slightly smaller left upper lobe lesion now measuring 6 mm. No new or progressive findings. 2. Stable emphysematous changes and pulmonary scarring. No new pulmonary lesions. 3. No mediastinal or hilar mass or adenopathy. 4. Complete resolution of right adrenal gland mass. No findings for abdominal/pelvic metastatic disease. 5. Stable hepatic cysts. 6. Stable advanced atherosclerotic calcifications involving the thoracic and abdominal aorta and branch vessels including the coronary arteries. Aortic Atherosclerosis (ICD10-I70.0). Electronically Signed   By: PRicky StabsD.  On: 08/22/2019 09:33   US ARTERIAL SEGMENTAL EXERCISE (USE FOR CLAUDICATION)  Result Date: 08/26/2019 CLINICAL DATA:  74 year old male with a history of multiple cardiovascular risk factors and pain in the legs EXAM: NONINVASIVE PHYSIOLOGIC VASCULAR STUDY OF BILATERAL LOWER EXTREMITIES TECHNIQUE: Evaluation of both lower extremities was performed at rest, including calculation of ankle-brachial indices, multiple segmental pressure evaluation, segmental Doppler and segmental pulse volume recording. COMPARISON:  None. FINDINGS: Right: Resting ankle brachial index:  0.94 Post exercise ABI measures 0.65 Segmental blood pressure: Upper extremity pressures are symmetric. Appropriate increased to the thigh. Subtle decreased in the segmental  pressure in the low thigh/calf. Doppler: Segmental Doppler demonstrates triphasic waveform of the femoropopliteal segment, posterior tibial artery. Monophasic dorsalis pedis. Pulse volume recording: Segmental PVR maintained throughout the right lower extremity. Left: Resting ankle brachial index: 0.86 Post exercise ABI measures 0.48 Segmental blood pressure: Segmental upper extremity pressure symmetric. Appropriate increased to the thigh. Doppler: Segmental Doppler demonstrates triphasic waveform of the femoral popliteal segment. Triphasic posterior tibial artery. Monophasic dorsalis pedis. Pulse volume recording: Segmental PVR demonstrates waveforms maintained, with loss of augmentation below knee. Additional: IMPRESSION: Right: Resting ABI within normal limits with the post exercise demonstrating decrease to the moderate range arterial occlusive disease. Segmental exam suggests developing femoropopliteal and tibial disease. Left: Resting ABI in the mild range arterial occlusive disease with the post exercise exam demonstrating decreased to the moderate range. Segmental exam demonstrates developing femoropopliteal and tibial disease. Signed, Dulcy Fanny. Dellia Nims, RPVI [facility] Electronically Signed   By: Corrie Mckusick D.O.   On: 08/26/2019 13:06    ASSESSMENT AND PLAN: This is a very pleasant 74 years old African-American male with stage IV non-small cell lung cancer, adenocarcinoma with no actionable mutation and PD-L1 expression of 30%, presented with locally advanced disease in the chest as well as solitary metastasis to the right adrenal gland. The patient completed a course of concurrent chemoradiation to the chest with weekly carboplatin for AUC of 2 and paclitaxel 45 mg/M2 status post 7 cycles..  The patient also received palliative radiotherapy to the adrenal glands. He is currently undergoing systemic chemotherapy with carboplatin, Alimta  and Keytruda status post 7 cycles.  The patient continues to tolerate his treatment well with no concerning adverse effects. I recommended for the patient to proceed with cycle #8 today as planned. He will come back for follow-up visit in 3 weeks for evaluation before the next cycle of his treatment. For the depression and lack of appetite, I will start the patient on Remeron 30 mg p.o. nightly. The patient was advised to call immediately if he has any concerning symptoms in the interval. The patient voices understanding of current disease status and treatment options and is in agreement with the current care plan. All questions were answered. The patient knows to call the clinic with any problems, questions or concerns. We can certainly see the patient much sooner if necessary.   Disclaimer: This note was dictated with voice recognition software. Similar sounding words can inadvertently be transcribed and may not be corrected upon review.

## 2019-09-29 NOTE — Progress Notes (Signed)
Pharmacist Chemotherapy Monitoring - Follow Up Assessment    I verify that I have reviewed each item in the below checklist:  . Regimen for the patient is scheduled for the appropriate day and plan matches scheduled date. Marland Kitchen Appropriate non-routine labs are ordered dependent on drug ordered. . If applicable, additional medications reviewed and ordered per protocol based on lifetime cumulative doses and/or treatment regimen.   Plan for follow-up and/or issues identified: No . I-vent associated with next due treatment: No . MD and/or nursing notified: No  Erlean Mealor D 09/29/2019 4:36 PM

## 2019-10-05 ENCOUNTER — Inpatient Hospital Stay: Payer: Medicare Other

## 2019-10-05 ENCOUNTER — Inpatient Hospital Stay: Payer: Medicare Other | Attending: Internal Medicine | Admitting: Internal Medicine

## 2019-10-05 ENCOUNTER — Other Ambulatory Visit: Payer: Self-pay

## 2019-10-05 ENCOUNTER — Encounter: Payer: Self-pay | Admitting: Internal Medicine

## 2019-10-05 VITALS — BP 126/78 | HR 71 | Temp 98.6°F | Resp 18 | Ht 69.0 in | Wt 185.9 lb

## 2019-10-05 DIAGNOSIS — Z5112 Encounter for antineoplastic immunotherapy: Secondary | ICD-10-CM | POA: Diagnosis not present

## 2019-10-05 DIAGNOSIS — Z5111 Encounter for antineoplastic chemotherapy: Secondary | ICD-10-CM | POA: Insufficient documentation

## 2019-10-05 DIAGNOSIS — C7971 Secondary malignant neoplasm of right adrenal gland: Secondary | ICD-10-CM | POA: Diagnosis not present

## 2019-10-05 DIAGNOSIS — C3490 Malignant neoplasm of unspecified part of unspecified bronchus or lung: Secondary | ICD-10-CM

## 2019-10-05 DIAGNOSIS — C3412 Malignant neoplasm of upper lobe, left bronchus or lung: Secondary | ICD-10-CM

## 2019-10-05 DIAGNOSIS — C7989 Secondary malignant neoplasm of other specified sites: Secondary | ICD-10-CM | POA: Diagnosis not present

## 2019-10-05 DIAGNOSIS — I10 Essential (primary) hypertension: Secondary | ICD-10-CM | POA: Diagnosis not present

## 2019-10-05 DIAGNOSIS — Z79899 Other long term (current) drug therapy: Secondary | ICD-10-CM | POA: Insufficient documentation

## 2019-10-05 LAB — CBC WITH DIFFERENTIAL (CANCER CENTER ONLY)
Abs Immature Granulocytes: 0.01 10*3/uL (ref 0.00–0.07)
Basophils Absolute: 0 10*3/uL (ref 0.0–0.1)
Basophils Relative: 1 %
Eosinophils Absolute: 0.1 10*3/uL (ref 0.0–0.5)
Eosinophils Relative: 2 %
HCT: 32.5 % — ABNORMAL LOW (ref 39.0–52.0)
Hemoglobin: 10.6 g/dL — ABNORMAL LOW (ref 13.0–17.0)
Immature Granulocytes: 0 %
Lymphocytes Relative: 15 %
Lymphs Abs: 0.5 10*3/uL — ABNORMAL LOW (ref 0.7–4.0)
MCH: 35.5 pg — ABNORMAL HIGH (ref 26.0–34.0)
MCHC: 32.6 g/dL (ref 30.0–36.0)
MCV: 108.7 fL — ABNORMAL HIGH (ref 80.0–100.0)
Monocytes Absolute: 0.6 10*3/uL (ref 0.1–1.0)
Monocytes Relative: 19 %
Neutro Abs: 2 10*3/uL (ref 1.7–7.7)
Neutrophils Relative %: 63 %
Platelet Count: 274 10*3/uL (ref 150–400)
RBC: 2.99 MIL/uL — ABNORMAL LOW (ref 4.22–5.81)
RDW: 14.2 % (ref 11.5–15.5)
WBC Count: 3.2 10*3/uL — ABNORMAL LOW (ref 4.0–10.5)
nRBC: 0 % (ref 0.0–0.2)

## 2019-10-05 LAB — CMP (CANCER CENTER ONLY)
ALT: 57 U/L — ABNORMAL HIGH (ref 0–44)
AST: 40 U/L (ref 15–41)
Albumin: 3.2 g/dL — ABNORMAL LOW (ref 3.5–5.0)
Alkaline Phosphatase: 107 U/L (ref 38–126)
Anion gap: 11 (ref 5–15)
BUN: 15 mg/dL (ref 8–23)
CO2: 24 mmol/L (ref 22–32)
Calcium: 9.7 mg/dL (ref 8.9–10.3)
Chloride: 108 mmol/L (ref 98–111)
Creatinine: 1.1 mg/dL (ref 0.61–1.24)
GFR, Est AFR Am: >60 mL/min (ref >60)
GFR, Estimated: >60 mL/min (ref >60)
Glucose, Bld: 139 mg/dL — ABNORMAL HIGH (ref 70–99)
Potassium: 3.9 mmol/L (ref 3.5–5.1)
Sodium: 143 mmol/L (ref 135–145)
Total Bilirubin: <0.2 mg/dL — ABNORMAL LOW (ref 0.3–1.2)
Total Protein: 7.2 g/dL (ref 6.5–8.1)

## 2019-10-05 LAB — TSH: TSH: 1.003 u[IU]/mL (ref 0.320–4.118)

## 2019-10-05 MED ORDER — PROCHLORPERAZINE MALEATE 10 MG PO TABS
ORAL_TABLET | ORAL | Status: AC
  Filled 2019-10-05: qty 1

## 2019-10-05 MED ORDER — SODIUM CHLORIDE 0.9 % IV SOLN
200.0000 mg | Freq: Once | INTRAVENOUS | Status: AC
  Administered 2019-10-05: 200 mg via INTRAVENOUS
  Filled 2019-10-05: qty 8

## 2019-10-05 MED ORDER — PROCHLORPERAZINE MALEATE 10 MG PO TABS
10.0000 mg | ORAL_TABLET | Freq: Once | ORAL | Status: AC
  Administered 2019-10-05: 10 mg via ORAL

## 2019-10-05 MED ORDER — CYANOCOBALAMIN 1000 MCG/ML IJ SOLN
INTRAMUSCULAR | Status: AC
  Filled 2019-10-05: qty 1

## 2019-10-05 MED ORDER — SODIUM CHLORIDE 0.9 % IV SOLN
Freq: Once | INTRAVENOUS | Status: AC
  Filled 2019-10-05: qty 250

## 2019-10-05 MED ORDER — CYANOCOBALAMIN 1000 MCG/ML IJ SOLN
1000.0000 ug | Freq: Once | INTRAMUSCULAR | Status: AC
  Administered 2019-10-05: 1000 ug via INTRAMUSCULAR

## 2019-10-05 MED ORDER — SODIUM CHLORIDE 0.9 % IV SOLN
500.0000 mg/m2 | Freq: Once | INTRAVENOUS | Status: AC
  Administered 2019-10-05: 1000 mg via INTRAVENOUS
  Filled 2019-10-05: qty 40

## 2019-10-05 NOTE — Progress Notes (Signed)
[place] [phone]   [phone]  OFFICE PROGRESS NOTE  [name], MD [address] [place] [address]  DIAGNOSIS:  Stage IV (T1b, N3, M1c) non-small cell lung cancer, adenocarcinoma. 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  Biomarker Findings Tumor Mutational Burden - 10 Muts/Mb Microsatellite status - MS-Stable Genomic Findings For a complete list of the genes assayed, please refer to the Appendix. ERBB2 amplification - equivocal? IOE70 J50* FANCC splice site 093+8H>W SMAD4 Q289* TP53 E298* 7 Disease relevant genes with no reportable alterations: ALK, BRAF, EGFR, KRAS, MET, RET, ROS1   PDL1 expression is 30%.  PRIOR THERAPY: 1) Weekly concurrent chemoradiation with carboplatin for an AUC of 2 and paclitaxel 45 mg/m2 to the locally advanced disease in the chest. He started the first dose 01/31/2019. Status post7cycles.Last dose of chemotherapy was given on March 14, 2019. 2) palliative radiotherapy to the metastatic disease in the right adrenal gland.  CURRENT THERAPY: Systemic chemotherapy with carboplatin for AUC of 5, Alimta 500 mg/M2 and Keytruda 200 mg IV every 3 weeks. First dose April 20, 2019. Status post 8 cycles.   Starting from cycle #5, the patient is on maintenance treatment with Alimta and Keytruda every 3 weeks.  INTERVAL HISTORY: Trevor Bailey 74 y.o. male returns to the clinic today for follow-up visit.  The patient is feeling fine today with no concerning complaints.  He denied having any current chest pain, shortness of breath, cough or hemoptysis.  He denied having any fever or chills.  He has no nausea, vomiting, diarrhea or constipation.  He has no headache or visual changes.  He has no significant weight loss or  night sweats.  The patient has been tolerating his treatment with maintenance Alimta and Keytruda fairly well.  Is here today for evaluation before starting cycle #9 of his treatment.  MEDICAL HISTORY: Past Medical History:  Diagnosis Date  . Adenopathy    left supraclavicular lymph node  . Allergies   . COPD (chronic obstructive pulmonary disease) ([place])   . Coronary disease   . Diabetes ([place])   . Enlarged prostate   . GERD (gastroesophageal reflux disease)   . Glaucoma   . Hyperlipidemia   . Hypertension   . Myocardial infarction ([place])   . Sleep apnea    wears CPAP  . Wears partial dentures    upper and lower    ALLERGIES:  has No Known Allergies.  MEDICATIONS:  Current Outpatient Medications  Medication Sig Dispense Refill  . amLODipine (NORVASC) 5 MG tablet TAKE 1 TABLET BY MOUTH  DAILY 90 tablet 1  . aspirin EC 81 MG tablet Take 81 mg by mouth daily.    Marland Kitchen atorvastatin (LIPITOR) 80 MG tablet Take 1 tablet (80 mg total) by mouth daily. 90 tablet 3  . augmented betamethasone dipropionate (DIPROLENE-AF) 0.05 % cream     . CLARITIN 10 MG tablet Take 10 mg by mouth daily.     . clopidogrel (PLAVIX) 75 MG tablet Take 1 tablet (75 mg total) by mouth every evening. 30 tablet 0  . finasteride (PROSCAR) 5 MG tablet Take 5 mg by mouth daily.     . folic acid (FOLVITE) 1 MG tablet Take 1 tablet (1 mg total) by mouth daily. 90 tablet 1  . hydrochlorothiazide (HYDRODIURIL)  25 MG tablet Take 25 mg by mouth daily.     Marland Kitchen latanoprost (XALATAN) 0.005 % ophthalmic solution Place 1 drop into both eyes at bedtime.     . metFORMIN (GLUCOPHAGE) 1000 MG tablet Take 1,000 mg by mouth 2 (two) times daily.     . metoprolol tartrate (LOPRESSOR) 25 MG tablet Take 25 mg 2 (two) times daily by mouth.    . mirtazapine (REMERON) 30 MG tablet Take 1 tablet (30 mg total) by mouth at bedtime. 30 tablet 2  . Multiple Vitamin (MULTIVITAMIN WITH MINERALS) TABS tablet Take 1 tablet by mouth daily. Centrum Silver     . nitroGLYCERIN (NITROSTAT) 0.4 MG SL tablet Place 1 tablet (0.4 mg total) under the tongue every 5 (five) minutes as needed for chest pain. (Patient not taking: Reported on 08/02/2019) 25 tablet 6  . OVER THE COUNTER MEDICATION Take 1 tablet by mouth daily as needed. Colace    . pantoprazole (PROTONIX) 40 MG tablet Take 40 mg daily by mouth.    Vladimir Faster Glycol-Propyl Glycol (SYSTANE) 0.4-0.3 % SOLN Place 1-2 drops into both eyes 3 (three) times daily as needed (dry/irritated eyes.).    Marland Kitchen prochlorperazine (COMPAZINE) 10 MG tablet Take 1 tablet (10 mg total) by mouth every 6 (six) hours as needed for nausea or vomiting. (Patient not taking: Reported on 08/02/2019) 30 tablet 2  . ramipril (ALTACE) 10 MG capsule Take 10 mg by mouth 2 (two) times daily.     . traMADol (ULTRAM) 50 MG tablet Take 1 tablet (50 mg total) by mouth every 6 (six) hours as needed. (Patient not taking: Reported on 08/02/2019) 20 tablet 0  . VENTOLIN HFA 108 (90 Base) MCG/ACT inhaler Inhale 1-2 puffs into the lungs every 6 (six) hours as needed for wheezing or shortness of breath.   0  . ZETIA 10 MG tablet Take 10 mg by mouth every evening.      No current facility-administered medications for this visit.    SURGICAL HISTORY:  Past Surgical History:  Procedure Laterality Date  . ABDOMINAL AORTAGRAM Left 09/16/2013   Procedure: ABDOMINAL Maxcine Ham;  Surgeon: Elam Dutch, MD;  Location: [facility] CATH LAB;  Service: Cardiovascular;  Laterality: Left;  . CARDIAC CATHETERIZATION  01/2011   When there was segmental stenosis of the distal RCA, patent PCA stent, patent circumflex stent, and a patent small diagonal with 90% ISR.   Marland Kitchen COLONOSCOPY W/ BIOPSIES AND POLYPECTOMY    . CORONARY ANGIOPLASTY  may 2002   Non-DES stenting of his circumflex, non-DES stenting of the RCA  . HYDROCELE EXCISION / REPAIR  2009   by Dr Janice Norrie  . INGUINAL HERNIA REPAIR Bilateral    Dr Bubba Camp  . LYMPH NODE BIOPSY Left 01/10/2019   Procedure: left  supraclavicular LYMPH NODE BIOPSY;  Surgeon: [name], MD;  Location: [facility];  Service: Thoracic;  Laterality: Left;  Marland Kitchen MULTIPLE TOOTH EXTRACTIONS    . NM MYOCAR PERF WALL MOTION  01/08/2011   moderate in size and intensity area of reversible ischemia in the basal to mid inferior and septal territories. Abnormal study  . stents  2008   proximal RCA, DES for progession of disease.  Marland Kitchen US ECHOCARDIOGRAPHY  01/12/2012   mild concentric LVH, borderline LA enlargement, mild to mod TR    REVIEW OF SYSTEMS:  A comprehensive review of systems was negative.   PHYSICAL EXAMINATION: General appearance: alert, cooperative and no distress Head: Normocephalic, without obvious abnormality, atraumatic Neck: no adenopathy,  no JVD, supple, symmetrical, trachea midline and thyroid not enlarged, symmetric, no tenderness/mass/nodules Lymph nodes: Cervical, supraclavicular, and axillary nodes normal. Resp: clear to auscultation bilaterally Back: symmetric, no curvature. ROM normal. No CVA tenderness. Cardio: regular rate and rhythm, S1, S2 normal, no murmur, click, rub or gallop GI: soft, non-tender; bowel sounds normal; no masses,  no organomegaly Extremities: extremities normal, atraumatic, no cyanosis or edema  ECOG PERFORMANCE STATUS: 1 - Symptomatic but completely ambulatory  Blood pressure 126/78, pulse 71, temperature 98.6 F (37 C), temperature source Temporal, resp. rate 18, height _0  (1.753 m), weight 185 lb 14.4 oz (84.3 kg), SpO2 100 %.  LABORATORY DATA: Lab Results  Component Value Date   WBC 3.2 (L) 10/05/2019   HGB 10.6 (L) 10/05/2019   HCT 32.5 (L) 10/05/2019   MCV 108.7 (H) 10/05/2019   PLT 274 10/05/2019      Chemistry      Component Value Date/Time   NA 140 09/14/2019 1130   K 4.3 09/14/2019 1130   CL 104 09/14/2019 1130   CO2 24 09/14/2019 1130   BUN 16 09/14/2019 1130   CREATININE 1.12 09/14/2019 1130   CREATININE 0.94 05/16/2014 1109      Component Value  Date/Time   CALCIUM 9.9 09/14/2019 1130   ALKPHOS 114 09/14/2019 1130   AST 37 09/14/2019 1130   ALT 45 (H) 09/14/2019 1130   BILITOT 0.3 09/14/2019 1130       RADIOGRAPHIC STUDIES: No results found.  ASSESSMENT AND PLAN: This is a very pleasant 74 years old African-American male with stage IV non-small cell lung cancer, adenocarcinoma with no actionable mutation and PD-L1 expression of 30%, presented with locally advanced disease in the chest as well as solitary metastasis to the right adrenal gland. The patient completed a course of concurrent chemoradiation to the chest with weekly carboplatin for AUC of 2 and paclitaxel 45 mg/M2 status post 7 cycles..  The patient also received palliative radiotherapy to the adrenal glands. He is currently undergoing systemic chemotherapy with carboplatin, Alimta and Keytruda status post 8 cycles.  The patient continues to tolerate this treatment well with no concerning adverse effects. I recommended for him to proceed with cycle #9 today as planned. He will come back for follow-up visit in 3 weeks for evaluation with repeat CT scan of the chest, abdomen pelvis for restaging of his disease. For the depression and lack of appetite, I will start the patient on Remeron 30 mg p.o. nightly. The patient was advised to call immediately if he has any concerning symptoms in the interval. The patient voices understanding of current disease status and treatment options and is in agreement with the current care plan. All questions were answered. The patient knows to call the clinic with any problems, questions or concerns. We can certainly see the patient much sooner if necessary.   Disclaimer: This note was dictated with voice recognition software. Similar sounding words can inadvertently be transcribed and may not be corrected upon review.

## 2019-10-05 NOTE — Patient Instructions (Signed)
[facility] Discharge Instructions for Patients Receiving Chemotherapy  Today you received the following chemotherapy agents Keytruda and Alimta  To help prevent nausea and vomiting after your treatment, we encourage you to take your nausea medication as directed.   If you develop nausea and vomiting that is not controlled by your nausea medication, call the clinic.   BELOW ARE SYMPTOMS THAT SHOULD BE REPORTED IMMEDIATELY:  *FEVER GREATER THAN 100.5 F  *CHILLS WITH OR WITHOUT FEVER  NAUSEA AND VOMITING THAT IS NOT CONTROLLED WITH YOUR NAUSEA MEDICATION  *UNUSUAL SHORTNESS OF BREATH  *UNUSUAL BRUISING OR BLEEDING  TENDERNESS IN MOUTH AND THROAT WITH OR WITHOUT PRESENCE OF ULCERS  *URINARY PROBLEMS  *BOWEL PROBLEMS  UNUSUAL RASH Items with * indicate a potential emergency and should be followed up as soon as possible.  Feel free to call the clinic should you have any questions or concerns. The clinic phone number is [phone].  Please show the CHEMO ALERT CARD at check-in to the Emergency Department and triage nurse.  Cyanocobalamin, Vitamin B12 injection What is this medicine? CYANOCOBALAMIN (sye an oh koe BAL a min) is a man made form of vitamin B12. Vitamin B12 is used in the growth of healthy blood cells, nerve cells, and proteins in the body. It also helps with the metabolism of fats and carbohydrates. This medicine is used to treat people who can not absorb vitamin B12. This medicine may be used for other purposes; ask your health care provider or pharmacist if you have questions. COMMON BRAND NAME(S): B-12 Compliance Kit, B-12 Injection Kit, Cyomin, LA-12, Nutri-Twelve, Physicians EZ Use B-12, Primabalt What should I tell my health care provider before I take this medicine? They need to know if you have any of these conditions: kidney disease Leber's disease megaloblastic anemia an unusual or allergic reaction to cyanocobalamin, cobalt, other  medicines, foods, dyes, or preservatives pregnant or trying to get pregnant breast-feeding How should I use this medicine? This medicine is injected into a muscle or deeply under the skin. It is usually given by a health care professional in a clinic or doctor's office. However, your doctor may teach you how to inject yourself. Follow all instructions. Talk to your pediatrician regarding the use of this medicine in children. Special care may be needed. Overdosage: If you think you have taken too much of this medicine contact a poison control center or emergency room at once. NOTE: This medicine is only for you. Do not share this medicine with others. What if I miss a dose? If you are given your dose at a clinic or doctor's office, call to reschedule your appointment. If you give your own injections and you miss a dose, take it as soon as you can. If it is almost time for your next dose, take only that dose. Do not take double or extra doses. What may interact with this medicine? colchicine heavy alcohol intake This list may not describe all possible interactions. Give your health care provider a list of all the medicines, herbs, non-prescription drugs, or dietary supplements you use. Also tell them if you smoke, drink alcohol, or use illegal drugs. Some items may interact with your medicine. What should I watch for while using this medicine? Visit your doctor or health care professional regularly. You may need blood work done while you are taking this medicine. You may need to follow a special diet. Talk to your doctor. Limit your alcohol intake and avoid smoking to get the best   benefit. What side effects may I notice from receiving this medicine? Side effects that you should report to your doctor or health care professional as soon as possible: allergic reactions like skin rash, itching or hives, swelling of the face, lips, or tongue blue tint to skin chest tightness, pain difficulty  breathing, wheezing dizziness red, swollen painful area on the leg Side effects that usually do not require medical attention (report to your doctor or health care professional if they continue or are bothersome): diarrhea headache This list may not describe all possible side effects. Call your doctor for medical advice about side effects. You may report side effects to FDA at 1-800-FDA-1088. Where should I keep my medicine? Keep out of the reach of children. Store at room temperature between 15 and 30 degrees C (59 and 85 degrees F). Protect from light. Throw away any unused medicine after the expiration date. NOTE: This sheet is a summary. It may not cover all possible information. If you have questions about this medicine, talk to your doctor, pharmacist, or health care provider.  2020 Elsevier/Gold Standard (2007-08-16 22:10:20)  

## 2019-10-10 ENCOUNTER — Telehealth: Payer: Self-pay | Admitting: Internal Medicine

## 2019-10-10 NOTE — Telephone Encounter (Signed)
Scheduled per los. Called and left msg. Mailed printout  °

## 2019-10-25 ENCOUNTER — Encounter (HOSPITAL_COMMUNITY): Payer: Self-pay

## 2019-10-25 ENCOUNTER — Other Ambulatory Visit: Payer: Self-pay

## 2019-10-25 ENCOUNTER — Ambulatory Visit (HOSPITAL_COMMUNITY)
Admission: RE | Admit: 2019-10-25 | Discharge: 2019-10-25 | Disposition: A | Discharge status: Home / Self Care | Payer: Medicare Other | Source: Ambulatory Visit | Attending: Internal Medicine | Admitting: Internal Medicine

## 2019-10-25 DIAGNOSIS — C3490 Malignant neoplasm of unspecified part of unspecified bronchus or lung: Secondary | ICD-10-CM | POA: Diagnosis present

## 2019-10-25 MED ORDER — SODIUM CHLORIDE (PF) 0.9 % IJ SOLN
INTRAMUSCULAR | Status: AC
  Filled 2019-10-25: qty 50

## 2019-10-25 MED ORDER — IOHEXOL 300 MG/ML  SOLN
100.0000 mL | Freq: Once | INTRAMUSCULAR | Status: AC | PRN
  Administered 2019-10-25: 100 mL via INTRAVENOUS

## 2019-10-26 ENCOUNTER — Inpatient Hospital Stay: Payer: Medicare Other | Attending: Internal Medicine | Admitting: Internal Medicine

## 2019-10-26 ENCOUNTER — Inpatient Hospital Stay: Payer: Medicare Other

## 2019-10-26 ENCOUNTER — Other Ambulatory Visit: Payer: Self-pay | Admitting: Internal Medicine

## 2019-10-26 ENCOUNTER — Encounter: Payer: Self-pay | Admitting: Internal Medicine

## 2019-10-26 ENCOUNTER — Other Ambulatory Visit: Payer: Self-pay

## 2019-10-26 VITALS — BP 138/79 | HR 86 | Temp 97.5°F | Resp 18 | Ht 69.0 in | Wt 185.3 lb

## 2019-10-26 DIAGNOSIS — I10 Essential (primary) hypertension: Secondary | ICD-10-CM

## 2019-10-26 DIAGNOSIS — Z5111 Encounter for antineoplastic chemotherapy: Secondary | ICD-10-CM | POA: Diagnosis present

## 2019-10-26 DIAGNOSIS — C3412 Malignant neoplasm of upper lobe, left bronchus or lung: Secondary | ICD-10-CM | POA: Diagnosis present

## 2019-10-26 DIAGNOSIS — Z5112 Encounter for antineoplastic immunotherapy: Secondary | ICD-10-CM | POA: Diagnosis not present

## 2019-10-26 DIAGNOSIS — C3490 Malignant neoplasm of unspecified part of unspecified bronchus or lung: Secondary | ICD-10-CM | POA: Diagnosis not present

## 2019-10-26 DIAGNOSIS — C7971 Secondary malignant neoplasm of right adrenal gland: Secondary | ICD-10-CM | POA: Diagnosis not present

## 2019-10-26 DIAGNOSIS — Z79899 Other long term (current) drug therapy: Secondary | ICD-10-CM | POA: Diagnosis not present

## 2019-10-26 LAB — CBC WITH DIFFERENTIAL (CANCER CENTER ONLY)
Abs Immature Granulocytes: 0.01 10*3/uL (ref 0.00–0.07)
Basophils Absolute: 0 10*3/uL (ref 0.0–0.1)
Basophils Relative: 1 %
Eosinophils Absolute: 0.1 10*3/uL (ref 0.0–0.5)
Eosinophils Relative: 1 %
HCT: 32.5 % — ABNORMAL LOW (ref 39.0–52.0)
Hemoglobin: 10.8 g/dL — ABNORMAL LOW (ref 13.0–17.0)
Immature Granulocytes: 0 %
Lymphocytes Relative: 11 %
Lymphs Abs: 0.5 10*3/uL — ABNORMAL LOW (ref 0.7–4.0)
MCH: 35.2 pg — ABNORMAL HIGH (ref 26.0–34.0)
MCHC: 33.2 g/dL (ref 30.0–36.0)
MCV: 105.9 fL — ABNORMAL HIGH (ref 80.0–100.0)
Monocytes Absolute: 0.6 10*3/uL (ref 0.1–1.0)
Monocytes Relative: 12 %
Neutro Abs: 3.7 10*3/uL (ref 1.7–7.7)
Neutrophils Relative %: 75 %
Platelet Count: 290 10*3/uL (ref 150–400)
RBC: 3.07 MIL/uL — ABNORMAL LOW (ref 4.22–5.81)
RDW: 13.9 % (ref 11.5–15.5)
WBC Count: 4.8 10*3/uL (ref 4.0–10.5)
nRBC: 0 % (ref 0.0–0.2)

## 2019-10-26 LAB — CMP (CANCER CENTER ONLY)
ALT: 38 U/L (ref 0–44)
AST: 37 U/L (ref 15–41)
Albumin: 3.5 g/dL (ref 3.5–5.0)
Alkaline Phosphatase: 115 U/L (ref 38–126)
Anion gap: 14 (ref 5–15)
BUN: 15 mg/dL (ref 8–23)
CO2: 22 mmol/L (ref 22–32)
Calcium: 9.9 mg/dL (ref 8.9–10.3)
Chloride: 106 mmol/L (ref 98–111)
Creatinine: 1.23 mg/dL (ref 0.61–1.24)
GFR, Est AFR Am: >60 mL/min
GFR, Estimated: 57 mL/min — ABNORMAL LOW
Glucose, Bld: 113 mg/dL — ABNORMAL HIGH (ref 70–99)
Potassium: 4.2 mmol/L (ref 3.5–5.1)
Sodium: 142 mmol/L (ref 135–145)
Total Bilirubin: 0.4 mg/dL (ref 0.3–1.2)
Total Protein: 7.6 g/dL (ref 6.5–8.1)

## 2019-10-26 LAB — TSH: TSH: 0.608 u[IU]/mL (ref 0.320–4.118)

## 2019-10-26 MED ORDER — PROCHLORPERAZINE MALEATE 10 MG PO TABS
ORAL_TABLET | ORAL | Status: AC
  Filled 2019-10-26: qty 1

## 2019-10-26 MED ORDER — SODIUM CHLORIDE 0.9 % IV SOLN
500.0000 mg/m2 | Freq: Once | INTRAVENOUS | Status: AC
  Administered 2019-10-26: 1000 mg via INTRAVENOUS
  Filled 2019-10-26: qty 40

## 2019-10-26 MED ORDER — SODIUM CHLORIDE 0.9 % IV SOLN
Freq: Once | INTRAVENOUS | Status: AC
  Filled 2019-10-26: qty 250

## 2019-10-26 MED ORDER — SODIUM CHLORIDE 0.9 % IV SOLN
200.0000 mg | Freq: Once | INTRAVENOUS | Status: AC
  Administered 2019-10-26: 200 mg via INTRAVENOUS
  Filled 2019-10-26: qty 8

## 2019-10-26 MED ORDER — PROCHLORPERAZINE MALEATE 10 MG PO TABS
10.0000 mg | ORAL_TABLET | Freq: Once | ORAL | Status: AC
  Administered 2019-10-26: 10 mg via ORAL

## 2019-10-26 NOTE — Patient Instructions (Signed)
[facility] Discharge Instructions for Patients Receiving Chemotherapy  Today you received the following chemotherapy agents Pembrolizumab (KEYTRUDA) & Pemetrexed (ALIMTA).  To help prevent nausea and vomiting after your treatment, we encourage you to take your nausea medication as prescribed.   If you develop nausea and vomiting that is not controlled by your nausea medication, call the clinic.   BELOW ARE SYMPTOMS THAT SHOULD BE REPORTED IMMEDIATELY:  *FEVER GREATER THAN 100.5 F  *CHILLS WITH OR WITHOUT FEVER  NAUSEA AND VOMITING THAT IS NOT CONTROLLED WITH YOUR NAUSEA MEDICATION  *UNUSUAL SHORTNESS OF BREATH  *UNUSUAL BRUISING OR BLEEDING  TENDERNESS IN MOUTH AND THROAT WITH OR WITHOUT PRESENCE OF ULCERS  *URINARY PROBLEMS  *BOWEL PROBLEMS  UNUSUAL RASH Items with * indicate a potential emergency and should be followed up as soon as possible.  Feel free to call the clinic should you have any questions or concerns. The clinic phone number is [phone].  Please show the [place] at check-in to the Emergency Department and triage nurse.

## 2019-10-26 NOTE — Progress Notes (Signed)
[facility] [phone]   [phone]  OFFICE PROGRESS NOTE  [name], MD [address] [place] [address]  DIAGNOSIS:  Stage IV (T1b, N3, M1c) non-small cell lung cancer, adenocarcinoma. 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  Biomarker Findings Tumor Mutational Burden - 10 Muts/Mb Microsatellite status - MS-Stable Genomic Findings For a complete list of the genes assayed, please refer to the Appendix. ERBB2 amplification - equivocal? QIW97 L89* FANCC splice site [id number]>R SMAD4 Q289* TP53 E298* 7 Disease relevant genes with no reportable alterations: ALK, BRAF, EGFR, KRAS, MET, RET, ROS1   PDL1 expression is 30%.  PRIOR THERAPY: 1) Weekly concurrent chemoradiation with carboplatin for an AUC of 2 and paclitaxel 45 mg/m2 to the locally advanced disease in the chest. He started the first dose 01/31/2019. Status post7cycles.Last dose of chemotherapy was given on March 14, 2019. 2) palliative radiotherapy to the metastatic disease in the right adrenal gland.  CURRENT THERAPY: Systemic chemotherapy with carboplatin for AUC of 5, Alimta 500 mg/M2 and Keytruda 200 mg IV every 3 weeks. First dose April 20, 2019. Status post 9 cycles.   Starting from cycle #5, the patient is on maintenance treatment with Alimta and Keytruda every 3 weeks.  INTERVAL HISTORY: Trevor Bailey 74 y.o. male returns to the clinic today for follow-up visit.  The patient is feeling fine today with no concerning complaints.  He denied having any current nausea, vomiting, diarrhea or constipation.  He has no chest pain, shortness of breath, cough or hemoptysis.  He has no fever or chills.  He denied having any headache or visual changes.  He has no weight loss or night  sweats.  The patient denied having any pain.  He has been tolerating his maintenance treatment with Alimta and Keytruda fairly well.  Is here today for evaluation and repeat CT scan of the chest, abdomen pelvis for restaging of his disease.  MEDICAL HISTORY: Past Medical History:  Diagnosis Date  . Adenopathy    left supraclavicular lymph node  . Allergies   . COPD (chronic obstructive pulmonary disease) ([place])   . Coronary disease   . Diabetes ([place])   . Enlarged prostate   . GERD (gastroesophageal reflux disease)   . Glaucoma   . Hyperlipidemia   . Hypertension   . Myocardial infarction ([place])   . Sleep apnea    wears CPAP  . Wears partial dentures    upper and lower    ALLERGIES:  has No Known Allergies.  MEDICATIONS:  Current Outpatient Medications  Medication Sig Dispense Refill  . amLODipine (NORVASC) 5 MG tablet TAKE 1 TABLET BY MOUTH  DAILY 90 tablet 1  . aspirin EC 81 MG tablet Take 81 mg by mouth daily.    Marland Kitchen atorvastatin (LIPITOR) 80 MG tablet Take 1 tablet (80 mg total) by mouth daily. 90 tablet 3  . augmented betamethasone dipropionate (DIPROLENE-AF) 0.05 % cream     . CLARITIN 10 MG tablet Take 10 mg by mouth daily.     . clopidogrel (PLAVIX) 75 MG tablet Take 1 tablet (75 mg total) by mouth every evening. 30 tablet 0  . finasteride (PROSCAR) 5 MG tablet Take 5 mg by mouth daily.     . folic acid (FOLVITE) 1 MG tablet Take 1 tablet (1  mg total) by mouth daily. 90 tablet 1  . hydrochlorothiazide (HYDRODIURIL) 25 MG tablet Take 25 mg by mouth daily.     Marland Kitchen latanoprost (XALATAN) 0.005 % ophthalmic solution Place 1 drop into both eyes at bedtime.     . metFORMIN (GLUCOPHAGE) 1000 MG tablet Take 1,000 mg by mouth 2 (two) times daily.     . metoprolol tartrate (LOPRESSOR) 25 MG tablet Take 25 mg 2 (two) times daily by mouth.    . mirtazapine (REMERON) 30 MG tablet TAKE 1 TABLET BY MOUTH AT  BEDTIME 90 tablet 3  . Multiple Vitamin (MULTIVITAMIN WITH MINERALS) TABS tablet  Take 1 tablet by mouth daily. Centrum Silver    . nitroGLYCERIN (NITROSTAT) 0.4 MG SL tablet Place 1 tablet (0.4 mg total) under the tongue every 5 (five) minutes as needed for chest pain. (Patient not taking: Reported on 08/02/2019) 25 tablet 6  . OVER THE COUNTER MEDICATION Take 1 tablet by mouth daily as needed. Colace    . pantoprazole (PROTONIX) 40 MG tablet Take 40 mg daily by mouth.    Vladimir Faster Glycol-Propyl Glycol (SYSTANE) 0.4-0.3 % SOLN Place 1-2 drops into both eyes 3 (three) times daily as needed (dry/irritated eyes.).    Marland Kitchen prochlorperazine (COMPAZINE) 10 MG tablet Take 1 tablet (10 mg total) by mouth every 6 (six) hours as needed for nausea or vomiting. (Patient not taking: Reported on 08/02/2019) 30 tablet 2  . ramipril (ALTACE) 10 MG capsule Take 10 mg by mouth 2 (two) times daily.     . traMADol (ULTRAM) 50 MG tablet Take 1 tablet (50 mg total) by mouth every 6 (six) hours as needed. (Patient not taking: Reported on 08/02/2019) 20 tablet 0  . VENTOLIN HFA 108 (90 Base) MCG/ACT inhaler Inhale 1-2 puffs into the lungs every 6 (six) hours as needed for wheezing or shortness of breath.   0  . ZETIA 10 MG tablet Take 10 mg by mouth every evening.      No current facility-administered medications for this visit.    SURGICAL HISTORY:  Past Surgical History:  Procedure Laterality Date  . ABDOMINAL AORTAGRAM Left 09/16/2013   Procedure: ABDOMINAL Maxcine Ham;  Surgeon: Elam Dutch, MD;  Location: [facility] CATH LAB;  Service: Cardiovascular;  Laterality: Left;  . CARDIAC CATHETERIZATION  01/2011   When there was segmental stenosis of the distal RCA, patent PCA stent, patent circumflex stent, and a patent small diagonal with 90% ISR.   Marland Kitchen COLONOSCOPY W/ BIOPSIES AND POLYPECTOMY    . CORONARY ANGIOPLASTY  may 2002   Non-DES stenting of his circumflex, non-DES stenting of the RCA  . HYDROCELE EXCISION / REPAIR  2009   by Dr Janice Norrie  . INGUINAL HERNIA REPAIR Bilateral    Dr Bubba Camp  . LYMPH NODE  BIOPSY Left 01/10/2019   Procedure: left supraclavicular LYMPH NODE BIOPSY;  Surgeon: [name], MD;  Location: [facility];  Service: Thoracic;  Laterality: Left;  Marland Kitchen MULTIPLE TOOTH EXTRACTIONS    . NM MYOCAR PERF WALL MOTION  01/08/2011   moderate in size and intensity area of reversible ischemia in the basal to mid inferior and septal territories. Abnormal study  . stents  2008   proximal RCA, DES for progession of disease.  Marland Kitchen US ECHOCARDIOGRAPHY  01/12/2012   mild concentric LVH, borderline LA enlargement, mild to mod TR    REVIEW OF SYSTEMS:  Constitutional: positive for fatigue Eyes: negative Ears, nose, mouth, throat, and face: negative Respiratory: negative Cardiovascular: negative  Gastrointestinal: negative Genitourinary:negative Integument/breast: negative Hematologic/lymphatic: negative Musculoskeletal:negative Neurological: negative Behavioral/Psych: negative Endocrine: negative Allergic/Immunologic: negative   PHYSICAL EXAMINATION: General appearance: alert, cooperative and no distress Head: Normocephalic, without obvious abnormality, atraumatic Neck: no adenopathy, no JVD, supple, symmetrical, trachea midline and thyroid not enlarged, symmetric, no tenderness/mass/nodules Lymph nodes: Cervical, supraclavicular, and axillary nodes normal. Resp: clear to auscultation bilaterally Back: symmetric, no curvature. ROM normal. No CVA tenderness. Cardio: regular rate and rhythm, S1, S2 normal, no murmur, click, rub or gallop GI: soft, non-tender; bowel sounds normal; no masses,  no organomegaly Extremities: extremities normal, atraumatic, no cyanosis or edema Neurologic: Alert and oriented X 3, normal strength and tone. Normal symmetric reflexes. Normal coordination and gait  ECOG PERFORMANCE STATUS: 1 - Symptomatic but completely ambulatory  Blood pressure 138/79, pulse 86, temperature (!) 97.5 F (36.4 C), temperature source Temporal, resp. rate 18, height _0  (1.753  m), weight 185 lb 4.8 oz (84.1 kg), SpO2 100 %.  LABORATORY DATA: Lab Results  Component Value Date   WBC 4.8 10/26/2019   HGB 10.8 (L) 10/26/2019   HCT 32.5 (L) 10/26/2019   MCV 105.9 (H) 10/26/2019   PLT 290 10/26/2019      Chemistry      Component Value Date/Time   NA 142 10/26/2019 1137   K 4.2 10/26/2019 1137   CL 106 10/26/2019 1137   CO2 22 10/26/2019 1137   BUN 15 10/26/2019 1137   CREATININE 1.23 10/26/2019 1137   CREATININE 0.94 05/16/2014 1109      Component Value Date/Time   CALCIUM 9.9 10/26/2019 1137   ALKPHOS 115 10/26/2019 1137   AST 37 10/26/2019 1137   ALT 38 10/26/2019 1137   BILITOT 0.4 10/26/2019 1137       RADIOGRAPHIC STUDIES: CT Chest W Contrast  Result Date: 10/25/2019 CLINICAL DATA:  Primary Cancer Type: Lung Imaging Indication: Routine surveillance Interval therapy since last imaging? Yes Initial Cancer Diagnosis Date: 01/10/2019; Established by: Biopsy-proven Detailed Pathology: Stage IV non-small cell lung cancer, adenocarcinoma. Primary Tumor location: Left upper lobe. Metastasis to the right adrenal gland. Chemotherapy: Yes; Ongoing? Yes; Most recent administration: 10/05/2019 Immunotherapy?  Yes; Type: Keytruda; ongoing? Yes Radiation therapy? Yes Date Range: 03/28/2019-04/07/2019; Target: Right adrenal gland Date Range: 01/31/2019 - 03/16/2019; Target: Left lung EXAM: CT CHEST, ABDOMEN, AND PELVIS WITH CONTRAST TECHNIQUE: Multidetector CT imaging of the chest, abdomen and pelvis was performed following the standard protocol during bolus administration of intravenous contrast. CONTRAST:  168m OMNIPAQUE IOHEXOL 300 MG/ML  SOLN COMPARISON:  Most recent CT chest, abdomen and pelvis 08/22/2019. 12/06/2018 PET-CT. FINDINGS: CT CHEST FINDINGS Cardiovascular: Normal heart size. No significant pericardial effusion/thickening. Three-vessel coronary atherosclerosis. Atherosclerotic nonaneurysmal thoracic aorta. Top-normal caliber main pulmonary artery (3.2 cm  diameter). No central pulmonary emboli. Mediastinum/Nodes: Stable subcentimeter hypodense isthmus and left thyroid nodules. Not clinically significant; no follow-up imaging recommended (ref: J Am Coll Radiol. 2015 Feb;12(2): 143-50). Unremarkable esophagus. No axillary adenopathy. Top-normal size 0.9 cm inferior left posterior triangle neck node (series 2/image 3), previously 0.9 cm, stable. No pathologically enlarged mediastinal or hilar lymph nodes. Lungs/Pleura: No pneumothorax. No pleural effusion. Moderate centrilobular and paraseptal emphysema with diffuse bronchial wall thickening. No acute consolidative airspace disease or lung masses. Irregular posterior left upper lobe 1.1 x 0.8 cm pulmonary nodule (series 6/image 35), previously 1.1 x 0.9 cm using similar measurement technique, stable. Tiny 0.4 cm peripheral left lower lobe pulmonary nodule (series 6/image 99) is stable. Stable scattered tiny calcified granulomas. No new significant pulmonary nodules.  Musculoskeletal: No aggressive appearing focal osseous lesions. Mild thoracic spondylosis. CT ABDOMEN PELVIS FINDINGS Hepatobiliary: Normal liver size. A few scattered simple liver cysts, largest 2.1 cm in the segment 4B left liver lobe. Several subcentimeter hypodense liver lesions scattered throughout the liver, too small to characterize, not appreciably changed. No appreciable new liver lesions. Normal gallbladder with no radiopaque cholelithiasis. No biliary ductal dilatation. Pancreas: Normal, with no mass or duct dilation. Spleen: Normal size. No mass. Adrenals/Urinary Tract: No discrete adrenal nodules. No hydronephrosis. Simple 2.6 cm posterior interpolar right renal cyst. Normal bladder. Stomach/Bowel: Normal non-distended stomach. Normal caliber small bowel with no small bowel wall thickening. Normal appendix. Oral contrast transits to the rectum. Mild left colonic diverticulosis, with no large bowel wall thickening or significant pericolonic fat  stranding. Vascular/Lymphatic: Atherosclerotic nonaneurysmal abdominal aorta. Patent portal, splenic, hepatic and renal veins. No pathologically enlarged lymph nodes in the abdomen or pelvis. Reproductive: Mild prostatomegaly with coarse nonspecific internal prostatic calcifications. Other: No pneumoperitoneum, ascites or focal fluid collection. Incompletely visualized heterogeneous hyperdense 2.7 x 1.8 cm intramuscular mass in the medial proximal right thigh musculature (series 2/image 126), not well visualized on 06/20/2019 or 08/22/2019 CT studies, although appearing increased from 1.6 x 1.0 cm on 08/22/2019 and 1.0 x 0.6 cm on 06/20/2019. Musculoskeletal: No aggressive appearing focal osseous lesions. Marked lumbar spondylosis. IMPRESSION: 1. Enlarging heterogeneous hyperdense 2.7 cm intramuscular mass in the medial proximal right thigh, suspicious for intramuscular metastasis. PET-CT could be considered for further evaluation. 2. Otherwise no new or progressive metastatic disease. No recurrent right adrenal metastasis. Stable treated left upper lobe pulmonary nodule and inferior posterior triangle left cervical lymph node. 3. Chronic findings include: Aortic Atherosclerosis (ICD10-I70.0) and Emphysema (ICD10-J43.9). Three-vessel coronary atherosclerosis. Mild prostatomegaly. Electronically Signed   By: Ilona Sorrel M.D.   On: 10/25/2019 09:44   CT Abdomen Pelvis W Contrast  Result Date: 10/25/2019 CLINICAL DATA:  Primary Cancer Type: Lung Imaging Indication: Routine surveillance Interval therapy since last imaging? Yes Initial Cancer Diagnosis Date: 01/10/2019; Established by: Biopsy-proven Detailed Pathology: Stage IV non-small cell lung cancer, adenocarcinoma. Primary Tumor location: Left upper lobe. Metastasis to the right adrenal gland. Chemotherapy: Yes; Ongoing? Yes; Most recent administration: 10/05/2019 Immunotherapy?  Yes; Type: Keytruda; ongoing? Yes Radiation therapy? Yes Date Range:  03/28/2019-04/07/2019; Target: Right adrenal gland Date Range: 01/31/2019 - 03/16/2019; Target: Left lung EXAM: CT CHEST, ABDOMEN, AND PELVIS WITH CONTRAST TECHNIQUE: Multidetector CT imaging of the chest, abdomen and pelvis was performed following the standard protocol during bolus administration of intravenous contrast. CONTRAST:  144m OMNIPAQUE IOHEXOL 300 MG/ML  SOLN COMPARISON:  Most recent CT chest, abdomen and pelvis 08/22/2019. 12/06/2018 PET-CT. FINDINGS: CT CHEST FINDINGS Cardiovascular: Normal heart size. No significant pericardial effusion/thickening. Three-vessel coronary atherosclerosis. Atherosclerotic nonaneurysmal thoracic aorta. Top-normal caliber main pulmonary artery (3.2 cm diameter). No central pulmonary emboli. Mediastinum/Nodes: Stable subcentimeter hypodense isthmus and left thyroid nodules. Not clinically significant; no follow-up imaging recommended (ref: J Am Coll Radiol. 2015 Feb;12(2): 143-50). Unremarkable esophagus. No axillary adenopathy. Top-normal size 0.9 cm inferior left posterior triangle neck node (series 2/image 3), previously 0.9 cm, stable. No pathologically enlarged mediastinal or hilar lymph nodes. Lungs/Pleura: No pneumothorax. No pleural effusion. Moderate centrilobular and paraseptal emphysema with diffuse bronchial wall thickening. No acute consolidative airspace disease or lung masses. Irregular posterior left upper lobe 1.1 x 0.8 cm pulmonary nodule (series 6/image 35), previously 1.1 x 0.9 cm using similar measurement technique, stable. Tiny 0.4 cm peripheral left lower lobe pulmonary nodule (series 6/image 99) is  stable. Stable scattered tiny calcified granulomas. No new significant pulmonary nodules. Musculoskeletal: No aggressive appearing focal osseous lesions. Mild thoracic spondylosis. CT ABDOMEN PELVIS FINDINGS Hepatobiliary: Normal liver size. A few scattered simple liver cysts, largest 2.1 cm in the segment 4B left liver lobe. Several subcentimeter hypodense  liver lesions scattered throughout the liver, too small to characterize, not appreciably changed. No appreciable new liver lesions. Normal gallbladder with no radiopaque cholelithiasis. No biliary ductal dilatation. Pancreas: Normal, with no mass or duct dilation. Spleen: Normal size. No mass. Adrenals/Urinary Tract: No discrete adrenal nodules. No hydronephrosis. Simple 2.6 cm posterior interpolar right renal cyst. Normal bladder. Stomach/Bowel: Normal non-distended stomach. Normal caliber small bowel with no small bowel wall thickening. Normal appendix. Oral contrast transits to the rectum. Mild left colonic diverticulosis, with no large bowel wall thickening or significant pericolonic fat stranding. Vascular/Lymphatic: Atherosclerotic nonaneurysmal abdominal aorta. Patent portal, splenic, hepatic and renal veins. No pathologically enlarged lymph nodes in the abdomen or pelvis. Reproductive: Mild prostatomegaly with coarse nonspecific internal prostatic calcifications. Other: No pneumoperitoneum, ascites or focal fluid collection. Incompletely visualized heterogeneous hyperdense 2.7 x 1.8 cm intramuscular mass in the medial proximal right thigh musculature (series 2/image 126), not well visualized on 06/20/2019 or 08/22/2019 CT studies, although appearing increased from 1.6 x 1.0 cm on 08/22/2019 and 1.0 x 0.6 cm on 06/20/2019. Musculoskeletal: No aggressive appearing focal osseous lesions. Marked lumbar spondylosis. IMPRESSION: 1. Enlarging heterogeneous hyperdense 2.7 cm intramuscular mass in the medial proximal right thigh, suspicious for intramuscular metastasis. PET-CT could be considered for further evaluation. 2. Otherwise no new or progressive metastatic disease. No recurrent right adrenal metastasis. Stable treated left upper lobe pulmonary nodule and inferior posterior triangle left cervical lymph node. 3. Chronic findings include: Aortic Atherosclerosis (ICD10-I70.0) and Emphysema (ICD10-J43.9).  Three-vessel coronary atherosclerosis. Mild prostatomegaly. Electronically Signed   By: Ilona Sorrel M.D.   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 The patient completed a course of concurrent chemoradiation to the chest with weekly carboplatin for AUC of 2 and paclitaxel 45 mg/M2 status post 9 cycles..  The patient has been tolerating this treatment well with no concerning adverse effects. He had repeat CT scan of the chest, abdomen pelvis performed recently.  I personally and independently reviewed the scans and discussed the results with the patient today. His scan showed no concerning findings for disease progression in the chest, abdomen and pelvis but there was enlarging heterogeneous hypodense 2.7 cm intramuscular mass in the medial proximal right thigh suspicious for intramuscular metastasis.  The patient is currently asymptomatic in that area. I recommended for him to have MRI of the right femur for evaluation of this muscular lesion. After the MRI suspicious for metastatic disease, I will refer the patient to radiation oncology for consideration of palliative radiotherapy to this area. I recommended for the patient to continue his current maintenance treatment with Alimta and Keytruda and he will proceed with cycle #10 today. For the depression and lack of appetite, he will continue his treatment with Remeron 30 mg p.o. nightly. The patient will come back for follow-up visit in 3 weeks for evaluation before the next cycle of his treatment. He was advised to call immediately if he has any concerning symptoms  in the interval. The patient voices understanding of current disease status and treatment options and  is in agreement with the current care plan. All questions were answered. The patient knows to call the clinic with any problems, questions or concerns. We can certainly see the patient much sooner if necessary.   Disclaimer: This note was dictated with voice recognition software. Similar sounding words can inadvertently be transcribed and may not be corrected upon review.

## 2019-11-16 ENCOUNTER — Inpatient Hospital Stay: Payer: Medicare Other

## 2019-11-16 ENCOUNTER — Inpatient Hospital Stay: Payer: Medicare Other | Admitting: Internal Medicine

## 2019-11-16 ENCOUNTER — Other Ambulatory Visit: Payer: Self-pay

## 2019-11-16 ENCOUNTER — Encounter: Payer: Self-pay | Admitting: Internal Medicine

## 2019-11-16 VITALS — BP 131/85 | HR 66 | Temp 97.7°F | Resp 18 | Ht 69.0 in | Wt 182.2 lb

## 2019-11-16 DIAGNOSIS — C3412 Malignant neoplasm of upper lobe, left bronchus or lung: Secondary | ICD-10-CM | POA: Diagnosis not present

## 2019-11-16 DIAGNOSIS — C7971 Secondary malignant neoplasm of right adrenal gland: Secondary | ICD-10-CM | POA: Diagnosis not present

## 2019-11-16 DIAGNOSIS — Z5111 Encounter for antineoplastic chemotherapy: Secondary | ICD-10-CM | POA: Diagnosis not present

## 2019-11-16 DIAGNOSIS — Z5112 Encounter for antineoplastic immunotherapy: Secondary | ICD-10-CM

## 2019-11-16 LAB — CMP (CANCER CENTER ONLY)
ALT: 41 U/L (ref 0–44)
AST: 38 U/L (ref 15–41)
Albumin: 3.5 g/dL (ref 3.5–5.0)
Alkaline Phosphatase: 116 U/L (ref 38–126)
Anion gap: 10 (ref 5–15)
BUN: 15 mg/dL (ref 8–23)
CO2: 23 mmol/L (ref 22–32)
Calcium: 9.9 mg/dL (ref 8.9–10.3)
Chloride: 107 mmol/L (ref 98–111)
Creatinine: 1.23 mg/dL (ref 0.61–1.24)
GFR, Est AFR Am: >60 mL/min (ref >60)
GFR, Estimated: 57 mL/min — ABNORMAL LOW (ref >60)
Glucose, Bld: 91 mg/dL (ref 70–99)
Potassium: 4.7 mmol/L (ref 3.5–5.1)
Sodium: 140 mmol/L (ref 135–145)
Total Bilirubin: <0.2 mg/dL — ABNORMAL LOW (ref 0.3–1.2)
Total Protein: 7.4 g/dL (ref 6.5–8.1)

## 2019-11-16 LAB — CBC WITH DIFFERENTIAL (CANCER CENTER ONLY)
Abs Immature Granulocytes: 0.01 10*3/uL (ref 0.00–0.07)
Basophils Absolute: 0 10*3/uL (ref 0.0–0.1)
Basophils Relative: 1 %
Eosinophils Absolute: 0.1 10*3/uL (ref 0.0–0.5)
Eosinophils Relative: 2 %
HCT: 33.4 % — ABNORMAL LOW (ref 39.0–52.0)
Hemoglobin: 10.9 g/dL — ABNORMAL LOW (ref 13.0–17.0)
Immature Granulocytes: 0 %
Lymphocytes Relative: 15 %
Lymphs Abs: 0.6 10*3/uL — ABNORMAL LOW (ref 0.7–4.0)
MCH: 35 pg — ABNORMAL HIGH (ref 26.0–34.0)
MCHC: 32.6 g/dL (ref 30.0–36.0)
MCV: 107.4 fL — ABNORMAL HIGH (ref 80.0–100.0)
Monocytes Absolute: 0.7 10*3/uL (ref 0.1–1.0)
Monocytes Relative: 16 %
Neutro Abs: 2.8 10*3/uL (ref 1.7–7.7)
Neutrophils Relative %: 66 %
Platelet Count: 288 10*3/uL (ref 150–400)
RBC: 3.11 MIL/uL — ABNORMAL LOW (ref 4.22–5.81)
RDW: 13.7 % (ref 11.5–15.5)
WBC Count: 4.2 10*3/uL (ref 4.0–10.5)
nRBC: 0 % (ref 0.0–0.2)

## 2019-11-16 LAB — TSH: TSH: 0.698 u[IU]/mL (ref 0.320–4.118)

## 2019-11-16 MED ORDER — SODIUM CHLORIDE 0.9 % IV SOLN
Freq: Once | INTRAVENOUS | Status: AC
  Filled 2019-11-16: qty 250

## 2019-11-16 MED ORDER — PROCHLORPERAZINE MALEATE 10 MG PO TABS
10.0000 mg | ORAL_TABLET | Freq: Once | ORAL | Status: AC
  Administered 2019-11-16: 10 mg via ORAL

## 2019-11-16 MED ORDER — PROCHLORPERAZINE MALEATE 10 MG PO TABS
ORAL_TABLET | ORAL | Status: AC
  Filled 2019-11-16: qty 1

## 2019-11-16 MED ORDER — SODIUM CHLORIDE 0.9 % IV SOLN
500.0000 mg/m2 | Freq: Once | INTRAVENOUS | Status: AC
  Administered 2019-11-16: 1000 mg via INTRAVENOUS
  Filled 2019-11-16: qty 40

## 2019-11-16 MED ORDER — SODIUM CHLORIDE 0.9 % IV SOLN
200.0000 mg | Freq: Once | INTRAVENOUS | Status: AC
  Administered 2019-11-16: 200 mg via INTRAVENOUS
  Filled 2019-11-16: qty 8

## 2019-11-16 NOTE — Patient Instructions (Signed)
[facility] Discharge Instructions for Patients Receiving Chemotherapy  Today you received the following chemotherapy agents: Keytruda, Alimta  To help prevent nausea and vomiting after your treatment, we encourage you to take your nausea medication as directed.    If you develop nausea and vomiting that is not controlled by your nausea medication, call the clinic.   BELOW ARE SYMPTOMS THAT SHOULD BE REPORTED IMMEDIATELY:  *FEVER GREATER THAN 100.5 F  *CHILLS WITH OR WITHOUT FEVER  NAUSEA AND VOMITING THAT IS NOT CONTROLLED WITH YOUR NAUSEA MEDICATION  *UNUSUAL SHORTNESS OF BREATH  *UNUSUAL BRUISING OR BLEEDING  TENDERNESS IN MOUTH AND THROAT WITH OR WITHOUT PRESENCE OF ULCERS  *URINARY PROBLEMS  *BOWEL PROBLEMS  UNUSUAL RASH Items with * indicate a potential emergency and should be followed up as soon as possible.  Feel free to call the clinic should you have any questions or concerns. The clinic phone number is [phone].  Please show the [facility] at check-in to the Emergency Department and triage nurse.

## 2019-11-16 NOTE — Progress Notes (Signed)
[facility] [phone]   [phone]  OFFICE PROGRESS NOTE  [name], MD [address] [place] [address]  DIAGNOSIS:  Stage IV (T1b, N3, M1c) non-small cell lung cancer, adenocarcinoma. 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  Biomarker Findings Tumor Mutational Burden - 10 Muts/Mb Microsatellite status - MS-Stable Genomic Findings For a complete list of the genes assayed, please refer to the Appendix. ERBB2 amplification - equivocal GEX52 W41* FANCC splice site 324+4W>N SMAD4 Q289* TP53 E298* 7 Disease relevant genes with no reportable alterations: ALK, BRAF, EGFR, KRAS, MET, RET, ROS1   PDL1 expression is 30%.  PRIOR THERAPY: 1) Weekly concurrent chemoradiation with carboplatin for an AUC of 2 and paclitaxel 45 mg/m2 to the locally advanced disease in the chest. He started the first dose 01/31/2019. Status post7cycles.Last dose of chemotherapy was given on March 14, 2019. 2) palliative radiotherapy to the metastatic disease in the right adrenal gland.  CURRENT THERAPY: Systemic chemotherapy with carboplatin for AUC of 5, Alimta 500 mg/M2 and Keytruda 200 mg IV every 3 weeks. First dose April 20, 2019. Status post 10 cycles.   Starting from cycle #5, the patient is on maintenance treatment with Alimta and Keytruda every 3 weeks.  INTERVAL HISTORY: Trevor Bailey 74 y.o. male returns to the clinic today for follow-up visit.  The patient is feeling fine today with no concerning complaints.  He continues to tolerate his maintenance treatment fairly well.  He denied having any current chest pain, shortness of breath, cough or hemoptysis.  He denied having any nausea, vomiting, diarrhea or constipation.  He has no headache or visual changes.   He has no recent weight loss or night sweats.  The patient is here today for evaluation before starting cycle #11 of his treatment.  MEDICAL HISTORY: Past Medical History:  Diagnosis Date   Adenopathy    left supraclavicular lymph node   Allergies    COPD (chronic obstructive pulmonary disease) (HCC)    Coronary disease    Diabetes (HCC)    Enlarged prostate    GERD (gastroesophageal reflux disease)    Glaucoma    Hyperlipidemia    Hypertension    Myocardial infarction ([place])    Sleep apnea    wears CPAP   Wears partial dentures    upper and lower    ALLERGIES:  has No Known Allergies.  MEDICATIONS:  Current Outpatient Medications  Medication Sig Dispense Refill   amLODipine (NORVASC) 5 MG tablet TAKE 1 TABLET BY MOUTH  DAILY 90 tablet 1   aspirin EC 81 MG tablet Take 81 mg by mouth daily.     atorvastatin (LIPITOR) 80 MG tablet Take 1 tablet (80 mg total) by mouth daily. 90 tablet 3   augmented betamethasone dipropionate (DIPROLENE-AF) 0.05 % cream      CLARITIN 10 MG tablet Take 10 mg by mouth daily.      clopidogrel (PLAVIX) 75 MG tablet Take 1 tablet (75 mg total) by mouth every evening. 30 tablet 0   finasteride (PROSCAR) 5 MG tablet Take 5 mg by mouth daily.      folic acid (FOLVITE) 1 MG tablet Take 1 tablet (1 mg total) by mouth daily. 90 tablet 1   hydrochlorothiazide (HYDRODIURIL) 25 MG tablet Take 25 mg by mouth daily.  latanoprost (XALATAN) 0.005 % ophthalmic solution Place 1 drop into both eyes at bedtime.      metFORMIN (GLUCOPHAGE) 1000 MG tablet Take 1,000 mg by mouth 2 (two) times daily.      metoprolol tartrate (LOPRESSOR) 25 MG tablet Take 25 mg 2 (two) times daily by mouth.     mirtazapine (REMERON) 30 MG tablet TAKE 1 TABLET BY MOUTH AT  BEDTIME 90 tablet 3   Multiple Vitamin (MULTIVITAMIN WITH MINERALS) TABS tablet Take 1 tablet by mouth daily. Centrum Silver     nitroGLYCERIN (NITROSTAT) 0.4 MG SL tablet Place 1 tablet  (0.4 mg total) under the tongue every 5 (five) minutes as needed for chest pain. (Patient not taking: Reported on 08/02/2019) 25 tablet 6   OVER THE COUNTER MEDICATION Take 1 tablet by mouth daily as needed. Colace     pantoprazole (PROTONIX) 40 MG tablet Take 40 mg daily by mouth.     Polyethyl Glycol-Propyl Glycol (SYSTANE) 0.4-0.3 % SOLN Place 1-2 drops into both eyes 3 (three) times daily as needed (dry/irritated eyes.).     prochlorperazine (COMPAZINE) 10 MG tablet Take 1 tablet (10 mg total) by mouth every 6 (six) hours as needed for nausea or vomiting. (Patient not taking: Reported on 08/02/2019) 30 tablet 2   ramipril (ALTACE) 10 MG capsule Take 10 mg by mouth 2 (two) times daily.      traMADol (ULTRAM) 50 MG tablet Take 1 tablet (50 mg total) by mouth every 6 (six) hours as needed. (Patient not taking: Reported on 08/02/2019) 20 tablet 0   VENTOLIN HFA 108 (90 Base) MCG/ACT inhaler Inhale 1-2 puffs into the lungs every 6 (six) hours as needed for wheezing or shortness of breath.   0   ZETIA 10 MG tablet Take 10 mg by mouth every evening.      No current facility-administered medications for this visit.    SURGICAL HISTORY:  Past Surgical History:  Procedure Laterality Date   ABDOMINAL AORTAGRAM Left 09/16/2013   Procedure: ABDOMINAL AORTAGRAM;  Surgeon: Elam Dutch, MD;  Location: [facility] CATH LAB;  Service: Cardiovascular;  Laterality: Left;   CARDIAC CATHETERIZATION  01/2011   When there was segmental stenosis of the distal RCA, patent PCA stent, patent circumflex stent, and a patent small diagonal with 90% ISR.    COLONOSCOPY W/ BIOPSIES AND POLYPECTOMY     CORONARY ANGIOPLASTY  may 2002   Non-DES stenting of his circumflex, non-DES stenting of the RCA   HYDROCELE EXCISION / REPAIR  2009   by Dr Jackquline Bosch HERNIA REPAIR Bilateral    Dr Bubba Camp   LYMPH NODE BIOPSY Left 01/10/2019   Procedure: left supraclavicular LYMPH NODE BIOPSY;  Surgeon: [name],  MD;  Location: [facility];  Service: Thoracic;  Laterality: Left;   MULTIPLE TOOTH EXTRACTIONS     NM [place]  01/08/2011   moderate in size and intensity area of reversible ischemia in the basal to mid inferior and septal territories. Abnormal study   stents  2008   proximal RCA, DES for progession of disease.   US ECHOCARDIOGRAPHY  01/12/2012   mild concentric LVH, borderline LA enlargement, mild to mod TR    REVIEW OF SYSTEMS:  A comprehensive review of systems was negative.   PHYSICAL EXAMINATION: General appearance: alert, cooperative and no distress Head: Normocephalic, without obvious abnormality, atraumatic Neck: no adenopathy, no JVD, supple, symmetrical, trachea midline and thyroid not enlarged, symmetric, no tenderness/mass/nodules Lymph nodes: Cervical,  supraclavicular, and axillary nodes normal. Resp: clear to auscultation bilaterally Back: symmetric, no curvature. ROM normal. No CVA tenderness. Cardio: regular rate and rhythm, S1, S2 normal, no murmur, click, rub or gallop GI: soft, non-tender; bowel sounds normal; no masses,  no organomegaly Extremities: extremities normal, atraumatic, no cyanosis or edema  ECOG PERFORMANCE STATUS: 1 - Symptomatic but completely ambulatory  Blood pressure 131/85, pulse 66, temperature 97.7 F (36.5 C), temperature source Temporal, resp. rate 18, height '5\' 9"'$  (1.753 m), weight 182 lb 3.2 oz (82.6 kg), SpO2 100 %.  LABORATORY DATA: Lab Results  Component Value Date   WBC 4.2 11/16/2019   HGB 10.9 (L) 11/16/2019   HCT 33.4 (L) 11/16/2019   MCV 107.4 (H) 11/16/2019   PLT 288 11/16/2019      Chemistry      Component Value Date/Time   NA 140 11/16/2019 1151   K 4.7 11/16/2019 1151   CL 107 11/16/2019 1151   CO2 23 11/16/2019 1151   BUN 15 11/16/2019 1151   CREATININE 1.23 11/16/2019 1151   CREATININE 0.94 05/16/2014 1109      Component Value Date/Time   CALCIUM 9.9 11/16/2019 1151   ALKPHOS 116 11/16/2019 1151     AST 38 11/16/2019 1151   ALT 41 11/16/2019 1151   BILITOT <0.2 (L) 11/16/2019 1151       RADIOGRAPHIC STUDIES: CT Chest W Contrast  Result Date: 10/25/2019 CLINICAL DATA:  Primary Cancer Type: Lung Imaging Indication: Routine surveillance Interval therapy since last imaging? Yes Initial Cancer Diagnosis Date: 01/10/2019; Established by: Biopsy-proven Detailed Pathology: Stage IV non-small cell lung cancer, adenocarcinoma. Primary Tumor location: Left upper lobe. Metastasis to the right adrenal gland. Chemotherapy: Yes; Ongoing? Yes; Most recent administration: 10/05/2019 Immunotherapy?  Yes; Type: Keytruda; ongoing? Yes Radiation therapy? Yes Date Range: 03/28/2019-04/07/2019; Target: Right adrenal gland Date Range: 01/31/2019 - 03/16/2019; Target: Left lung EXAM: CT CHEST, ABDOMEN, AND PELVIS WITH CONTRAST TECHNIQUE: Multidetector CT imaging of the chest, abdomen and pelvis was performed following the standard protocol during bolus administration of intravenous contrast. CONTRAST:  134m OMNIPAQUE IOHEXOL 300 MG/ML  SOLN COMPARISON:  Most recent CT chest, abdomen and pelvis 08/22/2019. 12/06/2018 PET-CT. FINDINGS: CT CHEST FINDINGS Cardiovascular: Normal heart size. No significant pericardial effusion/thickening. Three-vessel coronary atherosclerosis. Atherosclerotic nonaneurysmal thoracic aorta. Top-normal caliber main pulmonary artery (3.2 cm diameter). No central pulmonary emboli. Mediastinum/Nodes: Stable subcentimeter hypodense isthmus and left thyroid nodules. Not clinically significant; no follow-up imaging recommended (ref: J Am Coll Radiol. 2015 Feb;12(2): 143-50). Unremarkable esophagus. No axillary adenopathy. Top-normal size 0.9 cm inferior left posterior triangle neck node (series 2/image 3), previously 0.9 cm, stable. No pathologically enlarged mediastinal or hilar lymph nodes. Lungs/Pleura: No pneumothorax. No pleural effusion. Moderate centrilobular and paraseptal emphysema with diffuse  bronchial wall thickening. No acute consolidative airspace disease or lung masses. Irregular posterior left upper lobe 1.1 x 0.8 cm pulmonary nodule (series 6/image 35), previously 1.1 x 0.9 cm using similar measurement technique, stable. Tiny 0.4 cm peripheral left lower lobe pulmonary nodule (series 6/image 99) is stable. Stable scattered tiny calcified granulomas. No new significant pulmonary nodules. Musculoskeletal: No aggressive appearing focal osseous lesions. Mild thoracic spondylosis. CT ABDOMEN PELVIS FINDINGS Hepatobiliary: Normal liver size. A few scattered simple liver cysts, largest 2.1 cm in the segment 4B left liver lobe. Several subcentimeter hypodense liver lesions scattered throughout the liver, too small to characterize, not appreciably changed. No appreciable new liver lesions. Normal gallbladder with no radiopaque cholelithiasis. No biliary ductal dilatation. Pancreas: Normal, with  no mass or duct dilation. Spleen: Normal size. No mass. Adrenals/Urinary Tract: No discrete adrenal nodules. No hydronephrosis. Simple 2.6 cm posterior interpolar right renal cyst. Normal bladder. Stomach/Bowel: Normal non-distended stomach. Normal caliber small bowel with no small bowel wall thickening. Normal appendix. Oral contrast transits to the rectum. Mild left colonic diverticulosis, with no large bowel wall thickening or significant pericolonic fat stranding. Vascular/Lymphatic: Atherosclerotic nonaneurysmal abdominal aorta. Patent portal, splenic, hepatic and renal veins. No pathologically enlarged lymph nodes in the abdomen or pelvis. Reproductive: Mild prostatomegaly with coarse nonspecific internal prostatic calcifications. Other: No pneumoperitoneum, ascites or focal fluid collection. Incompletely visualized heterogeneous hyperdense 2.7 x 1.8 cm intramuscular mass in the medial proximal right thigh musculature (series 2/image 126), not well visualized on 06/20/2019 or 08/22/2019 CT studies, although  appearing increased from 1.6 x 1.0 cm on 08/22/2019 and 1.0 x 0.6 cm on 06/20/2019. Musculoskeletal: No aggressive appearing focal osseous lesions. Marked lumbar spondylosis. IMPRESSION: 1. Enlarging heterogeneous hyperdense 2.7 cm intramuscular mass in the medial proximal right thigh, suspicious for intramuscular metastasis. PET-CT could be considered for further evaluation. 2. Otherwise no new or progressive metastatic disease. No recurrent right adrenal metastasis. Stable treated left upper lobe pulmonary nodule and inferior posterior triangle left cervical lymph node. 3. Chronic findings include: Aortic Atherosclerosis (ICD10-I70.0) and Emphysema (ICD10-J43.9). Three-vessel coronary atherosclerosis. Mild prostatomegaly. Electronically Signed   By: Ilona Sorrel M.D.   On: 10/25/2019 09:44   CT Abdomen Pelvis W Contrast  Result Date: 10/25/2019 CLINICAL DATA:  Primary Cancer Type: Lung Imaging Indication: Routine surveillance Interval therapy since last imaging? Yes Initial Cancer Diagnosis Date: 01/10/2019; Established by: Biopsy-proven Detailed Pathology: Stage IV non-small cell lung cancer, adenocarcinoma. Primary Tumor location: Left upper lobe. Metastasis to the right adrenal gland. Chemotherapy: Yes; Ongoing? Yes; Most recent administration: 10/05/2019 Immunotherapy?  Yes; Type: Keytruda; ongoing? Yes Radiation therapy? Yes Date Range: 03/28/2019-04/07/2019; Target: Right adrenal gland Date Range: 01/31/2019 - 03/16/2019; Target: Left lung EXAM: CT CHEST, ABDOMEN, AND PELVIS WITH CONTRAST TECHNIQUE: Multidetector CT imaging of the chest, abdomen and pelvis was performed following the standard protocol during bolus administration of intravenous contrast. CONTRAST:  161m OMNIPAQUE IOHEXOL 300 MG/ML  SOLN COMPARISON:  Most recent CT chest, abdomen and pelvis 08/22/2019. 12/06/2018 PET-CT. FINDINGS: CT CHEST FINDINGS Cardiovascular: Normal heart size. No significant pericardial effusion/thickening.  Three-vessel coronary atherosclerosis. Atherosclerotic nonaneurysmal thoracic aorta. Top-normal caliber main pulmonary artery (3.2 cm diameter). No central pulmonary emboli. Mediastinum/Nodes: Stable subcentimeter hypodense isthmus and left thyroid nodules. Not clinically significant; no follow-up imaging recommended (ref: J Am Coll Radiol. 2015 Feb;12(2): 143-50). Unremarkable esophagus. No axillary adenopathy. Top-normal size 0.9 cm inferior left posterior triangle neck node (series 2/image 3), previously 0.9 cm, stable. No pathologically enlarged mediastinal or hilar lymph nodes. Lungs/Pleura: No pneumothorax. No pleural effusion. Moderate centrilobular and paraseptal emphysema with diffuse bronchial wall thickening. No acute consolidative airspace disease or lung masses. Irregular posterior left upper lobe 1.1 x 0.8 cm pulmonary nodule (series 6/image 35), previously 1.1 x 0.9 cm using similar measurement technique, stable. Tiny 0.4 cm peripheral left lower lobe pulmonary nodule (series 6/image 99) is stable. Stable scattered tiny calcified granulomas. No new significant pulmonary nodules. Musculoskeletal: No aggressive appearing focal osseous lesions. Mild thoracic spondylosis. CT ABDOMEN PELVIS FINDINGS Hepatobiliary: Normal liver size. A few scattered simple liver cysts, largest 2.1 cm in the segment 4B left liver lobe. Several subcentimeter hypodense liver lesions scattered throughout the liver, too small to characterize, not appreciably changed. No appreciable new liver lesions. Normal gallbladder  with no radiopaque cholelithiasis. No biliary ductal dilatation. Pancreas: Normal, with no mass or duct dilation. Spleen: Normal size. No mass. Adrenals/Urinary Tract: No discrete adrenal nodules. No hydronephrosis. Simple 2.6 cm posterior interpolar right renal cyst. Normal bladder. Stomach/Bowel: Normal non-distended stomach. Normal caliber small bowel with no small bowel wall thickening. Normal appendix. Oral  contrast transits to the rectum. Mild left colonic diverticulosis, with no large bowel wall thickening or significant pericolonic fat stranding. Vascular/Lymphatic: Atherosclerotic nonaneurysmal abdominal aorta. Patent portal, splenic, hepatic and renal veins. No pathologically enlarged lymph nodes in the abdomen or pelvis. Reproductive: Mild prostatomegaly with coarse nonspecific internal prostatic calcifications. Other: No pneumoperitoneum, ascites or focal fluid collection. Incompletely visualized heterogeneous hyperdense 2.7 x 1.8 cm intramuscular mass in the medial proximal right thigh musculature (series 2/image 126), not well visualized on 06/20/2019 or 08/22/2019 CT studies, although appearing increased from 1.6 x 1.0 cm on 08/22/2019 and 1.0 x 0.6 cm on 06/20/2019. Musculoskeletal: No aggressive appearing focal osseous lesions. Marked lumbar spondylosis. IMPRESSION: 1. Enlarging heterogeneous hyperdense 2.7 cm intramuscular mass in the medial proximal right thigh, suspicious for intramuscular metastasis. PET-CT could be considered for further evaluation. 2. Otherwise no new or progressive metastatic disease. No recurrent right adrenal metastasis. Stable treated left upper lobe pulmonary nodule and inferior posterior triangle left cervical lymph node. 3. Chronic findings include: Aortic Atherosclerosis (ICD10-I70.0) and Emphysema (ICD10-J43.9). Three-vessel coronary atherosclerosis. Mild prostatomegaly. Electronically Signed   By: Ilona Sorrel M.D.   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 The patient completed a course of concurrent chemoradiation to the chest with weekly carboplatin for AUC of 2 and paclitaxel 45  mg/M2 status post 10 cycles..  The patient continues to tolerate his treatment well with no concerning adverse effects. I recommended for him to proceed with cycle #11 today as planned. I will see the patient back for follow-up visit in 3 weeks for evaluation before the next cycle of his treatment. For the depression and lack of appetite, he will continue his treatment with Remeron 30 mg p.o. nightly. He was advised to call immediately if he has any concerning symptoms in the interval. The patient voices understanding of current disease status and treatment options and is in agreement with the current care plan. All questions were answered. The patient knows to call the clinic with any problems, questions or concerns. We can certainly see the patient much sooner if necessary.   Disclaimer: This note was dictated with voice recognition software. Similar sounding words can inadvertently be transcribed and may not be corrected upon review.

## 2019-11-18 ENCOUNTER — Telehealth: Payer: Self-pay | Admitting: Internal Medicine

## 2019-11-18 NOTE — Telephone Encounter (Signed)
Scheduled per los. Called and left msg. Mailed printout  °

## 2019-12-06 NOTE — Progress Notes (Signed)
[facility] OFFICE PROGRESS NOTE  [name], MD [place] [address]  [place] [address]  DIAGNOSIS: Stage IV (T1b, N3,M1c) non-small cell lung cancer, adenocarcinoma. 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  Biomarker Findings Tumor Mutational Burden - 10 Muts/Mb Microsatellite status - MS-Stable Genomic Findings For a complete list of the genes assayed, please refer to the Appendix. ERBB2 amplification - equivocal? OVZ85 Y85* FANCC splice site [id number] SMAD4 Q289* TP53 E298* 7 Disease relevant genes with no reportable alterations: ALK, BRAF, EGFR, KRAS, MET, RET, ROS1   PDL1 expression is 30%.  PRIOR THERAPY: 1) Weekly concurrent chemoradiation with carboplatin for an AUC of 2 and paclitaxel 45 mg/m2 to the locally advanced disease in the chest. He started the first dose 01/31/2019. Status post7cycles.Last dose of chemotherapy was given on March 14, 2019. 2) palliative radiotherapy to the metastatic disease in the right adrenal gland.  CURRENT THERAPY: Systemic chemotherapy with carboplatin for AUC of 5, Alimta 500 mg/M2 and Keytruda 200 mg IV every 3 weeks. First dose April 20, 2019. Status post 11 cycles.  Starting from cycle #5, the patient began maintenance Alimta and Keytruda.   INTERVAL HISTORY: Trevor Bailey 74 y.o. male returns to the clinic for a follow up visit. The patient is feeling well today without any concerning complaints. The patient continues to tolerate treatment with alimta and [place] well without any adverse side effects. Denies any fever, chills, night sweats, or weight loss. Denies any chest pain, shortness of breath, cough, or hemoptysis. Denies any nausea, vomiting, diarrhea, or constipation. Denies any headache or visual changes. Denies any  rashes or skin changes. The patient is here today for evaluation prior to starting cycle # 12   MEDICAL HISTORY: Past Medical History:  Diagnosis Date  . Adenopathy    left supraclavicular lymph node  . Allergies   . COPD (chronic obstructive pulmonary disease) ([place])   . Coronary disease   . Diabetes ([place])   . Enlarged prostate   . GERD (gastroesophageal reflux disease)   . Glaucoma   . Hyperlipidemia   . Hypertension   . Myocardial infarction ([place])   . Sleep apnea    wears CPAP  . Wears partial dentures    upper and lower    ALLERGIES:  has No Known Allergies.  MEDICATIONS:  Current Outpatient Medications  Medication Sig Dispense Refill  . ACCU-CHEK GUIDE test strip     . amLODipine (NORVASC) 5 MG tablet TAKE 1 TABLET BY MOUTH  DAILY 90 tablet 1  . aspirin EC 81 MG tablet Take 81 mg by mouth daily.    Marland Kitchen atorvastatin (LIPITOR) 80 MG tablet Take 1 tablet (80 mg total) by mouth daily. 90 tablet 3  . augmented betamethasone dipropionate (DIPROLENE-AF) 0.05 % cream     . CLARITIN 10 MG tablet Take 10 mg by mouth daily.     . clopidogrel (PLAVIX) 75 MG tablet Take 1 tablet (75 mg total) by mouth every evening. 30 tablet 0  . finasteride (PROSCAR) 5 MG tablet Take 5 mg by mouth daily.     . folic acid (FOLVITE) 1 MG tablet Take 1 tablet (1 mg total) by mouth daily. 90 tablet 1  . hydrochlorothiazide (HYDRODIURIL) 25 MG tablet Take 25 mg by mouth daily.     Marland Kitchen latanoprost (XALATAN) 0.005 % ophthalmic solution  Place 1 drop into both eyes at bedtime.     . metFORMIN (GLUCOPHAGE) 1000 MG tablet Take 1,000 mg by mouth 2 (two) times daily.     . metoprolol tartrate (LOPRESSOR) 25 MG tablet Take 25 mg 2 (two) times daily by mouth.    . mirtazapine (REMERON) 30 MG tablet TAKE 1 TABLET BY MOUTH AT  BEDTIME 90 tablet 3  . Multiple Vitamin (MULTIVITAMIN WITH MINERALS) TABS tablet Take 1 tablet by mouth daily. Centrum Silver    . ofloxacin (FLOXIN) 0.3 % OTIC solution     . OVER THE COUNTER  MEDICATION Take 1 tablet by mouth daily as needed. Colace    . pantoprazole (PROTONIX) 40 MG tablet Take 40 mg daily by mouth.    Vladimir Faster Glycol-Propyl Glycol (SYSTANE) 0.4-0.3 % SOLN Place 1-2 drops into both eyes 3 (three) times daily as needed (dry/irritated eyes.).    Marland Kitchen ramipril (ALTACE) 10 MG capsule Take 10 mg by mouth 2 (two) times daily.     Marland Kitchen ZETIA 10 MG tablet Take 10 mg by mouth every evening.     . nitroGLYCERIN (NITROSTAT) 0.4 MG SL tablet Place 1 tablet (0.4 mg total) under the tongue every 5 (five) minutes as needed for chest pain. (Patient not taking: Reported on 08/02/2019) 25 tablet 6  . prochlorperazine (COMPAZINE) 10 MG tablet Take 1 tablet (10 mg total) by mouth every 6 (six) hours as needed for nausea or vomiting. (Patient not taking: Reported on 08/02/2019) 30 tablet 2  . traMADol (ULTRAM) 50 MG tablet Take 1 tablet (50 mg total) by mouth every 6 (six) hours as needed. (Patient not taking: Reported on 08/02/2019) 20 tablet 0  . VENTOLIN HFA 108 (90 Base) MCG/ACT inhaler Inhale 1-2 puffs into the lungs every 6 (six) hours as needed for wheezing or shortness of breath.  (Patient not taking: Reported on 12/07/2019)  0   No current facility-administered medications for this visit.    SURGICAL HISTORY:  Past Surgical History:  Procedure Laterality Date  . ABDOMINAL AORTAGRAM Left 09/16/2013   Procedure: ABDOMINAL Maxcine Ham;  Surgeon: Elam Dutch, MD;  Location: [facility] CATH LAB;  Service: Cardiovascular;  Laterality: Left;  . CARDIAC CATHETERIZATION  01/2011   When there was segmental stenosis of the distal RCA, patent PCA stent, patent circumflex stent, and a patent small diagonal with 90% ISR.   Marland Kitchen COLONOSCOPY W/ BIOPSIES AND POLYPECTOMY    . CORONARY ANGIOPLASTY  may 2002   Non-DES stenting of his circumflex, non-DES stenting of the RCA  . HYDROCELE EXCISION / REPAIR  2009   by Dr Janice Norrie  . INGUINAL HERNIA REPAIR Bilateral    Dr Bubba Camp  . LYMPH NODE BIOPSY Left 01/10/2019    Procedure: left supraclavicular LYMPH NODE BIOPSY;  Surgeon: [name], MD;  Location: [facility];  Service: Thoracic;  Laterality: Left;  Marland Kitchen MULTIPLE TOOTH EXTRACTIONS    . NM MYOCAR PERF WALL MOTION  01/08/2011   moderate in size and intensity area of reversible ischemia in the basal to mid inferior and septal territories. Abnormal study  . stents  2008   proximal RCA, DES for progession of disease.  Marland Kitchen US ECHOCARDIOGRAPHY  01/12/2012   mild concentric LVH, borderline LA enlargement, mild to mod TR    REVIEW OF SYSTEMS:   Review of Systems  Constitutional: Negative for appetite change, chills, fatigue, fever and unexpected weight change.  HENT:   Negative for mouth sores, nosebleeds, sore throat and trouble swallowing.  Eyes: Negative for eye problems and icterus.  Respiratory: Negative for cough, hemoptysis, shortness of breath and wheezing.   Cardiovascular: Negative for chest pain and leg swelling.  Gastrointestinal: Negative for abdominal pain, constipation, diarrhea, nausea and vomiting.  Genitourinary: Negative for bladder incontinence, difficulty urinating, dysuria, frequency and hematuria.   Musculoskeletal: Negative for back pain, gait problem, neck pain and neck stiffness.  Skin: Negative for itching and rash.  Neurological: Negative for dizziness, extremity weakness, gait problem, headaches, light-headedness and seizures.  Hematological: Negative for adenopathy. Does not bruise/bleed easily.  Psychiatric/Behavioral: Negative for confusion, depression and sleep disturbance. The patient is not nervous/anxious.     PHYSICAL EXAMINATION:  Blood pressure 108/67, pulse 78, temperature (!) 97.5 F (36.4 C), temperature source Temporal, resp. rate 18, height '5\' 9"'$  (1.753 m), weight 183 lb 6.4 oz (83.2 kg), SpO2 100 %.  ECOG PERFORMANCE STATUS: 1 - Symptomatic but completely ambulatory  Physical Exam  Constitutional: Oriented to person, place, and time and well-developed,  well-nourished, and in no distress.  HENT:  Head: Normocephalic and atraumatic.  Mouth/Throat: Oropharynx is clear and moist. No oropharyngeal exudate.  Eyes: Conjunctivae are normal. Right eye exhibits no discharge. Left eye exhibits no discharge. No scleral icterus.  Neck: Normal range of motion. Neck supple.  Cardiovascular: Normal rate, regular rhythm, normal heart sounds and intact distal pulses.   Pulmonary/Chest: Effort normal and breath sounds normal. No respiratory distress. No wheezes. No rales.  Abdominal: Soft. Bowel sounds are normal. Exhibits no distension and no mass. There is no tenderness.  Musculoskeletal: Normal range of motion. Exhibits no edema.  Lymphadenopathy:    No cervical adenopathy.  Neurological: Alert and oriented to person, place, and time. Exhibits normal muscle tone. Gait normal. Coordination normal.  Skin: Skin is warm and dry. No rash noted. Not diaphoretic. No erythema. No pallor.  Psychiatric: Mood, memory and judgment normal.  Vitals reviewed.  LABORATORY DATA: Lab Results  Component Value Date   WBC 4.0 12/07/2019   HGB 10.5 (L) 12/07/2019   HCT 31.4 (L) 12/07/2019   MCV 105.0 (H) 12/07/2019   PLT 294 12/07/2019      Chemistry      Component Value Date/Time   NA 140 11/16/2019 1151   K 4.7 11/16/2019 1151   CL 107 11/16/2019 1151   CO2 23 11/16/2019 1151   BUN 15 11/16/2019 1151   CREATININE 1.23 11/16/2019 1151   CREATININE 0.94 05/16/2014 1109      Component Value Date/Time   CALCIUM 9.9 11/16/2019 1151   ALKPHOS 116 11/16/2019 1151   AST 38 11/16/2019 1151   ALT 41 11/16/2019 1151   BILITOT <0.2 (L) 11/16/2019 1151       RADIOGRAPHIC STUDIES:  No results found.   ASSESSMENT/PLAN:  This is a very pleasant 74 year old African-American male with stage IV non-small cell lung cancer, adenocarcinoma with no actionable mutation and PD-L1 expression of 30%,presented with locally advanced disease in the chest as well as solitary  metastasis to the right adrenal gland. He has no actionable mutations.   The patientcompleted a course of concurrent chemoradiation to the chest with weekly carboplatin for AUC of 2 and paclitaxel 45 mg/M2 status post7cycles.Last dose of chemotherapy was given on March 14, 2019. He also completed palliative radiotherapy to the right adrenal gland lesion under the care of Dr. [name].  He is currently undergoing systemic chemotherapy with carboplatin for an AUC of 5, Alimta 500 mg per metered squared and Keytruda 200 mg IV  every 3 weeks.  He is status post 11 cycles.  He tolerated treatment well without any concerning adverse side effects except for mild fatigue. Starting from cycle #5, he has been on maintenance treatment with Alimta and Keytruda.  Labs were reviewed. Recommend that he proceed with cycle #12 today as scheduled. .  I will arrange for a restaging CT scan of the chest prior to starting his next cycle of treatment.   We will see him back for a follow up visit in 3 weeks for evaluation before starting cycle #13.  The patient was advised to call immediately if he has any concerning symptoms in the interval. The patient voices understanding of current disease status and treatment options and is in agreement with the current care plan. All questions were answered. The patient knows to call the clinic with any problems, questions or concerns. We can certainly see the patient much sooner if necessary     Orders Placed This Encounter  Procedures  . CT Abdomen Pelvis W Contrast    Standing Status:   Future    Standing Expiration Date:   12/06/2020    Order Specific Question:   If indicated for the ordered procedure, I authorize the administration of contrast media per Radiology protocol    Answer:   Yes    Order Specific Question:   Preferred imaging location?    Answer:   [facility]    Order Specific Question:   Is Oral Contrast requested for this exam?     Answer:   Yes, Per Radiology protocol    Order Specific Question:   Radiology Contrast Protocol - do NOT remove file path    Answer:   \\[email or web address]  . CT Chest W Contrast    Standing Status:   Future    Standing Expiration Date:   12/06/2020    Order Specific Question:   If indicated for the ordered procedure, I authorize the administration of contrast media per Radiology protocol    Answer:   Yes    Order Specific Question:   Preferred imaging location?    Answer:   [facility]    Order Specific Question:   Radiology Contrast Protocol - do NOT remove file path    Answer:   \\[email or web address]  . TSH    Standing Status:   Standing    Number of Occurrences:   18    Standing Expiration Date:   12/06/2020     Velecia Ovitt L Miryam Mcelhinney, PA-C 12/07/19

## 2019-12-07 ENCOUNTER — Inpatient Hospital Stay: Payer: Medicare Other

## 2019-12-07 ENCOUNTER — Other Ambulatory Visit: Payer: Self-pay

## 2019-12-07 ENCOUNTER — Inpatient Hospital Stay: Payer: Medicare Other | Attending: Internal Medicine | Admitting: Physician Assistant

## 2019-12-07 VITALS — BP 108/67 | HR 78 | Temp 97.5°F | Resp 18 | Ht 69.0 in | Wt 183.4 lb

## 2019-12-07 VITALS — Temp 98.8°F

## 2019-12-07 DIAGNOSIS — Z79899 Other long term (current) drug therapy: Secondary | ICD-10-CM | POA: Insufficient documentation

## 2019-12-07 DIAGNOSIS — C3412 Malignant neoplasm of upper lobe, left bronchus or lung: Secondary | ICD-10-CM | POA: Insufficient documentation

## 2019-12-07 DIAGNOSIS — Z5111 Encounter for antineoplastic chemotherapy: Secondary | ICD-10-CM | POA: Insufficient documentation

## 2019-12-07 DIAGNOSIS — C7971 Secondary malignant neoplasm of right adrenal gland: Secondary | ICD-10-CM | POA: Diagnosis not present

## 2019-12-07 DIAGNOSIS — Z5112 Encounter for antineoplastic immunotherapy: Secondary | ICD-10-CM | POA: Diagnosis not present

## 2019-12-07 LAB — CBC WITH DIFFERENTIAL (CANCER CENTER ONLY)
Abs Immature Granulocytes: 0.01 10*3/uL (ref 0.00–0.07)
Basophils Absolute: 0 10*3/uL (ref 0.0–0.1)
Basophils Relative: 1 %
Eosinophils Absolute: 0.1 10*3/uL (ref 0.0–0.5)
Eosinophils Relative: 2 %
HCT: 31.4 % — ABNORMAL LOW (ref 39.0–52.0)
Hemoglobin: 10.5 g/dL — ABNORMAL LOW (ref 13.0–17.0)
Immature Granulocytes: 0 %
Lymphocytes Relative: 21 %
Lymphs Abs: 0.9 10*3/uL (ref 0.7–4.0)
MCH: 35.1 pg — ABNORMAL HIGH (ref 26.0–34.0)
MCHC: 33.4 g/dL (ref 30.0–36.0)
MCV: 105 fL — ABNORMAL HIGH (ref 80.0–100.0)
Monocytes Absolute: 0.7 10*3/uL (ref 0.1–1.0)
Monocytes Relative: 18 %
Neutro Abs: 2.4 10*3/uL (ref 1.7–7.7)
Neutrophils Relative %: 58 %
Platelet Count: 294 10*3/uL (ref 150–400)
RBC: 2.99 MIL/uL — ABNORMAL LOW (ref 4.22–5.81)
RDW: 14 % (ref 11.5–15.5)
WBC Count: 4 10*3/uL (ref 4.0–10.5)
nRBC: 0 % (ref 0.0–0.2)

## 2019-12-07 LAB — CMP (CANCER CENTER ONLY)
ALT: 54 U/L — ABNORMAL HIGH (ref 0–44)
AST: 41 U/L (ref 15–41)
Albumin: 3.3 g/dL — ABNORMAL LOW (ref 3.5–5.0)
Alkaline Phosphatase: 120 U/L (ref 38–126)
Anion gap: 13 (ref 5–15)
BUN: 15 mg/dL (ref 8–23)
CO2: 23 mmol/L (ref 22–32)
Calcium: 10.5 mg/dL — ABNORMAL HIGH (ref 8.9–10.3)
Chloride: 104 mmol/L (ref 98–111)
Creatinine: 1.38 mg/dL — ABNORMAL HIGH (ref 0.61–1.24)
GFR, Est AFR Am: 58 mL/min — ABNORMAL LOW (ref >60)
GFR, Estimated: 50 mL/min — ABNORMAL LOW (ref >60)
Glucose, Bld: 141 mg/dL — ABNORMAL HIGH (ref 70–99)
Potassium: 4.3 mmol/L (ref 3.5–5.1)
Sodium: 140 mmol/L (ref 135–145)
Total Bilirubin: 0.3 mg/dL (ref 0.3–1.2)
Total Protein: 7.6 g/dL (ref 6.5–8.1)

## 2019-12-07 LAB — TSH: TSH: 1.652 u[IU]/mL (ref 0.320–4.118)

## 2019-12-07 MED ORDER — PROCHLORPERAZINE MALEATE 10 MG PO TABS
ORAL_TABLET | ORAL | Status: AC
Start: 2019-12-07 — End: ?
  Filled 2019-12-07: qty 1

## 2019-12-07 MED ORDER — SODIUM CHLORIDE 0.9 % IV SOLN
Freq: Once | INTRAVENOUS | Status: AC
  Filled 2019-12-07: qty 250

## 2019-12-07 MED ORDER — SODIUM CHLORIDE 0.9 % IV SOLN
500.0000 mg/m2 | Freq: Once | INTRAVENOUS | Status: AC
  Administered 2019-12-07: 1000 mg via INTRAVENOUS
  Filled 2019-12-07: qty 40

## 2019-12-07 MED ORDER — CYANOCOBALAMIN 1000 MCG/ML IJ SOLN
1000.0000 ug | Freq: Once | INTRAMUSCULAR | Status: AC
  Administered 2019-12-07: 1000 ug via INTRAMUSCULAR

## 2019-12-07 MED ORDER — CYANOCOBALAMIN 1000 MCG/ML IJ SOLN
INTRAMUSCULAR | Status: AC
  Filled 2019-12-07: qty 1

## 2019-12-07 MED ORDER — SODIUM CHLORIDE 0.9 % IV SOLN
200.0000 mg | Freq: Once | INTRAVENOUS | Status: AC
  Administered 2019-12-07: 200 mg via INTRAVENOUS
  Filled 2019-12-07: qty 8

## 2019-12-07 MED ORDER — PROCHLORPERAZINE MALEATE 10 MG PO TABS
10.0000 mg | ORAL_TABLET | Freq: Once | ORAL | Status: AC
  Administered 2019-12-07: 10 mg via ORAL

## 2019-12-07 NOTE — Patient Instructions (Signed)
[facility] Discharge Instructions for Patients Receiving Chemotherapy  Today you received the following chemotherapy agents Pembrolizumab (KEYTRUDA) & Pemetrexed (ALIMTA).  To help prevent nausea and vomiting after your treatment, we encourage you to take your nausea medication as prescribed.   If you develop nausea and vomiting that is not controlled by your nausea medication, call the clinic.   BELOW ARE SYMPTOMS THAT SHOULD BE REPORTED IMMEDIATELY:  *FEVER GREATER THAN 100.5 F  *CHILLS WITH OR WITHOUT FEVER  NAUSEA AND VOMITING THAT IS NOT CONTROLLED WITH YOUR NAUSEA MEDICATION  *UNUSUAL SHORTNESS OF BREATH  *UNUSUAL BRUISING OR BLEEDING  TENDERNESS IN MOUTH AND THROAT WITH OR WITHOUT PRESENCE OF ULCERS  *URINARY PROBLEMS  *BOWEL PROBLEMS  UNUSUAL RASH Items with * indicate a potential emergency and should be followed up as soon as possible.  Feel free to call the clinic should you have any questions or concerns. The clinic phone number is [phone].  Please show the [place] at check-in to the Emergency Department and triage nurse.

## 2019-12-09 ENCOUNTER — Other Ambulatory Visit: Payer: Self-pay | Admitting: Cardiovascular Disease

## 2019-12-20 ENCOUNTER — Other Ambulatory Visit: Payer: Self-pay | Admitting: Internal Medicine

## 2019-12-26 ENCOUNTER — Telehealth: Payer: Self-pay | Admitting: Emergency Medicine

## 2019-12-26 ENCOUNTER — Telehealth: Payer: Self-pay | Admitting: Physician Assistant

## 2019-12-26 ENCOUNTER — Other Ambulatory Visit: Payer: Self-pay | Admitting: Physician Assistant

## 2019-12-26 ENCOUNTER — Ambulatory Visit (HOSPITAL_COMMUNITY)
Admission: RE | Admit: 2019-12-26 | Discharge: 2019-12-26 | Disposition: A | Discharge status: Home / Self Care | Payer: Medicare Other | Source: Ambulatory Visit | Attending: Physician Assistant | Admitting: Physician Assistant

## 2019-12-26 ENCOUNTER — Encounter (HOSPITAL_COMMUNITY): Payer: Self-pay

## 2019-12-26 ENCOUNTER — Other Ambulatory Visit: Payer: Self-pay

## 2019-12-26 DIAGNOSIS — C3412 Malignant neoplasm of upper lobe, left bronchus or lung: Secondary | ICD-10-CM | POA: Insufficient documentation

## 2019-12-26 HISTORY — DX: Malignant (primary) neoplasm, unspecified: C80.1

## 2019-12-26 MED ORDER — SODIUM CHLORIDE (PF) 0.9 % IJ SOLN
INTRAMUSCULAR | Status: AC
  Filled 2019-12-26: qty 50

## 2019-12-26 MED ORDER — IOHEXOL 300 MG/ML  SOLN
100.0000 mL | Freq: Once | INTRAMUSCULAR | Status: AC | PRN
  Administered 2019-12-26: 100 mL via INTRAVENOUS

## 2019-12-26 NOTE — Telephone Encounter (Signed)
Received call on triage line from [place] with [facility] Radiology for read report on CT Chest/Abdomen/Pelvis with contrast for today.  Impression #1 highlighted per radiologist for Abdomen/Pelvis.  PA Cassie's office made aware.

## 2019-12-26 NOTE — Telephone Encounter (Signed)
Called the patient about his scan. 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 I placed a referral to radiation oncology. I called the patient to let him know that I have referred him to radiation for this spot. He expressed understanding. He has an MRI scheduled already for this but I will cancel it for him.

## 2019-12-28 ENCOUNTER — Inpatient Hospital Stay: Payer: Medicare Other

## 2019-12-28 ENCOUNTER — Inpatient Hospital Stay: Payer: Medicare Other | Attending: Internal Medicine | Admitting: Internal Medicine

## 2019-12-28 ENCOUNTER — Other Ambulatory Visit: Payer: Self-pay

## 2019-12-28 ENCOUNTER — Encounter: Payer: Self-pay | Admitting: Internal Medicine

## 2019-12-28 VITALS — BP 126/86 | HR 81 | Temp 96.3°F | Resp 18 | Wt 181.0 lb

## 2019-12-28 DIAGNOSIS — C3412 Malignant neoplasm of upper lobe, left bronchus or lung: Secondary | ICD-10-CM

## 2019-12-28 DIAGNOSIS — Z5111 Encounter for antineoplastic chemotherapy: Secondary | ICD-10-CM | POA: Diagnosis not present

## 2019-12-28 DIAGNOSIS — Z79899 Other long term (current) drug therapy: Secondary | ICD-10-CM | POA: Insufficient documentation

## 2019-12-28 DIAGNOSIS — C770 Secondary and unspecified malignant neoplasm of lymph nodes of head, face and neck: Secondary | ICD-10-CM | POA: Insufficient documentation

## 2019-12-28 DIAGNOSIS — I10 Essential (primary) hypertension: Secondary | ICD-10-CM

## 2019-12-28 DIAGNOSIS — Z5112 Encounter for antineoplastic immunotherapy: Secondary | ICD-10-CM | POA: Diagnosis not present

## 2019-12-28 DIAGNOSIS — C7971 Secondary malignant neoplasm of right adrenal gland: Secondary | ICD-10-CM | POA: Diagnosis not present

## 2019-12-28 LAB — CMP (CANCER CENTER ONLY)
ALT: 42 U/L (ref 0–44)
AST: 38 U/L (ref 15–41)
Albumin: 3.3 g/dL — ABNORMAL LOW (ref 3.5–5.0)
Alkaline Phosphatase: 129 U/L — ABNORMAL HIGH (ref 38–126)
Anion gap: 10 (ref 5–15)
BUN: 25 mg/dL — ABNORMAL HIGH (ref 8–23)
CO2: 25 mmol/L (ref 22–32)
Calcium: 10.4 mg/dL — ABNORMAL HIGH (ref 8.9–10.3)
Chloride: 104 mmol/L (ref 98–111)
Creatinine: 1.34 mg/dL — ABNORMAL HIGH (ref 0.61–1.24)
GFR, Est AFR Am: >60 mL/min (ref >60)
GFR, Estimated: 52 mL/min — ABNORMAL LOW (ref >60)
Glucose, Bld: 146 mg/dL — ABNORMAL HIGH (ref 70–99)
Potassium: 4.2 mmol/L (ref 3.5–5.1)
Sodium: 139 mmol/L (ref 135–145)
Total Bilirubin: 0.3 mg/dL (ref 0.3–1.2)
Total Protein: 7.7 g/dL (ref 6.5–8.1)

## 2019-12-28 LAB — CBC WITH DIFFERENTIAL (CANCER CENTER ONLY)
Abs Immature Granulocytes: 0.02 10*3/uL (ref 0.00–0.07)
Basophils Absolute: 0 10*3/uL (ref 0.0–0.1)
Basophils Relative: 1 %
Eosinophils Absolute: 0.1 10*3/uL (ref 0.0–0.5)
Eosinophils Relative: 3 %
HCT: 31.5 % — ABNORMAL LOW (ref 39.0–52.0)
Hemoglobin: 10.5 g/dL — ABNORMAL LOW (ref 13.0–17.0)
Immature Granulocytes: 0 %
Lymphocytes Relative: 15 %
Lymphs Abs: 0.7 10*3/uL (ref 0.7–4.0)
MCH: 34.9 pg — ABNORMAL HIGH (ref 26.0–34.0)
MCHC: 33.3 g/dL (ref 30.0–36.0)
MCV: 104.7 fL — ABNORMAL HIGH (ref 80.0–100.0)
Monocytes Absolute: 0.7 10*3/uL (ref 0.1–1.0)
Monocytes Relative: 17 %
Neutro Abs: 2.9 10*3/uL (ref 1.7–7.7)
Neutrophils Relative %: 64 %
Platelet Count: 299 10*3/uL (ref 150–400)
RBC: 3.01 MIL/uL — ABNORMAL LOW (ref 4.22–5.81)
RDW: 13.8 % (ref 11.5–15.5)
WBC Count: 4.5 10*3/uL (ref 4.0–10.5)
nRBC: 0 % (ref 0.0–0.2)

## 2019-12-28 LAB — TSH: TSH: 1.142 u[IU]/mL (ref 0.320–4.118)

## 2019-12-28 MED ORDER — PROCHLORPERAZINE MALEATE 10 MG PO TABS
ORAL_TABLET | ORAL | Status: AC
  Filled 2019-12-28: qty 1

## 2019-12-28 MED ORDER — SODIUM CHLORIDE 0.9 % IV SOLN
Freq: Once | INTRAVENOUS | Status: AC
  Filled 2019-12-28: qty 250

## 2019-12-28 MED ORDER — SODIUM CHLORIDE 0.9 % IV SOLN
500.0000 mg/m2 | Freq: Once | INTRAVENOUS | Status: AC
  Administered 2019-12-28: 1000 mg via INTRAVENOUS
  Filled 2019-12-28: qty 40

## 2019-12-28 MED ORDER — PROCHLORPERAZINE MALEATE 10 MG PO TABS
10.0000 mg | ORAL_TABLET | Freq: Once | ORAL | Status: AC
  Administered 2019-12-28: 10 mg via ORAL

## 2019-12-28 MED ORDER — SODIUM CHLORIDE 0.9 % IV SOLN
200.0000 mg | Freq: Once | INTRAVENOUS | Status: AC
  Administered 2019-12-28: 200 mg via INTRAVENOUS
  Filled 2019-12-28: qty 8

## 2019-12-28 NOTE — Progress Notes (Signed)
Trevor Bailey [phone]   [phone]  OFFICE PROGRESS NOTE  [name], MD [address] [place] [address]  DIAGNOSIS:  Stage IV (T1b, N3, M1c) non-small cell lung cancer, adenocarcinoma. 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  Biomarker Findings Tumor Mutational Burden - 10 Muts/Mb Microsatellite status - MS-Stable Genomic Findings For a complete list of the genes assayed, please refer to the Appendix. ERBB2 amplification - equivocal? SWH67 R91* FANCC splice site [id number]>K SMAD4 Q289* TP53 E298* 7 Disease relevant genes with no reportable alterations: ALK, BRAF, EGFR, KRAS, MET, RET, ROS1   PDL1 expression is 30%.  PRIOR THERAPY: 1) Weekly concurrent chemoradiation with carboplatin for an AUC of 2 and paclitaxel 45 mg/m2 to the locally advanced disease in the chest. He started the first dose 01/31/2019. Status post7cycles.Last dose of chemotherapy was given on March 14, 2019. 2) palliative radiotherapy to the metastatic disease in the right adrenal gland.  CURRENT THERAPY: Systemic chemotherapy with carboplatin for AUC of 5, Alimta 500 mg/M2 and Keytruda 200 mg IV every 3 weeks. First dose April 20, 2019. Status post 12 cycles.   Starting from cycle #5, the patient is on maintenance treatment with Alimta and Keytruda every 3 weeks.  INTERVAL HISTORY: Trevor Bailey 74 y.o. male returns to the clinic today for follow-up visit.  The patient is feeling fine today with no concerning complaints except for pain in the right hip and thigh area.  He denied having any current chest pain, shortness of breath, cough or hemoptysis.  He denied having any fever or chills.  He has no nausea, vomiting, diarrhea or constipation.  He has no headache or  visual changes.  He denied having any weight loss or night sweats.  He has been tolerating his treatment with maintenance Alimta and Keytruda fairly well.  The patient had repeat CT scan of the chest, abdomen pelvis performed recently and he is here for evaluation and discussion of his scan results.  MEDICAL HISTORY: Past Medical History:  Diagnosis Date  . Adenopathy    left supraclavicular lymph node  . Allergies   . COPD (chronic obstructive pulmonary disease) ([place])   . Coronary disease   . Diabetes ([place])   . Enlarged prostate   . GERD (gastroesophageal reflux disease)   . Glaucoma   . Hyperlipidemia   . Hypertension   . Myocardial infarction ([place])   . nscl ca dx'd 12/2018  . Sleep apnea    wears CPAP  . Wears partial dentures    upper and lower    ALLERGIES:  has No Known Allergies.  MEDICATIONS:  Current Outpatient Medications  Medication Sig Dispense Refill  . ACCU-CHEK GUIDE test strip     . amLODipine (NORVASC) 5 MG tablet TAKE 1 TABLET BY MOUTH  DAILY 90 tablet 3  . aspirin EC 81 MG tablet Take 81 mg by mouth daily.    Marland Kitchen atorvastatin (LIPITOR) 80 MG tablet Take 1 tablet (80 mg total) by mouth daily. 90 tablet 3  . augmented betamethasone dipropionate (DIPROLENE-AF) 0.05 % cream     . CLARITIN 10 MG tablet Take 10 mg by mouth daily.     . clopidogrel (PLAVIX) 75 MG tablet Take 1 tablet (75 mg total) by mouth every evening. 30 tablet 0  . finasteride (  PROSCAR) 5 MG tablet Take 5 mg by mouth daily.     . folic acid (FOLVITE) 1 MG tablet TAKE 1 TABLET BY MOUTH  DAILY 90 tablet 3  . hydrochlorothiazide (HYDRODIURIL) 25 MG tablet Take 25 mg by mouth daily.     Marland Kitchen latanoprost (XALATAN) 0.005 % ophthalmic solution Place 1 drop into both eyes at bedtime.     . metFORMIN (GLUCOPHAGE) 1000 MG tablet Take 1,000 mg by mouth 2 (two) times daily.     . metoprolol tartrate (LOPRESSOR) 25 MG tablet Take 25 mg 2 (two) times daily by mouth.    . mirtazapine (REMERON) 30 MG tablet TAKE 1  TABLET BY MOUTH AT  BEDTIME 90 tablet 3  . Multiple Vitamin (MULTIVITAMIN WITH MINERALS) TABS tablet Take 1 tablet by mouth daily. Centrum Silver    . nitroGLYCERIN (NITROSTAT) 0.4 MG SL tablet Place 1 tablet (0.4 mg total) under the tongue every 5 (five) minutes as needed for chest pain. (Patient not taking: Reported on 08/02/2019) 25 tablet 6  . ofloxacin (FLOXIN) 0.3 % OTIC solution     . OVER THE COUNTER MEDICATION Take 1 tablet by mouth daily as needed. Colace    . pantoprazole (PROTONIX) 40 MG tablet Take 40 mg daily by mouth.    Vladimir Faster Glycol-Propyl Glycol (SYSTANE) 0.4-0.3 % SOLN Place 1-2 drops into both eyes 3 (three) times daily as needed (dry/irritated eyes.).    Marland Kitchen prochlorperazine (COMPAZINE) 10 MG tablet Take 1 tablet (10 mg total) by mouth every 6 (six) hours as needed for nausea or vomiting. (Patient not taking: Reported on 08/02/2019) 30 tablet 2  . ramipril (ALTACE) 10 MG capsule Take 10 mg by mouth 2 (two) times daily.     . traMADol (ULTRAM) 50 MG tablet Take 1 tablet (50 mg total) by mouth every 6 (six) hours as needed. (Patient not taking: Reported on 08/02/2019) 20 tablet 0  . VENTOLIN HFA 108 (90 Base) MCG/ACT inhaler Inhale 1-2 puffs into the lungs every 6 (six) hours as needed for wheezing or shortness of breath.  (Patient not taking: Reported on 12/07/2019)  0  . ZETIA 10 MG tablet Take 10 mg by mouth every evening.      No current facility-administered medications for this visit.    SURGICAL HISTORY:  Past Surgical History:  Procedure Laterality Date  . ABDOMINAL AORTAGRAM Left 09/16/2013   Procedure: ABDOMINAL Maxcine Ham;  Surgeon: Elam Dutch, MD;  Location[facility] C[facility] LAB;  Service: Cardiovascular;  Laterality: Left;  . CARDIAC CATHETERIZATION  01/2011   When there was segmental stenosis of the distal RCA, patent PCA stent, patent circumflex stent, and a patent small diagonal with 90% ISR.   Marland Kitchen COLONOSCOPY W/ BIOPSIES AND POLYPECTOMY    . CORONARY ANGIOPLASTY   may 2002   Non-DES stenting of his circumflex, non-DES stenting of the RCA  . HYDROCELE EXCISION / REPAIR  2009   by Dr Janice Norrie  . INGUINAL HERNIA REPAIR Bilateral    Dr Bubba Camp  . LYMPH NODE BIOPSY Left 01/10/2019   Procedure: left supraclavicular LYMPH NODE BIOPSY;  Surgeon: [name], MD;  [facility];  Service: Thoracic;  Laterality: Left;  Marland Kitchen MULTIPLE TOOTH EXTRACTIONS    . NM MYOCAR PERF WALL MOTION  01/08/2011   moderate in size and intensity area of reversible ischemia in the basal to mid inferior and septal territories. Abnormal study  . stents  2008   proximal RCA, DES for progession of disease.  Marland Kitchen  US ECHOCARDIOGRAPHY  01/12/2012   mild concentric LVH, borderline LA enlargement, mild to mod TR    REVIEW OF SYSTEMS:  Constitutional: negative Eyes: negative Ears, nose, mouth, throat, and face: negative Respiratory: negative Cardiovascular: negative Gastrointestinal: negative Genitourinary:negative Integument/breast: negative Hematologic/lymphatic: negative Musculoskeletal:positive for bone pain Neurological: negative Behavioral/Psych: negative Endocrine: negative Allergic/Immunologic: negative   PHYSICAL EXAMINATION: General appearance: alert, cooperative and no distress Head: Normocephalic, without obvious abnormality, atraumatic Neck: no adenopathy, no JVD, supple, symmetrical, trachea midline and thyroid not enlarged, symmetric, no tenderness/mass/nodules Lymph nodes: Cervical, supraclavicular, and axillary nodes normal. Resp: clear to auscultation bilaterally Back: symmetric, no curvature. ROM normal. No CVA tenderness. Cardio: regular rate and rhythm, S1, S2 normal, no murmur, click, rub or gallop GI: soft, non-tender; bowel sounds normal; no masses,  no organomegaly Extremities: extremities normal, atraumatic, no cyanosis or edema Neurologic: Alert and oriented X 3, normal strength and tone. Normal symmetric reflexes. Normal coordination and gait  ECOG  PERFORMANCE STATUS: 1 - Symptomatic but completely ambulatory  Blood pressure 126/86, pulse 81, temperature (!) 96.3 F (35.7 C), temperature source Tympanic, resp. rate 18, weight 181 lb 0.5 oz (82.1 kg), SpO2 100 %.  LABORATORY DATA: Lab Results  Component Value Date   WBC 4.5 12/28/2019   HGB 10.5 (L) 12/28/2019   HCT 31.5 (L) 12/28/2019   MCV 104.7 (H) 12/28/2019   PLT 299 12/28/2019      Chemistry      Component Value Date/Time   NA 140 12/07/2019 0837   K 4.3 12/07/2019 0837   CL 104 12/07/2019 0837   CO2 23 12/07/2019 0837   BUN 15 12/07/2019 0837   CREATININE 1.38 (H) 12/07/2019 0837   CREATININE 0.94 05/16/2014 1109      Component Value Date/Time   CALCIUM 10.5 (H) 12/07/2019 0837   ALKPHOS 120 12/07/2019 0837   AST 41 12/07/2019 0837   ALT 54 (H) 12/07/2019 0837   BILITOT 0.3 12/07/2019 0837       RADIOGRAPHIC STUDIES: CT Chest W Contrast  Result Date: 12/26/2019 CLINICAL DATA:  Non-small cell lung cancer with RIGHT adrenal gland metastasis. Chemotherapy immunotherapy ongoing. Radiation therapy to the RIGHT adrenal gland. EXAM: CT CHEST, ABDOMEN, AND PELVIS WITH CONTRAST TECHNIQUE: Multidetector CT imaging of the chest, abdomen and pelvis was performed following the standard protocol during bolus administration of intravenous contrast. CONTRAST:  142m OMNIPAQUE IOHEXOL 300 MG/ML  SOLN COMPARISON:  CT 10/25/2019, 06/20/2019 FINDINGS: CT CHEST FINDINGS Cardiovascular: Coronary artery calcification and aortic atherosclerotic calcification. Mediastinum/Nodes: No axillary or supraclavicular adenopathy. No mediastinal or hilar adenopathy. No pericardial fluid. Esophagus normal. Lungs/Pleura: LEFT upper lobe spiculated nodule measures 10 mm (image 31/6) not changed from 10 mm on CT 10/25/2019. Lesion also similar to CT from 06/20/2019. There is scarring at the LEFT RIGHT lung apex similar. No new pulmonary nodules. Musculoskeletal: No aggressive osseous lesion. CT ABDOMEN  AND PELVIS FINDINGS Hepatobiliary: Several small hypodense lesions in liver consistent benign cysts are unchanged. Pancreas: Pancreas is normal. No ductal dilatation. No pancreatic inflammation. Spleen: Normal spleen Adrenals/urinary tract: No adrenal mass. Simple fluid attenuation RIGHT renal cortical lesion. Ureters bladder normal. Stomach/Bowel: Stomach, small bowel, appendix, and cecum are normal. The colon and rectosigmoid colon are normal. Large volume stool in the rectum. Vascular/Lymphatic: Abdominal aorta is normal caliber. There is no retroperitoneal or periportal lymphadenopathy. No pelvic lymphadenopathy. Reproductive: Prostate unremarkable. Other: No free fluid or peritoneal disease. Musculoskeletal: There is enhancing mass measuring 4.0 x 2.3 cm within the RIGHT adductor muscle medial  to the lesser trochanter of the femur (image 124/2). Lesion increased in size from 2.5 by 1.5 cm on CT 10/25/2019.No aggressive osseous lesion. IMPRESSION: Chest Impression: 1. Stable spiculated LEFT upper lobe nodule. Recommend attention on follow-up. 2. No new pulmonary nodularity. 3. No mediastinal adenopathy. 4. Coronary artery calcification and Aortic Atherosclerosis (ICD10-I70.0). Abdomen / Pelvis Impression: 1. Interval enlargement of the intramuscular mass in the a upper RIGHT thigh adductor muscle. Findings consistent with MUSCULAR METASTASIS. 2. No evidence of adrenal metastasis recurrence. 3. Large volume stool in the rectum. These results will be called to the ordering clinician or representative by the Radiologist Assistant, and communication documented in the PACS or [employer]. Electronically Signed   By: Suzy Bouchard M.D.   On: 12/26/2019 11:00   CT Abdomen Pelvis W Contrast  Result Date: 12/26/2019 CLINICAL DATA:  Non-small cell lung cancer with RIGHT adrenal gland metastasis. Chemotherapy immunotherapy ongoing. Radiation therapy to the RIGHT adrenal gland. EXAM: CT CHEST, ABDOMEN, AND PELVIS  WITH CONTRAST TECHNIQUE: Multidetector CT imaging of the chest, abdomen and pelvis was performed following the standard protocol during bolus administration of intravenous contrast. CONTRAST:  133m OMNIPAQUE IOHEXOL 300 MG/ML  SOLN COMPARISON:  CT 10/25/2019, 06/20/2019 FINDINGS: CT CHEST FINDINGS Cardiovascular: Coronary artery calcification and aortic atherosclerotic calcification. Mediastinum/Nodes: No axillary or supraclavicular adenopathy. No mediastinal or hilar adenopathy. No pericardial fluid. Esophagus normal. Lungs/Pleura: LEFT upper lobe spiculated nodule measures 10 mm (image 31/6) not changed from 10 mm on CT 10/25/2019. Lesion also similar to CT from 06/20/2019. There is scarring at the LEFT RIGHT lung apex similar. No new pulmonary nodules. Musculoskeletal: No aggressive osseous lesion. CT ABDOMEN AND PELVIS FINDINGS Hepatobiliary: Several small hypodense lesions in liver consistent benign cysts are unchanged. Pancreas: Pancreas is normal. No ductal dilatation. No pancreatic inflammation. Spleen: Normal spleen Adrenals/urinary tract: No adrenal mass. Simple fluid attenuation RIGHT renal cortical lesion. Ureters bladder normal. Stomach/Bowel: Stomach, small bowel, appendix, and cecum are normal. The colon and rectosigmoid colon are normal. Large volume stool in the rectum. Vascular/Lymphatic: Abdominal aorta is normal caliber. There is no retroperitoneal or periportal lymphadenopathy. No pelvic lymphadenopathy. Reproductive: Prostate unremarkable. Other: No free fluid or peritoneal disease. Musculoskeletal: There is enhancing mass measuring 4.0 x 2.3 cm within the RIGHT adductor muscle medial to the lesser trochanter of the femur (image 124/2). Lesion increased in size from 2.5 by 1.5 cm on CT 10/25/2019.No aggressive osseous lesion. IMPRESSION: Chest Impression: 1. Stable spiculated LEFT upper lobe nodule. Recommend attention on follow-up. 2. No new pulmonary nodularity. 3. No mediastinal adenopathy.  4. Coronary artery calcification and Aortic Atherosclerosis (ICD10-I70.0). Abdomen / Pelvis Impression: 1. Interval enlargement of the intramuscular mass in the a upper RIGHT thigh adductor muscle. Findings consistent with MUSCULAR METASTASIS. 2. No evidence of adrenal metastasis recurrence. 3. Large volume stool in the rectum. These results will be called to the ordering clinician or representative by the Radiologist Assistant, and communication documented in the PACS or [employer] Electronically Signed   By: SSuzy BouchardM.D.   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 The patient completed a course of concurrent chemoradiation to the chest with weekly carboplatin for AUC of 2 and paclitaxel 45 mg/M2 status post 7 cycles..Marland Kitchen  The patient had evidence for disease progression and he was started on systemic chemotherapy with carboplatin, Alimta and Keytruda for 4 cycles and he is currently on maintenance treatment with Alimta and Keytruda every 3 weeks status post 8 more cycles. He has been tolerating this treatment well with no concerning adverse effects. He had repeat CT scan of the chest, abdomen pelvis performed recently.  I personally and independently reviewed the scan images and discussed the results with the patient today. Has a scan showed a stable disease except for interval enlargement of the antral muscular mass and the upper right side abductor muscle suspicious for metastatic disease. I recommended for the patient to continue his current treatment with maintenance Alimta and Keytruda every 3 weeks and he will proceed with cycle #13 of his treatment today as planned. For the suspicious muscular  metastasis, I referred the patient to Dr. [name] for consideration of palliative radiotherapy to this area. For the depression and lack of appetite, he will continue his treatment with Remeron 30 mg p.o. nightly. The patient will come back for follow-up visit in 3 weeks for evaluation before the next cycle of his treatment. He was advised to call immediately if he has any concerning symptoms in the interval. The patient voices understanding of current disease status and treatment options and is in agreement with the current care plan. All questions were answered. The patient knows to call the clinic with any problems, questions or concerns. We can certainly see the patient much sooner if necessary.   Disclaimer: This note was dictated with voice recognition software. Similar sounding words can inadvertently be transcribed and may not be corrected upon review.

## 2019-12-28 NOTE — Patient Instructions (Signed)
[facility] Discharge Instructions for Patients Receiving Chemotherapy  Today you received the following chemotherapy agents Keytruda and Alimta   To help prevent nausea and vomiting after your treatment, we encourage you to take your nausea medication as directed.    If you develop nausea and vomiting that is not controlled by your nausea medication, call the clinic.   BELOW ARE SYMPTOMS THAT SHOULD BE REPORTED IMMEDIATELY:  *FEVER GREATER THAN 100.5 F  *CHILLS WITH OR WITHOUT FEVER  NAUSEA AND VOMITING THAT IS NOT CONTROLLED WITH YOUR NAUSEA MEDICATION  *UNUSUAL SHORTNESS OF BREATH  *UNUSUAL BRUISING OR BLEEDING  TENDERNESS IN MOUTH AND THROAT WITH OR WITHOUT PRESENCE OF ULCERS  *URINARY PROBLEMS  *BOWEL PROBLEMS  UNUSUAL RASH Items with * indicate a potential emergency and should be followed up as soon as possible.  Feel free to call the clinic should you have any questions or concerns. The clinic phone number is [phone].  Please show the [place] at check-in to the Emergency Department and triage nurse.

## 2020-01-03 ENCOUNTER — Other Ambulatory Visit (HOSPITAL_COMMUNITY): Payer: Medicare Other

## 2020-01-05 ENCOUNTER — Other Ambulatory Visit: Payer: Self-pay | Admitting: Radiation Oncology

## 2020-01-05 ENCOUNTER — Inpatient Hospital Stay: Payer: Medicare Other

## 2020-01-05 ENCOUNTER — Ambulatory Visit
Admission: RE | Admit: 2020-01-05 | Discharge: 2020-01-05 | Disposition: A | Discharge status: Home / Self Care | Payer: Medicare Other | Source: Ambulatory Visit | Attending: Radiation Oncology | Admitting: Radiation Oncology

## 2020-01-05 ENCOUNTER — Other Ambulatory Visit: Payer: Self-pay

## 2020-01-05 ENCOUNTER — Encounter: Payer: Self-pay | Admitting: Radiation Oncology

## 2020-01-05 VITALS — BP 115/78 | HR 75 | Temp 98.5°F | Resp 18 | Wt 179.2 lb

## 2020-01-05 DIAGNOSIS — C3492 Malignant neoplasm of unspecified part of left bronchus or lung: Secondary | ICD-10-CM

## 2020-01-05 DIAGNOSIS — Z7982 Long term (current) use of aspirin: Secondary | ICD-10-CM | POA: Diagnosis not present

## 2020-01-05 DIAGNOSIS — K219 Gastro-esophageal reflux disease without esophagitis: Secondary | ICD-10-CM | POA: Insufficient documentation

## 2020-01-05 DIAGNOSIS — C3412 Malignant neoplasm of upper lobe, left bronchus or lung: Secondary | ICD-10-CM

## 2020-01-05 DIAGNOSIS — G4730 Sleep apnea, unspecified: Secondary | ICD-10-CM | POA: Insufficient documentation

## 2020-01-05 DIAGNOSIS — E785 Hyperlipidemia, unspecified: Secondary | ICD-10-CM | POA: Insufficient documentation

## 2020-01-05 DIAGNOSIS — Z7984 Long term (current) use of oral hypoglycemic drugs: Secondary | ICD-10-CM | POA: Insufficient documentation

## 2020-01-05 DIAGNOSIS — N400 Enlarged prostate without lower urinary tract symptoms: Secondary | ICD-10-CM | POA: Diagnosis not present

## 2020-01-05 DIAGNOSIS — C7971 Secondary malignant neoplasm of right adrenal gland: Secondary | ICD-10-CM | POA: Insufficient documentation

## 2020-01-05 DIAGNOSIS — Z87891 Personal history of nicotine dependence: Secondary | ICD-10-CM | POA: Insufficient documentation

## 2020-01-05 DIAGNOSIS — I2510 Atherosclerotic heart disease of native coronary artery without angina pectoris: Secondary | ICD-10-CM | POA: Insufficient documentation

## 2020-01-05 DIAGNOSIS — I252 Old myocardial infarction: Secondary | ICD-10-CM | POA: Diagnosis not present

## 2020-01-05 DIAGNOSIS — Z803 Family history of malignant neoplasm of breast: Secondary | ICD-10-CM | POA: Diagnosis not present

## 2020-01-05 DIAGNOSIS — J449 Chronic obstructive pulmonary disease, unspecified: Secondary | ICD-10-CM | POA: Diagnosis not present

## 2020-01-05 DIAGNOSIS — E119 Type 2 diabetes mellitus without complications: Secondary | ICD-10-CM | POA: Insufficient documentation

## 2020-01-05 DIAGNOSIS — I700 Atherosclerosis of aorta: Secondary | ICD-10-CM | POA: Insufficient documentation

## 2020-01-05 DIAGNOSIS — Z23 Encounter for immunization: Secondary | ICD-10-CM

## 2020-01-05 DIAGNOSIS — I10 Essential (primary) hypertension: Secondary | ICD-10-CM | POA: Diagnosis not present

## 2020-01-05 NOTE — Progress Notes (Signed)
Patient is here today for a  Re consult. .Patient denies any pain or fatigue. Patient denies any sob . States that he has difficulty with swallowing sometime. Patient denies any cough. Patient states that he does not have a good appetitie. Vitals:   01/05/20 1019  BP: 115/78  Pulse: 75  Resp: 18  Temp: 98.5 F (36.9 C)  TempSrc: Oral  SpO2: 100%  Weight: 81.3 kg

## 2020-01-05 NOTE — Progress Notes (Signed)
Radiation Oncology         [phone]) [phone] ________________________________  Name: Trevor Bailey        MRN: [id number]  Date of Service: 01/05/2020 DOB: 12-Mar-1946  [id number], Trevor Moll, MD  Heilingoetter, Cassandr*     REFERRING PHYSICIAN: Heilingoetter, Cassandr*   DIAGNOSIS: The primary encounter diagnosis was Primary malignant neoplasm of bronchus of left upper lobe ([place]). A diagnosis of Secondary malignant neoplasm of right adrenal gland [facility]) was also pertinent to this visit.   HISTORY OF PRESENT ILLNESS: Trevor Bailey is a 74 y.o. male seen at the request of Dr. 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 He is known to our clinic and received chemoRT as well as SBRT to an adrenal metastasis. He has been receiving Alimta/Keytruda systemic therapy but recent CT scan on 12/26/19 revealed an increase in muscular metastatic disease along the right up region measuring 4 x 2.3 cm within the right adductor muscle, medial to the lesser trochanter of the femur. He is seen today to discuss options of palliative radiotherapy to the muscular lesion.  PREVIOUS RADIOTHERAPY:   03/28/2019-04/07/2019 SBRT Treatment: The right adrenal gland target was treated to 50 Gy in 5 fractions.   01/31/19 - 03/16/19: The patient was treated to the disease within the left lung including his left supraclavicular nodes initially to a dose of 60 Gy using a 4 field, 3-D conformal technique. The patient then received a cone down boost treatment for an additional 6 Gy. This yielded a final total dose of 66 Gy.     PAST MEDICAL HISTORY:  Past Medical History:  Diagnosis Date  . Adenopathy    left supraclavicular lymph node  . Allergies   . COPD (chronic obstructive pulmonary disease) ([place])   . Coronary disease   . Diabetes ([place])   . Enlarged prostate   . GERD (gastroesophageal reflux disease)   . Glaucoma     . Hyperlipidemia   . Hypertension   . Myocardial infarction ([place])   . nscl ca dx'd 12/2018  . Sleep apnea    wears CPAP  . Wears partial dentures    upper and lower       PAST SURGICAL HISTORY: Past Surgical History:  Procedure Laterality Date  . ABDOMINAL AORTAGRAM Left 09/16/2013   Procedure: ABDOMINAL Maxcine Ham;  Surgeon: Elam Dutch, MD;  Location[facility] C[facility] LAB;  Service: Cardiovascular;  Laterality: Left;  . CARDIAC CATHETERIZATION  01/2011   When there was segmental stenosis of the distal RCA, patent PCA stent, patent circumflex stent, and a patent small diagonal with 90% ISR.   Marland Kitchen COLONOSCOPY W/ BIOPSIES AND POLYPECTOMY    . CORONARY ANGIOPLASTY  may 2002   Non-DES stenting of his circumflex, non-DES stenting of the RCA  . HYDROCELE EXCISION / REPAIR  2009   by Dr Janice Norrie  . INGUINAL HERNIA REPAIR Bilateral    Dr Bubba Camp  . LYMPH NODE BIOPSY Left 01/10/2019   Procedure: left supraclavicular LYMPH NODE BIOPSY;  Surgeon: [name], MD;  Location: [place];  Service: Thoracic;  Laterality: Left;  Marland Kitchen MULTIPLE TOOTH EXTRACTIONS    . NM MYOCAR PERF WALL MOTION  01/08/2011   moderate in size and intensity area of reversible ischemia in the basal to mid inferior and septal territories. Abnormal study  . stents  2008   proximal RCA, DES for progession of disease.  Marland Kitchen US ECHOCARDIOGRAPHY  01/12/2012  mild concentric LVH, borderline LA enlargement, mild to mod TR     FAMILY HISTORY:  Family History  Problem Relation Age of Onset  . Heart attack Mother   . Heart disease Mother   . Diabetes Father   . Stroke Father   . Hypertension Father   . Hyperlipidemia Sister   . Hypertension Sister   . Breast cancer Sister      SOCIAL HISTORY:  reports that he has quit smoking. His smoking use included cigarettes. He has a 5.00 pack-year smoking history. He has never used smokeless tobacco. He reports current alcohol use. He reports that he does not use drugs. The patient is  married and lives in [place].    ALLERGIES: Patient has no known allergies.   MEDICATIONS:  Current Outpatient Medications  Medication Sig Dispense Refill  . ACCU-CHEK GUIDE test strip     . amLODipine (NORVASC) 5 MG tablet TAKE 1 TABLET BY MOUTH  DAILY 90 tablet 3  . aspirin EC 81 MG tablet Take 81 mg by mouth daily.    Marland Kitchen CLARITIN 10 MG tablet Take 10 mg by mouth daily.     . clopidogrel (PLAVIX) 75 MG tablet Take 1 tablet (75 mg total) by mouth every evening. 30 tablet 0  . finasteride (PROSCAR) 5 MG tablet Take 5 mg by mouth daily.     . folic acid (FOLVITE) 1 MG tablet TAKE 1 TABLET BY MOUTH  DAILY 90 tablet 3  . hydrochlorothiazide (HYDRODIURIL) 25 MG tablet Take 25 mg by mouth daily.     Marland Kitchen latanoprost (XALATAN) 0.005 % ophthalmic solution Place 1 drop into both eyes at bedtime.     . metFORMIN (GLUCOPHAGE) 1000 MG tablet Take 1,000 mg by mouth 2 (two) times daily.     . metoprolol tartrate (LOPRESSOR) 25 MG tablet Take 25 mg 2 (two) times daily by mouth.    . mirtazapine (REMERON) 30 MG tablet TAKE 1 TABLET BY MOUTH AT  BEDTIME 90 tablet 3  . Multiple Vitamin (MULTIVITAMIN WITH MINERALS) TABS tablet Take 1 tablet by mouth daily. Centrum Silver    . ofloxacin (FLOXIN) 0.3 % OTIC solution     . OVER THE COUNTER MEDICATION Take 1 tablet by mouth daily as needed. Colace    . pantoprazole (PROTONIX) 40 MG tablet Take 40 mg daily by mouth.    Vladimir Faster Glycol-Propyl Glycol (SYSTANE) 0.4-0.3 % SOLN Place 1-2 drops into both eyes 3 (three) times daily as needed (dry/irritated eyes.).    Marland Kitchen ramipril (ALTACE) 10 MG capsule Take 10 mg by mouth 2 (two) times daily.     Marland Kitchen ZETIA 10 MG tablet Take 10 mg by mouth every evening.     Marland Kitchen atorvastatin (LIPITOR) 80 MG tablet Take 1 tablet (80 mg total) by mouth daily. 90 tablet 3  . augmented betamethasone dipropionate (DIPROLENE-AF) 0.05 % cream  (Patient not taking: Reported on 01/05/2020)    . nitroGLYCERIN (NITROSTAT) 0.4 MG SL tablet Place 1  tablet (0.4 mg total) under the tongue every 5 (five) minutes as needed for chest pain. (Patient not taking: Reported on 08/02/2019) 25 tablet 6  . prochlorperazine (COMPAZINE) 10 MG tablet Take 1 tablet (10 mg total) by mouth every 6 (six) hours as needed for nausea or vomiting. (Patient not taking: Reported on 08/02/2019) 30 tablet 2  . traMADol (ULTRAM) 50 MG tablet Take 1 tablet (50 mg total) by mouth every 6 (six) hours as needed. (Patient not taking: Reported on 08/02/2019) 20  tablet 0  . VENTOLIN HFA 108 (90 Base) MCG/ACT inhaler Inhale 1-2 puffs into the lungs every 6 (six) hours as needed for wheezing or shortness of breath.  (Patient not taking: Reported on 12/07/2019)  0   No current facility-administered medications for this encounter.     REVIEW OF SYSTEMS: On review of systems, the patient reports that he is doing well overall. He reports intermittent pain in his right mid thigh that comes and goes. He describes this as a stabbing quality, and that the pain lasts for a few seconds. Activity or movement does not seem to bring this on, nor dose stooping or bending. He denies any chest pain, shortness of breath, cough, fevers, chills, night sweats, unintended weight changes. He denies any bowel or bladder disturbances, and denies abdominal pain, nausea or vomiting. He denies any new musculoskeletal or joint aches or pains. A complete review of systems is obtained and is otherwise negative.     PHYSICAL EXAM:  Wt Readings from Last 3 Encounters:  01/05/20 179 lb 4 oz (81.3 kg)  12/28/19 181 lb 0.5 oz (82.1 kg)  12/07/19 183 lb 6.4 oz (83.2 kg)   Temp Readings from Last 3 Encounters:  01/05/20 98.5 F (36.9 C) (Oral)  12/28/19 (!) 96.3 F (35.7 C) (Tympanic)  12/07/19 98.8 F (37.1 C) (Oral)   BP Readings from Last 3 Encounters:  01/05/20 115/78  12/28/19 126/86  12/07/19 108/67   Pulse Readings from Last 3 Encounters:  01/05/20 75  12/28/19 81  12/07/19 78   Pain  Assessment Pain Score: 0-No pain/10  In general this is a chronically ill appearing African American male in no acute distress. He's alert and oriented x4 and appropriate throughout the examination. Cardiopulmonary assessment is negative for acute distress and he exhibits normal effort. No visible abnormalities are seen of the right medial thigh.   ECOG = 1  0 - Asymptomatic (Fully active, able to carry on all predisease activities without restriction)  1 - Symptomatic but completely ambulatory (Restricted in physically strenuous activity but ambulatory and able to carry out work of a light or sedentary nature. For example, light housework, office work)  2 - Symptomatic, <50% in bed during the day (Ambulatory and capable of all self care but unable to carry out any work activities. Up and about more than 50% of waking hours)  3 - Symptomatic, >50% in bed, but not bedbound (Capable of only limited self-care, confined to bed or chair 50% or more of waking hours)  4 - Bedbound (Completely disabled. Cannot carry on any self-care. Totally confined to bed or chair)  5 - Death   Eustace Pen MM, Creech RH, Tormey DC, et al. [phone]). "Toxicity and response criteria of the [facility]". [place] Oncol. 5 (6): 649-55    LABORATORY DATA:  Lab Results  Component Value Date   WBC 4.5 12/28/2019   HGB 10.5 (L) 12/28/2019   HCT 31.5 (L) 12/28/2019   MCV 104.7 (H) 12/28/2019   PLT 299 12/28/2019   Lab Results  Component Value Date   NA 139 12/28/2019   K 4.2 12/28/2019   CL 104 12/28/2019   CO2 25 12/28/2019   Lab Results  Component Value Date   ALT 42 12/28/2019   AST 38 12/28/2019   ALKPHOS 129 (H) 12/28/2019   BILITOT 0.3 12/28/2019      RADIOGRAPHY: CT Chest W Contrast  Result Date: 12/26/2019 CLINICAL DATA:  Non-small cell lung cancer with  RIGHT adrenal gland metastasis. Chemotherapy immunotherapy ongoing. Radiation therapy to the RIGHT adrenal gland. EXAM: CT  CHEST, ABDOMEN, AND PELVIS WITH CONTRAST TECHNIQUE: Multidetector CT imaging of the chest, abdomen and pelvis was performed following the standard protocol during bolus administration of intravenous contrast. CONTRAST:  168mL OMNIPAQUE IOHEXOL 300 MG/ML  SOLN COMPARISON:  CT 10/25/2019, 06/20/2019 FINDINGS: CT CHEST FINDINGS Cardiovascular: Coronary artery calcification and aortic atherosclerotic calcification. Mediastinum/Nodes: No axillary or supraclavicular adenopathy. No mediastinal or hilar adenopathy. No pericardial fluid. Esophagus normal. Lungs/Pleura: LEFT upper lobe spiculated nodule measures 10 mm (image 31/6) not changed from 10 mm on CT 10/25/2019. Lesion also similar to CT from 06/20/2019. There is scarring at the LEFT RIGHT lung apex similar. No new pulmonary nodules. Musculoskeletal: No aggressive osseous lesion. CT ABDOMEN AND PELVIS FINDINGS Hepatobiliary: Several small hypodense lesions in liver consistent benign cysts are unchanged. Pancreas: Pancreas is normal. No ductal dilatation. No pancreatic inflammation. Spleen: Normal spleen Adrenals/urinary tract: No adrenal mass. Simple fluid attenuation RIGHT renal cortical lesion. Ureters bladder normal. Stomach/Bowel: Stomach, small bowel, appendix, and cecum are normal. The colon and rectosigmoid colon are normal. Large volume stool in the rectum. Vascular/Lymphatic: Abdominal aorta is normal caliber. There is no retroperitoneal or periportal lymphadenopathy. No pelvic lymphadenopathy. Reproductive: Prostate unremarkable. Other: No free fluid or peritoneal disease. Musculoskeletal: There is enhancing mass measuring 4.0 x 2.3 cm within the RIGHT adductor muscle medial to the lesser trochanter of the femur (image 124/2). Lesion increased in size from 2.5 by 1.5 cm on CT 10/25/2019.No aggressive osseous lesion. IMPRESSION: Chest Impression: 1. Stable spiculated LEFT upper lobe nodule. Recommend attention on follow-up. 2. No new pulmonary nodularity. 3.  No mediastinal adenopathy. 4. Coronary artery calcification and Aortic Atherosclerosis (ICD10-I70.0). Abdomen / Pelvis Impression: 1. Interval enlargement of the intramuscular mass in the a upper RIGHT thigh adductor muscle. Findings consistent with MUSCULAR METASTASIS. 2. No evidence of adrenal metastasis recurrence. 3. Large volume stool in the rectum. These results will be called to the ordering clinician or representative by the Radiologist Assistant, and communication documented in the PACS or [employer]. Electronically Signed   By: Suzy Bouchard M.D.   On: 12/26/2019 11:00   CT Abdomen Pelvis W Contrast  Result Date: 12/26/2019 CLINICAL DATA:  Non-small cell lung cancer with RIGHT adrenal gland metastasis. Chemotherapy immunotherapy ongoing. Radiation therapy to the RIGHT adrenal gland. EXAM: CT CHEST, ABDOMEN, AND PELVIS WITH CONTRAST TECHNIQUE: Multidetector CT imaging of the chest, abdomen and pelvis was performed following the standard protocol during bolus administration of intravenous contrast. CONTRAST:  142mL OMNIPAQUE IOHEXOL 300 MG/ML  SOLN COMPARISON:  CT 10/25/2019, 06/20/2019 FINDINGS: CT CHEST FINDINGS Cardiovascular: Coronary artery calcification and aortic atherosclerotic calcification. Mediastinum/Nodes: No axillary or supraclavicular adenopathy. No mediastinal or hilar adenopathy. No pericardial fluid. Esophagus normal. Lungs/Pleura: LEFT upper lobe spiculated nodule measures 10 mm (image 31/6) not changed from 10 mm on CT 10/25/2019. Lesion also similar to CT from 06/20/2019. There is scarring at the LEFT RIGHT lung apex similar. No new pulmonary nodules. Musculoskeletal: No aggressive osseous lesion. CT ABDOMEN AND PELVIS FINDINGS Hepatobiliary: Several small hypodense lesions in liver consistent benign cysts are unchanged. Pancreas: Pancreas is normal. No ductal dilatation. No pancreatic inflammation. Spleen: Normal spleen Adrenals/urinary tract: No adrenal mass. Simple fluid  attenuation RIGHT renal cortical lesion. Ureters bladder normal. Stomach/Bowel: Stomach, small bowel, appendix, and cecum are normal. The colon and rectosigmoid colon are normal. Large volume stool in the rectum. Vascular/Lymphatic: Abdominal aorta is normal caliber. There is no  retroperitoneal or periportal lymphadenopathy. No pelvic lymphadenopathy. Reproductive: Prostate unremarkable. Other: No free fluid or peritoneal disease. Musculoskeletal: There is enhancing mass measuring 4.0 x 2.3 cm within the RIGHT adductor muscle medial to the lesser trochanter of the femur (image 124/2). Lesion increased in size from 2.5 by 1.5 cm on CT 10/25/2019.No aggressive osseous lesion. IMPRESSION: Chest Impression: 1. Stable spiculated LEFT upper lobe nodule. Recommend attention on follow-up. 2. No new pulmonary nodularity. 3. No mediastinal adenopathy. 4. Coronary artery calcification and Aortic Atherosclerosis (ICD10-I70.0). Abdomen / Pelvis Impression: 1. Interval enlargement of the intramuscular mass in the a upper RIGHT thigh adductor muscle. Findings consistent with MUSCULAR METASTASIS. 2. No evidence of adrenal metastasis recurrence. 3. Large volume stool in the rectum. These results will be called to the ordering clinician or representative by the Radiologist Assistant, and communication documented in the PACS or [employer]. Electronically Signed   By: Suzy Bouchard M.D.   On: 12/26/2019 11:00       IMPRESSION/PLAN: 1. 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 Dr. [name] has reviewed the patient's recent course since our last discussion. The patient has new disease in the right adductor muscle along the right femur. Dr. [name] would like the patient to proceed with MRI of the femur which has been ordered already. Dr. [name] feels that this site would be  amenable to consider a course of palliative radiothearpy while he continues with his systemic Alimta/Keytruda. We discussed the risks, benefits, short, and long term effects of radiotherapy, and the patient is interested in proceeding. I discussed the delivery and logistics of radiotherapy and that Dr. [name] anticipates a course of either ablative therapy versus 3 weeks of radiotherapy rather than two weeks due to his ongoing systemic therapy. More formal planning will be determined by his imaging. Written consent is obtained and placed in the chart, a copy was provided to the patient. He will simulate once we know the date of his upcoming MRI. 2. Covid Vaccination. The patient was willing to proceed today and will receive his 3rd dose while in our clinic.   In a visit lasting 45 minutes, greater than 50% of the time was spent face to face discussing the patient's condition, in preparation for the discussion, and coordinating the patient's care.     Carola Rhine, PAC

## 2020-01-05 NOTE — Progress Notes (Signed)
COVID-19 Vaccine Information can be found at: [email or web address] For questions related to vaccine distribution or appointments, please email [email or web address] or call [phone].       Covid-19 Vaccination Clinic  Name:  NHIA HEAPHY    MRN: [id number] DOB: 1945-09-04  01/05/2020  Trevor Bailey was observed post Covid-19 immunization for 15 minutes without incident. He was provided with Vaccine Information Sheet and instruction to access the V-Safe system.   Trevor Bailey was instructed to call 911 with any severe reactions post vaccine:  Difficulty breathing   Swelling of face and throat   A fast heartbeat   A bad rash all over body   Dizziness and weakness   Immunizations Administered    Name Date Dose VIS Date Route   Pfizer COVID-19 Vaccine 01/05/2020 12:07 PM 0.3 mL 07/13/2018 Intramuscular   Manufacturer: [place]   Lot: [id number]   [place]: [id number]

## 2020-01-06 ENCOUNTER — Telehealth: Payer: Self-pay | Admitting: *Deleted

## 2020-01-06 NOTE — Telephone Encounter (Signed)
CALLED PATIENT TO INFORM OF MRI FOR 01-12-20 - ARRIVAL TIME- 6:30 AM @ [facility], SPOKE WITH PATIENT AND HE IS AWARE OF THIS TEST

## 2020-01-12 ENCOUNTER — Other Ambulatory Visit: Payer: Self-pay

## 2020-01-12 ENCOUNTER — Ambulatory Visit (HOSPITAL_COMMUNITY)
Admission: RE | Admit: 2020-01-12 | Discharge: 2020-01-12 | Disposition: A | Discharge status: Home / Self Care | Payer: Medicare Other | Source: Ambulatory Visit | Attending: Radiation Oncology | Admitting: Radiation Oncology

## 2020-01-12 DIAGNOSIS — C3492 Malignant neoplasm of unspecified part of left bronchus or lung: Secondary | ICD-10-CM | POA: Insufficient documentation

## 2020-01-12 DIAGNOSIS — C3412 Malignant neoplasm of upper lobe, left bronchus or lung: Secondary | ICD-10-CM | POA: Diagnosis present

## 2020-01-12 MED ORDER — GADOBUTROL 1 MMOL/ML IV SOLN
8.0000 mL | Freq: Once | INTRAVENOUS | Status: AC | PRN
  Administered 2020-01-12: 8 mL via INTRAVENOUS

## 2020-01-14 NOTE — Progress Notes (Signed)
[name] OFFICE PROGRESS NOTE  [name], MD [address] [place] [address]  DIAGNOSIS: Stage IV (T1b, N3,M1c) non-small cell lung cancer, adenocarcinoma. 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  PRIOR THERAPY: 1) Weekly concurrent chemoradiation with carboplatin for an AUC of 2 and paclitaxel 45 mg/m2 to the locally advanced disease in the chest. He started the first dose 01/31/2019. Status post7cycles.Last dose of chemotherapy was given on March 14, 2019. 2) palliative radiotherapy to the metastatic disease in the right adrenal gland.  CURRENT THERAPY:  1) Systemic chemotherapy with carboplatin for AUC of 5, Alimta 500 mg/M2 and Keytruda 200 mg IV every 3 weeks. First dose April 20, 2019. Status post 13 cycles.  Starting from cycle #5, the patient began maintenance Alimta and Keytruda. 2) Palliative radiotherapy to the enlarging muscular metastasis in the right hip  within the right adductor muscle, medial to the lesser trochanter of the femur under the care of Dr. [name]. First day of radiation 01/30/20  INTERVAL HISTORY: Trevor Bailey 74 y.o. male returns to the clinic for a follow up visit. The patient is feeling well today without any concerning complaints except intermittent pain in the right hip region. He was recently found to have a muscular metastasis and has met with radiation oncology for consideration of palliative radiotherapy to this region. He is scheduled to start treatment on 01/30/20. The patient continues to tolerate treatment with alimta and [place] well without any adverse side effects. Denies any fever, chills, or night sweats. He lost about 2 lbs since his last appointment which he attributes to food not tasting good. Denies thrush. Denies any chest pain,  shortness of breath, cough, or hemoptysis. Denies any nausea, vomiting, diarrhea, or constipation. Denies any headache or visual changes. Denies any rashes or skin changes. The patient is here today for evaluation prior to starting cycle # 14   MEDICAL HISTORY: Past Medical History:  Diagnosis Date  . Adenopathy    left supraclavicular lymph node  . Allergies   . COPD (chronic obstructive pulmonary disease) ([place])   . Coronary disease   . Diabetes ([place])   . Enlarged prostate   . GERD (gastroesophageal reflux disease)   . Glaucoma   . Hyperlipidemia   . Hypertension   . Myocardial infarction ([place])   . nscl ca dx'd 12/2018  . Sleep apnea    wears CPAP  . Wears partial dentures    upper and lower    ALLERGIES:  has No Known Allergies.  MEDICATIONS:  Current Outpatient Medications  Medication Sig Dispense Refill  . atorvastatin (LIPITOR) 80 MG tablet Take 80 mg by mouth daily.    Marland Kitchen ACCU-CHEK GUIDE test strip     . amLODipine (NORVASC) 5 MG tablet TAKE 1 TABLET BY MOUTH  DAILY 90 tablet 3  . aspirin EC 81 MG tablet Take 81 mg by mouth daily.    Marland Kitchen augmented betamethasone dipropionate (DIPROLENE-AF) 0.05 % cream     . CLARITIN 10 MG tablet Take 10 mg by mouth daily.     . clopidogrel (PLAVIX) 75 MG tablet Take 1 tablet (75 mg total) by mouth every evening. 30 tablet 0  . ezetimibe (ZETIA) 10 MG tablet Take 10 mg by mouth daily.    . finasteride (PROSCAR) 5 MG tablet Take 5 mg by mouth  daily.     . folic acid (FOLVITE) 1 MG tablet TAKE 1 TABLET BY MOUTH  DAILY 90 tablet 3  . hydrochlorothiazide (HYDRODIURIL) 25 MG tablet Take 25 mg by mouth daily.     Marland Kitchen latanoprost (XALATAN) 0.005 % ophthalmic solution Place 1 drop into both eyes at bedtime.     . metFORMIN (GLUCOPHAGE) 1000 MG tablet Take 1,000 mg by mouth 2 (two) times daily.     . metoprolol tartrate (LOPRESSOR) 25 MG tablet Take 25 mg 2 (two) times daily by mouth.    . mirtazapine (REMERON) 30 MG tablet TAKE 1 TABLET BY MOUTH AT   BEDTIME 90 tablet 3  . Multiple Vitamin (MULTIVITAMIN WITH MINERALS) TABS tablet Take 1 tablet by mouth daily. Centrum Silver    . nitroGLYCERIN (NITROSTAT) 0.4 MG SL tablet Place 1 tablet (0.4 mg total) under the tongue every 5 (five) minutes as needed for chest pain. 25 tablet 6  . ofloxacin (FLOXIN) 0.3 % OTIC solution     . OVER THE COUNTER MEDICATION Take 1 tablet by mouth daily as needed. Colace    . pantoprazole (PROTONIX) 40 MG tablet Take 40 mg daily by mouth.    Bertram Gala Glycol-Propyl Glycol (SYSTANE) 0.4-0.3 % SOLN Place 1-2 drops into both eyes 3 (three) times daily as needed (dry/irritated eyes.).    Marland Kitchen prochlorperazine (COMPAZINE) 10 MG tablet Take 10 mg by mouth daily as needed for nausea or vomiting.    . ramipril (ALTACE) 10 MG capsule Take 10 mg by mouth 2 (two) times daily.      No current facility-administered medications for this visit.    SURGICAL HISTORY:  Past Surgical History:  Procedure Laterality Date  . ABDOMINAL AORTAGRAM Left 09/16/2013   Procedure: ABDOMINAL Ronny Flurry;  Surgeon: Sherren Kerns, MD;  Location[facility] CATH LAB;  Service: Cardiovascular;  Laterality: Left;  . CARDIAC CATHETERIZATION  01/2011   When there was segmental stenosis of the distal RCA, patent PCA stent, patent circumflex stent, and a patent small diagonal with 90% ISR.   Marland Kitchen COLONOSCOPY W/ BIOPSIES AND POLYPECTOMY    . CORONARY ANGIOPLASTY  may 2002   Non-DES stenting of his circumflex, non-DES stenting of the RCA  . HYDROCELE EXCISION / REPAIR  2009   by Dr Brunilda Payor  . INGUINAL HERNIA REPAIR Bilateral    Dr Lurene Shadow  . LYMPH NODE BIOPSY Left 01/10/2019   Procedure: left supraclavicular LYMPH NODE BIOPSY;  Surgeon: Corliss Skains, MD;  Location: [facility] OR;  Service: Thoracic;  Laterality: Left;  Marland Kitchen MULTIPLE TOOTH EXTRACTIONS    . NM MYOCAR PERF WALL MOTION  01/08/2011   moderate in size and intensity area of reversible ischemia in the basal to mid inferior and septal territories. Abnormal  study  . stents  2008   proximal RCA, DES for progession of disease.  Marland Kitchen US ECHOCARDIOGRAPHY  01/12/2012   mild concentric LVH, borderline LA enlargement, mild to mod TR    REVIEW OF SYSTEMS:   Review of Systems  Constitutional: Positive for decreased appetite secondary to taste changes. Negative for chills, fatigue, fever and unexpected weight change.  HENT: Negative for mouth sores, nosebleeds, sore throat and trouble swallowing.   Eyes: Negative for eye problems and icterus.  Respiratory: Negative for cough, hemoptysis, shortness of breath and wheezing.   Cardiovascular: Negative for chest pain and leg swelling.  Gastrointestinal: Negative for abdominal pain, constipation, diarrhea, nausea and vomiting.  Genitourinary: Negative for bladder incontinence, difficulty urinating, dysuria, frequency and  hematuria.   Musculoskeletal: Positive for intermittent right hip pain. Negative for back pain, gait problem, neck pain and neck stiffness.  Skin: Negative for itching and rash.  Neurological: Negative for dizziness, extremity weakness, gait problem, headaches, light-headedness and seizures.  Hematological: Negative for adenopathy. Does not bruise/bleed easily.  Psychiatric/Behavioral: Negative for confusion, depression and sleep disturbance. The patient is not nervous/anxious.      PHYSICAL EXAMINATION:  Blood pressure 120/73, pulse 74, temperature (!) 97.5 F (36.4 C), temperature source Tympanic, resp. rate 18, height $RemoveBe'5\' 9"'FrVdzvBpm$  (1.753 m), weight 179 lb 4.8 oz (81.3 kg), SpO2 100 %.  ECOG PERFORMANCE STATUS: 1 - Symptomatic but completely ambulatory  Physical Exam  Constitutional: Oriented to person, place, and time and well-developed, well-nourished, and in no distress.  HENT:  Head: Normocephalic and atraumatic.  Mouth/Throat: Oropharynx is clear and moist. No oropharyngeal exudate.  Eyes: Conjunctivae are normal. Right eye exhibits no discharge. Left eye exhibits no discharge. No scleral  icterus.  Neck: Normal range of motion. Neck supple.  Cardiovascular: Normal rate, regular rhythm, normal heart sounds and intact distal pulses.   Pulmonary/Chest: Effort normal and breath sounds normal. No respiratory distress. No wheezes. No rales.  Abdominal: Soft. Bowel sounds are normal. Exhibits no distension and no mass. There is no tenderness.  Musculoskeletal: Normal range of motion. Exhibits no edema.  Lymphadenopathy:    No cervical adenopathy.  Neurological: Alert and oriented to person, place, and time. Exhibits normal muscle tone. Gait normal. Coordination normal.  Skin: Skin is warm and dry. No rash noted. Not diaphoretic. No erythema. No pallor.  Psychiatric: Mood, memory and judgment normal.  Vitals reviewed.  LABORATORY DATA: Lab Results  Component Value Date   WBC 4.4 01/18/2020   HGB 10.2 (L) 01/18/2020   HCT 31.0 (L) 01/18/2020   MCV 107.6 (H) 01/18/2020   PLT 304 01/18/2020      Chemistry      Component Value Date/Time   NA 140 01/18/2020 0858   K 4.5 01/18/2020 0858   CL 106 01/18/2020 0858   CO2 24 01/18/2020 0858   BUN 23 01/18/2020 0858   CREATININE 1.35 (H) 01/18/2020 0858   CREATININE 0.94 05/16/2014 1109      Component Value Date/Time   CALCIUM 10.7 (H) 01/18/2020 0858   ALKPHOS 122 01/18/2020 0858   AST 24 01/18/2020 0858   ALT 31 01/18/2020 0858   BILITOT 0.2 (L) 01/18/2020 0858       RADIOGRAPHIC STUDIES:  CT Chest W Contrast  Result Date: 12/26/2019 CLINICAL DATA:  Non-small cell lung cancer with RIGHT adrenal gland metastasis. Chemotherapy immunotherapy ongoing. Radiation therapy to the RIGHT adrenal gland. EXAM: CT CHEST, ABDOMEN, AND PELVIS WITH CONTRAST TECHNIQUE: Multidetector CT imaging of the chest, abdomen and pelvis was performed following the standard protocol during bolus administration of intravenous contrast. CONTRAST:  187mL OMNIPAQUE IOHEXOL 300 MG/ML  SOLN COMPARISON:  CT 10/25/2019, 06/20/2019 FINDINGS: CT CHEST FINDINGS  Cardiovascular: Coronary artery calcification and aortic atherosclerotic calcification. Mediastinum/Nodes: No axillary or supraclavicular adenopathy. No mediastinal or hilar adenopathy. No pericardial fluid. Esophagus normal. Lungs/Pleura: LEFT upper lobe spiculated nodule measures 10 mm (image 31/6) not changed from 10 mm on CT 10/25/2019. Lesion also similar to CT from 06/20/2019. There is scarring at the LEFT RIGHT lung apex similar. No new pulmonary nodules. Musculoskeletal: No aggressive osseous lesion. CT ABDOMEN AND PELVIS FINDINGS Hepatobiliary: Several small hypodense lesions in liver consistent benign cysts are unchanged. Pancreas: Pancreas is normal. No ductal dilatation. No pancreatic  inflammation. Spleen: Normal spleen Adrenals/urinary tract: No adrenal mass. Simple fluid attenuation RIGHT renal cortical lesion. Ureters bladder normal. Stomach/Bowel: Stomach, small bowel, appendix, and cecum are normal. The colon and rectosigmoid colon are normal. Large volume stool in the rectum. Vascular/Lymphatic: Abdominal aorta is normal caliber. There is no retroperitoneal or periportal lymphadenopathy. No pelvic lymphadenopathy. Reproductive: Prostate unremarkable. Other: No free fluid or peritoneal disease. Musculoskeletal: There is enhancing mass measuring 4.0 x 2.3 cm within the RIGHT adductor muscle medial to the lesser trochanter of the femur (image 124/2). Lesion increased in size from 2.5 by 1.5 cm on CT 10/25/2019.No aggressive osseous lesion. IMPRESSION: Chest Impression: 1. Stable spiculated LEFT upper lobe nodule. Recommend attention on follow-up. 2. No new pulmonary nodularity. 3. No mediastinal adenopathy. 4. Coronary artery calcification and Aortic Atherosclerosis (ICD10-I70.0). Abdomen / Pelvis Impression: 1. Interval enlargement of the intramuscular mass in the a upper RIGHT thigh adductor muscle. Findings consistent with MUSCULAR METASTASIS. 2. No evidence of adrenal metastasis recurrence. 3.  Large volume stool in the rectum. These results will be called to the ordering clinician or representative by the Radiologist Assistant, and communication documented in the PACS or [employer]. Electronically Signed   By: Suzy Bouchard M.D.   On: 12/26/2019 11:00   CT Abdomen Pelvis W Contrast  Result Date: 12/26/2019 CLINICAL DATA:  Non-small cell lung cancer with RIGHT adrenal gland metastasis. Chemotherapy immunotherapy ongoing. Radiation therapy to the RIGHT adrenal gland. EXAM: CT CHEST, ABDOMEN, AND PELVIS WITH CONTRAST TECHNIQUE: Multidetector CT imaging of the chest, abdomen and pelvis was performed following the standard protocol during bolus administration of intravenous contrast. CONTRAST:  1106mL OMNIPAQUE IOHEXOL 300 MG/ML  SOLN COMPARISON:  CT 10/25/2019, 06/20/2019 FINDINGS: CT CHEST FINDINGS Cardiovascular: Coronary artery calcification and aortic atherosclerotic calcification. Mediastinum/Nodes: No axillary or supraclavicular adenopathy. No mediastinal or hilar adenopathy. No pericardial fluid. Esophagus normal. Lungs/Pleura: LEFT upper lobe spiculated nodule measures 10 mm (image 31/6) not changed from 10 mm on CT 10/25/2019. Lesion also similar to CT from 06/20/2019. There is scarring at the LEFT RIGHT lung apex similar. No new pulmonary nodules. Musculoskeletal: No aggressive osseous lesion. CT ABDOMEN AND PELVIS FINDINGS Hepatobiliary: Several small hypodense lesions in liver consistent benign cysts are unchanged. Pancreas: Pancreas is normal. No ductal dilatation. No pancreatic inflammation. Spleen: Normal spleen Adrenals/urinary tract: No adrenal mass. Simple fluid attenuation RIGHT renal cortical lesion. Ureters bladder normal. Stomach/Bowel: Stomach, small bowel, appendix, and cecum are normal. The colon and rectosigmoid colon are normal. Large volume stool in the rectum. Vascular/Lymphatic: Abdominal aorta is normal caliber. There is no retroperitoneal or periportal  lymphadenopathy. No pelvic lymphadenopathy. Reproductive: Prostate unremarkable. Other: No free fluid or peritoneal disease. Musculoskeletal: There is enhancing mass measuring 4.0 x 2.3 cm within the RIGHT adductor muscle medial to the lesser trochanter of the femur (image 124/2). Lesion increased in size from 2.5 by 1.5 cm on CT 10/25/2019.No aggressive osseous lesion. IMPRESSION: Chest Impression: 1. Stable spiculated LEFT upper lobe nodule. Recommend attention on follow-up. 2. No new pulmonary nodularity. 3. No mediastinal adenopathy. 4. Coronary artery calcification and Aortic Atherosclerosis (ICD10-I70.0). Abdomen / Pelvis Impression: 1. Interval enlargement of the intramuscular mass in the a upper RIGHT thigh adductor muscle. Findings consistent with MUSCULAR METASTASIS. 2. No evidence of adrenal metastasis recurrence. 3. Large volume stool in the rectum. These results will be called to the ordering clinician or representative by the Radiologist Assistant, and communication documented in the PACS or [employer]. Electronically Signed   By: Suzy Bouchard  M.D.   On: 12/26/2019 11:00   MR FEMUR RIGHT W WO CONTRAST  Result Date: 01/12/2020 CLINICAL DATA:  Patient with a history of metastatic lung adenocarcinoma. Painful mass lesion in the right groin. EXAM: MRI OF THE RIGHT FEMUR WITHOUT AND WITH CONTRAST TECHNIQUE: Multiplanar, multisequence MR imaging of the right femur was performed both before and after administration of intravenous contrast. CONTRAST:  8 mL GADAVIST IV SOLN COMPARISON:  CT chest, abdomen and pelvis 12/26/2018. FINDINGS: Bones/Joint/Cartilage No evidence of metastatic disease is seen. No fracture, stress change or focal lesion. Ligaments Intact. Muscles and Tendons As seen on the comparison CT, a lesion measuring 5 cm transverse by 3.6 cm AP x 5 cm craniocaudal is seen in the right obturator externus and adductor magnus. The majority of the lesion is isointense to muscle on T1  weighted imaging, intermediate signal intensity on T2 weighted imaging and enhances. There is a area of central necrosis within the mass measuring 1.7 cm in diameter. Otherwise negative. Soft tissues There is some soft tissue edema about the patient's mass. For findings regarding intrapelvic contents, please see report of recent CT. IMPRESSION: Findings consistent with a metastatic deposit in the right obturator externus and adductor magnus as described above. The exam is otherwise negative. Electronically Signed   By: Inge Rise M.D.   On: 01/12/2020 11:52     ASSESSMENT/PLAN:  This is a very pleasant 74 year old African-American male with stage IV non-small cell lung cancer, adenocarcinoma with no actionable mutation and PD-L1 expression of 30%,presented with locally advanced disease in the chest as well as solitary metastasis to the right adrenal gland.He has no actionable mutations.  The patientcompleted a course of concurrent chemoradiation to the chest with weekly carboplatin for AUC of 2 and paclitaxel 45 mg/M2 status post7cycles.Last dose of chemotherapy was given on March 14, 2019. He also completed palliative radiotherapy to the right adrenal gland lesion under the care of Dr. [name].  He is currentlyundergoing systemic chemotherapy with carboplatin for an AUC of 5, Alimta 500 mg per metered squared and Keytruda 200 mg IV every 3 weeks. He is status post 13 cycles. He tolerated treatment well without any concerning adverse side effects except for mild fatigue. Starting from cycle #5, he has been on maintenance treatment with Alimta and Keytruda.  Labs were reviewed. Recommend that he proceed with cycle #14 today as scheduled. .  We will see him back for a follow up visit in 3 weeks for evaluation before starting cycle #15.  He will continue to follow with radiation oncology regarding SBRT radiotherapy to the right hip muscular metastasis. He is expected to have his  first treatment on 01/30/20.  He was encouraged to use biotene and salt water rinses for his taste changes. No evidence of thrush on exam today.   The patient was advised to call immediately if she has any concerning symptoms in the interval. The patient voices understanding of current disease status and treatment options and is in agreement with the current care plan. All questions were answered. The patient knows to call the clinic with any problems, questions or concerns. We can certainly see the patient much sooner if necessary  No orders of the defined types were placed in this encounter.    Jadesola Poynter L Olita Takeshita, PA-C 01/18/20

## 2020-01-17 ENCOUNTER — Encounter: Payer: Self-pay | Admitting: Cardiovascular Disease

## 2020-01-17 ENCOUNTER — Telehealth (INDEPENDENT_AMBULATORY_CARE_PROVIDER_SITE_OTHER): Payer: Medicare Other | Admitting: Cardiovascular Disease

## 2020-01-17 VITALS — BP 112/76 | HR 90 | Ht 69.0 in | Wt 175.0 lb

## 2020-01-17 DIAGNOSIS — E785 Hyperlipidemia, unspecified: Secondary | ICD-10-CM | POA: Diagnosis not present

## 2020-01-17 DIAGNOSIS — I2510 Atherosclerotic heart disease of native coronary artery without angina pectoris: Secondary | ICD-10-CM | POA: Diagnosis not present

## 2020-01-17 DIAGNOSIS — E1159 Type 2 diabetes mellitus with other circulatory complications: Secondary | ICD-10-CM

## 2020-01-17 DIAGNOSIS — I10 Essential (primary) hypertension: Secondary | ICD-10-CM | POA: Diagnosis not present

## 2020-01-17 DIAGNOSIS — G4733 Obstructive sleep apnea (adult) (pediatric): Secondary | ICD-10-CM | POA: Diagnosis not present

## 2020-01-17 DIAGNOSIS — Z9861 Coronary angioplasty status: Secondary | ICD-10-CM

## 2020-01-17 DIAGNOSIS — C3492 Malignant neoplasm of unspecified part of left bronchus or lung: Secondary | ICD-10-CM

## 2020-01-17 DIAGNOSIS — Z9989 Dependence on other enabling machines and devices: Secondary | ICD-10-CM

## 2020-01-17 NOTE — Patient Instructions (Signed)

## 2020-01-17 NOTE — Progress Notes (Signed)
Virtual Visit via Telephone Note   This visit type was conducted due to national recommendations for restrictions regarding the COVID-19 Pandemic (e.g. social distancing) in an effort to limit this patient's exposure and mitigate transmission in our community.  Due to his co-morbid illnesses, this patient is at least at moderate risk for complications without adequate follow up.  This format is felt to be most appropriate for this patient at this time.  The patient did not have access to video technology/had technical difficulties with video requiring transitioning to audio format only (telephone).  All issues noted in this document were discussed and addressed.  No physical exam could be performed with this format.  Please refer to the patient's chart for his  consent to telehealth for [facility].    Date:  01/19/2020   ID:  Trevor Bailey, DOB 01/23/1946, MRN [id number] The patient was identified using 2 identifiers.  Patient Location: Home Provider Location: Office/Clinic  PCP:  [name], MD  Cardiologist:  Shelva Majestic, MD  Electrophysiologist:  None   Evaluation Performed:  Follow-Up Visit  Chief Complaint:    History of Present Illness:    Trevor Bailey is a 74 y.o. male who has a history of hypertension, type 2 diabetes mellitus, CAD, and hyperlipidemia. In May 2000 he underwent mid circumflex and proximal diagonal PTCA/non DES stenting. In 2002, he underwent non-DES stenting of the circumflex coronary artery as well as mid RCA.  In 2008, he underwent DES stenting of the proximal RCA. His last catheterization in September 2012 showed segmental disease of the distal RCA with a patent RCA stent, a patent circumflex stent and the small diagonal vessel with 90% in-stent restenosis that was felt to be a very small vessel and medical therapy was recommended. Subsequently he has done well without recurrent anginal symptoms. An echo Doppler study in August 2013 showed mild concentric  LVH with grade 1 diastolic dysfunction. There was mild mitral annular calcification with mild MR, mild to moderate TR. His last nuclear perfusion study in 2012 was mildly abnormal and led to the cardiac catheterization.  The patient has obstructive sleep apnea and has been on CPAP therapy for >7 years. He had smoked for approximately 50 years. He sees Dr. Delfina Redwood for primary care.   He sees Dr.Charles Fields  for PVD and in February 2011 underwent lower extremity angiographic study  and was found to have 70% stenosis of the right common femoral artery, and multilevel areas of 30-50% stenoses of the superficial femoral artery bilaterally, left greater than right, and 2 vessel runoff with peroneal and posterior tibial artery bilaterally.  He underwent physical therapy and has noticed significant improvement in his prior claudication.  I  saw him in November 2018 at which time he continued to do well.  He denied any episodes of chest pain or shortness of breath.  He keeps active, caring for 4 grandchildren and 3 great-grandchildren.  He walks at least 3 days per week.  He was unaware any change in claudication symptoms.  He has not seen Dr. Oneida Alar in over a year.  He has continued to be on ramipril 10 mg, metoprolol, tartrate 25 mg twice a day, and HCTZ 25 mg daily for hypertension.  He is on aspirin and Plavix for dual antiplatelet therapy.  He denies bleeding.  He is on combination therapy with atorvastatin 40 mg and Zetia 10 mg.  He takes real lip to for his lung disease.  He is diabetic on  metformin 1000 mg twice a day.  He takes pantoprazole for GERD, which is controlled.  He admits to 100% compliance with CPAP.  He had blood work done by Dr. Delfina Redwood in August 2018.  Total cholesterol was 160, HDL 62, LDL 77, triglycerides 109.  Hemoglobin A1c was 6.7.  Had normal LFTs.  Renal function was normal with a creatinine of 0.95.    I last saw him in December 2019.  At that time his claudication was better and  he was walking 2-3 times per week for at least 2 miles at a time in addition to exercising on a bicycle.  He denied any chest pain or shortness of breath.  He continued to use CPAP.  He was evaluated in a telemedicine visit in June 2020 and at that time was without anginal symptoms.  He was on Lipitor and Zetia for hyperlipidemia.  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  He is followed by Dr. Earlie Server.  He is undergoing chemoradiation.  He gets a CT scan every 3 months.  He recently was felt to have disease progression and he is now on maintenance Alimta and Keytruda.  He denies any anginal symptoms or shortness of breath.  He presents for evaluation.  The patient does not have symptoms concerning for COVID-19 infection (fever, chills, cough, or new shortness of breath).    Past Medical History:  Diagnosis Date  . Adenopathy    left supraclavicular lymph node  . Allergies   . COPD (chronic obstructive pulmonary disease) ([place])   . Coronary disease   . Diabetes ([place])   . Enlarged prostate   . GERD (gastroesophageal reflux disease)   . Glaucoma   . Hyperlipidemia   . Hypertension   . Myocardial infarction ([place])   . nscl ca dx'd 12/2018  . Sleep apnea    wears CPAP  . Wears partial dentures    upper and lower   Past Surgical History:  Procedure Laterality Date  . ABDOMINAL AORTAGRAM Left 09/16/2013   Procedure: ABDOMINAL Maxcine Ham;  Surgeon: Elam Dutch, MD;  Location: [facility] CATH LAB;  Service: Cardiovascular;  Laterality: Left;  . CARDIAC CATHETERIZATION  01/2011   When there was segmental stenosis of the distal RCA, patent PCA stent, patent circumflex stent, and a patent small  diagonal with 90% ISR.   Marland Kitchen COLONOSCOPY W/ BIOPSIES AND POLYPECTOMY    . CORONARY ANGIOPLASTY  may 2002   Non-DES stenting of his circumflex, non-DES stenting of the RCA  . HYDROCELE EXCISION / REPAIR  2009   by Dr Janice Norrie  . INGUINAL HERNIA REPAIR Bilateral    Dr Bubba Camp  . LYMPH NODE BIOPSY Left 01/10/2019   Procedure: left supraclavicular LYMPH NODE BIOPSY;  Surgeon: [name], MD;  Location: [facility];  Service: Thoracic;  Laterality: Left;  Marland Kitchen MULTIPLE TOOTH EXTRACTIONS    . NM MYOCAR PERF WALL MOTION  01/08/2011   moderate in size and intensity area of reversible ischemia in the basal to mid inferior and septal territories. Abnormal study  . stents  2008   proximal RCA, DES for progession of disease.  Marland Kitchen US ECHOCARDIOGRAPHY  01/12/2012   mild concentric LVH, borderline LA enlargement, mild  to mod TR     Current Meds  Medication Sig  . ACCU-CHEK GUIDE test strip   . amLODipine (NORVASC) 5 MG tablet TAKE 1 TABLET BY MOUTH  DAILY  . aspirin EC 81 MG tablet Take 81 mg by mouth daily.  Marland Kitchen augmented betamethasone dipropionate (DIPROLENE-AF) 0.05 % cream   . CLARITIN 10 MG tablet Take 10 mg by mouth daily.   . clopidogrel (PLAVIX) 75 MG tablet Take 1 tablet (75 mg total) by mouth every evening.  . ezetimibe (ZETIA) 10 MG tablet Take 10 mg by mouth daily.  . finasteride (PROSCAR) 5 MG tablet Take 5 mg by mouth daily.   . folic acid (FOLVITE) 1 MG tablet TAKE 1 TABLET BY MOUTH  DAILY  . hydrochlorothiazide (HYDRODIURIL) 25 MG tablet Take 25 mg by mouth daily.   Marland Kitchen latanoprost (XALATAN) 0.005 % ophthalmic solution Place 1 drop into both eyes at bedtime.   . metFORMIN (GLUCOPHAGE) 1000 MG tablet Take 1,000 mg by mouth 2 (two) times daily.   . metoprolol tartrate (LOPRESSOR) 25 MG tablet Take 25 mg 2 (two) times daily by mouth.  . mirtazapine (REMERON) 30 MG tablet TAKE 1 TABLET BY MOUTH AT  BEDTIME  . Multiple Vitamin (MULTIVITAMIN WITH MINERALS) TABS tablet Take 1 tablet by mouth daily.  Centrum Silver  . nitroGLYCERIN (NITROSTAT) 0.4 MG SL tablet Place 1 tablet (0.4 mg total) under the tongue every 5 (five) minutes as needed for chest pain.  Marland Kitchen ofloxacin (FLOXIN) 0.3 % OTIC solution   . OVER THE COUNTER MEDICATION Take 1 tablet by mouth daily as needed. Colace  . pantoprazole (PROTONIX) 40 MG tablet Take 40 mg daily by mouth.  Vladimir Faster Glycol-Propyl Glycol (SYSTANE) 0.4-0.3 % SOLN Place 1-2 drops into both eyes 3 (three) times daily as needed (dry/irritated eyes.).  Marland Kitchen prochlorperazine (COMPAZINE) 10 MG tablet Take 10 mg by mouth daily as needed for nausea or vomiting.  . ramipril (ALTACE) 10 MG capsule Take 10 mg by mouth 2 (two) times daily.      Allergies:   Patient has no known allergies.   Social History   Tobacco Use  . Smoking status: Former Smoker    Packs/day: 0.10    Years: 50.00    Pack years: 5.00    Types: Cigarettes  . Smokeless tobacco: Never Used  . Tobacco comment: quit smoking cigarettes July 2020  Vaping Use  . Vaping Use: Never used  Substance Use Topics  . Alcohol use: Yes    Alcohol/week: 0.0 standard drinks    Comment: moderately  . Drug use: No     Family Hx: The patient's family history includes Breast cancer in his sister; Diabetes in his father; Heart attack in his mother; Heart disease in his mother; Hyperlipidemia in his sister; Hypertension in his father and sister; Stroke in his father.  ROS:   Please see the history of present illness.    No fevers chills night sweats No cough Positive for stage IV adenocarcinoma non-small cell lung cancer Positive for diabetes Positive for OSA on CPAP No neurologic symptoms All other systems reviewed and are negative.   Prior CV studies:   The following studies were reviewed today:  N/A  Labs/Other Tests and Data Reviewed:    EKG: No ECG was obtained today.  I personally reviewed the last ECG from May 04, 2018 which shows sinus rhythm at 72 bpm.  Mild first-degree AV block  with a PR interval of 2 8 ms.  Nonspecific ST changes inferolaterally  Recent Labs: 01/18/2020: ALT 31; BUN 23; Creatinine 1.35; Hemoglobin 10.2; Platelet Count 304; Potassium 4.5; Sodium 140; TSH 0.699   Recent Lipid Panel Lab Results  Component Value Date/Time   CHOL 139 05/16/2014 11:15 AM   TRIG 111 05/16/2014 11:15 AM   HDL 49 05/16/2014 11:15 AM   CHOLHDL 2.8 05/16/2014 11:15 AM   LDLCALC 68 05/16/2014 11:15 AM    Wt Readings from Last 3 Encounters:  01/18/20 179 lb 4.8 oz (81.3 kg)  01/17/20 175 lb (79.4 kg)  01/05/20 179 lb 4 oz (81.3 kg)     Objective:    Vital Signs:  BP 112/76   Pulse 90   Ht 5\' 9"  (1.753 m)   Wt 175 lb (79.4 kg)   BMI 25.84 kg/m    Since this was a virtual visit I could not physically examine the patient. Breathing was normal and not labored There is no wheezing Rhythm was regular and stable by palpation He did not have any chest wall tenderness He denied abdominal pain He denied any edema Affect was normal    ASSESSMENT & PLAN:    1. CAD S/P percutaneous coronary angioplasty   2. Hyperlipidemia with target LDL less than 70   3. Essential hypertension   4. OSA on CPAP   5. Type 2 diabetes mellitus with other circulatory complication, without long-term current use of insulin ([place])   6. Non-small cell carcinoma of lung, left [facility])    Trevor Bailey is a 74 year old gentleman who has known CAD and is status post multiple interventions to the circumflex, diagonal, RCA initiating in May 2000.  He has a history of essential hypertension.  Presently, his blood pressure is well controlled on a medical regimen consisting of amlodipine 5 mg, HCTZ 25 mg, metoprolol tartrate 25 mg twice a day and ramipril 10 mg.  He is not having any anginal symptomatology.  He is diabetic on Metformin.  He has continued to be on aspirin Plavix long-term particularly with his multiple coronary interventions.  Unfortunately, he was diagnosed with stage IV non-small  cell lung cancer after presenting with left upper lobe lung nodule in addition to left supraclavicular lymphadenopathy metastases to the right adrenal gland and possible muscular lesion along the right inferior pubic ramus.  He has been undergoing systemic chemotherapy with Dr. Earlie Server.  He is tolerating chemotherapy well.  He presently is without shortness of breath or having chest pain.  He denies any leg swelling.  He continues to be on Zetia for hyperlipidemia.  LDL cholesterol in April 2021 was 89.  Ideally target LDL is less than 70 from a certain he was taken off prior statin therapy due to his chemotherapy.  He continues to be followed by Dr. Delfina Redwood who checks laboratory. He appears to be fairly stable from a cardiac standpoint.  I will see him in 1 year for reevaluation or sooner as needed.  COVID-19 Education: The signs and symptoms of COVID-19 were discussed with the patient and how to seek care for testing (follow up with PCP or arrange E-visit).  The importance of social distancing was discussed today.  Time:   Today, I have spent 20 minutes with the patient with telehealth technology discussing the above problems.     Medication Adjustments/Labs and Tests Ordered: Current medicines are reviewed at length with the patient today.  Concerns regarding medicines are outlined above.   Tests Ordered: No orders of the defined types were placed in  this encounter.   Medication Changes: No orders of the defined types were placed in this encounter.   Follow Up: 1 year  Signed, Shelva Majestic, MD  01/19/2020 8:20 PM    [facility]

## 2020-01-18 ENCOUNTER — Ambulatory Visit
Admission: RE | Admit: 2020-01-18 | Discharge: 2020-01-18 | Disposition: A | Discharge status: Home / Self Care | Payer: Medicare Other | Source: Ambulatory Visit | Attending: Radiation Oncology | Admitting: Radiation Oncology

## 2020-01-18 ENCOUNTER — Other Ambulatory Visit: Payer: Self-pay

## 2020-01-18 ENCOUNTER — Inpatient Hospital Stay: Payer: Medicare Other

## 2020-01-18 ENCOUNTER — Inpatient Hospital Stay: Payer: Medicare Other | Attending: Internal Medicine | Admitting: Physician Assistant

## 2020-01-18 VITALS — BP 120/73 | HR 74 | Temp 97.5°F | Resp 18 | Ht 69.0 in | Wt 179.3 lb

## 2020-01-18 DIAGNOSIS — C3412 Malignant neoplasm of upper lobe, left bronchus or lung: Secondary | ICD-10-CM | POA: Insufficient documentation

## 2020-01-18 DIAGNOSIS — Z5112 Encounter for antineoplastic immunotherapy: Secondary | ICD-10-CM | POA: Insufficient documentation

## 2020-01-18 DIAGNOSIS — C7971 Secondary malignant neoplasm of right adrenal gland: Secondary | ICD-10-CM | POA: Diagnosis not present

## 2020-01-18 DIAGNOSIS — Z79899 Other long term (current) drug therapy: Secondary | ICD-10-CM | POA: Diagnosis not present

## 2020-01-18 DIAGNOSIS — Z510 Encounter for antineoplastic radiation therapy: Secondary | ICD-10-CM | POA: Diagnosis not present

## 2020-01-18 DIAGNOSIS — Z5111 Encounter for antineoplastic chemotherapy: Secondary | ICD-10-CM

## 2020-01-18 LAB — CMP (CANCER CENTER ONLY)
ALT: 31 U/L (ref 0–44)
AST: 24 U/L (ref 15–41)
Albumin: 3.1 g/dL — ABNORMAL LOW (ref 3.5–5.0)
Alkaline Phosphatase: 122 U/L (ref 38–126)
Anion gap: 10 (ref 5–15)
BUN: 23 mg/dL (ref 8–23)
CO2: 24 mmol/L (ref 22–32)
Calcium: 10.7 mg/dL — ABNORMAL HIGH (ref 8.9–10.3)
Chloride: 106 mmol/L (ref 98–111)
Creatinine: 1.35 mg/dL — ABNORMAL HIGH (ref 0.61–1.24)
GFR, Est AFR Am: 60 mL/min — ABNORMAL LOW (ref >60)
GFR, Estimated: 51 mL/min — ABNORMAL LOW (ref >60)
Glucose, Bld: 96 mg/dL (ref 70–99)
Potassium: 4.5 mmol/L (ref 3.5–5.1)
Sodium: 140 mmol/L (ref 135–145)
Total Bilirubin: 0.2 mg/dL — ABNORMAL LOW (ref 0.3–1.2)
Total Protein: 7.4 g/dL (ref 6.5–8.1)

## 2020-01-18 LAB — CBC WITH DIFFERENTIAL (CANCER CENTER ONLY)
Abs Immature Granulocytes: 0.01 10*3/uL (ref 0.00–0.07)
Basophils Absolute: 0.1 10*3/uL (ref 0.0–0.1)
Basophils Relative: 1 %
Eosinophils Absolute: 0.1 10*3/uL (ref 0.0–0.5)
Eosinophils Relative: 2 %
HCT: 31 % — ABNORMAL LOW (ref 39.0–52.0)
Hemoglobin: 10.2 g/dL — ABNORMAL LOW (ref 13.0–17.0)
Immature Granulocytes: 0 %
Lymphocytes Relative: 14 %
Lymphs Abs: 0.6 10*3/uL — ABNORMAL LOW (ref 0.7–4.0)
MCH: 35.4 pg — ABNORMAL HIGH (ref 26.0–34.0)
MCHC: 32.9 g/dL (ref 30.0–36.0)
MCV: 107.6 fL — ABNORMAL HIGH (ref 80.0–100.0)
Monocytes Absolute: 0.7 10*3/uL (ref 0.1–1.0)
Monocytes Relative: 15 %
Neutro Abs: 3 10*3/uL (ref 1.7–7.7)
Neutrophils Relative %: 68 %
Platelet Count: 304 10*3/uL (ref 150–400)
RBC: 2.88 MIL/uL — ABNORMAL LOW (ref 4.22–5.81)
RDW: 13.6 % (ref 11.5–15.5)
WBC Count: 4.4 10*3/uL (ref 4.0–10.5)
nRBC: 0 % (ref 0.0–0.2)

## 2020-01-18 LAB — TSH: TSH: 0.699 u[IU]/mL (ref 0.320–4.118)

## 2020-01-18 MED ORDER — SODIUM CHLORIDE 0.9 % IV SOLN
200.0000 mg | Freq: Once | INTRAVENOUS | Status: AC
  Administered 2020-01-18: 200 mg via INTRAVENOUS
  Filled 2020-01-18: qty 8

## 2020-01-18 MED ORDER — SODIUM CHLORIDE 0.9 % IV SOLN
500.0000 mg/m2 | Freq: Once | INTRAVENOUS | Status: AC
  Administered 2020-01-18: 1000 mg via INTRAVENOUS
  Filled 2020-01-18: qty 40

## 2020-01-18 MED ORDER — SODIUM CHLORIDE 0.9 % IV SOLN
Freq: Once | INTRAVENOUS | Status: AC
  Filled 2020-01-18: qty 250

## 2020-01-18 MED ORDER — PROCHLORPERAZINE MALEATE 10 MG PO TABS
ORAL_TABLET | ORAL | Status: AC
  Filled 2020-01-18: qty 1

## 2020-01-18 MED ORDER — PROCHLORPERAZINE MALEATE 10 MG PO TABS
10.0000 mg | ORAL_TABLET | Freq: Once | ORAL | Status: AC
  Administered 2020-01-18: 10 mg via ORAL

## 2020-01-18 NOTE — Patient Instructions (Signed)
[facility] Discharge Instructions for Patients Receiving Chemotherapy  Today you received the following chemotherapy agents Keytruda and Alimta   To help prevent nausea and vomiting after your treatment, we encourage you to take your nausea medication as directed.    If you develop nausea and vomiting that is not controlled by your nausea medication, call the clinic.   BELOW ARE SYMPTOMS THAT SHOULD BE REPORTED IMMEDIATELY:  *FEVER GREATER THAN 100.5 F  *CHILLS WITH OR WITHOUT FEVER  NAUSEA AND VOMITING THAT IS NOT CONTROLLED WITH YOUR NAUSEA MEDICATION  *UNUSUAL SHORTNESS OF BREATH  *UNUSUAL BRUISING OR BLEEDING  TENDERNESS IN MOUTH AND THROAT WITH OR WITHOUT PRESENCE OF ULCERS  *URINARY PROBLEMS  *BOWEL PROBLEMS  UNUSUAL RASH Items with * indicate a potential emergency and should be followed up as soon as possible.  Feel free to call the clinic should you have any questions or concerns. The clinic phone number is [phone].  Please show the [facility] at check-in to the Emergency Department and triage nurse.

## 2020-01-19 ENCOUNTER — Telehealth: Payer: Self-pay | Admitting: Internal Medicine

## 2020-01-19 ENCOUNTER — Encounter: Payer: Self-pay | Admitting: Cardiovascular Disease

## 2020-01-19 NOTE — Telephone Encounter (Signed)
Scheduled per los. Called and spoke with patient. Confirmed appt 

## 2020-01-25 ENCOUNTER — Ambulatory Visit: Payer: Medicare Other | Admitting: Radiation Oncology

## 2020-01-26 ENCOUNTER — Ambulatory Visit: Payer: Medicare Other

## 2020-01-26 DIAGNOSIS — Z510 Encounter for antineoplastic radiation therapy: Secondary | ICD-10-CM | POA: Diagnosis not present

## 2020-01-27 ENCOUNTER — Ambulatory Visit: Payer: Medicare Other

## 2020-01-30 ENCOUNTER — Other Ambulatory Visit: Payer: Self-pay

## 2020-01-30 ENCOUNTER — Ambulatory Visit
Admission: RE | Admit: 2020-01-30 | Discharge: 2020-01-30 | Disposition: A | Discharge status: Home / Self Care | Payer: Medicare Other | Source: Ambulatory Visit | Attending: Radiation Oncology | Admitting: Radiation Oncology

## 2020-01-30 ENCOUNTER — Ambulatory Visit: Payer: Medicare Other

## 2020-01-30 ENCOUNTER — Ambulatory Visit: Payer: Medicare Other | Admitting: Radiation Oncology

## 2020-01-30 DIAGNOSIS — Z510 Encounter for antineoplastic radiation therapy: Secondary | ICD-10-CM | POA: Diagnosis not present

## 2020-01-31 ENCOUNTER — Ambulatory Visit
Admission: RE | Admit: 2020-01-31 | Discharge: 2020-01-31 | Disposition: A | Discharge status: Home / Self Care | Payer: Medicare Other | Source: Ambulatory Visit | Attending: Radiation Oncology | Admitting: Radiation Oncology

## 2020-01-31 ENCOUNTER — Other Ambulatory Visit: Payer: Self-pay

## 2020-01-31 ENCOUNTER — Ambulatory Visit: Payer: Medicare Other

## 2020-01-31 ENCOUNTER — Ambulatory Visit: Payer: Medicare Other | Admitting: Radiation Oncology

## 2020-01-31 DIAGNOSIS — Z510 Encounter for antineoplastic radiation therapy: Secondary | ICD-10-CM | POA: Diagnosis not present

## 2020-02-01 ENCOUNTER — Ambulatory Visit
Admission: RE | Admit: 2020-02-01 | Discharge: 2020-02-01 | Disposition: A | Discharge status: Home / Self Care | Payer: Medicare Other | Source: Ambulatory Visit | Attending: Radiation Oncology | Admitting: Radiation Oncology

## 2020-02-01 ENCOUNTER — Ambulatory Visit: Payer: Medicare Other

## 2020-02-01 ENCOUNTER — Ambulatory Visit: Payer: Medicare Other | Admitting: Radiation Oncology

## 2020-02-01 DIAGNOSIS — Z510 Encounter for antineoplastic radiation therapy: Secondary | ICD-10-CM | POA: Diagnosis not present

## 2020-02-02 ENCOUNTER — Ambulatory Visit: Payer: Medicare Other

## 2020-02-02 ENCOUNTER — Other Ambulatory Visit: Payer: Self-pay

## 2020-02-02 ENCOUNTER — Ambulatory Visit: Payer: Medicare Other | Admitting: Radiation Oncology

## 2020-02-02 ENCOUNTER — Ambulatory Visit
Admission: RE | Admit: 2020-02-02 | Discharge: 2020-02-02 | Disposition: A | Discharge status: Home / Self Care | Payer: Medicare Other | Source: Ambulatory Visit | Attending: Radiation Oncology | Admitting: Radiation Oncology

## 2020-02-02 DIAGNOSIS — Z510 Encounter for antineoplastic radiation therapy: Secondary | ICD-10-CM | POA: Diagnosis not present

## 2020-02-03 ENCOUNTER — Other Ambulatory Visit: Payer: Self-pay

## 2020-02-03 ENCOUNTER — Ambulatory Visit: Payer: Medicare Other

## 2020-02-03 ENCOUNTER — Ambulatory Visit: Payer: Medicare Other | Admitting: Radiation Oncology

## 2020-02-03 ENCOUNTER — Ambulatory Visit
Admission: RE | Admit: 2020-02-03 | Discharge: 2020-02-03 | Disposition: A | Discharge status: Home / Self Care | Payer: Medicare Other | Source: Ambulatory Visit | Attending: Radiation Oncology | Admitting: Radiation Oncology

## 2020-02-03 DIAGNOSIS — Z510 Encounter for antineoplastic radiation therapy: Secondary | ICD-10-CM | POA: Diagnosis not present

## 2020-02-06 ENCOUNTER — Ambulatory Visit
Admission: RE | Admit: 2020-02-06 | Discharge: 2020-02-06 | Disposition: A | Discharge status: Home / Self Care | Payer: Medicare Other | Source: Ambulatory Visit | Attending: Radiation Oncology | Admitting: Radiation Oncology

## 2020-02-06 ENCOUNTER — Ambulatory Visit: Payer: Medicare Other | Admitting: Radiation Oncology

## 2020-02-06 ENCOUNTER — Other Ambulatory Visit: Payer: Self-pay

## 2020-02-06 ENCOUNTER — Ambulatory Visit: Payer: Medicare Other

## 2020-02-06 DIAGNOSIS — Z510 Encounter for antineoplastic radiation therapy: Secondary | ICD-10-CM | POA: Diagnosis not present

## 2020-02-07 ENCOUNTER — Ambulatory Visit
Admission: RE | Admit: 2020-02-07 | Discharge: 2020-02-07 | Disposition: A | Discharge status: Home / Self Care | Payer: Medicare Other | Source: Ambulatory Visit | Attending: Radiation Oncology | Admitting: Radiation Oncology

## 2020-02-07 ENCOUNTER — Ambulatory Visit: Payer: Medicare Other

## 2020-02-07 ENCOUNTER — Other Ambulatory Visit: Payer: Self-pay

## 2020-02-07 ENCOUNTER — Ambulatory Visit: Payer: Medicare Other | Admitting: Radiation Oncology

## 2020-02-07 DIAGNOSIS — Z510 Encounter for antineoplastic radiation therapy: Secondary | ICD-10-CM | POA: Diagnosis not present

## 2020-02-08 ENCOUNTER — Ambulatory Visit: Payer: Medicare Other

## 2020-02-08 ENCOUNTER — Other Ambulatory Visit: Payer: Self-pay

## 2020-02-08 ENCOUNTER — Inpatient Hospital Stay: Payer: Medicare Other

## 2020-02-08 ENCOUNTER — Inpatient Hospital Stay: Payer: Medicare Other | Admitting: Internal Medicine

## 2020-02-08 ENCOUNTER — Ambulatory Visit: Payer: Medicare Other | Admitting: Radiation Oncology

## 2020-02-08 ENCOUNTER — Ambulatory Visit
Admission: RE | Admit: 2020-02-08 | Discharge: 2020-02-08 | Disposition: A | Discharge status: Home / Self Care | Payer: Medicare Other | Source: Ambulatory Visit | Attending: Radiation Oncology | Admitting: Radiation Oncology

## 2020-02-08 ENCOUNTER — Encounter: Payer: Self-pay | Admitting: Internal Medicine

## 2020-02-08 VITALS — BP 119/74 | HR 81 | Temp 97.2°F | Resp 18 | Ht 69.0 in | Wt 178.2 lb

## 2020-02-08 DIAGNOSIS — C3490 Malignant neoplasm of unspecified part of unspecified bronchus or lung: Secondary | ICD-10-CM | POA: Diagnosis not present

## 2020-02-08 DIAGNOSIS — C3412 Malignant neoplasm of upper lobe, left bronchus or lung: Secondary | ICD-10-CM | POA: Diagnosis not present

## 2020-02-08 DIAGNOSIS — C7971 Secondary malignant neoplasm of right adrenal gland: Secondary | ICD-10-CM | POA: Diagnosis not present

## 2020-02-08 DIAGNOSIS — Z5112 Encounter for antineoplastic immunotherapy: Secondary | ICD-10-CM

## 2020-02-08 DIAGNOSIS — I10 Essential (primary) hypertension: Secondary | ICD-10-CM | POA: Diagnosis not present

## 2020-02-08 DIAGNOSIS — Z510 Encounter for antineoplastic radiation therapy: Secondary | ICD-10-CM | POA: Diagnosis not present

## 2020-02-08 LAB — CBC WITH DIFFERENTIAL (CANCER CENTER ONLY)
Abs Immature Granulocytes: 0.02 10*3/uL (ref 0.00–0.07)
Basophils Absolute: 0 10*3/uL (ref 0.0–0.1)
Basophils Relative: 1 %
Eosinophils Absolute: 0.1 10*3/uL (ref 0.0–0.5)
Eosinophils Relative: 2 %
HCT: 28.6 % — ABNORMAL LOW (ref 39.0–52.0)
Hemoglobin: 9.4 g/dL — ABNORMAL LOW (ref 13.0–17.0)
Immature Granulocytes: 0 %
Lymphocytes Relative: 12 %
Lymphs Abs: 0.6 10*3/uL — ABNORMAL LOW (ref 0.7–4.0)
MCH: 34.6 pg — ABNORMAL HIGH (ref 26.0–34.0)
MCHC: 32.9 g/dL (ref 30.0–36.0)
MCV: 105.1 fL — ABNORMAL HIGH (ref 80.0–100.0)
Monocytes Absolute: 0.8 10*3/uL (ref 0.1–1.0)
Monocytes Relative: 16 %
Neutro Abs: 3.3 10*3/uL (ref 1.7–7.7)
Neutrophils Relative %: 69 %
Platelet Count: 328 10*3/uL (ref 150–400)
RBC: 2.72 MIL/uL — ABNORMAL LOW (ref 4.22–5.81)
RDW: 14 % (ref 11.5–15.5)
WBC Count: 4.7 10*3/uL (ref 4.0–10.5)
nRBC: 0 % (ref 0.0–0.2)

## 2020-02-08 LAB — CMP (CANCER CENTER ONLY)
ALT: 31 U/L (ref 0–44)
AST: 28 U/L (ref 15–41)
Albumin: 3 g/dL — ABNORMAL LOW (ref 3.5–5.0)
Alkaline Phosphatase: 119 U/L (ref 38–126)
Anion gap: 5 (ref 5–15)
BUN: 21 mg/dL (ref 8–23)
CO2: 27 mmol/L (ref 22–32)
Calcium: 9.5 mg/dL (ref 8.9–10.3)
Chloride: 105 mmol/L (ref 98–111)
Creatinine: 1.59 mg/dL — ABNORMAL HIGH (ref 0.61–1.24)
GFR, Est AFR Am: 48 mL/min — ABNORMAL LOW (ref >60)
GFR, Estimated: 42 mL/min — ABNORMAL LOW (ref >60)
Glucose, Bld: 127 mg/dL — ABNORMAL HIGH (ref 70–99)
Potassium: 4.5 mmol/L (ref 3.5–5.1)
Sodium: 137 mmol/L (ref 135–145)
Total Bilirubin: 0.3 mg/dL (ref 0.3–1.2)
Total Protein: 7.3 g/dL (ref 6.5–8.1)

## 2020-02-08 LAB — TSH: TSH: 0.693 u[IU]/mL (ref 0.320–4.118)

## 2020-02-08 MED ORDER — HEPARIN SOD (PORK) LOCK FLUSH 100 UNIT/ML IV SOLN
500.0000 [IU] | Freq: Once | INTRAVENOUS | Status: DC | PRN
  Filled 2020-02-08: qty 5

## 2020-02-08 MED ORDER — PROCHLORPERAZINE MALEATE 10 MG PO TABS
ORAL_TABLET | ORAL | Status: AC
  Filled 2020-02-08: qty 1

## 2020-02-08 MED ORDER — PROCHLORPERAZINE MALEATE 10 MG PO TABS: 10.0000 mg | ORAL_TABLET | Freq: Once | ORAL | Status: DC

## 2020-02-08 MED ORDER — SODIUM CHLORIDE 0.9 % IV SOLN
Freq: Once | INTRAVENOUS | Status: AC
  Filled 2020-02-08: qty 250

## 2020-02-08 MED ORDER — SODIUM CHLORIDE 0.9 % IV SOLN
200.0000 mg | Freq: Once | INTRAVENOUS | Status: AC
  Administered 2020-02-08: 200 mg via INTRAVENOUS
  Filled 2020-02-08: qty 8

## 2020-02-08 MED ORDER — SODIUM CHLORIDE 0.9% FLUSH
10.0000 mL | INTRAVENOUS | Status: DC | PRN
  Filled 2020-02-08: qty 10

## 2020-02-08 NOTE — Patient Instructions (Signed)
[facility] Discharge Instructions for Patients Receiving Chemotherapy  Today you received the following chemotherapy agents:  Keytruda.  To help prevent nausea and vomiting after your treatment, we encourage you to take your nausea medication as directed.   If you develop nausea and vomiting that is not controlled by your nausea medication, call the clinic.   BELOW ARE SYMPTOMS THAT SHOULD BE REPORTED IMMEDIATELY:  *FEVER GREATER THAN 100.5 F  *CHILLS WITH OR WITHOUT FEVER  NAUSEA AND VOMITING THAT IS NOT CONTROLLED WITH YOUR NAUSEA MEDICATION  *UNUSUAL SHORTNESS OF BREATH  *UNUSUAL BRUISING OR BLEEDING  TENDERNESS IN MOUTH AND THROAT WITH OR WITHOUT PRESENCE OF ULCERS  *URINARY PROBLEMS  *BOWEL PROBLEMS  UNUSUAL RASH Items with * indicate a potential emergency and should be followed up as soon as possible.  Feel free to call the clinic should you have any questions or concerns. The clinic phone number is [phone].  Please show the CHEMO ALERT CARD at check-in to the Emergency Department and triage nurse.    

## 2020-02-08 NOTE — Progress Notes (Signed)
[facility] [phone]   [phone]  OFFICE PROGRESS NOTE  [name], MD [address] [place] [address]  DIAGNOSIS:  Stage IV (T1b, N3, M1c) non-small cell lung cancer, adenocarcinoma. 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  Biomarker Findings Tumor Mutational Burden - 10 Muts/Mb Microsatellite status - MS-Stable Genomic Findings For a complete list of the genes assayed, please refer to the Appendix. ERBB2 amplification - equivocal? QBH41 P37* FANCC splice site [id number]>X SMAD4 Q289* TP53 E298* 7 Disease relevant genes with no reportable alterations: ALK, BRAF, EGFR, KRAS, MET, RET, ROS1   PDL1 expression is 30%.  PRIOR THERAPY: 1) Weekly concurrent chemoradiation with carboplatin for an AUC of 2 and paclitaxel 45 mg/m2 to the locally advanced disease in the chest. He started the first dose 01/31/2019. Status post7cycles.Last dose of chemotherapy was given on March 14, 2019. 2) palliative radiotherapy to the metastatic disease in the right adrenal gland. 3) palliative radiotherapy to the metastatic deposit in the right obturator externus and abductor magnus under the care of Dr. [name].  CURRENT THERAPY: Systemic chemotherapy with carboplatin for AUC of 5, Alimta 500 mg/M2 and Keytruda 200 mg IV every 3 weeks. First dose April 20, 2019. Status post 14 cycles.   Starting from cycle #5, the patient is on maintenance treatment with Alimta and Keytruda every 3 weeks.  INTERVAL HISTORY: Trevor Bailey 74 y.o. male returns to the clinic today for follow-up visit.  The patient is feeling fine today with no concerning complaints.  He is currently undergoing palliative radiotherapy to the metastatic deposit in the right obturator externus and  abductor magnus under the care of Dr. [name].  He is tolerating this treatment well.  He denied having any current chest pain, shortness of breath, cough or hemoptysis.  He denied having any fever or chills.  He has no nausea, vomiting, diarrhea or constipation.  He has no headache or visual changes.  He continues to tolerate his treatment with maintenance Alimta and Keytruda fairly well.  The patient is here today for evaluation before starting cycle #15.  MEDICAL HISTORY: Past Medical History:  Diagnosis Date  . Adenopathy    left supraclavicular lymph node  . Allergies   . COPD (chronic obstructive pulmonary disease) ([place])   . Coronary disease   . Diabetes ([place])   . Enlarged prostate   . GERD (gastroesophageal reflux disease)   . Glaucoma   . Hyperlipidemia   . Hypertension   . Myocardial infarction ([place])   . nscl ca dx'd 12/2018  . Sleep apnea    wears CPAP  . Wears partial dentures    upper and lower    ALLERGIES:  has No Known Allergies.  MEDICATIONS:  Current Outpatient Medications  Medication Sig Dispense Refill  . ACCU-CHEK GUIDE test strip     . amLODipine (NORVASC) 5 MG tablet TAKE 1 TABLET BY MOUTH  DAILY 90 tablet 3  . aspirin EC 81 MG tablet Take 81 mg by mouth daily.    Marland Kitchen atorvastatin (LIPITOR) 80 MG tablet Take 80 mg by mouth daily.    Marland Kitchen augmented betamethasone dipropionate (DIPROLENE-AF) 0.05 % cream     . CLARITIN 10 MG tablet Take 10 mg by mouth daily.     . clopidogrel (PLAVIX) 75 MG tablet Take  1 tablet (75 mg total) by mouth every evening. 30 tablet 0  . ezetimibe (ZETIA) 10 MG tablet Take 10 mg by mouth daily.    . finasteride (PROSCAR) 5 MG tablet Take 5 mg by mouth daily.     . folic acid (FOLVITE) 1 MG tablet TAKE 1 TABLET BY MOUTH  DAILY 90 tablet 3  . hydrochlorothiazide (HYDRODIURIL) 25 MG tablet Take 25 mg by mouth daily.     Marland Kitchen latanoprost (XALATAN) 0.005 % ophthalmic solution Place 1 drop into both eyes at bedtime.     . metFORMIN (GLUCOPHAGE)  1000 MG tablet Take 1,000 mg by mouth 2 (two) times daily.     . metoprolol tartrate (LOPRESSOR) 25 MG tablet Take 25 mg 2 (two) times daily by mouth.    . mirtazapine (REMERON) 30 MG tablet TAKE 1 TABLET BY MOUTH AT  BEDTIME 90 tablet 3  . Multiple Vitamin (MULTIVITAMIN WITH MINERALS) TABS tablet Take 1 tablet by mouth daily. Centrum Silver    . nitroGLYCERIN (NITROSTAT) 0.4 MG SL tablet Place 1 tablet (0.4 mg total) under the tongue every 5 (five) minutes as needed for chest pain. 25 tablet 6  . ofloxacin (FLOXIN) 0.3 % OTIC solution     . OVER THE COUNTER MEDICATION Take 1 tablet by mouth daily as needed. Colace    . pantoprazole (PROTONIX) 40 MG tablet Take 40 mg daily by mouth.    Vladimir Faster Glycol-Propyl Glycol (SYSTANE) 0.4-0.3 % SOLN Place 1-2 drops into both eyes 3 (three) times daily as needed (dry/irritated eyes.).    Marland Kitchen prochlorperazine (COMPAZINE) 10 MG tablet Take 10 mg by mouth daily as needed for nausea or vomiting.    . ramipril (ALTACE) 10 MG capsule Take 10 mg by mouth 2 (two) times daily.      No current facility-administered medications for this visit.    SURGICAL HISTORY:  Past Surgical History:  Procedure Laterality Date  . ABDOMINAL AORTAGRAM Left 09/16/2013   Procedure: ABDOMINAL Maxcine Ham;  Surgeon: Elam Dutch, MD;  Location: [facility] CATH LAB;  Service: Cardiovascular;  Laterality: Left;  . CARDIAC CATHETERIZATION  01/2011   When there was segmental stenosis of the distal RCA, patent PCA stent, patent circumflex stent, and a patent small diagonal with 90% ISR.   Marland Kitchen COLONOSCOPY W/ BIOPSIES AND POLYPECTOMY    . CORONARY ANGIOPLASTY  may 2002   Non-DES stenting of his circumflex, non-DES stenting of the RCA  . HYDROCELE EXCISION / REPAIR  2009   by Dr Janice Norrie  . INGUINAL HERNIA REPAIR Bilateral    Dr Bubba Camp  . LYMPH NODE BIOPSY Left 01/10/2019   Procedure: left supraclavicular LYMPH NODE BIOPSY;  Surgeon: [name], MD;  Location: [facility];  Service: Thoracic;   Laterality: Left;  Marland Kitchen MULTIPLE TOOTH EXTRACTIONS    . NM MYOCAR PERF WALL MOTION  01/08/2011   moderate in size and intensity area of reversible ischemia in the basal to mid inferior and septal territories. Abnormal study  . stents  2008   proximal RCA, DES for progession of disease.  Marland Kitchen US ECHOCARDIOGRAPHY  01/12/2012   mild concentric LVH, borderline LA enlargement, mild to mod TR    REVIEW OF SYSTEMS:  Constitutional: positive for fatigue Eyes: negative Ears, nose, mouth, throat, and face: negative Respiratory: negative Cardiovascular: negative Gastrointestinal: negative Genitourinary:negative Integument/breast: negative Hematologic/lymphatic: negative Musculoskeletal:positive for bone pain Neurological: negative Behavioral/Psych: negative Endocrine: negative Allergic/Immunologic: negative   PHYSICAL EXAMINATION: General appearance: alert, cooperative and no distress Head:  Normocephalic, without obvious abnormality, atraumatic Neck: no adenopathy, no JVD, supple, symmetrical, trachea midline and thyroid not enlarged, symmetric, no tenderness/mass/nodules Lymph nodes: Cervical, supraclavicular, and axillary nodes normal. Resp: clear to auscultation bilaterally Back: symmetric, no curvature. ROM normal. No CVA tenderness. Cardio: regular rate and rhythm, S1, S2 normal, no murmur, click, rub or gallop GI: soft, non-tender; bowel sounds normal; no masses,  no organomegaly Extremities: extremities normal, atraumatic, no cyanosis or edema Neurologic: Alert and oriented X 3, normal strength and tone. Normal symmetric reflexes. Normal coordination and gait  ECOG PERFORMANCE STATUS: 1 - Symptomatic but completely ambulatory  Blood pressure 119/74, pulse 81, temperature (!) 97.2 F (36.2 C), temperature source Tympanic, resp. rate 18, height $RemoveBe'5\' 9"'HhfVilcFx$  (1.753 m), weight 178 lb 3.2 oz (80.8 kg), SpO2 100 %.  LABORATORY DATA: Lab Results  Component Value Date   WBC 4.7 02/08/2020   HGB 9.4  (L) 02/08/2020   HCT 28.6 (L) 02/08/2020   MCV 105.1 (H) 02/08/2020   PLT 328 02/08/2020      Chemistry      Component Value Date/Time   NA 137 02/08/2020 1115   K 4.5 02/08/2020 1115   CL 105 02/08/2020 1115   CO2 27 02/08/2020 1115   BUN 21 02/08/2020 1115   CREATININE 1.59 (H) 02/08/2020 1115   CREATININE 0.94 05/16/2014 1109      Component Value Date/Time   CALCIUM 9.5 02/08/2020 1115   ALKPHOS 119 02/08/2020 1115   AST 28 02/08/2020 1115   ALT 31 02/08/2020 1115   BILITOT 0.3 02/08/2020 1115       RADIOGRAPHIC STUDIES: MR FEMUR RIGHT W WO CONTRAST  Result Date: 01/12/2020 CLINICAL DATA:  Patient with a history of metastatic lung adenocarcinoma. Painful mass lesion in the right groin. EXAM: MRI OF THE RIGHT FEMUR WITHOUT AND WITH CONTRAST TECHNIQUE: Multiplanar, multisequence MR imaging of the right femur was performed both before and after administration of intravenous contrast. CONTRAST:  8 mL GADAVIST IV SOLN COMPARISON:  CT chest, abdomen and pelvis 12/26/2018. FINDINGS: Bones/Joint/Cartilage No evidence of metastatic disease is seen. No fracture, stress change or focal lesion. Ligaments Intact. Muscles and Tendons As seen on the comparison CT, a lesion measuring 5 cm transverse by 3.6 cm AP x 5 cm craniocaudal is seen in the right obturator externus and adductor magnus. The majority of the lesion is isointense to muscle on T1 weighted imaging, intermediate signal intensity on T2 weighted imaging and enhances. There is a area of central necrosis within the mass measuring 1.7 cm in diameter. Otherwise negative. Soft tissues There is some soft tissue edema about the patient's mass. For findings regarding intrapelvic contents, please see report of recent CT. IMPRESSION: Findings consistent with a metastatic deposit in the right obturator externus and adductor magnus as described above. The exam is otherwise negative. Electronically Signed   By: Inge Rise M.D.   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 The patient completed a course of concurrent chemoradiation to the chest with weekly carboplatin for AUC of 2 and paclitaxel 45 mg/M2 status post 7 cycles..  The patient had evidence for disease progression and he was started on systemic chemotherapy with carboplatin, Alimta and Keytruda for 4 cycles  and he is currently on maintenance treatment with Alimta and Keytruda every 3 weeks status post 10 more cycles. The patient continues to tolerate his treatment well with no concerning adverse effects. I recommended for him to proceed with cycle #11 of the maintenance therapy today as planned but only with single agent Keytruda because of the worsening renal insufficiency.  We may resume his treatment with Alimta in the future once his renal function is better. I will see him back for follow-up visit in 3 weeks for evaluation with repeat CT scan of the chest, abdomen pelvis for restaging of his disease For the suspicious muscular metastasis in the right femur, he is currently undergoing palliative radiotherapy under the care of Dr. [name]. For the depression and lack of appetite, he will continue his treatment with Remeron 30 mg p.o. nightly. The patient was advised to call immediately if he has any concerning symptoms in the interval. The patient voices understanding of current disease status and treatment options and is in agreement with the current care plan. All questions were answered. The patient knows to call the clinic with any problems, questions or concerns. We can certainly see the patient much sooner if necessary.   Disclaimer: This note was dictated with voice recognition  software. Similar sounding words can inadvertently be transcribed and may not be corrected upon review.

## 2020-02-08 NOTE — Progress Notes (Signed)
Per Dr. Julien Nordmann, okay for patient to receive [place] only today for treatment with creatine 1.59.

## 2020-02-09 ENCOUNTER — Ambulatory Visit: Payer: Medicare Other

## 2020-02-09 ENCOUNTER — Other Ambulatory Visit: Payer: Self-pay

## 2020-02-09 ENCOUNTER — Ambulatory Visit: Payer: Medicare Other | Admitting: Radiation Oncology

## 2020-02-09 ENCOUNTER — Ambulatory Visit
Admission: RE | Admit: 2020-02-09 | Discharge: 2020-02-09 | Disposition: A | Discharge status: Home / Self Care | Payer: Medicare Other | Source: Ambulatory Visit | Attending: Radiation Oncology | Admitting: Radiation Oncology

## 2020-02-09 DIAGNOSIS — Z510 Encounter for antineoplastic radiation therapy: Secondary | ICD-10-CM | POA: Diagnosis not present

## 2020-02-10 ENCOUNTER — Ambulatory Visit: Payer: Medicare Other

## 2020-02-10 ENCOUNTER — Other Ambulatory Visit: Payer: Self-pay

## 2020-02-10 ENCOUNTER — Ambulatory Visit: Payer: Medicare Other | Admitting: Radiation Oncology

## 2020-02-10 ENCOUNTER — Ambulatory Visit
Admission: RE | Admit: 2020-02-10 | Discharge: 2020-02-10 | Disposition: A | Discharge status: Home / Self Care | Payer: Medicare Other | Source: Ambulatory Visit | Attending: Radiation Oncology | Admitting: Radiation Oncology

## 2020-02-10 DIAGNOSIS — Z510 Encounter for antineoplastic radiation therapy: Secondary | ICD-10-CM | POA: Diagnosis not present

## 2020-02-13 ENCOUNTER — Ambulatory Visit: Payer: Medicare Other | Admitting: Radiation Oncology

## 2020-02-13 ENCOUNTER — Ambulatory Visit: Payer: Medicare Other

## 2020-02-13 ENCOUNTER — Other Ambulatory Visit: Payer: Self-pay

## 2020-02-13 ENCOUNTER — Ambulatory Visit
Admission: RE | Admit: 2020-02-13 | Discharge: 2020-02-13 | Disposition: A | Discharge status: Home / Self Care | Payer: Medicare Other | Source: Ambulatory Visit | Attending: Radiation Oncology | Admitting: Radiation Oncology

## 2020-02-13 DIAGNOSIS — Z510 Encounter for antineoplastic radiation therapy: Secondary | ICD-10-CM | POA: Diagnosis not present

## 2020-02-14 ENCOUNTER — Ambulatory Visit
Admission: RE | Admit: 2020-02-14 | Discharge: 2020-02-14 | Disposition: A | Discharge status: Home / Self Care | Payer: Medicare Other | Source: Ambulatory Visit | Attending: Radiation Oncology | Admitting: Radiation Oncology

## 2020-02-14 ENCOUNTER — Ambulatory Visit: Payer: Medicare Other | Admitting: Radiation Oncology

## 2020-02-14 ENCOUNTER — Ambulatory Visit: Payer: Medicare Other

## 2020-02-14 DIAGNOSIS — Z510 Encounter for antineoplastic radiation therapy: Secondary | ICD-10-CM | POA: Diagnosis not present

## 2020-02-15 ENCOUNTER — Ambulatory Visit
Admission: RE | Admit: 2020-02-15 | Discharge: 2020-02-15 | Disposition: A | Discharge status: Home / Self Care | Payer: Medicare Other | Source: Ambulatory Visit | Attending: Radiation Oncology | Admitting: Radiation Oncology

## 2020-02-15 ENCOUNTER — Ambulatory Visit: Payer: Medicare Other | Admitting: Radiation Oncology

## 2020-02-15 DIAGNOSIS — Z510 Encounter for antineoplastic radiation therapy: Secondary | ICD-10-CM | POA: Diagnosis not present

## 2020-02-16 ENCOUNTER — Ambulatory Visit: Payer: Medicare Other | Admitting: Radiation Oncology

## 2020-02-16 ENCOUNTER — Ambulatory Visit
Admission: RE | Admit: 2020-02-16 | Discharge: 2020-02-16 | Disposition: A | Discharge status: Home / Self Care | Payer: Medicare Other | Source: Ambulatory Visit | Attending: Radiation Oncology | Admitting: Radiation Oncology

## 2020-02-16 DIAGNOSIS — Z510 Encounter for antineoplastic radiation therapy: Secondary | ICD-10-CM | POA: Diagnosis not present

## 2020-02-17 ENCOUNTER — Ambulatory Visit
Admission: RE | Admit: 2020-02-17 | Discharge: 2020-02-17 | Disposition: A | Discharge status: Home / Self Care | Payer: Medicare Other | Source: Ambulatory Visit | Attending: Radiation Oncology | Admitting: Radiation Oncology

## 2020-02-17 ENCOUNTER — Encounter: Payer: Self-pay | Admitting: Radiation Oncology

## 2020-02-17 ENCOUNTER — Ambulatory Visit: Payer: Medicare Other | Admitting: Radiation Oncology

## 2020-02-17 DIAGNOSIS — C3412 Malignant neoplasm of upper lobe, left bronchus or lung: Secondary | ICD-10-CM | POA: Insufficient documentation

## 2020-02-17 DIAGNOSIS — Z510 Encounter for antineoplastic radiation therapy: Secondary | ICD-10-CM | POA: Insufficient documentation

## 2020-02-28 ENCOUNTER — Other Ambulatory Visit: Payer: Self-pay

## 2020-02-28 ENCOUNTER — Encounter (HOSPITAL_COMMUNITY): Payer: Self-pay

## 2020-02-28 ENCOUNTER — Ambulatory Visit (HOSPITAL_COMMUNITY)
Admission: RE | Admit: 2020-02-28 | Discharge: 2020-02-28 | Disposition: A | Discharge status: Home / Self Care | Payer: Medicare Other | Source: Ambulatory Visit | Attending: Internal Medicine | Admitting: Internal Medicine

## 2020-02-28 DIAGNOSIS — C3490 Malignant neoplasm of unspecified part of unspecified bronchus or lung: Secondary | ICD-10-CM | POA: Diagnosis not present

## 2020-02-29 ENCOUNTER — Inpatient Hospital Stay: Payer: Medicare Other

## 2020-02-29 ENCOUNTER — Inpatient Hospital Stay: Payer: Medicare Other | Attending: Internal Medicine | Admitting: Internal Medicine

## 2020-02-29 ENCOUNTER — Other Ambulatory Visit: Payer: Self-pay

## 2020-02-29 ENCOUNTER — Encounter: Payer: Self-pay | Admitting: Internal Medicine

## 2020-02-29 ENCOUNTER — Inpatient Hospital Stay: Payer: Medicare Other | Admitting: Nutrition

## 2020-02-29 VITALS — BP 97/77 | HR 78 | Temp 97.1°F | Resp 18 | Ht 69.0 in | Wt 178.0 lb

## 2020-02-29 DIAGNOSIS — Z79899 Other long term (current) drug therapy: Secondary | ICD-10-CM | POA: Insufficient documentation

## 2020-02-29 DIAGNOSIS — C7971 Secondary malignant neoplasm of right adrenal gland: Secondary | ICD-10-CM | POA: Insufficient documentation

## 2020-02-29 DIAGNOSIS — C3412 Malignant neoplasm of upper lobe, left bronchus or lung: Secondary | ICD-10-CM | POA: Diagnosis present

## 2020-02-29 DIAGNOSIS — I10 Essential (primary) hypertension: Secondary | ICD-10-CM

## 2020-02-29 DIAGNOSIS — C770 Secondary and unspecified malignant neoplasm of lymph nodes of head, face and neck: Secondary | ICD-10-CM | POA: Diagnosis not present

## 2020-02-29 DIAGNOSIS — Z5112 Encounter for antineoplastic immunotherapy: Secondary | ICD-10-CM

## 2020-02-29 LAB — CBC WITH DIFFERENTIAL (CANCER CENTER ONLY)
Abs Immature Granulocytes: 0.01 10*3/uL (ref 0.00–0.07)
Basophils Absolute: 0 10*3/uL (ref 0.0–0.1)
Basophils Relative: 1 %
Eosinophils Absolute: 0.1 10*3/uL (ref 0.0–0.5)
Eosinophils Relative: 2 %
HCT: 30.2 % — ABNORMAL LOW (ref 39.0–52.0)
Hemoglobin: 10.1 g/dL — ABNORMAL LOW (ref 13.0–17.0)
Immature Granulocytes: 0 %
Lymphocytes Relative: 13 %
Lymphs Abs: 0.6 10*3/uL — ABNORMAL LOW (ref 0.7–4.0)
MCH: 34.6 pg — ABNORMAL HIGH (ref 26.0–34.0)
MCHC: 33.4 g/dL (ref 30.0–36.0)
MCV: 103.4 fL — ABNORMAL HIGH (ref 80.0–100.0)
Monocytes Absolute: 0.7 10*3/uL (ref 0.1–1.0)
Monocytes Relative: 15 %
Neutro Abs: 3.1 10*3/uL (ref 1.7–7.7)
Neutrophils Relative %: 69 %
Platelet Count: 234 10*3/uL (ref 150–400)
RBC: 2.92 MIL/uL — ABNORMAL LOW (ref 4.22–5.81)
RDW: 13.2 % (ref 11.5–15.5)
WBC Count: 4.5 10*3/uL (ref 4.0–10.5)
nRBC: 0 % (ref 0.0–0.2)

## 2020-02-29 LAB — CMP (CANCER CENTER ONLY)
ALT: 27 U/L (ref 0–44)
AST: 26 U/L (ref 15–41)
Albumin: 3.2 g/dL — ABNORMAL LOW (ref 3.5–5.0)
Alkaline Phosphatase: 107 U/L (ref 38–126)
Anion gap: 6 (ref 5–15)
BUN: 27 mg/dL — ABNORMAL HIGH (ref 8–23)
CO2: 26 mmol/L (ref 22–32)
Calcium: 10.1 mg/dL (ref 8.9–10.3)
Chloride: 105 mmol/L (ref 98–111)
Creatinine: 1.62 mg/dL — ABNORMAL HIGH (ref 0.61–1.24)
GFR, Estimated: 41 mL/min — ABNORMAL LOW (ref >60)
Glucose, Bld: 141 mg/dL — ABNORMAL HIGH (ref 70–99)
Potassium: 4.8 mmol/L (ref 3.5–5.1)
Sodium: 137 mmol/L (ref 135–145)
Total Bilirubin: 0.3 mg/dL (ref 0.3–1.2)
Total Protein: 7.6 g/dL (ref 6.5–8.1)

## 2020-02-29 LAB — TSH: TSH: 0.757 u[IU]/mL (ref 0.320–4.118)

## 2020-02-29 MED ORDER — PROCHLORPERAZINE MALEATE 10 MG PO TABS
ORAL_TABLET | ORAL | Status: AC
  Filled 2020-02-29: qty 1

## 2020-02-29 MED ORDER — SODIUM CHLORIDE 0.9 % IV SOLN
200.0000 mg | Freq: Once | INTRAVENOUS | Status: AC
  Administered 2020-02-29: 200 mg via INTRAVENOUS
  Filled 2020-02-29: qty 8

## 2020-02-29 MED ORDER — SODIUM CHLORIDE 0.9 % IV SOLN
Freq: Once | INTRAVENOUS | Status: AC
  Filled 2020-02-29: qty 250

## 2020-02-29 MED ORDER — PROCHLORPERAZINE MALEATE 10 MG PO TABS
10.0000 mg | ORAL_TABLET | Freq: Once | ORAL | Status: AC
  Administered 2020-02-29: 10 mg via ORAL

## 2020-02-29 NOTE — Progress Notes (Signed)
Per Dr. Julien Nordmann: okay to treat patient with Scr. of 1.68. Treat with Keytruda only, no Alimta today.

## 2020-02-29 NOTE — Progress Notes (Signed)
Patient was identified to be at risk for malnutrition on the MST secondary to poor appetite and weight loss.  74 year old male diagnosed with stage IV non-small cell lung cancer.  He is followed by Dr. Julien Nordmann and Dr. [name].  Past medical history includes COPD, diabetes, GERD, hyperlipidemia, hypertension, and MI.  Medications include Glucophage, Remeron, multivitamin, Protonix, and Compazine.  Labs include glucose 127, creatinine 1.59, and albumin 3.0.  Height: 5 feet 9 inches. Weight: 178 pounds on October 13. Usual body weight: 200 pounds in January. BMI: 26.29.  Patient reports he continues to have a poor appetite despite Remeron. He denies nausea, vomiting, constipation, or diarrhea. He is having difficulty swallowing.  States he has to drink water after each bite to help ease swallowing difficulty. He is drinking strawberry Ensure plus twice a day.  Nutrition diagnosis: Unintended weight loss related to cancer and associated treatments as evidenced by 11% weight loss from usual body weight.  Intervention: Patient educated to increase calories and protein in small frequent meals and snacks. Recommended patient continue Ensure Plus or equivalent twice daily between meals.  Provided coupons. Educated on strategies for easing difficulty swallowing. Provided fact sheets.  Questions were answered.  Teach back method used.  Monitoring, evaluation, goals: Patient will tolerate adequate calories and protein to minimize further weight loss.  Next visit: To be scheduled as needed with treatment.  Patient has my contact information for questions or concerns.  **Disclaimer: This note was dictated with voice recognition software. Similar sounding words can inadvertently be transcribed and this note may contain transcription errors which may not have been corrected upon publication of note.**

## 2020-02-29 NOTE — Progress Notes (Signed)
[facility] [phone]   [phone]  OFFICE PROGRESS NOTE  [name], MD [address] [place] [address]  DIAGNOSIS:  Stage IV (T1b, N3, M1c) non-small cell lung cancer, adenocarcinoma. 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  Biomarker Findings Tumor Mutational Burden - 10 Muts/Mb Microsatellite status - MS-Stable Genomic Findings For a complete list of the genes assayed, please refer to the Appendix. ERBB2 amplification - equivocal? VPX10 G26* FANCC splice site 948+5I>O SMAD4 Q289* TP53 E298* 7 Disease relevant genes with no reportable alterations: ALK, BRAF, EGFR, KRAS, MET, RET, ROS1   PDL1 expression is 30%.  PRIOR THERAPY: 1) Weekly concurrent chemoradiation with carboplatin for an AUC of 2 and paclitaxel 45 mg/m2 to the locally advanced disease in the chest. He started the first dose 01/31/2019. Status post7cycles.Last dose of chemotherapy was given on March 14, 2019. 2) palliative radiotherapy to the metastatic disease in the right adrenal gland. 3) palliative radiotherapy to the metastatic deposit in the right obturator externus and abductor magnus under the care of Dr. [name].  CURRENT THERAPY: Systemic chemotherapy with carboplatin for AUC of 5, Alimta 500 mg/M2 and Keytruda 200 mg IV every 3 weeks. First dose April 20, 2019. Status post 14 cycles.   Starting from cycle #5, the patient is on maintenance treatment with Alimta and Keytruda every 3 weeks.  INTERVAL HISTORY: Trevor Bailey 74 y.o. male returns to the clinic today for follow-up visit.  The patient is feeling fine today with no concerning complaints except for mild fatigue.  He also has shortness of breath with exertion but no significant chest pain, cough or  hemoptysis.  He has no nausea, vomiting, diarrhea or constipation.  He has no headache or visual changes.  He completed a course of palliative radiotherapy to the right obturator externus and abductor magnus muscle under the care of Dr. [name].  He denied having any headache or visual changes.  He has no significant weight loss or night sweats.  He has been tolerating his treatment with Alimta and Keytruda fairly well.  The patient has repeat CT scan of the chest, abdomen pelvis performed recently and is here for evaluation and discussion of his discuss results.   MEDICAL HISTORY: Past Medical History:  Diagnosis Date  . Adenopathy    left supraclavicular lymph node  . Allergies   . COPD (chronic obstructive pulmonary disease) ([place])   . Coronary disease   . Diabetes ([place])   . Enlarged prostate   . GERD (gastroesophageal reflux disease)   . Glaucoma   . Hyperlipidemia   . Hypertension   . Myocardial infarction ([place])   . nscl ca dx'd 12/2018  . Sleep apnea    wears CPAP  . Wears partial dentures    upper and lower    ALLERGIES:  has No Known Allergies.  MEDICATIONS:  Current Outpatient Medications  Medication Sig Dispense Refill  . ACCU-CHEK GUIDE test strip     . amLODipine (NORVASC) 5 MG tablet TAKE 1 TABLET BY MOUTH  DAILY 90 tablet 3  . aspirin EC 81 MG tablet Take 81 mg by mouth daily.    Marland Kitchen atorvastatin (LIPITOR) 80 MG tablet Take 80 mg by mouth daily.    Marland Kitchen augmented betamethasone dipropionate (DIPROLENE-AF) 0.05 % cream     .  CLARITIN 10 MG tablet Take 10 mg by mouth daily.     . clopidogrel (PLAVIX) 75 MG tablet Take 1 tablet (75 mg total) by mouth every evening. 30 tablet 0  . ezetimibe (ZETIA) 10 MG tablet Take 10 mg by mouth daily.    . finasteride (PROSCAR) 5 MG tablet Take 5 mg by mouth daily.     . folic acid (FOLVITE) 1 MG tablet TAKE 1 TABLET BY MOUTH  DAILY 90 tablet 3  . hydrochlorothiazide (HYDRODIURIL) 25 MG tablet Take 25 mg by mouth daily.     Marland Kitchen latanoprost  (XALATAN) 0.005 % ophthalmic solution Place 1 drop into both eyes at bedtime.     . metFORMIN (GLUCOPHAGE) 1000 MG tablet Take 1,000 mg by mouth 2 (two) times daily.     . metoprolol tartrate (LOPRESSOR) 25 MG tablet Take 25 mg 2 (two) times daily by mouth.    . mirtazapine (REMERON) 30 MG tablet TAKE 1 TABLET BY MOUTH AT  BEDTIME 90 tablet 3  . Multiple Vitamin (MULTIVITAMIN WITH MINERALS) TABS tablet Take 1 tablet by mouth daily. Centrum Silver    . nitroGLYCERIN (NITROSTAT) 0.4 MG SL tablet Place 1 tablet (0.4 mg total) under the tongue every 5 (five) minutes as needed for chest pain. 25 tablet 6  . ofloxacin (FLOXIN) 0.3 % OTIC solution     . OVER THE COUNTER MEDICATION Take 1 tablet by mouth daily as needed. Colace    . pantoprazole (PROTONIX) 40 MG tablet Take 40 mg daily by mouth.    Vladimir Faster Glycol-Propyl Glycol (SYSTANE) 0.4-0.3 % SOLN Place 1-2 drops into both eyes 3 (three) times daily as needed (dry/irritated eyes.).    Marland Kitchen prochlorperazine (COMPAZINE) 10 MG tablet Take 10 mg by mouth daily as needed for nausea or vomiting.    . ramipril (ALTACE) 10 MG capsule Take 10 mg by mouth 2 (two) times daily.      No current facility-administered medications for this visit.    SURGICAL HISTORY:  Past Surgical History:  Procedure Laterality Date  . ABDOMINAL AORTAGRAM Left 09/16/2013   Procedure: ABDOMINAL Maxcine Ham;  Surgeon: Elam Dutch, MD;  Location: [facility] CATH LAB;  Service: Cardiovascular;  Laterality: Left;  . CARDIAC CATHETERIZATION  01/2011   When there was segmental stenosis of the distal RCA, patent PCA stent, patent circumflex stent, and a patent small diagonal with 90% ISR.   Marland Kitchen COLONOSCOPY W/ BIOPSIES AND POLYPECTOMY    . CORONARY ANGIOPLASTY  may 2002   Non-DES stenting of his circumflex, non-DES stenting of the RCA  . HYDROCELE EXCISION / REPAIR  2009   by Dr Janice Norrie  . INGUINAL HERNIA REPAIR Bilateral    Dr Bubba Camp  . LYMPH NODE BIOPSY Left 01/10/2019   Procedure: left  supraclavicular LYMPH NODE BIOPSY;  Surgeon: [name], MD;  Location: [facility];  Service: Thoracic;  Laterality: Left;  Marland Kitchen MULTIPLE TOOTH EXTRACTIONS    . NM MYOCAR PERF WALL MOTION  01/08/2011   moderate in size and intensity area of reversible ischemia in the basal to mid inferior and septal territories. Abnormal study  . stents  2008   proximal RCA, DES for progession of disease.  Marland Kitchen US ECHOCARDIOGRAPHY  01/12/2012   mild concentric LVH, borderline LA enlargement, mild to mod TR    REVIEW OF SYSTEMS:  Constitutional: positive for fatigue Eyes: negative Ears, nose, mouth, throat, and face: negative Respiratory: positive for cough and dyspnea on exertion Cardiovascular: negative Gastrointestinal: negative Genitourinary:negative Integument/breast:  negative Hematologic/lymphatic: negative Musculoskeletal:positive for bone pain Neurological: negative Behavioral/Psych: negative Endocrine: negative Allergic/Immunologic: negative   PHYSICAL EXAMINATION: General appearance: alert, cooperative and no distress Head: Normocephalic, without obvious abnormality, atraumatic Neck: no adenopathy, no JVD, supple, symmetrical, trachea midline and thyroid not enlarged, symmetric, no tenderness/mass/nodules Lymph nodes: Cervical, supraclavicular, and axillary nodes normal. Resp: clear to auscultation bilaterally Back: symmetric, no curvature. ROM normal. No CVA tenderness. Cardio: regular rate and rhythm, S1, S2 normal, no murmur, click, rub or gallop GI: soft, non-tender; bowel sounds normal; no masses,  no organomegaly Extremities: extremities normal, atraumatic, no cyanosis or edema Neurologic: Alert and oriented X 3, normal strength and tone. Normal symmetric reflexes. Normal coordination and gait  ECOG PERFORMANCE STATUS: 1 - Symptomatic but completely ambulatory  Blood pressure 97/77, pulse 78, temperature (!) 97.1 F (36.2 C), temperature source Tympanic, resp. rate 18, height $RemoveBe'5\' 9"'LfPSCPBQw$   (1.753 m), weight 178 lb (80.7 kg), SpO2 100 %.  LABORATORY DATA: Lab Results  Component Value Date   WBC 4.5 02/29/2020   HGB 10.1 (L) 02/29/2020   HCT 30.2 (L) 02/29/2020   MCV 103.4 (H) 02/29/2020   PLT 234 02/29/2020      Chemistry      Component Value Date/Time   NA 137 02/29/2020 1128   K 4.8 02/29/2020 1128   CL 105 02/29/2020 1128   CO2 26 02/29/2020 1128   BUN 27 (H) 02/29/2020 1128   CREATININE 1.62 (H) 02/29/2020 1128   CREATININE 0.94 05/16/2014 1109      Component Value Date/Time   CALCIUM 10.1 02/29/2020 1128   ALKPHOS 107 02/29/2020 1128   AST 26 02/29/2020 1128   ALT 27 02/29/2020 1128   BILITOT 0.3 02/29/2020 1128       RADIOGRAPHIC STUDIES: CT Abdomen Pelvis Wo Contrast  Result Date: 02/28/2020 CLINICAL DATA:  Primary Cancer Type: Lung Imaging Indication: Routine surveillance Interval therapy since last imaging? Yes Initial Cancer Diagnosis Date: 01/10/2019; Established by: Biopsy-proven Detailed Pathology: Stage IV non-small cell lung cancer, adenocarcinoma. Primary Tumor location: Left upper lobe. Metastases to right adrenal gland and right thigh adductor muscle. Surgeries: Coronary angioplasty.  Inguinal hernia repair, bilateral. Chemotherapy: Yes; Ongoing? Yes; Most recent administration: 01/18/2020 Immunotherapy?  Yes; Type: Keytruda; Ongoing? Yes Radiation therapy? Yes Date Range: 01/30/2020 - 02/17/2020; Target: Right hip/pelvis Date Range: 03/28/2019 - 04/07/2019; Target: Right adrenal gland Date Range: 01/31/2019 - 03/16/2019; Target: Left lung EXAM: CT CHEST, ABDOMEN AND PELVIS WITHOUT CONTRAST TECHNIQUE: Multidetector CT imaging of the chest, abdomen and pelvis was performed following the standard protocol without IV contrast. COMPARISON:  Most recent CT chest, abdomen and pelvis 12/26/2019. 12/06/2018 PET-CT. FINDINGS: CT CHEST FINDINGS Cardiovascular: Coronary, aortic arch, and branch vessel atherosclerotic vascular disease. Mediastinum/Nodes:  Subcarinal lymph node 1.2 cm in short axis on image 31 of series 2, stable. Lungs/Pleura: The trace left pleural effusion has essentially resolved. Stable small pleural calcification on the left on image 39 of series 2. Centrilobular emphysema. Old granulomatous disease. Airway thickening is present, suggesting bronchitis or reactive airways disease. Mildly increased confluent bandlike densities in the left lung apex compared to previous, with an increase in volume loss a previously described area of nodularity at the left lung apex currently measures 0.7 by 1.0 cm on image 28 of series 4, previously 0.8 by 1.0 cm by my measurements (essentially stable). Musculoskeletal: Potential free osteochondral fragments in the left glenohumeral joint. Asymmetric degenerative right sternoclavicular arthropathy. CT ABDOMEN PELVIS FINDINGS Hepatobiliary: Scattered hypodense hepatic lesions are similar to prior  and favor cysts. Gallbladder unremarkable. Pancreas: Unremarkable Spleen: Unremarkable Adrenals/Urinary Tract: Adrenal glands unremarkable. Unchanged right renal cyst posteriorly. The prostate gland indents the bladder base. Stomach/Bowel: Mild descending and sigmoid colon diverticulosis. Vascular/Lymphatic: Aortoiliac atherosclerotic vascular disease. No pathologic adenopathy identified. Reproductive: Prostatomegaly. Other: No supplemental non-categorized findings. Musculoskeletal: Hypodense right adductor magnus mass. The hypodense muscle measures 3.9 cm in thickness on image 123/2 compared to the contralateral side measuring 2.1 cm in thickness. 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 Lumbar spondylosis and degenerative disc disease causing multilevel impingement. Mild dextroconvex lumbar scoliosis, with rotary component. IMPRESSION: 1. Stable size of the previously described left apical  nodule. Mildly increased confluent bandlike densities in the left lung apex compared to previous, with an increase in volume loss. 2. Airway thickening is present, suggesting bronchitis or reactive airways disease. 3. Hypodense right adductor magnus mass compatible with metastatic lesion 4. 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 5. Other imaging findings of potential clinical significance: Coronary atherosclerosis. Old granulomatous disease. Hepatic and right renal cysts. Mild descending and sigmoid colon diverticulosis. Prostatomegaly. Lumbar spondylosis and degenerative disc disease causing multilevel impingement. Potential free osteochondral fragments in the left glenohumeral joint. 6. Emphysema and aortic atherosclerosis. Aortic Atherosclerosis (ICD10-I70.0) and Emphysema (ICD10-J43.9). Electronically Signed   By: Van Clines M.D.   On: 02/28/2020 10:21   CT Chest Wo Contrast  Result Date: 02/28/2020 CLINICAL DATA:  Primary Cancer Type: Lung Imaging Indication: Routine surveillance Interval therapy since last imaging? Yes Initial Cancer Diagnosis Date: 01/10/2019; Established by: Biopsy-proven Detailed Pathology: Stage IV non-small cell lung cancer, adenocarcinoma. Primary Tumor location: Left upper lobe. Metastases to right adrenal gland and right thigh adductor muscle. Surgeries: Coronary angioplasty.  Inguinal hernia repair, bilateral. Chemotherapy: Yes; Ongoing? Yes; Most recent administration: 01/18/2020 Immunotherapy?  Yes; Type: Keytruda; Ongoing? Yes Radiation therapy? Yes Date Range: 01/30/2020 - 02/17/2020; Target: Right hip/pelvis Date Range: 03/28/2019 - 04/07/2019; Target: Right adrenal gland Date Range: 01/31/2019 - 03/16/2019; Target: Left lung EXAM: CT CHEST, ABDOMEN AND PELVIS WITHOUT CONTRAST TECHNIQUE: Multidetector CT imaging of the chest, abdomen  and pelvis was performed following the standard protocol without IV contrast. COMPARISON:  Most recent CT chest, abdomen and pelvis 12/26/2019. 12/06/2018 PET-CT. FINDINGS: CT CHEST FINDINGS Cardiovascular: Coronary, aortic arch, and branch vessel atherosclerotic vascular disease. Mediastinum/Nodes: Subcarinal lymph node 1.2 cm in short axis on image 31 of series 2, stable. Lungs/Pleura: The trace left pleural effusion has essentially resolved. Stable small pleural calcification on the left on image 39 of series 2. Centrilobular emphysema. Old granulomatous disease. Airway thickening is present, suggesting bronchitis or reactive airways disease. Mildly increased confluent bandlike densities in the left lung apex compared to previous, with an increase in volume loss a previously described area of nodularity at the left lung apex currently measures 0.7 by 1.0 cm on image 28 of series 4, previously 0.8 by 1.0 cm by my measurements (essentially stable). Musculoskeletal: Potential free osteochondral fragments in the left glenohumeral joint. Asymmetric degenerative right sternoclavicular arthropathy. CT ABDOMEN PELVIS FINDINGS Hepatobiliary: Scattered hypodense hepatic lesions are similar to prior and favor cysts. Gallbladder unremarkable. Pancreas: Unremarkable Spleen: Unremarkable Adrenals/Urinary Tract: Adrenal glands unremarkable. Unchanged right renal cyst posteriorly. The prostate gland indents the bladder base. Stomach/Bowel: Mild descending and sigmoid colon diverticulosis. Vascular/Lymphatic: Aortoiliac atherosclerotic vascular disease. No  pathologic adenopathy identified. Reproductive: Prostatomegaly. Other: No supplemental non-categorized findings. Musculoskeletal: Hypodense right adductor magnus mass. The hypodense muscle measures 3.9 cm in thickness on image 123/2 compared to the contralateral side measuring 2.1 cm in thickness. Heterogeneous densities in the L2 vertebral body and posterior elements, this  previously only have low-grade metabolic activity and may well represent Paget's disease of bone. Lumbar spondylosis and degenerative disc disease causing multilevel impingement. Mild dextroconvex lumbar scoliosis, with rotary component. IMPRESSION: 1. Stable size of the previously described left apical nodule. Mildly increased confluent bandlike densities in the left lung apex compared to previous, with an increase in volume loss. 2. Airway thickening is present, suggesting bronchitis or reactive airways disease. 3. Hypodense right adductor magnus mass compatible with metastatic lesion 4. 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 5. Other imaging findings of potential clinical significance: Coronary atherosclerosis. Old granulomatous disease. Hepatic and right renal cysts. Mild descending and sigmoid colon diverticulosis. Prostatomegaly. Lumbar spondylosis and degenerative disc disease causing multilevel impingement. Potential free osteochondral fragments in the left glenohumeral joint. 6. Emphysema and aortic atherosclerosis. Aortic Atherosclerosis (ICD10-I70.0) and Emphysema (ICD10-J43.9). Electronically Signed   By: Van Clines M.D.   On: 02/28/2020 10:21    ASSESSMENT AND PLAN: This is a very pleasant 74 years old African-American male with stage IV non-small cell lung cancer, adenocarcinoma with no actionable mutation and PD-L1 expression of 30%, presented with locally advanced disease in the chest as well as solitary metastasis to the right adrenal gland. The patient completed a course of concurrent chemoradiation to the chest with weekly carboplatin for AUC of 2 and paclitaxel 45 mg/M2 status post 7 cycles..  The patient had evidence for disease progression and he was started on systemic chemotherapy with carboplatin, Alimta and Keytruda for 4 cycles and he is  currently on maintenance treatment with Alimta and Keytruda every 3 weeks status post 11 more cycles. The patient has been tolerating this treatment well with no concerning adverse effects. He had repeat CT scan of the chest, abdomen pelvis performed recently.  I personally and independently reviewed the scans and discussed the results with the patient today. His scan showed no concerning findings for disease progression. I recommended for him to continue his current treatment with maintenance therapy but only with single agent Keytruda today because of the renal insufficiency. The patient will come back for follow-up visit in 3 weeks for evaluation before the next cycle of his treatment. For the metastatic muscular lesion The patient underwent palliative radiotherapy to the right femur area. For the lack of appetite he will continue his current treatment with Remeron 30 mg p.o. nightly. The patient was advised to call immediately if he has any concerning symptoms in the interval.  The patient voices understanding of current disease status and treatment options and is in agreement with the current care plan. All questions were answered. The patient knows to call the clinic with any problems, questions or concerns. We can certainly see the patient much sooner if necessary.   Disclaimer: This note was dictated with voice recognition software. Similar sounding words can inadvertently be transcribed and may not be corrected upon review.

## 2020-02-29 NOTE — Patient Instructions (Signed)
[facility] Discharge Instructions for Patients Receiving Chemotherapy  Today you received the following chemotherapy agents Pembrolizumab [facility]).  To help prevent nausea and vomiting after your treatment, we encourage you to take your nausea medication as prescribed.   If you develop nausea and vomiting that is not controlled by your nausea medication, call the clinic.   BELOW ARE SYMPTOMS THAT SHOULD BE REPORTED IMMEDIATELY:  *FEVER GREATER THAN 100.5 F  *CHILLS WITH OR WITHOUT FEVER  NAUSEA AND VOMITING THAT IS NOT CONTROLLED WITH YOUR NAUSEA MEDICATION  *UNUSUAL SHORTNESS OF BREATH  *UNUSUAL BRUISING OR BLEEDING  TENDERNESS IN MOUTH AND THROAT WITH OR WITHOUT PRESENCE OF ULCERS  *URINARY PROBLEMS  *BOWEL PROBLEMS  UNUSUAL RASH Items with * indicate a potential emergency and should be followed up as soon as possible.  Feel free to call the clinic should you have any questions or concerns. The clinic phone number is [phone].  Please show the [place] at check-in to the Emergency Department and triage nurse.

## 2020-03-01 ENCOUNTER — Telehealth: Payer: Self-pay | Admitting: Internal Medicine

## 2020-03-01 NOTE — Telephone Encounter (Signed)
Scheduled appt per 10/13 los - pt aware of next appt scheduled on 11/3

## 2020-03-07 ENCOUNTER — Telehealth: Payer: Self-pay | Admitting: Radiation Oncology

## 2020-03-07 NOTE — Telephone Encounter (Signed)
  Radiation Oncology         [phone] ________________________________  Name: Trevor Bailey MRN: [id number]  Date of Service: 03/07/2020  DOB: 06-Feb-1946  Post Treatment Telephone Note  Diagnosis:   Progressive metastatic Stage IIIB/IV, T1bN3M0/M1c NSCLC, adenocarcinoma of the LUL with left supraclavicular disease, and oligometastatic right adrenal disease and now muscular metastatic disease.  Interval Since Last Radiation:  3 weeks   01/30/20-02/17/20: The right pelvic mass in the right obturator externus and adductor magnus was treated to 60 Gy in 15 fractions.   03/28/2019-04/07/2019 SBRT Treatment: The right adrenal gland target was treated to 50 Gy in 5 fractions.   01/31/19 - 03/16/19: The patient was treated to the disease within the leftlung including his left supraclavicular nodes initially to a dose of 60 Gy using a 109field, 3-D conformal technique. The patient then received a cone down boost treatment for an additional 6 Gy. This yielded a final total dose of 66 Gy.   Narrative:  The patient was contacted today for routine follow-up. During treatment he did very well with radiotherapy and did not have significant desquamation. He continues with systemic Alimta/Keytruda with Dr. Julien Nordmann.  Impression/Plan: 1. 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 The patient has been doing well since completion of radiotherapy according to medical oncology notes. I was unable to reach him today but I left a voicemail and on it, discussed that we would be happy to continue to follow him as needed, but he will also continue to follow up with Dr. Julien Nordmann in medical oncology.     Carola Rhine, PAC

## 2020-03-16 NOTE — Progress Notes (Signed)
[facility] OFFICE PROGRESS NOTE  Trevor Dills, MD [address] [place] [address]  DIAGNOSIS: Stage IV (T1b, N3,M1c) non-small cell lung cancer, adenocarcinoma. 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  PRIOR THERAPY: 1) Weekly concurrent chemoradiation with carboplatin for an AUC of 2 and paclitaxel 45 mg/m2 to the locally advanced disease in the chest. He started the first dose 01/31/2019. Status post7cycles.Last dose of chemotherapy was given on March 14, 2019. 2) palliative radiotherapy to the metastatic disease in the right adrenal gland. 3) Palliative radiotherapy to the enlarging muscular metastasis in the right hip  within the right adductor muscle, medial to the lesser trochanter of the femur under the care of Dr. Mitzi Hansen. First day of radiation 01/30/20  CURRENT THERAPY: 1) Systemic chemotherapy with carboplatin for AUC of 5, Alimta 500 mg/M2 and Keytruda 200 mg IV every 3 weeks. First dose April 20, 2019. Status post16cycles.Starting from cycle #5, the patient began maintenance Alimta and Keytruda.  INTERVAL HISTORY: Trevor Bailey 74 y.o. male returns to the clinic for a follow up visit. The patient is feeling well today without any concerning complaints. Last month, he completed palliative radiation to the enlarging muscular metastasis. His pain is improved at this time. The patient continues to tolerate treatment with alimta and keytrudawell without any adverse sideeffects. His Alimta was held with his last cycle of treatment due to renal insufficiency. Denies any fever, chills, or night sweats. His weight is stable since his last appointment but he reports that food does not taste good. He was evaluated by a member of the nutritionist team recently.  Denies thrush. Denies any  chest pain, shortness of breath, or hemoptysis. He reports a little bit of a dry cough "every now and then when I am trying to clear my throat". Denies any nausea, vomiting, diarrhea, or constipation. Denies any headache or visual changes. Denies any rashes or skin changes. The patient is here today for evaluation prior to starting cycle # 17    MEDICAL HISTORY: Past Medical History:  Diagnosis Date  . Adenopathy    left supraclavicular lymph node  . Allergies   . COPD (chronic obstructive pulmonary disease) (HCC)   . Coronary disease   . Diabetes (HCC)   . Enlarged prostate   . GERD (gastroesophageal reflux disease)   . Glaucoma   . Hyperlipidemia   . Hypertension   . Myocardial infarction (HCC)   . nscl ca dx'd 12/2018  . Sleep apnea    wears CPAP  . Wears partial dentures    upper and lower    ALLERGIES:  has No Known Allergies.  MEDICATIONS:  Current Outpatient Medications  Medication Sig Dispense Refill  . ACCU-CHEK GUIDE test strip     . amLODipine (NORVASC) 5 MG tablet TAKE 1 TABLET BY MOUTH  DAILY 90 tablet 3  . aspirin EC 81 MG tablet Take 81 mg by mouth daily.    Marland Kitchen atorvastatin (LIPITOR) 80 MG tablet Take 80 mg by mouth daily.    Marland Kitchen augmented betamethasone dipropionate (DIPROLENE-AF) 0.05 % cream     . CLARITIN 10 MG tablet Take 10 mg by mouth daily.     . clopidogrel (PLAVIX) 75 MG tablet Take 1 tablet (75 mg total) by mouth every evening. 30 tablet 0  . ezetimibe (ZETIA) 10 MG tablet Take  10 mg by mouth daily.    . finasteride (PROSCAR) 5 MG tablet Take 5 mg by mouth daily.     . folic acid (FOLVITE) 1 MG tablet TAKE 1 TABLET BY MOUTH  DAILY 90 tablet 3  . hydrochlorothiazide (HYDRODIURIL) 25 MG tablet Take 25 mg by mouth daily.     Marland Kitchen latanoprost (XALATAN) 0.005 % ophthalmic solution Place 1 drop into both eyes at bedtime.     . metFORMIN (GLUCOPHAGE) 1000 MG tablet Take 1,000 mg by mouth 2 (two) times daily.     . metoprolol tartrate (LOPRESSOR) 25 MG tablet Take  25 mg 2 (two) times daily by mouth.    . mirtazapine (REMERON) 30 MG tablet TAKE 1 TABLET BY MOUTH AT  BEDTIME 90 tablet 3  . Multiple Vitamin (MULTIVITAMIN WITH MINERALS) TABS tablet Take 1 tablet by mouth daily. Centrum Silver    . nitroGLYCERIN (NITROSTAT) 0.4 MG SL tablet Place 1 tablet (0.4 mg total) under the tongue every 5 (five) minutes as needed for chest pain. 25 tablet 6  . ofloxacin (FLOXIN) 0.3 % OTIC solution     . OVER THE COUNTER MEDICATION Take 1 tablet by mouth daily as needed. Colace    . pantoprazole (PROTONIX) 40 MG tablet Take 40 mg daily by mouth.    Vladimir Faster Glycol-Propyl Glycol (SYSTANE) 0.4-0.3 % SOLN Place 1-2 drops into both eyes 3 (three) times daily as needed (dry/irritated eyes.).    Marland Kitchen prochlorperazine (COMPAZINE) 10 MG tablet Take 10 mg by mouth daily as needed for nausea or vomiting.    . ramipril (ALTACE) 10 MG capsule Take 10 mg by mouth 2 (two) times daily.      No current facility-administered medications for this visit.    SURGICAL HISTORY:  Past Surgical History:  Procedure Laterality Date  . ABDOMINAL AORTAGRAM Left 09/16/2013   Procedure: ABDOMINAL Maxcine Ham;  Surgeon: Elam Dutch, MD;  Location: [facility] CATH LAB;  Service: Cardiovascular;  Laterality: Left;  . CARDIAC CATHETERIZATION  01/2011   When there was segmental stenosis of the distal RCA, patent PCA stent, patent circumflex stent, and a patent small diagonal with 90% ISR.   Marland Kitchen COLONOSCOPY W/ BIOPSIES AND POLYPECTOMY    . CORONARY ANGIOPLASTY  may 2002   Non-DES stenting of his circumflex, non-DES stenting of the RCA  . HYDROCELE EXCISION / REPAIR  2009   by Dr Janice Norrie  . INGUINAL HERNIA REPAIR Bilateral    Dr Bubba Camp  . LYMPH NODE BIOPSY Left 01/10/2019   Procedure: left supraclavicular LYMPH NODE BIOPSY;  Surgeon: [name], MD;  Location: [facility];  Service: Thoracic;  Laterality: Left;  Marland Kitchen MULTIPLE TOOTH EXTRACTIONS    . NM MYOCAR PERF WALL MOTION  01/08/2011   moderate in size and  intensity area of reversible ischemia in the basal to mid inferior and septal territories. Abnormal study  . stents  2008   proximal RCA, DES for progession of disease.  Marland Kitchen US ECHOCARDIOGRAPHY  01/12/2012   mild concentric LVH, borderline LA enlargement, mild to mod TR    REVIEW OF SYSTEMS:   Review of Systems  Constitutional: Positive for decreased appetite secondary to taste changes. Negative for chills, fatigue, fever and unexpected weight change.  HENT:Negative for mouth sores, nosebleeds, sore throat and trouble swallowing.  Eyes: Negative for eye problems and icterus.  Respiratory: Negative for cough, hemoptysis, shortness of breath and wheezing.  Cardiovascular: Negative for chest pain and leg swelling.  Gastrointestinal: Negative for abdominal pain,  constipation, diarrhea, nausea and vomiting.  Genitourinary: Negative for bladder incontinence, difficulty urinating, dysuria, frequency and hematuria.  Musculoskeletal: Negative for back pain, gait problem, neck pain and neck stiffness.  Skin: Negative for itching and rash.  Neurological: Negative for dizziness, extremity weakness, gait problem, headaches, light-headedness and seizures.  Hematological: Negative for adenopathy. Does not bruise/bleed easily.  Psychiatric/Behavioral: Negative for confusion, depression and sleep disturbance. The patient is not nervous/anxious.     PHYSICAL EXAMINATION:  Blood pressure 110/69, pulse 74, temperature (!) 97.2 F (36.2 C), temperature source Tympanic, resp. rate 18, height 5\' 9"  (1.753 m), weight 179 lb 8 oz (81.4 kg), SpO2 100 %.  ECOG PERFORMANCE STATUS: 1 - Symptomatic but completely ambulatory  Physical Exam  Constitutional: Oriented to person, place, and time and well-developed, well-nourished, and in no distress.  HENT:  Head: Normocephalic and atraumatic.  Mouth/Throat: Oropharynx is clear and moist. No oropharyngeal exudate.  Eyes: Conjunctivae are normal. Right eye exhibits  no discharge. Left eye exhibits no discharge. No scleral icterus.  Neck: Normal range of motion. Neck supple.  Cardiovascular: Normal rate, regular rhythm, normal heart sounds and intact distal pulses.  Pulmonary/Chest: Effort normal and breath sounds normal. No respiratory distress. No wheezes. No rales.  Abdominal: Soft. Bowel sounds are normal. Exhibits no distension and no mass. There is no tenderness.  Musculoskeletal: Normal range of motion. Exhibits no edema.  Lymphadenopathy:  No cervical adenopathy.  Neurological: Alert and oriented to person, place, and time. Exhibits normal muscle tone. Gait normal. Coordination normal.  Skin: Skin is warm and dry. No rash noted. Not diaphoretic. No erythema. No pallor.  Psychiatric: Mood, memory and judgment normal.  Vitals reviewed.  LABORATORY DATA: Lab Results  Component Value Date   WBC 3.4 (L) 03/20/2020   HGB 10.5 (L) 03/20/2020   HCT 32.5 (L) 03/20/2020   MCV 105.2 (H) 03/20/2020   PLT 218 03/20/2020      Chemistry      Component Value Date/Time   NA 139 03/20/2020 1040   K 4.0 03/20/2020 1040   CL 106 03/20/2020 1040   CO2 26 03/20/2020 1040   BUN 19 03/20/2020 1040   CREATININE 1.46 (H) 03/20/2020 1040   CREATININE 0.94 05/16/2014 1109      Component Value Date/Time   CALCIUM 9.5 03/20/2020 1040   ALKPHOS 98 03/20/2020 1040   AST 24 03/20/2020 1040   ALT 29 03/20/2020 1040   BILITOT 0.3 03/20/2020 1040       RADIOGRAPHIC STUDIES:  CT Abdomen Pelvis Wo Contrast  Result Date: 02/28/2020 CLINICAL DATA:  Primary Cancer Type: Lung Imaging Indication: Routine surveillance Interval therapy since last imaging? Yes Initial Cancer Diagnosis Date: 01/10/2019; Established by: Biopsy-proven Detailed Pathology: Stage IV non-small cell lung cancer, adenocarcinoma. Primary Tumor location: Left upper lobe. Metastases to right adrenal gland and right thigh adductor muscle. Surgeries: Coronary angioplasty.  Inguinal hernia  repair, bilateral. Chemotherapy: Yes; Ongoing? Yes; Most recent administration: 01/18/2020 Immunotherapy?  Yes; Type: Keytruda; Ongoing? Yes Radiation therapy? Yes Date Range: 01/30/2020 - 02/17/2020; Target: Right hip/pelvis Date Range: 03/28/2019 - 04/07/2019; Target: Right adrenal gland Date Range: 01/31/2019 - 03/16/2019; Target: Left lung EXAM: CT CHEST, ABDOMEN AND PELVIS WITHOUT CONTRAST TECHNIQUE: Multidetector CT imaging of the chest, abdomen and pelvis was performed following the standard protocol without IV contrast. COMPARISON:  Most recent CT chest, abdomen and pelvis 12/26/2019. 12/06/2018 PET-CT. FINDINGS: CT CHEST FINDINGS Cardiovascular: Coronary, aortic arch, and branch vessel atherosclerotic vascular disease. Mediastinum/Nodes: Subcarinal lymph node 1.2 cm  in short axis on image 31 of series 2, stable. Lungs/Pleura: The trace left pleural effusion has essentially resolved. Stable small pleural calcification on the left on image 39 of series 2. Centrilobular emphysema. Old granulomatous disease. Airway thickening is present, suggesting bronchitis or reactive airways disease. Mildly increased confluent bandlike densities in the left lung apex compared to previous, with an increase in volume loss a previously described area of nodularity at the left lung apex currently measures 0.7 by 1.0 cm on image 28 of series 4, previously 0.8 by 1.0 cm by my measurements (essentially stable). Musculoskeletal: Potential free osteochondral fragments in the left glenohumeral joint. Asymmetric degenerative right sternoclavicular arthropathy. CT ABDOMEN PELVIS FINDINGS Hepatobiliary: Scattered hypodense hepatic lesions are similar to prior and favor cysts. Gallbladder unremarkable. Pancreas: Unremarkable Spleen: Unremarkable Adrenals/Urinary Tract: Adrenal glands unremarkable. Unchanged right renal cyst posteriorly. The prostate gland indents the bladder base. Stomach/Bowel: Mild descending and sigmoid colon  diverticulosis. Vascular/Lymphatic: Aortoiliac atherosclerotic vascular disease. No pathologic adenopathy identified. Reproductive: Prostatomegaly. Other: No supplemental non-categorized findings. Musculoskeletal: Hypodense right adductor magnus mass. The hypodense muscle measures 3.9 cm in thickness on image 123/2 compared to the contralateral side measuring 2.1 cm in thickness. 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 Lumbar spondylosis and degenerative disc disease causing multilevel impingement. Mild dextroconvex lumbar scoliosis, with rotary component. IMPRESSION: 1. Stable size of the previously described left apical nodule. Mildly increased confluent bandlike densities in the left lung apex compared to previous, with an increase in volume loss. 2. Airway thickening is present, suggesting bronchitis or reactive airways disease. 3. Hypodense right adductor magnus mass compatible with metastatic lesion 4. 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 5. Other imaging findings of potential clinical significance: Coronary atherosclerosis. Old granulomatous disease. Hepatic and right renal cysts. Mild descending and sigmoid colon diverticulosis. Prostatomegaly. Lumbar spondylosis and degenerative disc disease causing multilevel impingement. Potential free osteochondral fragments in the left glenohumeral joint. 6. Emphysema and aortic atherosclerosis. Aortic Atherosclerosis (ICD10-I70.0) and Emphysema (ICD10-J43.9). Electronically Signed   By: Van Clines M.D.   On: 02/28/2020 10:21   CT Chest Wo Contrast  Result Date: 02/28/2020 CLINICAL DATA:  Primary Cancer Type: Lung Imaging Indication: Routine surveillance Interval therapy since last  imaging? Yes Initial Cancer Diagnosis Date: 01/10/2019; Established by: Biopsy-proven Detailed Pathology: Stage IV non-small cell lung cancer, adenocarcinoma. Primary Tumor location: Left upper lobe. Metastases to right adrenal gland and right thigh adductor muscle. Surgeries: Coronary angioplasty.  Inguinal hernia repair, bilateral. Chemotherapy: Yes; Ongoing? Yes; Most recent administration: 01/18/2020 Immunotherapy?  Yes; Type: Keytruda; Ongoing? Yes Radiation therapy? Yes Date Range: 01/30/2020 - 02/17/2020; Target: Right hip/pelvis Date Range: 03/28/2019 - 04/07/2019; Target: Right adrenal gland Date Range: 01/31/2019 - 03/16/2019; Target: Left lung EXAM: CT CHEST, ABDOMEN AND PELVIS WITHOUT CONTRAST TECHNIQUE: Multidetector CT imaging of the chest, abdomen and pelvis was performed following the standard protocol without IV contrast. COMPARISON:  Most recent CT chest, abdomen and pelvis 12/26/2019. 12/06/2018 PET-CT. FINDINGS: CT CHEST FINDINGS Cardiovascular: Coronary, aortic arch, and branch vessel atherosclerotic vascular disease. Mediastinum/Nodes: Subcarinal lymph node 1.2 cm in short axis on image 31 of series 2, stable. Lungs/Pleura: The trace left pleural effusion has essentially resolved. Stable small pleural calcification on the left on image 39 of series 2. Centrilobular emphysema. Old granulomatous disease. Airway thickening  is present, suggesting bronchitis or reactive airways disease. Mildly increased confluent bandlike densities in the left lung apex compared to previous, with an increase in volume loss a previously described area of nodularity at the left lung apex currently measures 0.7 by 1.0 cm on image 28 of series 4, previously 0.8 by 1.0 cm by my measurements (essentially stable). Musculoskeletal: Potential free osteochondral fragments in the left glenohumeral joint. Asymmetric degenerative right sternoclavicular arthropathy. CT ABDOMEN PELVIS FINDINGS Hepatobiliary: Scattered hypodense  hepatic lesions are similar to prior and favor cysts. Gallbladder unremarkable. Pancreas: Unremarkable Spleen: Unremarkable Adrenals/Urinary Tract: Adrenal glands unremarkable. Unchanged right renal cyst posteriorly. The prostate gland indents the bladder base. Stomach/Bowel: Mild descending and sigmoid colon diverticulosis. Vascular/Lymphatic: Aortoiliac atherosclerotic vascular disease. No pathologic adenopathy identified. Reproductive: Prostatomegaly. Other: No supplemental non-categorized findings. Musculoskeletal: Hypodense right adductor magnus mass. The hypodense muscle measures 3.9 cm in thickness on image 123/2 compared to the contralateral side measuring 2.1 cm in thickness. 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 Lumbar spondylosis and degenerative disc disease causing multilevel impingement. Mild dextroconvex lumbar scoliosis, with rotary component. IMPRESSION: 1. Stable size of the previously described left apical nodule. Mildly increased confluent bandlike densities in the left lung apex compared to previous, with an increase in volume loss. 2. Airway thickening is present, suggesting bronchitis or reactive airways disease. 3. Hypodense right adductor magnus mass compatible with metastatic lesion 4. 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 5. Other imaging findings of potential clinical significance: Coronary atherosclerosis. Old granulomatous disease. Hepatic and right renal cysts. Mild descending and sigmoid colon diverticulosis. Prostatomegaly. Lumbar spondylosis and degenerative disc disease causing multilevel impingement. Potential free osteochondral fragments in the left glenohumeral joint. 6. Emphysema and aortic atherosclerosis.  Aortic Atherosclerosis (ICD10-I70.0) and Emphysema (ICD10-J43.9). Electronically Signed   By: Van Clines M.D.   On: 02/28/2020 10:21     ASSESSMENT/PLAN:  This is a very pleasant 74 year old African-American male with stage IV non-small cell lung cancer, adenocarcinoma with no actionable mutation and PD-L1 expression of 30%,presented with locally advanced disease in the chest as well as solitary metastasis to the right adrenal gland.He has no actionable mutations.  The patientcompleted a course of concurrent chemoradiation to the chest with weekly carboplatin for AUC of 2 and paclitaxel 45 mg/M2 status post7cycles.Last dose of chemotherapy was given on March 14, 2019. He also completed palliative radiotherapy to the right adrenal gland lesion under the care of Dr. [name].  He also completed palliative radiotherapy to the muscular metastasis in the right hip in September 2021.   He is currentlyundergoing systemic chemotherapy with carboplatin for an AUC of 5, Alimta 500 mg per metered squared and Keytruda 200 mg IV every 3 weeks. He is status post16cycles. He tolerated treatment well without any concerning adverse side effects except for mild fatigue.Starting from cycle #5,he has been on maintenance treatment with Alimta and Keytruda.  Labs were reviewed.I reviewed his labs with Dr. Julien Nordmann. We will reduce his dose of Alimta to 400 mg/m2. Recommend that heproceed with cycle #17 today as scheduled.   We will see him back for a follow up visit in3weeks for evaluation before starting cycle #18.  The patient was advised to call immediately if he has any concerning symptoms in the interval. The patient voices understanding  of current disease status and treatment options and is in agreement with the current care plan. All questions were answered. The patient knows to call the clinic with any problems, questions or concerns. We can certainly see the patient much sooner if  necessary   Orders Placed This Encounter  Procedures  . CBC with Differential ([facility] Only)    Standing Status:   Standing    Number of Occurrences:   20    Standing Expiration Date:   03/20/2021  . CMP ([facility] only)    Standing Status:   Standing    Number of Occurrences:   20    Standing Expiration Date:   03/20/2021     Trevor Dishon L Robi Mitter, PA-C 03/20/20

## 2020-03-20 ENCOUNTER — Inpatient Hospital Stay: Payer: Medicare Other | Admitting: Physician Assistant

## 2020-03-20 ENCOUNTER — Inpatient Hospital Stay: Payer: Medicare Other

## 2020-03-20 ENCOUNTER — Inpatient Hospital Stay: Payer: Medicare Other | Attending: Internal Medicine

## 2020-03-20 ENCOUNTER — Other Ambulatory Visit: Payer: Self-pay

## 2020-03-20 VITALS — BP 110/69 | HR 74 | Temp 97.2°F | Resp 18 | Ht 69.0 in | Wt 179.5 lb

## 2020-03-20 DIAGNOSIS — Z5111 Encounter for antineoplastic chemotherapy: Secondary | ICD-10-CM | POA: Diagnosis not present

## 2020-03-20 DIAGNOSIS — Z5112 Encounter for antineoplastic immunotherapy: Secondary | ICD-10-CM | POA: Diagnosis not present

## 2020-03-20 DIAGNOSIS — C3412 Malignant neoplasm of upper lobe, left bronchus or lung: Secondary | ICD-10-CM

## 2020-03-20 DIAGNOSIS — Z79899 Other long term (current) drug therapy: Secondary | ICD-10-CM | POA: Insufficient documentation

## 2020-03-20 DIAGNOSIS — C7971 Secondary malignant neoplasm of right adrenal gland: Secondary | ICD-10-CM | POA: Insufficient documentation

## 2020-03-20 LAB — CMP (CANCER CENTER ONLY)
ALT: 29 U/L (ref 0–44)
AST: 24 U/L (ref 15–41)
Albumin: 3.2 g/dL — ABNORMAL LOW (ref 3.5–5.0)
Alkaline Phosphatase: 98 U/L (ref 38–126)
Anion gap: 7 (ref 5–15)
BUN: 19 mg/dL (ref 8–23)
CO2: 26 mmol/L (ref 22–32)
Calcium: 9.5 mg/dL (ref 8.9–10.3)
Chloride: 106 mmol/L (ref 98–111)
Creatinine: 1.46 mg/dL — ABNORMAL HIGH (ref 0.61–1.24)
GFR, Estimated: 50 mL/min — ABNORMAL LOW (ref >60)
Glucose, Bld: 183 mg/dL — ABNORMAL HIGH (ref 70–99)
Potassium: 4 mmol/L (ref 3.5–5.1)
Sodium: 139 mmol/L (ref 135–145)
Total Bilirubin: 0.3 mg/dL (ref 0.3–1.2)
Total Protein: 7 g/dL (ref 6.5–8.1)

## 2020-03-20 LAB — CBC WITH DIFFERENTIAL (CANCER CENTER ONLY)
Abs Immature Granulocytes: 0.01 10*3/uL (ref 0.00–0.07)
Basophils Absolute: 0 10*3/uL (ref 0.0–0.1)
Basophils Relative: 1 %
Eosinophils Absolute: 0.1 10*3/uL (ref 0.0–0.5)
Eosinophils Relative: 2 %
HCT: 32.5 % — ABNORMAL LOW (ref 39.0–52.0)
Hemoglobin: 10.5 g/dL — ABNORMAL LOW (ref 13.0–17.0)
Immature Granulocytes: 0 %
Lymphocytes Relative: 19 %
Lymphs Abs: 0.6 10*3/uL — ABNORMAL LOW (ref 0.7–4.0)
MCH: 34 pg (ref 26.0–34.0)
MCHC: 32.3 g/dL (ref 30.0–36.0)
MCV: 105.2 fL — ABNORMAL HIGH (ref 80.0–100.0)
Monocytes Absolute: 0.4 10*3/uL (ref 0.1–1.0)
Monocytes Relative: 13 %
Neutro Abs: 2.3 10*3/uL (ref 1.7–7.7)
Neutrophils Relative %: 65 %
Platelet Count: 218 10*3/uL (ref 150–400)
RBC: 3.09 MIL/uL — ABNORMAL LOW (ref 4.22–5.81)
RDW: 13.2 % (ref 11.5–15.5)
WBC Count: 3.4 10*3/uL — ABNORMAL LOW (ref 4.0–10.5)
nRBC: 0 % (ref 0.0–0.2)

## 2020-03-20 LAB — TSH: TSH: 0.666 u[IU]/mL (ref 0.320–4.118)

## 2020-03-20 MED ORDER — SODIUM CHLORIDE 0.9 % IV SOLN
400.0000 mg/m2 | Freq: Once | INTRAVENOUS | Status: AC
  Administered 2020-03-20: 800 mg via INTRAVENOUS
  Filled 2020-03-20: qty 20

## 2020-03-20 MED ORDER — CYANOCOBALAMIN 1000 MCG/ML IJ SOLN
1000.0000 ug | Freq: Once | INTRAMUSCULAR | Status: AC
  Administered 2020-03-20: 1000 ug via INTRAMUSCULAR

## 2020-03-20 MED ORDER — SODIUM CHLORIDE 0.9 % IV SOLN
Freq: Once | INTRAVENOUS | Status: AC
  Filled 2020-03-20: qty 250

## 2020-03-20 MED ORDER — CYANOCOBALAMIN 1000 MCG/ML IJ SOLN
INTRAMUSCULAR | Status: AC
  Filled 2020-03-20: qty 1

## 2020-03-20 MED ORDER — PROCHLORPERAZINE MALEATE 10 MG PO TABS
10.0000 mg | ORAL_TABLET | Freq: Once | ORAL | Status: AC
  Administered 2020-03-20: 10 mg via ORAL

## 2020-03-20 MED ORDER — SODIUM CHLORIDE 0.9 % IV SOLN
200.0000 mg | Freq: Once | INTRAVENOUS | Status: AC
  Administered 2020-03-20: 200 mg via INTRAVENOUS
  Filled 2020-03-20: qty 8

## 2020-03-20 MED ORDER — PROCHLORPERAZINE MALEATE 10 MG PO TABS
ORAL_TABLET | ORAL | Status: AC
  Filled 2020-03-20: qty 1

## 2020-03-20 NOTE — Patient Instructions (Signed)
[facility] Discharge Instructions for Patients Receiving Chemotherapy  Today you received the following chemotherapy agents Keytruda and Alimta   To help prevent nausea and vomiting after your treatment, we encourage you to take your nausea medication as directed.    If you develop nausea and vomiting that is not controlled by your nausea medication, call the clinic.   BELOW ARE SYMPTOMS THAT SHOULD BE REPORTED IMMEDIATELY:  *FEVER GREATER THAN 100.5 F  *CHILLS WITH OR WITHOUT FEVER  NAUSEA AND VOMITING THAT IS NOT CONTROLLED WITH YOUR NAUSEA MEDICATION  *UNUSUAL SHORTNESS OF BREATH  *UNUSUAL BRUISING OR BLEEDING  TENDERNESS IN MOUTH AND THROAT WITH OR WITHOUT PRESENCE OF ULCERS  *URINARY PROBLEMS  *BOWEL PROBLEMS  UNUSUAL RASH Items with * indicate a potential emergency and should be followed up as soon as possible.  Feel free to call the clinic should you have any questions or concerns. The clinic phone number is [phone].  Please show the [facility] at check-in to the Emergency Department and triage nurse.

## 2020-03-21 ENCOUNTER — Ambulatory Visit: Payer: Medicare Other | Admitting: Physician Assistant

## 2020-03-21 ENCOUNTER — Ambulatory Visit: Payer: Medicare Other

## 2020-03-21 ENCOUNTER — Other Ambulatory Visit: Payer: Medicare Other

## 2020-04-11 ENCOUNTER — Inpatient Hospital Stay: Payer: Medicare Other

## 2020-04-11 ENCOUNTER — Inpatient Hospital Stay (HOSPITAL_BASED_OUTPATIENT_CLINIC_OR_DEPARTMENT_OTHER): Payer: Medicare Other | Admitting: Internal Medicine

## 2020-04-11 ENCOUNTER — Encounter: Payer: Self-pay | Admitting: Internal Medicine

## 2020-04-11 ENCOUNTER — Other Ambulatory Visit: Payer: Self-pay

## 2020-04-11 DIAGNOSIS — Z5112 Encounter for antineoplastic immunotherapy: Secondary | ICD-10-CM | POA: Diagnosis not present

## 2020-04-11 DIAGNOSIS — C3412 Malignant neoplasm of upper lobe, left bronchus or lung: Secondary | ICD-10-CM

## 2020-04-11 DIAGNOSIS — C3490 Malignant neoplasm of unspecified part of unspecified bronchus or lung: Secondary | ICD-10-CM | POA: Diagnosis not present

## 2020-04-11 LAB — CBC WITH DIFFERENTIAL (CANCER CENTER ONLY)
Abs Immature Granulocytes: 0.02 10*3/uL (ref 0.00–0.07)
Basophils Absolute: 0 10*3/uL (ref 0.0–0.1)
Basophils Relative: 1 %
Eosinophils Absolute: 0.1 10*3/uL (ref 0.0–0.5)
Eosinophils Relative: 2 %
HCT: 32.6 % — ABNORMAL LOW (ref 39.0–52.0)
Hemoglobin: 10.7 g/dL — ABNORMAL LOW (ref 13.0–17.0)
Immature Granulocytes: 1 %
Lymphocytes Relative: 17 %
Lymphs Abs: 0.7 10*3/uL (ref 0.7–4.0)
MCH: 34.3 pg — ABNORMAL HIGH (ref 26.0–34.0)
MCHC: 32.8 g/dL (ref 30.0–36.0)
MCV: 104.5 fL — ABNORMAL HIGH (ref 80.0–100.0)
Monocytes Absolute: 0.6 10*3/uL (ref 0.1–1.0)
Monocytes Relative: 15 %
Neutro Abs: 2.8 10*3/uL (ref 1.7–7.7)
Neutrophils Relative %: 64 %
Platelet Count: 225 10*3/uL (ref 150–400)
RBC: 3.12 MIL/uL — ABNORMAL LOW (ref 4.22–5.81)
RDW: 13.9 % (ref 11.5–15.5)
WBC Count: 4.3 10*3/uL (ref 4.0–10.5)
nRBC: 0 % (ref 0.0–0.2)

## 2020-04-11 LAB — CMP (CANCER CENTER ONLY)
ALT: 34 U/L (ref 0–44)
AST: 35 U/L (ref 15–41)
Albumin: 3.2 g/dL — ABNORMAL LOW (ref 3.5–5.0)
Alkaline Phosphatase: 112 U/L (ref 38–126)
Anion gap: 12 (ref 5–15)
BUN: 23 mg/dL (ref 8–23)
CO2: 21 mmol/L — ABNORMAL LOW (ref 22–32)
Calcium: 9.7 mg/dL (ref 8.9–10.3)
Chloride: 107 mmol/L (ref 98–111)
Creatinine: 1.57 mg/dL — ABNORMAL HIGH (ref 0.61–1.24)
GFR, Estimated: 46 mL/min — ABNORMAL LOW (ref >60)
Glucose, Bld: 129 mg/dL — ABNORMAL HIGH (ref 70–99)
Potassium: 4.3 mmol/L (ref 3.5–5.1)
Sodium: 140 mmol/L (ref 135–145)
Total Bilirubin: 0.2 mg/dL — ABNORMAL LOW (ref 0.3–1.2)
Total Protein: 7.5 g/dL (ref 6.5–8.1)

## 2020-04-11 LAB — TSH: TSH: 0.669 u[IU]/mL (ref 0.320–4.118)

## 2020-04-11 MED ORDER — SODIUM CHLORIDE 0.9 % IV SOLN
400.0000 mg/m2 | Freq: Once | INTRAVENOUS | Status: AC
  Administered 2020-04-11: 800 mg via INTRAVENOUS
  Filled 2020-04-11: qty 20

## 2020-04-11 MED ORDER — SODIUM CHLORIDE 0.9 % IV SOLN
Freq: Once | INTRAVENOUS | Status: AC
  Filled 2020-04-11: qty 250

## 2020-04-11 MED ORDER — PROCHLORPERAZINE MALEATE 10 MG PO TABS
ORAL_TABLET | ORAL | Status: AC
  Filled 2020-04-11: qty 1

## 2020-04-11 MED ORDER — PROCHLORPERAZINE MALEATE 10 MG PO TABS
10.0000 mg | ORAL_TABLET | Freq: Once | ORAL | Status: AC
  Administered 2020-04-11: 10 mg via ORAL

## 2020-04-11 MED ORDER — SODIUM CHLORIDE 0.9 % IV SOLN
200.0000 mg | Freq: Once | INTRAVENOUS | Status: AC
  Administered 2020-04-11: 200 mg via INTRAVENOUS
  Filled 2020-04-11: qty 8

## 2020-04-11 NOTE — Progress Notes (Signed)
Per Dr Julien Nordmann it is okay to treat pt today with Alimta and Keytruda.

## 2020-04-11 NOTE — Progress Notes (Signed)
Pt ambulatory, steady gait, tolerated treatment well, driving self home.

## 2020-04-11 NOTE — Progress Notes (Signed)
[place] [phone]   [phone]  OFFICE PROGRESS NOTE  [name], MD [address] [place] [address]  DIAGNOSIS:  Stage IV (T1b, N3, M1c) non-small cell lung cancer, adenocarcinoma. 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  Biomarker Findings Tumor Mutational Burden - 10 Muts/Mb Microsatellite status - MS-Stable Genomic Findings For a complete list of the genes assayed, please refer to the Appendix. ERBB2 amplification - equivocal? EML54 G92* FANCC splice site 010+0F>H SMAD4 Q289* TP53 E298* 7 Disease relevant genes with no reportable alterations: ALK, BRAF, EGFR, KRAS, MET, RET, ROS1   PDL1 expression is 30%.  PRIOR THERAPY: 1) Weekly concurrent chemoradiation with carboplatin for an AUC of 2 and paclitaxel 45 mg/m2 to the locally advanced disease in the chest. He started the first dose 01/31/2019. Status post7cycles.Last dose of chemotherapy was given on March 14, 2019. 2) palliative radiotherapy to the metastatic disease in the right adrenal gland. 3) palliative radiotherapy to the metastatic deposit in the right obturator externus and abductor magnus under the care of Dr. [name].  CURRENT THERAPY: Systemic chemotherapy with carboplatin for AUC of 5, Alimta 500 mg/M2 and Keytruda 200 mg IV every 3 weeks. First dose April 20, 2019. Status post 17 cycles.   Starting from cycle #5, the patient is on maintenance treatment with Alimta and Keytruda every 3 weeks.  INTERVAL HISTORY: Trevor Bailey [place] 74 y.o. male returns to the clinic today for follow-up visit.  The patient is feeling fine today with no concerning complaints except for mild fatigue and pain in the left side of the neck from the radiation scar.  He denied having any chest  pain, shortness of breath, cough or hemoptysis.  He denied having any nausea, vomiting, diarrhea or constipation.  He has no headache or visual changes.  He has no recent fever or chills.  He lost 2 pounds since his last visit.  The patient is here today for evaluation before starting cycle #18 of his treatment.   MEDICAL HISTORY: Past Medical History:  Diagnosis Date  . Adenopathy    left supraclavicular lymph node  . Allergies   . COPD (chronic obstructive pulmonary disease) ([place])   . Coronary disease   . Diabetes ([place])   . Enlarged prostate   . GERD (gastroesophageal reflux disease)   . Glaucoma   . Hyperlipidemia   . Hypertension   . Myocardial infarction ([place])   . nscl ca dx'd 12/2018  . Sleep apnea    wears CPAP  . Wears partial dentures    upper and lower    ALLERGIES:  has No Known Allergies.  MEDICATIONS:  Current Outpatient Medications  Medication Sig Dispense Refill  . ACCU-CHEK GUIDE test strip     . amLODipine (NORVASC) 5 MG tablet TAKE 1 TABLET BY MOUTH  DAILY 90 tablet 3  . aspirin EC 81 MG tablet Take 81 mg by mouth daily.    Marland Kitchen atorvastatin (LIPITOR) 80 MG tablet Take 80 mg by mouth daily.    Marland Kitchen augmented betamethasone dipropionate (DIPROLENE-AF) 0.05 % cream     . CLARITIN 10 MG tablet Take 10 mg by mouth daily.     . clopidogrel (PLAVIX) 75 MG tablet Take 1 tablet (75 mg total) by mouth every evening. 30 tablet 0  . ezetimibe (ZETIA)  10 MG tablet Take 10 mg by mouth daily.    . finasteride (PROSCAR) 5 MG tablet Take 5 mg by mouth daily.     . folic acid (FOLVITE) 1 MG tablet TAKE 1 TABLET BY MOUTH  DAILY 90 tablet 3  . hydrochlorothiazide (HYDRODIURIL) 25 MG tablet Take 25 mg by mouth daily.     Marland Kitchen latanoprost (XALATAN) 0.005 % ophthalmic solution Place 1 drop into both eyes at bedtime.     . metFORMIN (GLUCOPHAGE) 1000 MG tablet Take 1,000 mg by mouth 2 (two) times daily.     . metoprolol tartrate (LOPRESSOR) 25 MG tablet Take 25 mg 2 (two) times daily by  mouth.    . mirtazapine (REMERON) 30 MG tablet TAKE 1 TABLET BY MOUTH AT  BEDTIME 90 tablet 3  . Multiple Vitamin (MULTIVITAMIN WITH MINERALS) TABS tablet Take 1 tablet by mouth daily. Centrum Silver    . nitroGLYCERIN (NITROSTAT) 0.4 MG SL tablet Place 1 tablet (0.4 mg total) under the tongue every 5 (five) minutes as needed for chest pain. 25 tablet 6  . ofloxacin (FLOXIN) 0.3 % OTIC solution     . OVER THE COUNTER MEDICATION Take 1 tablet by mouth daily as needed. Colace    . pantoprazole (PROTONIX) 40 MG tablet Take 40 mg daily by mouth.    Vladimir Faster Glycol-Propyl Glycol (SYSTANE) 0.4-0.3 % SOLN Place 1-2 drops into both eyes 3 (three) times daily as needed (dry/irritated eyes.).    Marland Kitchen prochlorperazine (COMPAZINE) 10 MG tablet Take 10 mg by mouth daily as needed for nausea or vomiting.    . ramipril (ALTACE) 10 MG capsule Take 10 mg by mouth 2 (two) times daily.      No current facility-administered medications for this visit.    SURGICAL HISTORY:  Past Surgical History:  Procedure Laterality Date  . ABDOMINAL AORTAGRAM Left 09/16/2013   Procedure: ABDOMINAL Maxcine Ham;  Surgeon: Elam Dutch, MD;  Location: [facility] CATH LAB;  Service: Cardiovascular;  Laterality: Left;  . CARDIAC CATHETERIZATION  01/2011   When there was segmental stenosis of the distal RCA, patent PCA stent, patent circumflex stent, and a patent small diagonal with 90% ISR.   Marland Kitchen COLONOSCOPY W/ BIOPSIES AND POLYPECTOMY    . CORONARY ANGIOPLASTY  may 2002   Non-DES stenting of his circumflex, non-DES stenting of the RCA  . HYDROCELE EXCISION / REPAIR  2009   by Dr Janice Norrie  . INGUINAL HERNIA REPAIR Bilateral    Dr Bubba Camp  . LYMPH NODE BIOPSY Left 01/10/2019   Procedure: left supraclavicular LYMPH NODE BIOPSY;  Surgeon: [name], MD;  Location: [facility];  Service: Thoracic;  Laterality: Left;  Marland Kitchen MULTIPLE TOOTH EXTRACTIONS    . NM MYOCAR PERF WALL MOTION  01/08/2011   moderate in size and intensity area of reversible  ischemia in the basal to mid inferior and septal territories. Abnormal study  . stents  2008   proximal RCA, DES for progession of disease.  Marland Kitchen US ECHOCARDIOGRAPHY  01/12/2012   mild concentric LVH, borderline LA enlargement, mild to mod TR    REVIEW OF SYSTEMS:  A comprehensive review of systems was negative except for: Constitutional: positive for fatigue and weight loss   PHYSICAL EXAMINATION: General appearance: alert, cooperative and no distress Head: Normocephalic, without obvious abnormality, atraumatic Neck: no adenopathy, no JVD, supple, symmetrical, trachea midline and thyroid not enlarged, symmetric, no tenderness/mass/nodules Lymph nodes: Cervical, supraclavicular, and axillary nodes normal. Resp: clear to auscultation bilaterally Back: symmetric,  no curvature. ROM normal. No CVA tenderness. Cardio: regular rate and rhythm, S1, S2 normal, no murmur, click, rub or gallop GI: soft, non-tender; bowel sounds normal; no masses,  no organomegaly Extremities: extremities normal, atraumatic, no cyanosis or edema  ECOG PERFORMANCE STATUS: 1 - Symptomatic but completely ambulatory  Blood pressure 130/81, pulse 77, temperature 98.2 F (36.8 C), temperature source Tympanic, resp. rate 20, height _0  (1.753 m), weight 177 lb 12.8 oz (80.6 kg), SpO2 100 %.  LABORATORY DATA: Lab Results  Component Value Date   WBC 4.3 04/11/2020   HGB 10.7 (L) 04/11/2020   HCT 32.6 (L) 04/11/2020   MCV 104.5 (H) 04/11/2020   PLT 225 04/11/2020      Chemistry      Component Value Date/Time   NA 140 04/11/2020 1117   K 4.3 04/11/2020 1117   CL 107 04/11/2020 1117   CO2 21 (L) 04/11/2020 1117   BUN 23 04/11/2020 1117   CREATININE 1.57 (H) 04/11/2020 1117   CREATININE 0.94 05/16/2014 1109      Component Value Date/Time   CALCIUM 9.7 04/11/2020 1117   ALKPHOS 112 04/11/2020 1117   AST 35 04/11/2020 1117   ALT 34 04/11/2020 1117   BILITOT 0.2 (L) 04/11/2020 1117       RADIOGRAPHIC  STUDIES: No results found.  ASSESSMENT AND PLAN: This is a very pleasant 74 years old African-American male with stage IV non-small cell lung cancer, adenocarcinoma with no actionable mutation and PD-L1 expression of 30%, presented with locally advanced disease in the chest as well as solitary metastasis to the right adrenal gland. The patient completed a course of concurrent chemoradiation to the chest with weekly carboplatin for AUC of 2 and paclitaxel 45 mg/M2 status post 7 cycles..  The patient had evidence for disease progression and he was started on systemic chemotherapy with carboplatin, Alimta and Keytruda for 4 cycles and he is currently on maintenance treatment with Alimta and Keytruda every 3 weeks status post 13 more cycles. The patient continues to tolerate his treatment well with no concerning adverse effect except for mild fatigue. I recommended for him to proceed with cycle #14 of his maintenance therapy today as planned. I will see him back for follow-up visit in 3 weeks for evaluation with repeat CT scan of the chest, abdomen pelvis for restaging of his disease. For the metastatic muscular lesion The patient underwent palliative radiotherapy to the right femur area. For the lack of appetite he will continue his current treatment with Remeron 30 mg p.o. nightly. The patient was advised to call immediately if he has any concerning symptoms in the interval. The patient voices understanding of current disease status and treatment options and is in agreement with the current care plan. All questions were answered. The patient knows to call the clinic with any problems, questions or concerns. We can certainly see the patient much sooner if necessary.   Disclaimer: This note was dictated with voice recognition software. Similar sounding words can inadvertently be transcribed and may not be corrected upon review.

## 2020-04-11 NOTE — Patient Instructions (Signed)
[facility] Discharge Instructions for Patients Receiving Chemotherapy  Today you received the following chemotherapy agents: Keytruda and Alimta   To help prevent nausea and vomiting after your treatment, we encourage you to take your nausea medication as directed.    If you develop nausea and vomiting that is not controlled by your nausea medication, call the clinic.   BELOW ARE SYMPTOMS THAT SHOULD BE REPORTED IMMEDIATELY:  *FEVER GREATER THAN 100.5 F  *CHILLS WITH OR WITHOUT FEVER  NAUSEA AND VOMITING THAT IS NOT CONTROLLED WITH YOUR NAUSEA MEDICATION  *UNUSUAL SHORTNESS OF BREATH  *UNUSUAL BRUISING OR BLEEDING  TENDERNESS IN MOUTH AND THROAT WITH OR WITHOUT PRESENCE OF ULCERS  *URINARY PROBLEMS  *BOWEL PROBLEMS  UNUSUAL RASH Items with * indicate a potential emergency and should be followed up as soon as possible.  Feel free to call the clinic should you have any questions or concerns. The clinic phone number is [phone].  Please show the [place] at check-in to the Emergency Department and triage nurse.

## 2020-04-17 ENCOUNTER — Telehealth: Payer: Self-pay | Admitting: Internal Medicine

## 2020-04-17 NOTE — Telephone Encounter (Signed)
Scheduled per los. Called and left msg. Mailed printout of new appts

## 2020-04-29 NOTE — Progress Notes (Signed)
  Radiation Oncology         [phone] ________________________________  Name: Trevor Bailey MRN: [id number]  Date: 02/17/2020  DOB: [birth year]  End of Treatment Note  Diagnosis:   Stage IV NSCLC     Indication for treatment::  palliative       Radiation treatment dates:   01/31/20 - 02/17/20  Site/dose:   The right pelvis was treated to a dose of 60 Gy in 15 fractions using a 3-field IMRT technique.  Narrative: The patient tolerated radiation treatment relatively well.     Plan: The patient has completed radiation treatment. The patient will return to radiation oncology clinic for routine followup in one month. I advised the patient to call or return sooner if they have any questions or concerns related to their recovery or treatment. ________________________________  Jodelle Gross, M.D., Ph.D.

## 2020-04-30 ENCOUNTER — Encounter (HOSPITAL_COMMUNITY): Payer: Self-pay

## 2020-04-30 ENCOUNTER — Ambulatory Visit (HOSPITAL_COMMUNITY)
Admission: RE | Admit: 2020-04-30 | Discharge: 2020-04-30 | Disposition: A | Discharge status: Home / Self Care | Payer: Medicare Other | Source: Ambulatory Visit | Attending: Internal Medicine | Admitting: Internal Medicine

## 2020-04-30 ENCOUNTER — Other Ambulatory Visit: Payer: Self-pay

## 2020-04-30 DIAGNOSIS — C3490 Malignant neoplasm of unspecified part of unspecified bronchus or lung: Secondary | ICD-10-CM | POA: Diagnosis present

## 2020-05-02 ENCOUNTER — Inpatient Hospital Stay: Payer: Medicare Other | Admitting: Internal Medicine

## 2020-05-02 ENCOUNTER — Encounter: Payer: Self-pay | Admitting: Internal Medicine

## 2020-05-02 ENCOUNTER — Other Ambulatory Visit: Payer: Self-pay

## 2020-05-02 ENCOUNTER — Inpatient Hospital Stay: Payer: Medicare Other

## 2020-05-02 ENCOUNTER — Inpatient Hospital Stay: Payer: Medicare Other | Attending: Internal Medicine

## 2020-05-02 VITALS — BP 128/77 | HR 80 | Temp 98.9°F | Resp 18 | Ht 69.0 in | Wt 179.8 lb

## 2020-05-02 DIAGNOSIS — I10 Essential (primary) hypertension: Secondary | ICD-10-CM | POA: Diagnosis not present

## 2020-05-02 DIAGNOSIS — C7971 Secondary malignant neoplasm of right adrenal gland: Secondary | ICD-10-CM | POA: Diagnosis not present

## 2020-05-02 DIAGNOSIS — Z79899 Other long term (current) drug therapy: Secondary | ICD-10-CM | POA: Insufficient documentation

## 2020-05-02 DIAGNOSIS — Z5111 Encounter for antineoplastic chemotherapy: Secondary | ICD-10-CM

## 2020-05-02 DIAGNOSIS — C3412 Malignant neoplasm of upper lobe, left bronchus or lung: Secondary | ICD-10-CM

## 2020-05-02 DIAGNOSIS — Z5112 Encounter for antineoplastic immunotherapy: Secondary | ICD-10-CM

## 2020-05-02 DIAGNOSIS — C770 Secondary and unspecified malignant neoplasm of lymph nodes of head, face and neck: Secondary | ICD-10-CM | POA: Diagnosis not present

## 2020-05-02 LAB — CBC WITH DIFFERENTIAL (CANCER CENTER ONLY)
Abs Immature Granulocytes: 0.02 10*3/uL (ref 0.00–0.07)
Basophils Absolute: 0 10*3/uL (ref 0.0–0.1)
Basophils Relative: 0 %
Eosinophils Absolute: 0.1 10*3/uL (ref 0.0–0.5)
Eosinophils Relative: 1 %
HCT: 30.7 % — ABNORMAL LOW (ref 39.0–52.0)
Hemoglobin: 10.3 g/dL — ABNORMAL LOW (ref 13.0–17.0)
Immature Granulocytes: 0 %
Lymphocytes Relative: 9 %
Lymphs Abs: 0.6 10*3/uL — ABNORMAL LOW (ref 0.7–4.0)
MCH: 34 pg (ref 26.0–34.0)
MCHC: 33.6 g/dL (ref 30.0–36.0)
MCV: 101.3 fL — ABNORMAL HIGH (ref 80.0–100.0)
Monocytes Absolute: 0.8 10*3/uL (ref 0.1–1.0)
Monocytes Relative: 11 %
Neutro Abs: 5.7 10*3/uL (ref 1.7–7.7)
Neutrophils Relative %: 79 %
Platelet Count: 260 10*3/uL (ref 150–400)
RBC: 3.03 MIL/uL — ABNORMAL LOW (ref 4.22–5.81)
RDW: 14.5 % (ref 11.5–15.5)
WBC Count: 7.2 10*3/uL (ref 4.0–10.5)
nRBC: 0 % (ref 0.0–0.2)

## 2020-05-02 LAB — CMP (CANCER CENTER ONLY)
ALT: 39 U/L (ref 0–44)
AST: 31 U/L (ref 15–41)
Albumin: 3.3 g/dL — ABNORMAL LOW (ref 3.5–5.0)
Alkaline Phosphatase: 112 U/L (ref 38–126)
Anion gap: 9 (ref 5–15)
BUN: 30 mg/dL — ABNORMAL HIGH (ref 8–23)
CO2: 25 mmol/L (ref 22–32)
Calcium: 10.5 mg/dL — ABNORMAL HIGH (ref 8.9–10.3)
Chloride: 107 mmol/L (ref 98–111)
Creatinine: 1.93 mg/dL — ABNORMAL HIGH (ref 0.61–1.24)
GFR, Estimated: 36 mL/min — ABNORMAL LOW (ref >60)
Glucose, Bld: 163 mg/dL — ABNORMAL HIGH (ref 70–99)
Potassium: 4.3 mmol/L (ref 3.5–5.1)
Sodium: 141 mmol/L (ref 135–145)
Total Bilirubin: 0.3 mg/dL (ref 0.3–1.2)
Total Protein: 7.9 g/dL (ref 6.5–8.1)

## 2020-05-02 LAB — TSH: TSH: 0.938 u[IU]/mL (ref 0.320–4.118)

## 2020-05-02 MED ORDER — PROCHLORPERAZINE MALEATE 10 MG PO TABS
ORAL_TABLET | ORAL | Status: AC
  Filled 2020-05-02: qty 1

## 2020-05-02 MED ORDER — SODIUM CHLORIDE 0.9 % IV SOLN
Freq: Once | INTRAVENOUS | Status: AC
  Filled 2020-05-02: qty 250

## 2020-05-02 MED ORDER — SODIUM CHLORIDE 0.9 % IV SOLN
400.0000 mg/m2 | Freq: Once | INTRAVENOUS | Status: DC
  Filled 2020-05-02: qty 32

## 2020-05-02 MED ORDER — PROCHLORPERAZINE MALEATE 10 MG PO TABS
10.0000 mg | ORAL_TABLET | Freq: Once | ORAL | Status: AC
  Administered 2020-05-02: 13:00:00 10 mg via ORAL

## 2020-05-02 MED ORDER — SODIUM CHLORIDE 0.9 % IV SOLN
200.0000 mg | Freq: Once | INTRAVENOUS | Status: AC
  Administered 2020-05-02: 14:00:00 200 mg via INTRAVENOUS
  Filled 2020-05-02: qty 8

## 2020-05-02 NOTE — Progress Notes (Signed)
[place] [phone]   [phone]  OFFICE PROGRESS NOTE  [name], MD [address] [place] [address]  DIAGNOSIS:  Stage IV (T1b, N3, M1c) non-small cell lung cancer, adenocarcinoma. 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  Biomarker Findings Tumor Mutational Burden - 10 Muts/Mb Microsatellite status - MS-Stable Genomic Findings For a complete list of the genes assayed, please refer to the Appendix. ERBB2 amplification - equivocal CHE52 D78* FANCC splice site 242+3N>T SMAD4 Q289* TP53 E298* 7 Disease relevant genes with no reportable alterations: ALK, BRAF, EGFR, KRAS, MET, RET, ROS1   PDL1 expression is 30%.  PRIOR THERAPY: 1) Weekly concurrent chemoradiation with carboplatin for an AUC of 2 and paclitaxel 45 mg/m2 to the locally advanced disease in the chest. He started the first dose 01/31/2019. Status post7cycles.Last dose of chemotherapy was given on March 14, 2019. 2) palliative radiotherapy to the metastatic disease in the right adrenal gland. 3) palliative radiotherapy to the metastatic deposit in the right obturator externus and abductor magnus under the care of Dr. [name].  CURRENT THERAPY: Systemic chemotherapy with carboplatin for AUC of 5, Alimta 500 mg/M2 and Keytruda 200 mg IV every 3 weeks. First dose April 20, 2019. Status post 17 cycles.   Starting from cycle #5, the patient is on maintenance treatment with Alimta and Keytruda every 3 weeks.  INTERVAL HISTORY: Trevor Bailey Couse 74 y.o. male returns to the clinic today for follow-up visit. The patient is feeling fine today with no concerning complaints except for intermittent pain in the left shoulder. He denied having any current chest pain, shortness of breath, cough  or hemoptysis. He denied having any nausea, vomiting, diarrhea or constipation. He denied having any headache or visual changes. He has no fever or chills. He has been tolerating his maintenance therapy with Alimta and Keytruda fairly well. He had repeat CT scan of the chest, abdomen pelvis performed recently and is here for evaluation and discussion of his discuss results.  MEDICAL HISTORY: Past Medical History:  Diagnosis Date   Adenopathy    left supraclavicular lymph node   Allergies    COPD (chronic obstructive pulmonary disease) ([place])    Coronary disease    Diabetes ([place])    Enlarged prostate    GERD (gastroesophageal reflux disease)    Glaucoma    Hyperlipidemia    Hypertension    Myocardial infarction ([place])    nscl ca dx'd 12/2018   Sleep apnea    wears CPAP   Wears partial dentures    upper and lower    ALLERGIES:  has No Known Allergies.  MEDICATIONS:  Current Outpatient Medications  Medication Sig Dispense Refill   ACCU-CHEK GUIDE test strip      amLODipine (NORVASC) 5 MG tablet TAKE 1 TABLET BY MOUTH  DAILY 90 tablet 3   aspirin EC 81 MG tablet Take 81 mg by mouth daily.     atorvastatin (LIPITOR) 80 MG tablet Take 80 mg by mouth daily.     augmented betamethasone dipropionate (DIPROLENE-AF) 0.05 % cream      CLARITIN 10 MG tablet Take 10 mg by mouth daily.      clopidogrel (PLAVIX) 75 MG tablet Take 1 tablet (75 mg total) by mouth every evening. 30 tablet 0   ezetimibe (ZETIA) 10  MG tablet Take 10 mg by mouth daily.     finasteride (PROSCAR) 5 MG tablet Take 5 mg by mouth daily.      folic acid (FOLVITE) 1 MG tablet TAKE 1 TABLET BY MOUTH  DAILY 90 tablet 3   hydrochlorothiazide (HYDRODIURIL) 25 MG tablet Take 25 mg by mouth daily.      latanoprost (XALATAN) 0.005 % ophthalmic solution Place 1 drop into both eyes at bedtime.      metFORMIN (GLUCOPHAGE) 1000 MG tablet Take 1,000 mg by mouth 2 (two) times daily.      metoprolol tartrate  (LOPRESSOR) 25 MG tablet Take 25 mg 2 (two) times daily by mouth.     mirtazapine (REMERON) 30 MG tablet TAKE 1 TABLET BY MOUTH AT  BEDTIME 90 tablet 3   Multiple Vitamin (MULTIVITAMIN WITH MINERALS) TABS tablet Take 1 tablet by mouth daily. Centrum Silver     nitroGLYCERIN (NITROSTAT) 0.4 MG SL tablet Place 1 tablet (0.4 mg total) under the tongue every 5 (five) minutes as needed for chest pain. 25 tablet 6   ofloxacin (FLOXIN) 0.3 % OTIC solution      OVER THE COUNTER MEDICATION Take 1 tablet by mouth daily as needed. Colace     pantoprazole (PROTONIX) 40 MG tablet Take 40 mg daily by mouth.     Polyethyl Glycol-Propyl Glycol (SYSTANE) 0.4-0.3 % SOLN Place 1-2 drops into both eyes 3 (three) times daily as needed (dry/irritated eyes.).     prochlorperazine (COMPAZINE) 10 MG tablet Take 10 mg by mouth daily as needed for nausea or vomiting.     ramipril (ALTACE) 10 MG capsule Take 10 mg by mouth 2 (two) times daily.      No current facility-administered medications for this visit.    SURGICAL HISTORY:  Past Surgical History:  Procedure Laterality Date   ABDOMINAL AORTAGRAM Left 09/16/2013   Procedure: ABDOMINAL AORTAGRAM;  Surgeon: Elam Dutch, MD;  Location: [facility] CATH LAB;  Service: Cardiovascular;  Laterality: Left;   CARDIAC CATHETERIZATION  01/2011   When there was segmental stenosis of the distal RCA, patent PCA stent, patent circumflex stent, and a patent small diagonal with 90% ISR.    COLONOSCOPY W/ BIOPSIES AND POLYPECTOMY     CORONARY ANGIOPLASTY  may 2002   Non-DES stenting of his circumflex, non-DES stenting of the RCA   HYDROCELE EXCISION / REPAIR  2009   by Dr Jackquline Bosch HERNIA REPAIR Bilateral    Dr Bubba Camp   LYMPH NODE BIOPSY Left 01/10/2019   Procedure: left supraclavicular LYMPH NODE BIOPSY;  Surgeon: [name], MD;  Location: [facility];  Service: Thoracic;  Laterality: Left;   MULTIPLE TOOTH EXTRACTIONS     NM [place]   01/08/2011   moderate in size and intensity area of reversible ischemia in the basal to mid inferior and septal territories. Abnormal study   stents  2008   proximal RCA, DES for progession of disease.   US ECHOCARDIOGRAPHY  01/12/2012   mild concentric LVH, borderline LA enlargement, mild to mod TR    REVIEW OF SYSTEMS:  Constitutional: negative Eyes: negative Ears, nose, mouth, throat, and face: negative Respiratory: negative Cardiovascular: negative Gastrointestinal: negative Genitourinary:negative Integument/breast: negative Hematologic/lymphatic: negative Musculoskeletal:positive for arthralgias Neurological: negative Behavioral/Psych: negative Endocrine: negative Allergic/Immunologic: negative   PHYSICAL EXAMINATION: General appearance: alert, cooperative and no distress Head: Normocephalic, without obvious abnormality, atraumatic Neck: no adenopathy, no JVD, supple, symmetrical, trachea midline and thyroid not enlarged, symmetric, no  tenderness/mass/nodules Lymph nodes: Cervical, supraclavicular, and axillary nodes normal. Resp: clear to auscultation bilaterally Back: symmetric, no curvature. ROM normal. No CVA tenderness. Cardio: regular rate and rhythm, S1, S2 normal, no murmur, click, rub or gallop GI: soft, non-tender; bowel sounds normal; no masses,  no organomegaly Extremities: extremities normal, atraumatic, no cyanosis or edema Neurologic: Alert and oriented X 3, normal strength and tone. Normal symmetric reflexes. Normal coordination and gait  ECOG PERFORMANCE STATUS: 1 - Symptomatic but completely ambulatory  Blood pressure 128/77, pulse 80, temperature 98.9 F (37.2 C), temperature source Tympanic, resp. rate 18, height 5' 9" (1.753 m), weight 179 lb 12.8 oz (81.6 kg), SpO2 100 %.  LABORATORY DATA: Lab Results  Component Value Date   WBC 7.2 05/02/2020   HGB 10.3 (L) 05/02/2020   HCT 30.7 (L) 05/02/2020   MCV 101.3 (H) 05/02/2020   PLT 260 05/02/2020       Chemistry      Component Value Date/Time   NA 140 04/11/2020 1117   K 4.3 04/11/2020 1117   CL 107 04/11/2020 1117   CO2 21 (L) 04/11/2020 1117   BUN 23 04/11/2020 1117   CREATININE 1.57 (H) 04/11/2020 1117   CREATININE 0.94 05/16/2014 1109      Component Value Date/Time   CALCIUM 9.7 04/11/2020 1117   ALKPHOS 112 04/11/2020 1117   AST 35 04/11/2020 1117   ALT 34 04/11/2020 1117   BILITOT 0.2 (L) 04/11/2020 1117       RADIOGRAPHIC STUDIES: CT Abdomen Pelvis Wo Contrast  Result Date: 04/30/2020 CLINICAL DATA:  Non-small cell lung cancer staging in a 74 year old male. EXAM: CT CHEST, ABDOMEN AND PELVIS WITHOUT CONTRAST TECHNIQUE: Multidetector CT imaging of the chest, abdomen and pelvis was performed following the standard protocol without IV contrast. COMPARISON:  February 28, 2020 FINDINGS: CT CHEST FINDINGS Cardiovascular: Calcified atheromatous plaque of the thoracic aorta. Three-vessel coronary artery disease. Heart size normal without pericardial effusion. Central pulmonary vasculature stable appearance in terms of caliber. Limited assessment of cardiovascular structures given lack of intravenous contrast. Mediastinum/Nodes: No thoracic inlet adenopathy. No axillary lymphadenopathy. No gross hilar lymphadenopathy. Esophagus grossly normal. Lungs/Pleura: Increasing confluence of scarring/post treatment changes in the LEFT upper lobe. Further volume loss obscures the nodular area discussed on the previous examination in this location. Interval development of a very small LEFT-sided pleural effusion, associated with pleural calcification. Effusion is new. Calcification was present previously. Subtle ground-glass focus along major fissure in the LEFT chest in the superior segment of LEFT lower lobe measures 6 mm (image 40, series 4) airways are patent. Musculoskeletal: Spinal degenerative changes. No acute or destructive bone process. See below for full musculoskeletal details. CT  ABDOMEN PELVIS FINDINGS Hepatobiliary: Cysts in the liver. The no focal, suspicious hepatic lesion on noncontrast imaging. No pericholecystic stranding. No gross biliary duct dilation. Pancreas: Normal contour.  No sign of inflammation. Spleen: Normal in size and contour. Adrenals/Urinary Tract: Normal adrenal glands. Low-density lesion arising from the posterior RIGHT kidney unchanged likely a cyst. No hydronephrosis. No nephrolithiasis. Stomach/Bowel: Stomach without signs of acute process. Small bowel is normal caliber. The appendix is normal. Colonic diverticulosis.  No sign of diverticulitis. Vascular/Lymphatic: Aortic atherosclerosis. No aneurysmal dilation. There is no gastrohepatic or hepatoduodenal ligament lymphadenopathy. No retroperitoneal or mesenteric lymphadenopathy. No pelvic sidewall lymphadenopathy. Reproductive: Prostate grossly normal on CT. Signs of calcification of the gland and mild enlargement as before. Other: No ascites.  Small fat containing umbilical hernia. Musculoskeletal: Low attenuation in the RIGHT abductor magnus  with less expansion of the muscle than on the prior exam, with respect imaged portions now measuring 2 cm the level of the RIGHT lesser trochanter, at a similar location on the prior study measuring approximately 3.8 cm. Spinal degenerative changes. Heterogeneous appearance of the L2 vertebral body and posterior elements with no change. IMPRESSION: 1. Increasing confluence of scarring/post treatment changes in the LEFT upper lobe. Further volume loss obscures the nodular area discussed on the previous examination in this location. Attention on follow-up. 2. Small LEFT-sided pleural effusion new when compared to the prior study. Attention on follow-up. Pleural calcification with similar appearance. 3. Subtle ground-glass focus along the major fissure in the LEFT chest in the superior segment of LEFT lower lobe measures 6 this may be related to post treatment change,  attention on follow-up is suggested. 4. No signs of metastatic disease in the abdomen or pelvis. 5. Decreasing thickness of ill-defined area of hypodensity compatible with intramuscular metastatic lesion, area is incompletely imaged but appears improved with respect imaged portions. 6. Three-vessel coronary artery disease. 7. Aortic atherosclerosis. Aortic Atherosclerosis (ICD10-I70.0). Electronically Signed   By: Zetta Bills M.D.   On: 04/30/2020 09:46   CT Chest Wo Contrast  Result Date: 04/30/2020 CLINICAL DATA:  Non-small cell lung cancer staging in a 74 year old male. EXAM: CT CHEST, ABDOMEN AND PELVIS WITHOUT CONTRAST TECHNIQUE: Multidetector CT imaging of the chest, abdomen and pelvis was performed following the standard protocol without IV contrast. COMPARISON:  February 28, 2020 FINDINGS: CT CHEST FINDINGS Cardiovascular: Calcified atheromatous plaque of the thoracic aorta. Three-vessel coronary artery disease. Heart size normal without pericardial effusion. Central pulmonary vasculature stable appearance in terms of caliber. Limited assessment of cardiovascular structures given lack of intravenous contrast. Mediastinum/Nodes: No thoracic inlet adenopathy. No axillary lymphadenopathy. No gross hilar lymphadenopathy. Esophagus grossly normal. Lungs/Pleura: Increasing confluence of scarring/post treatment changes in the LEFT upper lobe. Further volume loss obscures the nodular area discussed on the previous examination in this location. Interval development of a very small LEFT-sided pleural effusion, associated with pleural calcification. Effusion is new. Calcification was present previously. Subtle ground-glass focus along major fissure in the LEFT chest in the superior segment of LEFT lower lobe measures 6 mm (image 40, series 4) airways are patent. Musculoskeletal: Spinal degenerative changes. No acute or destructive bone process. See below for full musculoskeletal details. CT ABDOMEN PELVIS  FINDINGS Hepatobiliary: Cysts in the liver. The no focal, suspicious hepatic lesion on noncontrast imaging. No pericholecystic stranding. No gross biliary duct dilation. Pancreas: Normal contour.  No sign of inflammation. Spleen: Normal in size and contour. Adrenals/Urinary Tract: Normal adrenal glands. Low-density lesion arising from the posterior RIGHT kidney unchanged likely a cyst. No hydronephrosis. No nephrolithiasis. Stomach/Bowel: Stomach without signs of acute process. Small bowel is normal caliber. The appendix is normal. Colonic diverticulosis.  No sign of diverticulitis. Vascular/Lymphatic: Aortic atherosclerosis. No aneurysmal dilation. There is no gastrohepatic or hepatoduodenal ligament lymphadenopathy. No retroperitoneal or mesenteric lymphadenopathy. No pelvic sidewall lymphadenopathy. Reproductive: Prostate grossly normal on CT. Signs of calcification of the gland and mild enlargement as before. Other: No ascites.  Small fat containing umbilical hernia. Musculoskeletal: Low attenuation in the RIGHT abductor magnus with less expansion of the muscle than on the prior exam, with respect imaged portions now measuring 2 cm the level of the RIGHT lesser trochanter, at a similar location on the prior study measuring approximately 3.8 cm. Spinal degenerative changes. Heterogeneous appearance of the L2 vertebral body and posterior elements with no change. IMPRESSION: 1.  Increasing confluence of scarring/post treatment changes in the LEFT upper lobe. Further volume loss obscures the nodular area discussed on the previous examination in this location. Attention on follow-up. 2. Small LEFT-sided pleural effusion new when compared to the prior study. Attention on follow-up. Pleural calcification with similar appearance. 3. Subtle ground-glass focus along the major fissure in the LEFT chest in the superior segment of LEFT lower lobe measures 6 this may be related to post treatment change, attention on follow-up  is suggested. 4. No signs of metastatic disease in the abdomen or pelvis. 5. Decreasing thickness of ill-defined area of hypodensity compatible with intramuscular metastatic lesion, area is incompletely imaged but appears improved with respect imaged portions. 6. Three-vessel coronary artery disease. 7. Aortic atherosclerosis. Aortic Atherosclerosis (ICD10-I70.0). Electronically Signed   By: Zetta Bills M.D.   On: 04/30/2020 09:46    ASSESSMENT AND PLAN: This is a very pleasant 74 years old African-American male with stage IV non-small cell lung cancer, adenocarcinoma with no actionable mutation and PD-L1 expression of 30%, presented with locally advanced disease in the chest as well as solitary metastasis to the right adrenal gland. The patient completed a course of concurrent chemoradiation to the chest with weekly carboplatin for AUC of 2 and paclitaxel 45 mg/M2 status post 7 cycles..  The patient had evidence for disease progression and he was started on systemic chemotherapy with carboplatin, Alimta and Keytruda for 4 cycles and he is currently on maintenance treatment with Alimta and Keytruda every 3 weeks status post 14 more cycles. The patient has been tolerating this treatment well with no concerning adverse effects. He had repeat CT scan of the chest, abdomen pelvis performed recently. I personally and independently reviewed the scans and discussed the results with the patient today. His scan showed no concerning findings for disease progression and there was improvement and the metastatic muscular lesion. I recommended for the patient to continue his current treatment with maintenance Keytruda. We will hold his treatment with Alimta today because of the renal insufficiency. For the lack of appetite he will continue his current treatment with Remeron. The patient will come back for follow-up visit in 3 weeks for evaluation before the next cycle of his treatment. He was advised to call  immediately if he has any concerning symptoms in the interval.  The patient voices understanding of current disease status and treatment options and is in agreement with the current care plan. All questions were answered. The patient knows to call the clinic with any problems, questions or concerns. We can certainly see the patient much sooner if necessary.   Disclaimer: This note was dictated with voice recognition software. Similar sounding words can inadvertently be transcribed and may not be corrected upon review.

## 2020-05-02 NOTE — Progress Notes (Signed)
Per Dr. Julien Nordmann: okay to treat patient based on last CMET.

## 2020-05-02 NOTE — Patient Instructions (Signed)
[facility] Discharge Instructions for Patients Receiving Chemotherapy  Today you received the following chemotherapy agents: pembrolizumab/pemetrexed.  To help prevent nausea and vomiting after your treatment, we encourage you to take your nausea medication as directed.   If you develop nausea and vomiting that is not controlled by your nausea medication, call the clinic.   BELOW ARE SYMPTOMS THAT SHOULD BE REPORTED IMMEDIATELY:  *FEVER GREATER THAN 100.5 F  *CHILLS WITH OR WITHOUT FEVER  NAUSEA AND VOMITING THAT IS NOT CONTROLLED WITH YOUR NAUSEA MEDICATION  *UNUSUAL SHORTNESS OF BREATH  *UNUSUAL BRUISING OR BLEEDING  TENDERNESS IN MOUTH AND THROAT WITH OR WITHOUT PRESENCE OF ULCERS  *URINARY PROBLEMS  *BOWEL PROBLEMS  UNUSUAL RASH Items with * indicate a potential emergency and should be followed up as soon as possible.  Feel free to call the clinic should you have any questions or concerns. The clinic phone number is [phone].  Please show the [place] at check-in to the Emergency Department and triage nurse.

## 2020-05-02 NOTE — Progress Notes (Signed)
Per Cassie Heilingoetter, PA, no alimta today d/t elevated creatinine.

## 2020-05-24 ENCOUNTER — Inpatient Hospital Stay: Payer: Medicare Other

## 2020-05-24 ENCOUNTER — Inpatient Hospital Stay: Payer: Medicare Other | Attending: Internal Medicine | Admitting: Internal Medicine

## 2020-05-24 ENCOUNTER — Other Ambulatory Visit: Payer: Self-pay

## 2020-05-24 VITALS — BP 116/76 | HR 75 | Temp 97.2°F | Resp 18 | Ht 69.0 in | Wt 179.3 lb

## 2020-05-24 DIAGNOSIS — C7989 Secondary malignant neoplasm of other specified sites: Secondary | ICD-10-CM | POA: Diagnosis not present

## 2020-05-24 DIAGNOSIS — C3412 Malignant neoplasm of upper lobe, left bronchus or lung: Secondary | ICD-10-CM | POA: Insufficient documentation

## 2020-05-24 DIAGNOSIS — I10 Essential (primary) hypertension: Secondary | ICD-10-CM

## 2020-05-24 DIAGNOSIS — Z5112 Encounter for antineoplastic immunotherapy: Secondary | ICD-10-CM | POA: Insufficient documentation

## 2020-05-24 DIAGNOSIS — C7971 Secondary malignant neoplasm of right adrenal gland: Secondary | ICD-10-CM | POA: Insufficient documentation

## 2020-05-24 DIAGNOSIS — Z79899 Other long term (current) drug therapy: Secondary | ICD-10-CM | POA: Insufficient documentation

## 2020-05-24 LAB — CBC WITH DIFFERENTIAL (CANCER CENTER ONLY)
Abs Immature Granulocytes: 0.01 10*3/uL (ref 0.00–0.07)
Basophils Absolute: 0 10*3/uL (ref 0.0–0.1)
Basophils Relative: 1 %
Eosinophils Absolute: 0.1 10*3/uL (ref 0.0–0.5)
Eosinophils Relative: 2 %
HCT: 32.7 % — ABNORMAL LOW (ref 39.0–52.0)
Hemoglobin: 10.9 g/dL — ABNORMAL LOW (ref 13.0–17.0)
Immature Granulocytes: 0 %
Lymphocytes Relative: 15 %
Lymphs Abs: 0.6 10*3/uL — ABNORMAL LOW (ref 0.7–4.0)
MCH: 34.1 pg — ABNORMAL HIGH (ref 26.0–34.0)
MCHC: 33.3 g/dL (ref 30.0–36.0)
MCV: 102.2 fL — ABNORMAL HIGH (ref 80.0–100.0)
Monocytes Absolute: 0.5 10*3/uL (ref 0.1–1.0)
Monocytes Relative: 11 %
Neutro Abs: 3.1 10*3/uL (ref 1.7–7.7)
Neutrophils Relative %: 71 %
Platelet Count: 234 10*3/uL (ref 150–400)
RBC: 3.2 MIL/uL — ABNORMAL LOW (ref 4.22–5.81)
RDW: 13.7 % (ref 11.5–15.5)
WBC Count: 4.4 10*3/uL (ref 4.0–10.5)
nRBC: 0 % (ref 0.0–0.2)

## 2020-05-24 LAB — CMP (CANCER CENTER ONLY)
ALT: 26 U/L (ref 0–44)
AST: 20 U/L (ref 15–41)
Albumin: 3.3 g/dL — ABNORMAL LOW (ref 3.5–5.0)
Alkaline Phosphatase: 99 U/L (ref 38–126)
Anion gap: 8 (ref 5–15)
BUN: 30 mg/dL — ABNORMAL HIGH (ref 8–23)
CO2: 24 mmol/L (ref 22–32)
Calcium: 9.8 mg/dL (ref 8.9–10.3)
Chloride: 107 mmol/L (ref 98–111)
Creatinine: 1.71 mg/dL — ABNORMAL HIGH (ref 0.61–1.24)
GFR, Estimated: 41 mL/min — ABNORMAL LOW (ref >60)
Glucose, Bld: 94 mg/dL (ref 70–99)
Potassium: 4.6 mmol/L (ref 3.5–5.1)
Sodium: 139 mmol/L (ref 135–145)
Total Bilirubin: 0.4 mg/dL (ref 0.3–1.2)
Total Protein: 7.4 g/dL (ref 6.5–8.1)

## 2020-05-24 LAB — TSH: TSH: 0.642 u[IU]/mL (ref 0.320–4.118)

## 2020-05-24 MED ORDER — PROCHLORPERAZINE MALEATE 10 MG PO TABS: 10.0000 mg | ORAL_TABLET | Freq: Once | ORAL | Status: DC

## 2020-05-24 MED ORDER — CYANOCOBALAMIN 1000 MCG/ML IJ SOLN
INTRAMUSCULAR | Status: AC
  Filled 2020-05-24: qty 1

## 2020-05-24 MED ORDER — SODIUM CHLORIDE 0.9 % IV SOLN
200.0000 mg | Freq: Once | INTRAVENOUS | Status: AC
  Administered 2020-05-24: 200 mg via INTRAVENOUS
  Filled 2020-05-24: qty 8

## 2020-05-24 MED ORDER — SODIUM CHLORIDE 0.9 % IV SOLN
Freq: Once | INTRAVENOUS | Status: AC
  Filled 2020-05-24: qty 250

## 2020-05-24 MED ORDER — CYANOCOBALAMIN 1000 MCG/ML IJ SOLN: 1000.0000 ug | Freq: Once | INTRAMUSCULAR | Status: DC

## 2020-05-24 MED ORDER — PROCHLORPERAZINE MALEATE 10 MG PO TABS
ORAL_TABLET | ORAL | Status: AC
  Filled 2020-05-24: qty 1

## 2020-05-24 NOTE — Progress Notes (Signed)
Per Dr. Julien Nordmann ok to treat with creatnine of 1.71 today

## 2020-05-24 NOTE — Patient Instructions (Addendum)
[facility] Discharge Instructions for Patients Receiving Chemotherapy  Today you received the following chemotherapy agents Pembrolizumab [facility]).  To help prevent nausea and vomiting after your treatment, we encourage you to take your nausea medication as prescribed.   If you develop nausea and vomiting that is not controlled by your nausea medication, call the clinic.   BELOW ARE SYMPTOMS THAT SHOULD BE REPORTED IMMEDIATELY:  *FEVER GREATER THAN 100.5 F  *CHILLS WITH OR WITHOUT FEVER  NAUSEA AND VOMITING THAT IS NOT CONTROLLED WITH YOUR NAUSEA MEDICATION  *UNUSUAL SHORTNESS OF BREATH  *UNUSUAL BRUISING OR BLEEDING  TENDERNESS IN MOUTH AND THROAT WITH OR WITHOUT PRESENCE OF ULCERS  *URINARY PROBLEMS  *BOWEL PROBLEMS  UNUSUAL RASH Items with * indicate a potential emergency and should be followed up as soon as possible.  Feel free to call the clinic should you have any questions or concerns. The clinic phone number is [phone].  Please show the [place] at check-in to the Emergency Department and triage nurse.

## 2020-05-24 NOTE — Progress Notes (Signed)
[facility] [phone]   Fax:([phone]) [phone]  OFFICE PROGRESS NOTE  [name], MD [address]  [place] [address]  DIAGNOSIS:  Stage IV (T1b, N3, M1c) non-small cell lung cancer, adenocarcinoma. 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  Biomarker Findings Tumor Mutational Burden - 10 Muts/Mb Microsatellite status - MS-Stable Genomic Findings For a complete list of the genes assayed, please refer to the Appendix. ERBB2 amplification - equivocal? FXO32 N19* FANCC splice site 166+0A>Y SMAD4 Q289* TP53 E298* 7 Disease relevant genes with no reportable alterations: ALK, BRAF, EGFR, KRAS, MET, RET, ROS1   PDL1 expression is 30%.  PRIOR THERAPY: 1) Weekly concurrent chemoradiation with carboplatin for an AUC of 2 and paclitaxel 45 mg/m2 to the locally advanced disease in the chest. He started the first dose 01/31/2019. Status post7cycles.Last dose of chemotherapy was given on March 14, 2019. 2) palliative radiotherapy to the metastatic disease in the right adrenal gland. 3) palliative radiotherapy to the metastatic deposit in the right obturator externus and abductor magnus under the care of Dr. [name].  CURRENT THERAPY: Systemic chemotherapy with carboplatin for AUC of 5, Alimta 500 mg/M2 and Keytruda 200 mg IV every 3 weeks. First dose April 20, 2019. Status post 19 cycles.   Starting from cycle #5, the patient is on maintenance treatment with Alimta and Keytruda every 3 weeks.  INTERVAL HISTORY: Trevor Bailey 75 y.o. male returns to the clinic today for follow-up visit.  The patient is feeling fine today with no concerning complaints. He has been tolerating his treatment with maintenance Alimta and Keytruda fairly well.  He denied having any  current chest pain, shortness of breath, cough or hemoptysis.  He denied having any fever or chills.  He has no nausea, vomiting, diarrhea or constipation.  He has no headache or visual changes.  He has been taking ibuprofen daily for shoulder pain.  The patient is here today for evaluation before starting cycle #20.  MEDICAL HISTORY: Past Medical History:  Diagnosis Date  . Adenopathy    left supraclavicular lymph node  . Allergies   . COPD (chronic obstructive pulmonary disease) ([employer])   . Coronary disease   . Diabetes ([place])   . Enlarged prostate   . GERD (gastroesophageal reflux disease)   . Glaucoma   . Hyperlipidemia   . Hypertension   . Myocardial infarction ([place])   . nscl ca dx'd 12/2018  . Sleep apnea    wears CPAP  . Wears partial dentures    upper and lower    ALLERGIES:  has No Known Allergies.  MEDICATIONS:  Current Outpatient Medications  Medication Sig Dispense Refill  . ACCU-CHEK GUIDE test strip     . amLODipine (NORVASC) 5 MG tablet TAKE 1 TABLET BY MOUTH  DAILY 90 tablet 3  . aspirin EC 81 MG tablet Take 81 mg by mouth daily.    Marland Kitchen atorvastatin (LIPITOR) 80 MG tablet Take 80 mg by mouth daily.    Marland Kitchen augmented betamethasone dipropionate (DIPROLENE-AF) 0.05 % cream     . CLARITIN 10 MG tablet Take 10 mg by mouth daily.     . clopidogrel (PLAVIX) 75 MG tablet Take 1 tablet (75 mg total) by mouth every evening. 30 tablet 0  . ezetimibe (ZETIA) 10 MG tablet Take 10 mg  by mouth daily.    . finasteride (PROSCAR) 5 MG tablet Take 5 mg by mouth daily.     . folic acid (FOLVITE) 1 MG tablet TAKE 1 TABLET BY MOUTH  DAILY 90 tablet 3  . hydrochlorothiazide (HYDRODIURIL) 25 MG tablet Take 25 mg by mouth daily.     Marland Kitchen latanoprost (XALATAN) 0.005 % ophthalmic solution Place 1 drop into both eyes at bedtime.     . metFORMIN (GLUCOPHAGE) 1000 MG tablet Take 1,000 mg by mouth 2 (two) times daily.     . metoprolol tartrate (LOPRESSOR) 25 MG tablet Take 25 mg 2 (two) times daily by  mouth.    . mirtazapine (REMERON) 30 MG tablet TAKE 1 TABLET BY MOUTH AT  BEDTIME 90 tablet 3  . Multiple Vitamin (MULTIVITAMIN WITH MINERALS) TABS tablet Take 1 tablet by mouth daily. Centrum Silver    . nitroGLYCERIN (NITROSTAT) 0.4 MG SL tablet Place 1 tablet (0.4 mg total) under the tongue every 5 (five) minutes as needed for chest pain. 25 tablet 6  . ofloxacin (FLOXIN) 0.3 % OTIC solution     . OVER THE COUNTER MEDICATION Take 1 tablet by mouth daily as needed. Colace    . pantoprazole (PROTONIX) 40 MG tablet Take 40 mg daily by mouth.    Vladimir Faster Glycol-Propyl Glycol (SYSTANE) 0.4-0.3 % SOLN Place 1-2 drops into both eyes 3 (three) times daily as needed (dry/irritated eyes.).    Marland Kitchen prochlorperazine (COMPAZINE) 10 MG tablet Take 10 mg by mouth daily as needed for nausea or vomiting.    . ramipril (ALTACE) 10 MG capsule Take 10 mg by mouth 2 (two) times daily.     Marland Kitchen Respiratory Therapy Supplies (CARETOUCH 2 CPAP HOSE HANGER) MISC See admin instructions.     No current facility-administered medications for this visit.    SURGICAL HISTORY:  Past Surgical History:  Procedure Laterality Date  . ABDOMINAL AORTAGRAM Left 09/16/2013   Procedure: ABDOMINAL Maxcine Ham;  Surgeon: Elam Dutch, MD;  Location: [facility] CATH LAB;  Service: Cardiovascular;  Laterality: Left;  . CARDIAC CATHETERIZATION  01/2011   When there was segmental stenosis of the distal RCA, patent PCA stent, patent circumflex stent, and a patent small diagonal with 90% ISR.   Marland Kitchen COLONOSCOPY W/ BIOPSIES AND POLYPECTOMY    . CORONARY ANGIOPLASTY  may 2002   Non-DES stenting of his circumflex, non-DES stenting of the RCA  . HYDROCELE EXCISION / REPAIR  2009   by Dr Janice Norrie  . INGUINAL HERNIA REPAIR Bilateral    Dr Bubba Camp  . LYMPH NODE BIOPSY Left 01/10/2019   Procedure: left supraclavicular LYMPH NODE BIOPSY;  Surgeon: [name], MD;  Location: [facility];  Service: Thoracic;  Laterality: Left;  Marland Kitchen MULTIPLE TOOTH EXTRACTIONS     . NM MYOCAR PERF WALL MOTION  01/08/2011   moderate in size and intensity area of reversible ischemia in the basal to mid inferior and septal territories. Abnormal study  . stents  2008   proximal RCA, DES for progession of disease.  Marland Kitchen US ECHOCARDIOGRAPHY  01/12/2012   mild concentric LVH, borderline LA enlargement, mild to mod TR    REVIEW OF SYSTEMS:  A comprehensive review of systems was negative.   PHYSICAL EXAMINATION: General appearance: alert, cooperative and no distress Head: Normocephalic, without obvious abnormality, atraumatic Neck: no adenopathy, no JVD, supple, symmetrical, trachea midline and thyroid not enlarged, symmetric, no tenderness/mass/nodules Lymph nodes: Cervical, supraclavicular, and axillary nodes normal. Resp: clear to auscultation bilaterally Back:  symmetric, no curvature. ROM normal. No CVA tenderness. Cardio: regular rate and rhythm, S1, S2 normal, no murmur, click, rub or gallop GI: soft, non-tender; bowel sounds normal; no masses,  no organomegaly Extremities: extremities normal, atraumatic, no cyanosis or edema  ECOG PERFORMANCE STATUS: 1 - Symptomatic but completely ambulatory  Blood pressure 116/76, pulse 75, temperature (!) 97.2 F (36.2 C), temperature source Tympanic, resp. rate 18, height $RemoveBe'5\' 9"'paYpYDhKK$  (1.753 m), weight 179 lb 4.8 oz (81.3 kg), SpO2 100 %.  LABORATORY DATA: Lab Results  Component Value Date   WBC 4.4 05/24/2020   HGB 10.9 (L) 05/24/2020   HCT 32.7 (L) 05/24/2020   MCV 102.2 (H) 05/24/2020   PLT 234 05/24/2020      Chemistry      Component Value Date/Time   NA 141 05/02/2020 1119   K 4.3 05/02/2020 1119   CL 107 05/02/2020 1119   CO2 25 05/02/2020 1119   BUN 30 (H) 05/02/2020 1119   CREATININE 1.93 (H) 05/02/2020 1119   CREATININE 0.94 05/16/2014 1109      Component Value Date/Time   CALCIUM 10.5 (H) 05/02/2020 1119   ALKPHOS 112 05/02/2020 1119   AST 31 05/02/2020 1119   ALT 39 05/02/2020 1119   BILITOT 0.3 05/02/2020  1119       RADIOGRAPHIC STUDIES: CT Abdomen Pelvis Wo Contrast  Result Date: 04/30/2020 CLINICAL DATA:  Non-small cell lung cancer staging in a 75 year old male. EXAM: CT CHEST, ABDOMEN AND PELVIS WITHOUT CONTRAST TECHNIQUE: Multidetector CT imaging of the chest, abdomen and pelvis was performed following the standard protocol without IV contrast. COMPARISON:  February 28, 2020 FINDINGS: CT CHEST FINDINGS Cardiovascular: Calcified atheromatous plaque of the thoracic aorta. Three-vessel coronary artery disease. Heart size normal without pericardial effusion. Central pulmonary vasculature stable appearance in terms of caliber. Limited assessment of cardiovascular structures given lack of intravenous contrast. Mediastinum/Nodes: No thoracic inlet adenopathy. No axillary lymphadenopathy. No gross hilar lymphadenopathy. Esophagus grossly normal. Lungs/Pleura: Increasing confluence of scarring/post treatment changes in the LEFT upper lobe. Further volume loss obscures the nodular area discussed on the previous examination in this location. Interval development of a very small LEFT-sided pleural effusion, associated with pleural calcification. Effusion is new. Calcification was present previously. Subtle ground-glass focus along major fissure in the LEFT chest in the superior segment of LEFT lower lobe measures 6 mm (image 40, series 4) airways are patent. Musculoskeletal: Spinal degenerative changes. No acute or destructive bone process. See below for full musculoskeletal details. CT ABDOMEN PELVIS FINDINGS Hepatobiliary: Cysts in the liver. The no focal, suspicious hepatic lesion on noncontrast imaging. No pericholecystic stranding. No gross biliary duct dilation. Pancreas: Normal contour.  No sign of inflammation. Spleen: Normal in size and contour. Adrenals/Urinary Tract: Normal adrenal glands. Low-density lesion arising from the posterior RIGHT kidney unchanged likely a cyst. No hydronephrosis. No  nephrolithiasis. Stomach/Bowel: Stomach without signs of acute process. Small bowel is normal caliber. The appendix is normal. Colonic diverticulosis.  No sign of diverticulitis. Vascular/Lymphatic: Aortic atherosclerosis. No aneurysmal dilation. There is no gastrohepatic or hepatoduodenal ligament lymphadenopathy. No retroperitoneal or mesenteric lymphadenopathy. No pelvic sidewall lymphadenopathy. Reproductive: Prostate grossly normal on CT. Signs of calcification of the gland and mild enlargement as before. Other: No ascites.  Small fat containing umbilical hernia. Musculoskeletal: Low attenuation in the RIGHT abductor magnus with less expansion of the muscle than on the prior exam, with respect imaged portions now measuring 2 cm the level of the RIGHT lesser trochanter, at a similar location on  the prior study measuring approximately 3.8 cm. Spinal degenerative changes. Heterogeneous appearance of the L2 vertebral body and posterior elements with no change. IMPRESSION: 1. Increasing confluence of scarring/post treatment changes in the LEFT upper lobe. Further volume loss obscures the nodular area discussed on the previous examination in this location. Attention on follow-up. 2. Small LEFT-sided pleural effusion new when compared to the prior study. Attention on follow-up. Pleural calcification with similar appearance. 3. Subtle ground-glass focus along the major fissure in the LEFT chest in the superior segment of LEFT lower lobe measures 6 this may be related to post treatment change, attention on follow-up is suggested. 4. No signs of metastatic disease in the abdomen or pelvis. 5. Decreasing thickness of ill-defined area of hypodensity compatible with intramuscular metastatic lesion, area is incompletely imaged but appears improved with respect imaged portions. 6. Three-vessel coronary artery disease. 7. Aortic atherosclerosis. Aortic Atherosclerosis (ICD10-I70.0). Electronically Signed   By: Zetta Bills  M.D.   On: 04/30/2020 09:46   CT Chest Wo Contrast  Result Date: 04/30/2020 CLINICAL DATA:  Non-small cell lung cancer staging in a 75 year old male. EXAM: CT CHEST, ABDOMEN AND PELVIS WITHOUT CONTRAST TECHNIQUE: Multidetector CT imaging of the chest, abdomen and pelvis was performed following the standard protocol without IV contrast. COMPARISON:  February 28, 2020 FINDINGS: CT CHEST FINDINGS Cardiovascular: Calcified atheromatous plaque of the thoracic aorta. Three-vessel coronary artery disease. Heart size normal without pericardial effusion. Central pulmonary vasculature stable appearance in terms of caliber. Limited assessment of cardiovascular structures given lack of intravenous contrast. Mediastinum/Nodes: No thoracic inlet adenopathy. No axillary lymphadenopathy. No gross hilar lymphadenopathy. Esophagus grossly normal. Lungs/Pleura: Increasing confluence of scarring/post treatment changes in the LEFT upper lobe. Further volume loss obscures the nodular area discussed on the previous examination in this location. Interval development of a very small LEFT-sided pleural effusion, associated with pleural calcification. Effusion is new. Calcification was present previously. Subtle ground-glass focus along major fissure in the LEFT chest in the superior segment of LEFT lower lobe measures 6 mm (image 40, series 4) airways are patent. Musculoskeletal: Spinal degenerative changes. No acute or destructive bone process. See below for full musculoskeletal details. CT ABDOMEN PELVIS FINDINGS Hepatobiliary: Cysts in the liver. The no focal, suspicious hepatic lesion on noncontrast imaging. No pericholecystic stranding. No gross biliary duct dilation. Pancreas: Normal contour.  No sign of inflammation. Spleen: Normal in size and contour. Adrenals/Urinary Tract: Normal adrenal glands. Low-density lesion arising from the posterior RIGHT kidney unchanged likely a cyst. No hydronephrosis. No nephrolithiasis.  Stomach/Bowel: Stomach without signs of acute process. Small bowel is normal caliber. The appendix is normal. Colonic diverticulosis.  No sign of diverticulitis. Vascular/Lymphatic: Aortic atherosclerosis. No aneurysmal dilation. There is no gastrohepatic or hepatoduodenal ligament lymphadenopathy. No retroperitoneal or mesenteric lymphadenopathy. No pelvic sidewall lymphadenopathy. Reproductive: Prostate grossly normal on CT. Signs of calcification of the gland and mild enlargement as before. Other: No ascites.  Small fat containing umbilical hernia. Musculoskeletal: Low attenuation in the RIGHT abductor magnus with less expansion of the muscle than on the prior exam, with respect imaged portions now measuring 2 cm the level of the RIGHT lesser trochanter, at a similar location on the prior study measuring approximately 3.8 cm. Spinal degenerative changes. Heterogeneous appearance of the L2 vertebral body and posterior elements with no change. IMPRESSION: 1. Increasing confluence of scarring/post treatment changes in the LEFT upper lobe. Further volume loss obscures the nodular area discussed on the previous examination in this location. Attention on follow-up. 2. Small  LEFT-sided pleural effusion new when compared to the prior study. Attention on follow-up. Pleural calcification with similar appearance. 3. Subtle ground-glass focus along the major fissure in the LEFT chest in the superior segment of LEFT lower lobe measures 6 this may be related to post treatment change, attention on follow-up is suggested. 4. No signs of metastatic disease in the abdomen or pelvis. 5. Decreasing thickness of ill-defined area of hypodensity compatible with intramuscular metastatic lesion, area is incompletely imaged but appears improved with respect imaged portions. 6. Three-vessel coronary artery disease. 7. Aortic atherosclerosis. Aortic Atherosclerosis (ICD10-I70.0). Electronically Signed   By: Zetta Bills M.D.   On:  04/30/2020 09:46    ASSESSMENT AND PLAN: This is a very pleasant 75 years old African-American male with stage IV non-small cell lung cancer, adenocarcinoma with no actionable mutation and PD-L1 expression of 30%, presented with locally advanced disease in the chest as well as solitary metastasis to the right adrenal gland. The patient completed a course of concurrent chemoradiation to the chest with weekly carboplatin for AUC of 2 and paclitaxel 45 mg/M2 status post 7 cycles..  The patient had evidence for disease progression and he was started on systemic chemotherapy with carboplatin, Alimta and Keytruda for 4 cycles and he is currently on maintenance treatment with Alimta and Keytruda every 3 weeks status post 15 more cycles. The patient has been tolerating this treatment fairly well.  He has been on treatment with Keytruda only the last few cycles because of renal insufficiency. If he has improvement in his renal function, I will resume his treatment with Alimta in addition to [facility], otherwise he will continue on single agent Keytruda for now. The patient will come back for follow-up visit in 3 weeks for evaluation before the next cycle of his treatment. I also strongly recommend for the patient to cut on the doses of ibuprofen because of the renal insufficiency. For the lack of appetite he will continue his current treatment with Remeron. He was advised to call immediately if he has any concerning symptoms in the interval.  The patient voices understanding of current disease status and treatment options and is in agreement with the current care plan. All questions were answered. The patient knows to call the clinic with any problems, questions or concerns. We can certainly see the patient much sooner if necessary.   Disclaimer: This note was dictated with voice recognition software. Similar sounding words can inadvertently be transcribed and may not be corrected upon review.

## 2020-05-29 ENCOUNTER — Telehealth: Payer: Self-pay | Admitting: Internal Medicine

## 2020-05-29 NOTE — Telephone Encounter (Signed)
Scheduled per 1/6 los. Pt will receive an updated appt calendar per next visit appt notes

## 2020-06-08 NOTE — Progress Notes (Signed)
[facility] OFFICE PROGRESS NOTE  [name], MD [place] [address] [place] [address]  DIAGNOSIS: Stage IV (T1b, N3,M1c) non-small cell lung cancer, adenocarcinoma. 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  Biomarker Findings Tumor Mutational Burden - 10 Muts/Mb Microsatellite status - MS-Stable Genomic Findings For a complete list of the genes assayed, please refer to the Appendix. ERBB2 amplification - equivocal? [id number] S85* FANCC splice site [id number] SMAD4 Q289* TP53 E298* 7 Disease relevant genes with no reportable alterations: ALK, BRAF, EGFR, KRAS, MET, RET, ROS1   PDL1 expression is 30%.  PRIOR THERAPY: 1) Weekly concurrent chemoradiation with carboplatin for an AUC of 2 and paclitaxel 45 mg/m2 to the locally advanced disease in the chest. He started the first dose 01/31/2019. Status post7cycles.Last dose of chemotherapy was given on March 14, 2019. 2) palliative radiotherapy to the metastatic disease in the right adrenal gland. 3) Palliative radiotherapy to the enlarging muscular metastasis in the right hipwithin the right adductor muscle, medial to the lesser trochanter of the femur under the care of Dr. [name]. First day of radiation9/13/21  CURRENT THERAPY: Systemic chemotherapy with carboplatin for AUC of 5, Alimta 500 mg/M2 and Keytruda 200 mg IV every 3 weeks. First dose April 20, 2019. Status post20cycles.Starting from cycle #5, the patient began maintenance Alimta and Keytruda. His Alimta has been on hold since cycle #19 due to renal insuffiencey.  INTERVAL HISTORY: Trevor Bailey 75 y.o. male returns to the clinic for a follow up visit. The patient is feeling well today without any concerning complaints except for he needs a tooth extraction and states his dental  office needs clearance.  He tolerated his last cycle of treatment well without any adverse side effects. His Alimta has been held for a few cycles due to renal insufficiency. Today, he denies fever, chills, or night sweats. His weight is stable. He saw a member of the nutritionist team in the past.Denies any chest pain, shortness of breath, or hemoptysis. He reports a little bit of a dry cough secondary to post nasal drainage. Denies any nausea, vomiting, diarrhea, or constipation. Denies any headache or visual changes. Denies any rashes or skin changes. The patient is here today for evaluation prior to starting cycle #21   MEDICAL HISTORY: Past Medical History:  Diagnosis Date  . Adenopathy    left supraclavicular lymph node  . Allergies   . COPD (chronic obstructive pulmonary disease) ([place])   . Coronary disease   . Diabetes ([place])   . Enlarged prostate   . GERD (gastroesophageal reflux disease)   . Glaucoma   . Hyperlipidemia   . Hypertension   . Myocardial infarction ([place])   . nscl ca dx'd 12/2018  . Sleep apnea    wears CPAP  . Wears partial dentures    upper and lower    ALLERGIES:  has No Known Allergies.  MEDICATIONS:  Current Outpatient Medications  Medication Sig Dispense Refill  . ACCU-CHEK GUIDE test strip     . albuterol (VENTOLIN HFA) 108 (90 Base) MCG/ACT inhaler     . amLODipine (NORVASC) 5 MG tablet TAKE 1 TABLET BY MOUTH  DAILY 90 tablet 3  . aspirin EC 81 MG tablet Take 81 mg by mouth daily.    Marland Kitchen atorvastatin (LIPITOR) 80 MG tablet Take 80 mg by mouth daily.    Marland Kitchen  augmented betamethasone dipropionate (DIPROLENE-AF) 0.05 % cream     . CLARITIN 10 MG tablet Take 10 mg by mouth daily.     . clopidogrel (PLAVIX) 75 MG tablet Take 1 tablet (75 mg total) by mouth every evening. 30 tablet 0  . ezetimibe (ZETIA) 10 MG tablet Take 10 mg by mouth daily.    . finasteride (PROSCAR) 5 MG tablet Take 5 mg by mouth daily.     . folic acid (FOLVITE) 1 MG tablet TAKE 1 TABLET  BY MOUTH  DAILY 90 tablet 3  . hydrochlorothiazide (HYDRODIURIL) 25 MG tablet Take 25 mg by mouth daily.     Marland Kitchen latanoprost (XALATAN) 0.005 % ophthalmic solution Place 1 drop into both eyes at bedtime.     . metFORMIN (GLUCOPHAGE) 1000 MG tablet Take 1,000 mg by mouth 2 (two) times daily.     . metoprolol tartrate (LOPRESSOR) 25 MG tablet Take 25 mg 2 (two) times daily by mouth.    . mirtazapine (REMERON) 30 MG tablet TAKE 1 TABLET BY MOUTH AT  BEDTIME 90 tablet 3  . Multiple Vitamin (MULTIVITAMIN WITH MINERALS) TABS tablet Take 1 tablet by mouth daily. Centrum Silver    . nitroGLYCERIN (NITROSTAT) 0.4 MG SL tablet Place 1 tablet (0.4 mg total) under the tongue every 5 (five) minutes as needed for chest pain. 25 tablet 6  . ofloxacin (FLOXIN) 0.3 % OTIC solution     . OVER THE COUNTER MEDICATION Take 1 tablet by mouth daily as needed. Colace    . pantoprazole (PROTONIX) 40 MG tablet Take 40 mg daily by mouth.    Vladimir Faster Glycol-Propyl Glycol (SYSTANE) 0.4-0.3 % SOLN Place 1-2 drops into both eyes 3 (three) times daily as needed (dry/irritated eyes.).    Marland Kitchen prochlorperazine (COMPAZINE) 10 MG tablet Take 10 mg by mouth daily as needed for nausea or vomiting.    . ramipril (ALTACE) 10 MG capsule Take 10 mg by mouth 2 (two) times daily.     Marland Kitchen Respiratory Therapy Supplies (CARETOUCH 2 CPAP HOSE HANGER) MISC See admin instructions.     No current facility-administered medications for this visit.    SURGICAL HISTORY:  Past Surgical History:  Procedure Laterality Date  . ABDOMINAL AORTAGRAM Left 09/16/2013   Procedure: ABDOMINAL Maxcine Ham;  Surgeon: Elam Dutch, MD;  Location: [facility] CATH LAB;  Service: Cardiovascular;  Laterality: Left;  . CARDIAC CATHETERIZATION  01/2011   When there was segmental stenosis of the distal RCA, patent PCA stent, patent circumflex stent, and a patent small diagonal with 90% ISR.   Marland Kitchen COLONOSCOPY W/ BIOPSIES AND POLYPECTOMY    . CORONARY ANGIOPLASTY  may 2002    Non-DES stenting of his circumflex, non-DES stenting of the RCA  . HYDROCELE EXCISION / REPAIR  2009   by Dr Janice Norrie  . INGUINAL HERNIA REPAIR Bilateral    Dr Bubba Camp  . LYMPH NODE BIOPSY Left 01/10/2019   Procedure: left supraclavicular LYMPH NODE BIOPSY;  Surgeon: [name], MD;  Location: [facility];  Service: Thoracic;  Laterality: Left;  Marland Kitchen MULTIPLE TOOTH EXTRACTIONS    . NM MYOCAR PERF WALL MOTION  01/08/2011   moderate in size and intensity area of reversible ischemia in the basal to mid inferior and septal territories. Abnormal study  . stents  2008   proximal RCA, DES for progession of disease.  Marland Kitchen US ECHOCARDIOGRAPHY  01/12/2012   mild concentric LVH, borderline LA enlargement, mild to mod TR    REVIEW OF SYSTEMS:  Review of Systems  Constitutional: Negative for appetite change, chills, fatigue, fever and unexpected weight change.  HENT: Positive for infected tooth. Negative for mouth sores, nosebleeds, sore throat and trouble swallowing.   Eyes: Negative for eye problems and icterus.  Respiratory: Negative for cough, hemoptysis, shortness of breath and wheezing.   Cardiovascular: Negative for chest pain and leg swelling.  Gastrointestinal: Negative for abdominal pain, constipation, diarrhea, nausea and vomiting.  Genitourinary: Negative for bladder incontinence, difficulty urinating, dysuria, frequency and hematuria.   Musculoskeletal: Negative for back pain, gait problem, neck pain and neck stiffness.  Skin: Negative for itching and rash.  Neurological: Negative for dizziness, extremity weakness, gait problem, headaches, light-headedness and seizures.  Hematological: Negative for adenopathy. Does not bruise/bleed easily.  Psychiatric/Behavioral: Negative for confusion, depression and sleep disturbance. The patient is not nervous/anxious.     PHYSICAL EXAMINATION:  Blood pressure 127/82, pulse 79, temperature 97.8 F (36.6 C), temperature source Tympanic, resp. rate 18,  height 5\' 9"  (1.753 m), weight 180 lb 4.8 oz (81.8 kg), SpO2 100 %.  ECOG PERFORMANCE STATUS: 1 - Symptomatic but completely ambulatory  Physical Exam  Constitutional: Oriented to person, place, and time and well-developed, well-nourished, and in no distress. No distress.  HENT:  Head: Normocephalic and atraumatic.  Mouth/Throat: Positive for infected upper tooth. Oropharynx is clear and moist. No oropharyngeal exudate.  Eyes: Conjunctivae are normal. Right eye exhibits no discharge. Left eye exhibits no discharge. No scleral icterus.  Neck: Normal range of motion. Neck supple.  Cardiovascular: Normal rate, regular rhythm, normal heart sounds and intact distal pulses.   Pulmonary/Chest: Effort normal and breath sounds normal. No respiratory distress. No wheezes. No rales.  Abdominal: Soft. Bowel sounds are normal. Exhibits no distension and no mass. There is no tenderness.  Musculoskeletal: Normal range of motion. Exhibits no edema.  Lymphadenopathy:    No cervical adenopathy.  Neurological: Alert and oriented to person, place, and time. Exhibits normal muscle tone. Gait normal. Coordination normal.  Skin: Skin is warm and dry. No rash noted. Not diaphoretic. No erythema. No pallor.  Psychiatric: Mood, memory and judgment normal.  Vitals reviewed.  LABORATORY DATA: Lab Results  Component Value Date   WBC 4.2 06/13/2020   HGB 11.1 (L) 06/13/2020   HCT 33.6 (L) 06/13/2020   MCV 100.9 (H) 06/13/2020   PLT 218 06/13/2020      Chemistry      Component Value Date/Time   NA 139 06/13/2020 0758   K 4.2 06/13/2020 0758   CL 103 06/13/2020 0758   CO2 25 06/13/2020 0758   BUN 31 (H) 06/13/2020 0758   CREATININE 1.69 (H) 06/13/2020 0758   CREATININE 0.94 05/16/2014 1109      Component Value Date/Time   CALCIUM 9.8 06/13/2020 0758   ALKPHOS 105 06/13/2020 0758   AST 25 06/13/2020 0758   ALT 38 06/13/2020 0758   BILITOT 0.2 (L) 06/13/2020 0758       RADIOGRAPHIC STUDIES:  No  results found.   ASSESSMENT/PLAN:  This is a very pleasant 75 year old African-American male with stage IV non-small cell lung cancer, adenocarcinoma with no actionable mutation and PD-L1 expression of 30%,presented with locally advanced disease in the chest as well as solitary metastasis to the right adrenal gland.He has no actionable mutations.  The patientcompleted a course of concurrent chemoradiation to the chest with weekly carboplatin for AUC of 2 and paclitaxel 45 mg/M2 status post7cycles.Last dose of chemotherapy was given on March 14, 2019. He also  completed palliative radiotherapy to the right adrenal gland lesion under the care of Dr. [name].  He also completed palliative radiotherapy to the muscular metastasis in the right hip in September 2021.   He is currentlyundergoing systemic chemotherapy with carboplatin for an AUC of 5, Alimta 500 mg per metered squared and Keytruda 200 mg IV every 3 weeks. He is status post20 cycles. He tolerated treatment well without any concerning adverse side effects except for mild fatigue.Starting from cycle #5,he has been on maintenance treatment with Alimta and Keytruda. Alimta has been on hold since cycle #19 due to renal insufficiency.   Labs were reviewed. His creatinine today is 1.69. Due to his elevated creatinine and need for a dental extraction, we recommend that he proceed with cycle #21 today as scheduled with immunotherapy with Keytruda only.   We will fax his dental clearance form to his dentist. I consulted Dr. Julien Nordmann. Per Dr. Julien Nordmann it is ok to have the dental extraction. No special considerations besides the normal protocol/treatment are required. Since he is not receiving chemotherapy today, he can have his dental extraction at anytime.    We will see him back for a follow up visit in 3 weeks for evaluation before starting cycle #22.   The patient was advised to call immediately if he has any concerning symptoms in the  interval. The patient voices understanding of current disease status and treatment options and is in agreement with the current care plan. All questions were answered. The patient knows to call the clinic with any problems, questions or concerns. We can certainly see the patient much sooner if necessary        No orders of the defined types were placed in this encounter.    I spent 20-29 minutes for this encounter  Monty Spicher L Asbury Hair, PA-C 06/13/20

## 2020-06-13 ENCOUNTER — Inpatient Hospital Stay: Payer: Medicare Other | Admitting: Physician Assistant

## 2020-06-13 ENCOUNTER — Ambulatory Visit: Payer: Medicare Other

## 2020-06-13 ENCOUNTER — Other Ambulatory Visit: Payer: Self-pay

## 2020-06-13 ENCOUNTER — Inpatient Hospital Stay: Payer: Medicare Other

## 2020-06-13 VITALS — BP 127/82 | HR 79 | Temp 97.8°F | Resp 18 | Ht 69.0 in | Wt 180.3 lb

## 2020-06-13 DIAGNOSIS — Z5112 Encounter for antineoplastic immunotherapy: Secondary | ICD-10-CM

## 2020-06-13 DIAGNOSIS — C3412 Malignant neoplasm of upper lobe, left bronchus or lung: Secondary | ICD-10-CM

## 2020-06-13 DIAGNOSIS — C7971 Secondary malignant neoplasm of right adrenal gland: Secondary | ICD-10-CM | POA: Diagnosis not present

## 2020-06-13 DIAGNOSIS — C7989 Secondary malignant neoplasm of other specified sites: Secondary | ICD-10-CM | POA: Diagnosis not present

## 2020-06-13 DIAGNOSIS — Z79899 Other long term (current) drug therapy: Secondary | ICD-10-CM | POA: Diagnosis not present

## 2020-06-13 LAB — CMP (CANCER CENTER ONLY)
ALT: 38 U/L (ref 0–44)
AST: 25 U/L (ref 15–41)
Albumin: 3.3 g/dL — ABNORMAL LOW (ref 3.5–5.0)
Alkaline Phosphatase: 105 U/L (ref 38–126)
Anion gap: 11 (ref 5–15)
BUN: 31 mg/dL — ABNORMAL HIGH (ref 8–23)
CO2: 25 mmol/L (ref 22–32)
Calcium: 9.8 mg/dL (ref 8.9–10.3)
Chloride: 103 mmol/L (ref 98–111)
Creatinine: 1.69 mg/dL — ABNORMAL HIGH (ref 0.61–1.24)
GFR, Estimated: 42 mL/min — ABNORMAL LOW (ref >60)
Glucose, Bld: 131 mg/dL — ABNORMAL HIGH (ref 70–99)
Potassium: 4.2 mmol/L (ref 3.5–5.1)
Sodium: 139 mmol/L (ref 135–145)
Total Bilirubin: 0.2 mg/dL — ABNORMAL LOW (ref 0.3–1.2)
Total Protein: 7.5 g/dL (ref 6.5–8.1)

## 2020-06-13 LAB — CBC WITH DIFFERENTIAL (CANCER CENTER ONLY)
Abs Immature Granulocytes: 0.01 10*3/uL (ref 0.00–0.07)
Basophils Absolute: 0 10*3/uL (ref 0.0–0.1)
Basophils Relative: 1 %
Eosinophils Absolute: 0.2 10*3/uL (ref 0.0–0.5)
Eosinophils Relative: 4 %
HCT: 33.6 % — ABNORMAL LOW (ref 39.0–52.0)
Hemoglobin: 11.1 g/dL — ABNORMAL LOW (ref 13.0–17.0)
Immature Granulocytes: 0 %
Lymphocytes Relative: 29 %
Lymphs Abs: 1.2 10*3/uL (ref 0.7–4.0)
MCH: 33.3 pg (ref 26.0–34.0)
MCHC: 33 g/dL (ref 30.0–36.0)
MCV: 100.9 fL — ABNORMAL HIGH (ref 80.0–100.0)
Monocytes Absolute: 0.6 10*3/uL (ref 0.1–1.0)
Monocytes Relative: 13 %
Neutro Abs: 2.2 10*3/uL (ref 1.7–7.7)
Neutrophils Relative %: 53 %
Platelet Count: 218 10*3/uL (ref 150–400)
RBC: 3.33 MIL/uL — ABNORMAL LOW (ref 4.22–5.81)
RDW: 12.8 % (ref 11.5–15.5)
WBC Count: 4.2 10*3/uL (ref 4.0–10.5)
nRBC: 0 % (ref 0.0–0.2)

## 2020-06-13 LAB — TSH: TSH: 1.042 u[IU]/mL (ref 0.320–4.118)

## 2020-06-13 MED ORDER — PROCHLORPERAZINE MALEATE 10 MG PO TABS
10.0000 mg | ORAL_TABLET | Freq: Once | ORAL | Status: AC
  Administered 2020-06-13: 10 mg via ORAL

## 2020-06-13 MED ORDER — SODIUM CHLORIDE 0.9 % IV SOLN
Freq: Once | INTRAVENOUS | Status: AC
  Filled 2020-06-13: qty 250

## 2020-06-13 MED ORDER — PROCHLORPERAZINE MALEATE 10 MG PO TABS
ORAL_TABLET | ORAL | Status: AC
  Filled 2020-06-13: qty 1

## 2020-06-13 MED ORDER — SODIUM CHLORIDE 0.9 % IV SOLN
200.0000 mg | Freq: Once | INTRAVENOUS | Status: AC
  Administered 2020-06-13: 200 mg via INTRAVENOUS
  Filled 2020-06-13: qty 8

## 2020-06-13 NOTE — Patient Instructions (Signed)
[facility] Discharge Instructions for Patients Receiving Chemotherapy  Today you received the following chemotherapy agents Pembrolizumab [facility]).  To help prevent nausea and vomiting after your treatment, we encourage you to take your nausea medication as prescribed.   If you develop nausea and vomiting that is not controlled by your nausea medication, call the clinic.   BELOW ARE SYMPTOMS THAT SHOULD BE REPORTED IMMEDIATELY:  *FEVER GREATER THAN 100.5 F  *CHILLS WITH OR WITHOUT FEVER  NAUSEA AND VOMITING THAT IS NOT CONTROLLED WITH YOUR NAUSEA MEDICATION  *UNUSUAL SHORTNESS OF BREATH  *UNUSUAL BRUISING OR BLEEDING  TENDERNESS IN MOUTH AND THROAT WITH OR WITHOUT PRESENCE OF ULCERS  *URINARY PROBLEMS  *BOWEL PROBLEMS  UNUSUAL RASH Items with * indicate a potential emergency and should be followed up as soon as possible.  Feel free to call the clinic should you have any questions or concerns. The clinic phone number is [phone].  Please show the [facility] at check-in to the Emergency Department and triage nurse.

## 2020-06-14 ENCOUNTER — Telehealth: Payer: Self-pay | Admitting: Physician Assistant

## 2020-06-14 NOTE — Telephone Encounter (Signed)
Scheduled appointments per 1/26 los. Will have updated calendar printed for patient at next visit.

## 2020-06-29 NOTE — Progress Notes (Signed)
[facility] OFFICE PROGRESS NOTE  Trevor Carol, MD [address] [name] [address]  DIAGNOSIS: Stage IV (T1b, N3,M1c) non-small cell lung cancer, adenocarcinoma. 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  Biomarker Findings Tumor Mutational Burden - 10 Muts/Mb Microsatellite status - MS-Stable Genomic Findings For a complete list of the genes assayed, please refer to the Appendix. ERBB2 amplification - equivocal? CNO70 J62* FANCC splice site [id number]>H SMAD4 Q289* TP53 E298* 7 Disease relevant genes with no reportable alterations: ALK, BRAF, EGFR, KRAS, MET, RET, ROS1   PDL1 expression is 30%.  PRIOR THERAPY: 1) Weekly concurrent chemoradiation with carboplatin for an AUC of 2 and paclitaxel 45 mg/m2 to the locally advanced disease in the chest. He started the first dose 01/31/2019. Status post7cycles.Last dose of chemotherapy was given on March 14, 2019. 2) palliative radiotherapy to the metastatic disease in the right adrenal gland. 3)Palliative radiotherapy to the enlarging muscular metastasis in the right hipwithin the right adductor muscle, medial to the lesser trochanter of the femur under the care of Dr. [name]. First day of radiation9/13/21  CURRENT THERAPY: Systemic chemotherapy with carboplatin for AUC of 5, Alimta 500 mg/M2 and Keytruda 200 mg IV every 3 weeks. First dose April 20, 2019. Status post21cycles.Starting from cycle #5, the patient began maintenance Alimta and Keytruda. His Alimta has been on hold since cycle #19 due to renal insuffiencey.  INTERVAL HISTORY: Trevor Bailey 75 y.o. male returns to the clinic for a follow up visit.  The patient is feeling fairly well today without any concerning complaints. In the interval since his last appointment, the  patient had a dental extraction. He tolerated this well. At his last appointment, he received only single agent immunotherapy with Keytruda.  His Alimta has been held for the last few cycles due to renal insufficiency.  Today, the patient denies any fever, chills, or night sweats.  His weight is fairly stable. He denies any chest pain, shortness of breath, or hemoptysis.  He denies significant cough.  He denies any nausea, vomiting, diarrhea, or constipation.  He denies any headache or visual changes.  He denies any rashes or skin changes.  The patient is here today for evaluation and repeat blood work prior to starting cycle #22.  MEDICAL HISTORY: Past Medical History:  Diagnosis Date  . Adenopathy    left supraclavicular lymph node  . Allergies   . COPD (chronic obstructive pulmonary disease) ([place])   . Coronary disease   . Diabetes ([place])   . Enlarged prostate   . GERD (gastroesophageal reflux disease)   . Glaucoma   . Hyperlipidemia   . Hypertension   . Myocardial infarction ([place])   . nscl ca dx'd 12/2018  . Sleep apnea    wears CPAP  . Wears partial dentures    upper and lower    ALLERGIES:  has No Known Allergies.  MEDICATIONS:  Current Outpatient Medications  Medication Sig Dispense Refill  . ACCU-CHEK GUIDE test strip     . albuterol (VENTOLIN HFA) 108 (90 Base) MCG/ACT inhaler     . amLODipine (NORVASC) 5 MG tablet TAKE 1 TABLET BY MOUTH  DAILY 90 tablet 3  . aspirin EC 81 MG tablet Take 81 mg by mouth daily.    Trevor Bailey atorvastatin (LIPITOR) 80 MG tablet Take 80 mg by mouth daily.    Trevor Bailey  augmented betamethasone dipropionate (DIPROLENE-AF) 0.05 % cream     . CLARITIN 10 MG tablet Take 10 mg by mouth daily.     . clopidogrel (PLAVIX) 75 MG tablet Take 1 tablet (75 mg total) by mouth every evening. 30 tablet 0  . ezetimibe (ZETIA) 10 MG tablet Take 10 mg by mouth daily.    . finasteride (PROSCAR) 5 MG tablet Take 5 mg by mouth daily.     . folic acid (FOLVITE) 1 MG tablet TAKE 1  TABLET BY MOUTH  DAILY 90 tablet 3  . hydrochlorothiazide (HYDRODIURIL) 25 MG tablet Take 25 mg by mouth daily.     Trevor Bailey latanoprost (XALATAN) 0.005 % ophthalmic solution Place 1 drop into both eyes at bedtime.     . metFORMIN (GLUCOPHAGE) 1000 MG tablet Take 1,000 mg by mouth 2 (two) times daily.     . metoprolol tartrate (LOPRESSOR) 25 MG tablet Take 25 mg 2 (two) times daily by mouth.    . mirtazapine (REMERON) 30 MG tablet TAKE 1 TABLET BY MOUTH AT  BEDTIME 90 tablet 3  . Multiple Vitamin (MULTIVITAMIN WITH MINERALS) TABS tablet Take 1 tablet by mouth daily. Centrum Silver    . nitroGLYCERIN (NITROSTAT) 0.4 MG SL tablet Place 1 tablet (0.4 mg total) under the tongue every 5 (five) minutes as needed for chest pain. 25 tablet 6  . ofloxacin (FLOXIN) 0.3 % OTIC solution     . OVER THE COUNTER MEDICATION Take 1 tablet by mouth daily as needed. Colace    . pantoprazole (PROTONIX) 40 MG tablet Take 40 mg daily by mouth.    Vladimir Faster Glycol-Propyl Glycol (SYSTANE) 0.4-0.3 % SOLN Place 1-2 drops into both eyes 3 (three) times daily as needed (dry/irritated eyes.).    Trevor Bailey prochlorperazine (COMPAZINE) 10 MG tablet Take 10 mg by mouth daily as needed for nausea or vomiting.    . ramipril (ALTACE) 10 MG capsule Take 10 mg by mouth 2 (two) times daily.     Trevor Bailey Respiratory Therapy Supplies (CARETOUCH 2 CPAP HOSE HANGER) MISC See admin instructions.     No current facility-administered medications for this visit.    SURGICAL HISTORY:  Past Surgical History:  Procedure Laterality Date  . ABDOMINAL AORTAGRAM Left 09/16/2013   Procedure: ABDOMINAL Maxcine Ham;  Surgeon: Elam Dutch, MD;  Location: [facility] CATH LAB;  Service: Cardiovascular;  Laterality: Left;  . CARDIAC CATHETERIZATION  01/2011   When there was segmental stenosis of the distal RCA, patent PCA stent, patent circumflex stent, and a patent small diagonal with 90% ISR.   Trevor Bailey COLONOSCOPY W/ BIOPSIES AND POLYPECTOMY    . CORONARY ANGIOPLASTY  may 2002    Non-DES stenting of his circumflex, non-DES stenting of the RCA  . HYDROCELE EXCISION / REPAIR  2009   by Dr Janice Norrie  . INGUINAL HERNIA REPAIR Bilateral    Dr Bubba Camp  . LYMPH NODE BIOPSY Left 01/10/2019   Procedure: left supraclavicular LYMPH NODE BIOPSY;  Surgeon: [name], MD;  Location: [facility];  Service: Thoracic;  Laterality: Left;  Trevor Bailey MULTIPLE TOOTH EXTRACTIONS    . NM MYOCAR PERF WALL MOTION  01/08/2011   moderate in size and intensity area of reversible ischemia in the basal to mid inferior and septal territories. Abnormal study  . stents  2008   proximal RCA, DES for progession of disease.  Trevor Bailey US ECHOCARDIOGRAPHY  01/12/2012   mild concentric LVH, borderline LA enlargement, mild to mod TR    REVIEW OF SYSTEMS:  Review of Systems  Constitutional: Negative for appetite change, chills, fatigue, fever and unexpected weight change.  HENT:   Negative for mouth sores, nosebleeds, sore throat and trouble swallowing.   Eyes: Negative for eye problems and icterus.  Respiratory: Negative for cough, hemoptysis, shortness of breath and wheezing.   Cardiovascular: Negative for chest pain and leg swelling.  Gastrointestinal: Negative for abdominal pain, constipation, diarrhea, nausea and vomiting.  Genitourinary: Negative for bladder incontinence, difficulty urinating, dysuria, frequency and hematuria.   Musculoskeletal: Negative for back pain, gait problem, neck pain and neck stiffness.  Skin: Negative for itching and rash.  Neurological: Negative for dizziness, extremity weakness, gait problem, headaches, light-headedness and seizures.  Hematological: Negative for adenopathy. Does not bruise/bleed easily.  Psychiatric/Behavioral: Negative for confusion, depression and sleep disturbance. The patient is not nervous/anxious.     PHYSICAL EXAMINATION:  There were no vitals taken for this visit.  ECOG PERFORMANCE STATUS: 1 - Symptomatic but completely ambulatory  Physical Exam   Constitutional: Oriented to person, place, and time and well-developed, well-nourished, and in no distress. No distress.  HENT:  Head: Normocephalic and atraumatic.  Mouth/Throat: Oropharynx is clear and moist. No oropharyngeal exudate.  Eyes: Conjunctivae are normal. Right eye exhibits no discharge. Left eye exhibits no discharge. No scleral icterus.  Neck: Normal range of motion. Neck supple.  Cardiovascular: Normal rate, regular rhythm, normal heart sounds and intact distal pulses.   Pulmonary/Chest: Effort normal and breath sounds normal. No respiratory distress. No wheezes. No rales.  Abdominal: Soft. Bowel sounds are normal. Exhibits no distension and no mass. There is no tenderness.  Musculoskeletal: Normal range of motion. Exhibits no edema.  Lymphadenopathy:    No cervical adenopathy.  Neurological: Alert and oriented to person, place, and time. Exhibits normal muscle tone. Gait normal. Coordination normal.  Skin: Skin is warm and dry. No rash noted. Not diaphoretic. No erythema. No pallor.  Psychiatric: Mood, memory and judgment normal.  Vitals reviewed.  LABORATORY DATA: Lab Results  Component Value Date   WBC 4.2 06/13/2020   HGB 11.1 (L) 06/13/2020   HCT 33.6 (L) 06/13/2020   MCV 100.9 (H) 06/13/2020   PLT 218 06/13/2020      Chemistry      Component Value Date/Time   NA 139 06/13/2020 0758   K 4.2 06/13/2020 0758   CL 103 06/13/2020 0758   CO2 25 06/13/2020 0758   BUN 31 (H) 06/13/2020 0758   CREATININE 1.69 (H) 06/13/2020 0758   CREATININE 0.94 05/16/2014 1109      Component Value Date/Time   CALCIUM 9.8 06/13/2020 0758   ALKPHOS 105 06/13/2020 0758   AST 25 06/13/2020 0758   ALT 38 06/13/2020 0758   BILITOT 0.2 (L) 06/13/2020 0758       RADIOGRAPHIC STUDIES:  No results found.   ASSESSMENT/PLAN:  This is a very pleasant 76year old African-American male with stage IV non-small cell lung cancer, adenocarcinoma with no actionable mutation and  PD-L1 expression of 30%,presented with locally advanced disease in the chest as well as solitary metastasis to the right adrenal gland.He has no actionable mutations.  The patientcompleted a course of concurrent chemoradiation to the chest with weekly carboplatin for AUC of 2 and paclitaxel 45 mg/M2 status post7cycles.Last dose of chemotherapy was given on March 14, 2019. He also completed palliative radiotherapy to the right adrenal gland lesion under the care of Dr. [name].  He also completed palliative radiotherapy to the muscular metastasis in the right hip in September  2021.  He is currentlyundergoing systemic chemotherapy with carboplatin for an AUC of 5, Alimta 400 mg per metered squared and Keytruda 200 mg IV every 3 weeks. He is status post21 cycles. He tolerated treatment well without any concerning adverse side effects except for mild fatigue.Starting from cycle #5,he has been on maintenance treatment with Alimta and Keytruda. Alimta has been on hold since cycle #19 due to renal insufficiency.    Labs were reviewed.  His creatinine is 1.68 today. I reviewed his labs with Dr. Julien Nordmann.  Recommend that he proceed with cycle #22 today scheduled with single agent Keytruda.   I will arrange for restaging CT scan of the chest, abdomen, and pelvis prior to starting his next cycle of treatment. I will order it without contrast due to his renal insufficiency.   We will see the patient back for follow-up visit in 3 weeks for evaluation and to review his scan results before starting cycle #23.  The patient was advised to call immediately if he has any concerning symptoms in the interval. The patient voices understanding of current disease status and treatment options and is in agreement with the current care plan. All questions were answered. The patient knows to call the clinic with any problems, questions or concerns. We can certainly see the patient much sooner if  necessary        No orders of the defined types were placed in this encounter.    I spent 20-29 minutes in this encounter.   Cassandra L Heilingoetter, PA-C 06/29/20

## 2020-07-02 DIAGNOSIS — H35372 Puckering of macula, left eye: Secondary | ICD-10-CM | POA: Diagnosis not present

## 2020-07-02 DIAGNOSIS — H04123 Dry eye syndrome of bilateral lacrimal glands: Secondary | ICD-10-CM | POA: Diagnosis not present

## 2020-07-02 DIAGNOSIS — D3132 Benign neoplasm of left choroid: Secondary | ICD-10-CM | POA: Diagnosis not present

## 2020-07-02 DIAGNOSIS — E119 Type 2 diabetes mellitus without complications: Secondary | ICD-10-CM | POA: Diagnosis not present

## 2020-07-02 DIAGNOSIS — H401131 Primary open-angle glaucoma, bilateral, mild stage: Secondary | ICD-10-CM | POA: Diagnosis not present

## 2020-07-02 DIAGNOSIS — H35033 Hypertensive retinopathy, bilateral: Secondary | ICD-10-CM | POA: Diagnosis not present

## 2020-07-02 DIAGNOSIS — H524 Presbyopia: Secondary | ICD-10-CM | POA: Diagnosis not present

## 2020-07-02 DIAGNOSIS — H353131 Nonexudative age-related macular degeneration, bilateral, early dry stage: Secondary | ICD-10-CM | POA: Diagnosis not present

## 2020-07-05 ENCOUNTER — Inpatient Hospital Stay: Payer: Medicare Other | Admitting: Physician Assistant

## 2020-07-05 ENCOUNTER — Encounter: Payer: Self-pay | Admitting: Physician Assistant

## 2020-07-05 ENCOUNTER — Inpatient Hospital Stay: Payer: Medicare Other | Attending: Internal Medicine

## 2020-07-05 ENCOUNTER — Other Ambulatory Visit: Payer: Self-pay

## 2020-07-05 ENCOUNTER — Other Ambulatory Visit: Payer: Self-pay | Admitting: Internal Medicine

## 2020-07-05 ENCOUNTER — Inpatient Hospital Stay: Payer: Medicare Other

## 2020-07-05 VITALS — BP 102/60 | HR 77 | Temp 97.9°F | Resp 18 | Ht 69.0 in | Wt 178.4 lb

## 2020-07-05 DIAGNOSIS — Z5112 Encounter for antineoplastic immunotherapy: Secondary | ICD-10-CM

## 2020-07-05 DIAGNOSIS — C3412 Malignant neoplasm of upper lobe, left bronchus or lung: Secondary | ICD-10-CM | POA: Diagnosis not present

## 2020-07-05 DIAGNOSIS — C3490 Malignant neoplasm of unspecified part of unspecified bronchus or lung: Secondary | ICD-10-CM | POA: Diagnosis not present

## 2020-07-05 DIAGNOSIS — C770 Secondary and unspecified malignant neoplasm of lymph nodes of head, face and neck: Secondary | ICD-10-CM | POA: Diagnosis not present

## 2020-07-05 DIAGNOSIS — Z79899 Other long term (current) drug therapy: Secondary | ICD-10-CM | POA: Diagnosis not present

## 2020-07-05 DIAGNOSIS — C7971 Secondary malignant neoplasm of right adrenal gland: Secondary | ICD-10-CM | POA: Insufficient documentation

## 2020-07-05 LAB — CMP (CANCER CENTER ONLY)
ALT: 25 U/L (ref 0–44)
AST: 22 U/L (ref 15–41)
Albumin: 3.3 g/dL — ABNORMAL LOW (ref 3.5–5.0)
Alkaline Phosphatase: 101 U/L (ref 38–126)
Anion gap: 9 (ref 5–15)
BUN: 25 mg/dL — ABNORMAL HIGH (ref 8–23)
CO2: 21 mmol/L — ABNORMAL LOW (ref 22–32)
Calcium: 9.6 mg/dL (ref 8.9–10.3)
Chloride: 107 mmol/L (ref 98–111)
Creatinine: 1.68 mg/dL — ABNORMAL HIGH (ref 0.61–1.24)
GFR, Estimated: 42 mL/min — ABNORMAL LOW (ref >60)
Glucose, Bld: 170 mg/dL — ABNORMAL HIGH (ref 70–99)
Potassium: 4.2 mmol/L (ref 3.5–5.1)
Sodium: 137 mmol/L (ref 135–145)
Total Bilirubin: 0.4 mg/dL (ref 0.3–1.2)
Total Protein: 7.3 g/dL (ref 6.5–8.1)

## 2020-07-05 LAB — CBC WITH DIFFERENTIAL (CANCER CENTER ONLY)
Abs Immature Granulocytes: 0.01 10*3/uL (ref 0.00–0.07)
Basophils Absolute: 0 10*3/uL (ref 0.0–0.1)
Basophils Relative: 1 %
Eosinophils Absolute: 0.2 10*3/uL (ref 0.0–0.5)
Eosinophils Relative: 4 %
HCT: 33 % — ABNORMAL LOW (ref 39.0–52.0)
Hemoglobin: 11.2 g/dL — ABNORMAL LOW (ref 13.0–17.0)
Immature Granulocytes: 0 %
Lymphocytes Relative: 20 %
Lymphs Abs: 0.8 10*3/uL (ref 0.7–4.0)
MCH: 33.6 pg (ref 26.0–34.0)
MCHC: 33.9 g/dL (ref 30.0–36.0)
MCV: 99.1 fL (ref 80.0–100.0)
Monocytes Absolute: 0.5 10*3/uL (ref 0.1–1.0)
Monocytes Relative: 12 %
Neutro Abs: 2.5 10*3/uL (ref 1.7–7.7)
Neutrophils Relative %: 63 %
Platelet Count: 236 10*3/uL (ref 150–400)
RBC: 3.33 MIL/uL — ABNORMAL LOW (ref 4.22–5.81)
RDW: 13.2 % (ref 11.5–15.5)
WBC Count: 3.9 10*3/uL — ABNORMAL LOW (ref 4.0–10.5)
nRBC: 0 % (ref 0.0–0.2)

## 2020-07-05 LAB — TSH: TSH: 0.546 u[IU]/mL (ref 0.320–4.118)

## 2020-07-05 MED ORDER — SODIUM CHLORIDE 0.9 % IV SOLN
200.0000 mg | Freq: Once | INTRAVENOUS | Status: AC
  Administered 2020-07-05: 200 mg via INTRAVENOUS
  Filled 2020-07-05: qty 8

## 2020-07-05 MED ORDER — SODIUM CHLORIDE 0.9 % IV SOLN
Freq: Once | INTRAVENOUS | Status: AC
  Filled 2020-07-05: qty 250

## 2020-07-05 NOTE — Patient Instructions (Signed)
[facility] Discharge Instructions for Patients Receiving Chemotherapy  Today you received the following chemotherapy agents Pembrolizumab [facility]).  To help prevent nausea and vomiting after your treatment, we encourage you to take your nausea medication as prescribed.   If you develop nausea and vomiting that is not controlled by your nausea medication, call the clinic.   BELOW ARE SYMPTOMS THAT SHOULD BE REPORTED IMMEDIATELY:  *FEVER GREATER THAN 100.5 F  *CHILLS WITH OR WITHOUT FEVER  NAUSEA AND VOMITING THAT IS NOT CONTROLLED WITH YOUR NAUSEA MEDICATION  *UNUSUAL SHORTNESS OF BREATH  *UNUSUAL BRUISING OR BLEEDING  TENDERNESS IN MOUTH AND THROAT WITH OR WITHOUT PRESENCE OF ULCERS  *URINARY PROBLEMS  *BOWEL PROBLEMS  UNUSUAL RASH Items with * indicate a potential emergency and should be followed up as soon as possible.  Feel free to call the clinic should you have any questions or concerns. The clinic phone number is [phone].  Please show the [facility] at check-in to the Emergency Department and triage nurse.

## 2020-07-05 NOTE — Progress Notes (Signed)
Pt Scr came back 1.68, per Cassie Hellingoetter, PA, pt is only getting [place] today and not the alimta. Ok to treat with elevated Scr.

## 2020-07-06 ENCOUNTER — Telehealth: Payer: Self-pay | Admitting: Physician Assistant

## 2020-07-06 NOTE — Telephone Encounter (Signed)
Scheduled appointments per 2/17 los. Will have updated calendar printed for patient at next visit.

## 2020-07-10 DIAGNOSIS — I739 Peripheral vascular disease, unspecified: Secondary | ICD-10-CM | POA: Diagnosis not present

## 2020-07-10 DIAGNOSIS — M62838 Other muscle spasm: Secondary | ICD-10-CM | POA: Diagnosis not present

## 2020-07-10 DIAGNOSIS — E1159 Type 2 diabetes mellitus with other circulatory complications: Secondary | ICD-10-CM | POA: Diagnosis not present

## 2020-07-10 DIAGNOSIS — Z7984 Long term (current) use of oral hypoglycemic drugs: Secondary | ICD-10-CM | POA: Diagnosis not present

## 2020-07-10 DIAGNOSIS — E7800 Pure hypercholesterolemia, unspecified: Secondary | ICD-10-CM | POA: Diagnosis not present

## 2020-07-10 DIAGNOSIS — N1831 Chronic kidney disease, stage 3a: Secondary | ICD-10-CM | POA: Diagnosis not present

## 2020-07-10 DIAGNOSIS — J449 Chronic obstructive pulmonary disease, unspecified: Secondary | ICD-10-CM | POA: Diagnosis not present

## 2020-07-10 DIAGNOSIS — I2510 Atherosclerotic heart disease of native coronary artery without angina pectoris: Secondary | ICD-10-CM | POA: Diagnosis not present

## 2020-07-10 DIAGNOSIS — E1165 Type 2 diabetes mellitus with hyperglycemia: Secondary | ICD-10-CM | POA: Diagnosis not present

## 2020-07-24 ENCOUNTER — Other Ambulatory Visit: Payer: Self-pay

## 2020-07-24 ENCOUNTER — Encounter (HOSPITAL_COMMUNITY): Payer: Self-pay

## 2020-07-24 ENCOUNTER — Ambulatory Visit (HOSPITAL_COMMUNITY)
Admission: RE | Admit: 2020-07-24 | Discharge: 2020-07-24 | Disposition: A | Discharge status: Home / Self Care | Payer: Medicare Other | Source: Ambulatory Visit | Attending: Physician Assistant | Admitting: Physician Assistant

## 2020-07-24 DIAGNOSIS — I700 Atherosclerosis of aorta: Secondary | ICD-10-CM | POA: Diagnosis not present

## 2020-07-24 DIAGNOSIS — J8410 Pulmonary fibrosis, unspecified: Secondary | ICD-10-CM | POA: Diagnosis not present

## 2020-07-24 DIAGNOSIS — I2510 Atherosclerotic heart disease of native coronary artery without angina pectoris: Secondary | ICD-10-CM | POA: Diagnosis not present

## 2020-07-24 DIAGNOSIS — J90 Pleural effusion, not elsewhere classified: Secondary | ICD-10-CM | POA: Diagnosis not present

## 2020-07-24 DIAGNOSIS — E278 Other specified disorders of adrenal gland: Secondary | ICD-10-CM | POA: Diagnosis not present

## 2020-07-24 DIAGNOSIS — D3501 Benign neoplasm of right adrenal gland: Secondary | ICD-10-CM | POA: Diagnosis not present

## 2020-07-24 DIAGNOSIS — C3490 Malignant neoplasm of unspecified part of unspecified bronchus or lung: Secondary | ICD-10-CM | POA: Diagnosis not present

## 2020-07-26 ENCOUNTER — Inpatient Hospital Stay: Payer: Medicare Other | Admitting: Internal Medicine

## 2020-07-26 ENCOUNTER — Inpatient Hospital Stay: Payer: Medicare Other

## 2020-07-26 ENCOUNTER — Other Ambulatory Visit: Payer: Self-pay

## 2020-07-26 ENCOUNTER — Inpatient Hospital Stay: Payer: Medicare Other | Attending: Internal Medicine

## 2020-07-26 ENCOUNTER — Encounter: Payer: Self-pay | Admitting: Internal Medicine

## 2020-07-26 VITALS — BP 129/72 | HR 79 | Temp 98.3°F | Resp 13 | Ht 69.0 in | Wt 180.3 lb

## 2020-07-26 DIAGNOSIS — C3412 Malignant neoplasm of upper lobe, left bronchus or lung: Secondary | ICD-10-CM | POA: Insufficient documentation

## 2020-07-26 DIAGNOSIS — I10 Essential (primary) hypertension: Secondary | ICD-10-CM

## 2020-07-26 DIAGNOSIS — Z79899 Other long term (current) drug therapy: Secondary | ICD-10-CM | POA: Insufficient documentation

## 2020-07-26 DIAGNOSIS — Z5112 Encounter for antineoplastic immunotherapy: Secondary | ICD-10-CM | POA: Insufficient documentation

## 2020-07-26 DIAGNOSIS — C7971 Secondary malignant neoplasm of right adrenal gland: Secondary | ICD-10-CM | POA: Diagnosis not present

## 2020-07-26 LAB — CBC WITH DIFFERENTIAL (CANCER CENTER ONLY)
Abs Immature Granulocytes: 0.01 10*3/uL (ref 0.00–0.07)
Basophils Absolute: 0 10*3/uL (ref 0.0–0.1)
Basophils Relative: 1 %
Eosinophils Absolute: 0.1 10*3/uL (ref 0.0–0.5)
Eosinophils Relative: 3 %
HCT: 33.1 % — ABNORMAL LOW (ref 39.0–52.0)
Hemoglobin: 11.1 g/dL — ABNORMAL LOW (ref 13.0–17.0)
Immature Granulocytes: 0 %
Lymphocytes Relative: 20 %
Lymphs Abs: 0.9 10*3/uL (ref 0.7–4.0)
MCH: 33.3 pg (ref 26.0–34.0)
MCHC: 33.5 g/dL (ref 30.0–36.0)
MCV: 99.4 fL (ref 80.0–100.0)
Monocytes Absolute: 0.4 10*3/uL (ref 0.1–1.0)
Monocytes Relative: 10 %
Neutro Abs: 2.8 10*3/uL (ref 1.7–7.7)
Neutrophils Relative %: 66 %
Platelet Count: 228 10*3/uL (ref 150–400)
RBC: 3.33 MIL/uL — ABNORMAL LOW (ref 4.22–5.81)
RDW: 13.4 % (ref 11.5–15.5)
WBC Count: 4.3 10*3/uL (ref 4.0–10.5)
nRBC: 0 % (ref 0.0–0.2)

## 2020-07-26 LAB — CMP (CANCER CENTER ONLY)
ALT: 37 U/L (ref 0–44)
AST: 28 U/L (ref 15–41)
Albumin: 3.4 g/dL — ABNORMAL LOW (ref 3.5–5.0)
Alkaline Phosphatase: 101 U/L (ref 38–126)
Anion gap: 9 (ref 5–15)
BUN: 22 mg/dL (ref 8–23)
CO2: 22 mmol/L (ref 22–32)
Calcium: 9.5 mg/dL (ref 8.9–10.3)
Chloride: 107 mmol/L (ref 98–111)
Creatinine: 1.6 mg/dL — ABNORMAL HIGH (ref 0.61–1.24)
GFR, Estimated: 45 mL/min — ABNORMAL LOW (ref >60)
Glucose, Bld: 118 mg/dL — ABNORMAL HIGH (ref 70–99)
Potassium: 4.1 mmol/L (ref 3.5–5.1)
Sodium: 138 mmol/L (ref 135–145)
Total Bilirubin: 0.3 mg/dL (ref 0.3–1.2)
Total Protein: 7.3 g/dL (ref 6.5–8.1)

## 2020-07-26 LAB — TSH: TSH: 0.888 u[IU]/mL (ref 0.320–4.118)

## 2020-07-26 MED ORDER — SODIUM CHLORIDE 0.9 % IV SOLN
Freq: Once | INTRAVENOUS | Status: AC
  Filled 2020-07-26: qty 250

## 2020-07-26 MED ORDER — PEMBROLIZUMAB CHEMO INJECTION 100 MG/4ML
200.0000 mg | Freq: Once | INTRAVENOUS | Status: AC
  Administered 2020-07-26: 200 mg via INTRAVENOUS
  Filled 2020-07-26: qty 8

## 2020-07-26 NOTE — Progress Notes (Signed)
Creatnine 1.6 today, ok to treat per Dr. Julien Nordmann.

## 2020-07-26 NOTE — Progress Notes (Signed)
[place] Telephone:([phone]   Fax:([phone]  OFFICE PROGRESS NOTE  [name], MD [address] [place]  [address]  DIAGNOSIS:  Stage IV (T1b, N3, M1c) non-small cell lung cancer, adenocarcinoma. 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  Biomarker Findings Tumor Mutational Burden - 10 Muts/Mb Microsatellite status - MS-Stable Genomic Findings For a complete list of the genes assayed, please refer to the Appendix. ERBB2 amplification - equivocal? TRR11 A57* FANCC splice site [id number]>F SMAD4 Q289* TP53 E298* 7 Disease relevant genes with no reportable alterations: ALK, BRAF, EGFR, KRAS, MET, RET, ROS1   PDL1 expression is 30%.  PRIOR THERAPY: 1) Weekly concurrent chemoradiation with carboplatin for an AUC of 2 and paclitaxel 45 mg/m2 to the locally advanced disease in the chest. He started the first dose 01/31/2019. Status post7cycles.Last dose of chemotherapy was given on March 14, 2019. 2) palliative radiotherapy to the metastatic disease in the right adrenal gland. 3) palliative radiotherapy to the metastatic deposit in the right obturator externus and abductor magnus under the care of Dr. [name].  CURRENT THERAPY: Systemic chemotherapy with carboplatin for AUC of 5, Alimta 500 mg/M2 and Keytruda 200 mg IV every 3 weeks. First dose April 20, 2019. Status post 22 cycles.   Starting from cycle #5, the patient is on maintenance treatment with Alimta and Keytruda every 3 weeks.  Recently the patient has been on treatment with single agent Keytruda secondary to renal insufficiency.  INTERVAL HISTORY: Trevor Bailey 75 y.o. male returns to the clinic today for follow-up visit.  The patient is feeling fine today with no concerning complaints.  He  continues to tolerate his maintenance treatment with single agent Keytruda fairly well.  He denied having any current chest pain, shortness of breath, cough or hemoptysis.  He denied having any nausea, vomiting, diarrhea or constipation.  He has no headache or visual changes.  He has no significant weight loss or night sweats.  He had repeat CT scan of the chest, abdomen pelvis performed recently and he is here for evaluation and discussion of his discuss results.   MEDICAL HISTORY: Past Medical History:  Diagnosis Date  . Adenopathy    left supraclavicular lymph node  . Allergies   . COPD (chronic obstructive pulmonary disease) ([place])   . Coronary disease   . Diabetes ([place])   . Enlarged prostate   . GERD (gastroesophageal reflux disease)   . Glaucoma   . Hyperlipidemia   . Hypertension   . Myocardial infarction ([place])   . nscl ca dx'd 12/2018  . Sleep apnea    wears CPAP  . Wears partial dentures    upper and lower    ALLERGIES:  has No Known Allergies.  MEDICATIONS:  Current Outpatient Medications  Medication Sig Dispense Refill  . budesonide-formoterol (SYMBICORT) 160-4.5 MCG/ACT inhaler Inhale 2 puffs into the lungs 2 (two) times daily.    Marland Kitchen ACCU-CHEK GUIDE test strip     . albuterol (VENTOLIN HFA) 108 (90 Base) MCG/ACT inhaler     . amLODipine (NORVASC) 5 MG tablet TAKE 1 TABLET BY MOUTH  DAILY 90 tablet 3  . aspirin EC 81 MG tablet Take 81 mg by mouth daily.    Marland Kitchen atorvastatin (LIPITOR) 80 MG tablet Take 80 mg by mouth daily.    Marland Kitchen  augmented betamethasone dipropionate (DIPROLENE-AF) 0.05 % cream     . CLARITIN 10 MG tablet Take 10 mg by mouth daily.     . clopidogrel (PLAVIX) 75 MG tablet Take 1 tablet (75 mg total) by mouth every evening. 30 tablet 0  . ezetimibe (ZETIA) 10 MG tablet Take 10 mg by mouth daily.    . finasteride (PROSCAR) 5 MG tablet Take 5 mg by mouth daily.     . folic acid (FOLVITE) 1 MG tablet TAKE 1 TABLET BY MOUTH  DAILY 90 tablet 3  .  hydrochlorothiazide (HYDRODIURIL) 25 MG tablet Take 25 mg by mouth daily.     Marland Kitchen latanoprost (XALATAN) 0.005 % ophthalmic solution Place 1 drop into both eyes at bedtime.     . metFORMIN (GLUCOPHAGE) 1000 MG tablet Take 1,000 mg by mouth 2 (two) times daily.     . metoprolol tartrate (LOPRESSOR) 25 MG tablet Take 25 mg 2 (two) times daily by mouth.    . mirtazapine (REMERON) 30 MG tablet TAKE 1 TABLET BY MOUTH AT  BEDTIME 90 tablet 3  . Multiple Vitamin (MULTIVITAMIN WITH MINERALS) TABS tablet Take 1 tablet by mouth daily. Centrum Silver    . nitroGLYCERIN (NITROSTAT) 0.4 MG SL tablet Place 1 tablet (0.4 mg total) under the tongue every 5 (five) minutes as needed for chest pain. 25 tablet 6  . ofloxacin (FLOXIN) 0.3 % OTIC solution     . OVER THE COUNTER MEDICATION Take 1 tablet by mouth daily as needed. Colace    . pantoprazole (PROTONIX) 40 MG tablet Take 40 mg daily by mouth.    Vladimir Faster Glycol-Propyl Glycol (SYSTANE) 0.4-0.3 % SOLN Place 1-2 drops into both eyes 3 (three) times daily as needed (dry/irritated eyes.).    Marland Kitchen prochlorperazine (COMPAZINE) 10 MG tablet Take 10 mg by mouth daily as needed for nausea or vomiting.    . ramipril (ALTACE) 10 MG capsule Take 10 mg by mouth 2 (two) times daily.     Marland Kitchen Respiratory Therapy Supplies (CARETOUCH 2 CPAP HOSE HANGER) MISC See admin instructions.     No current facility-administered medications for this visit.    SURGICAL HISTORY:  Past Surgical History:  Procedure Laterality Date  . ABDOMINAL AORTAGRAM Left 09/16/2013   Procedure: ABDOMINAL Maxcine Ham;  Surgeon: Elam Dutch, MD;  Location: [facility] CATH LAB;  Service: Cardiovascular;  Laterality: Left;  . CARDIAC CATHETERIZATION  01/2011   When there was segmental stenosis of the distal RCA, patent PCA stent, patent circumflex stent, and a patent small diagonal with 90% ISR.   Marland Kitchen COLONOSCOPY W/ BIOPSIES AND POLYPECTOMY    . CORONARY ANGIOPLASTY  may 2002   Non-DES stenting of his circumflex,  non-DES stenting of the RCA  . HYDROCELE EXCISION / REPAIR  2009   by Dr Janice Norrie  . INGUINAL HERNIA REPAIR Bilateral    Dr Bubba Camp  . LYMPH NODE BIOPSY Left 01/10/2019   Procedure: left supraclavicular LYMPH NODE BIOPSY;  Surgeon: [name], MD;  Location: [facility];  Service: Thoracic;  Laterality: Left;  Marland Kitchen MULTIPLE TOOTH EXTRACTIONS    . NM MYOCAR PERF WALL MOTION  01/08/2011   moderate in size and intensity area of reversible ischemia in the basal to mid inferior and septal territories. Abnormal study  . stents  2008   proximal RCA, DES for progession of disease.  Marland Kitchen US ECHOCARDIOGRAPHY  01/12/2012   mild concentric LVH, borderline LA enlargement, mild to mod TR    REVIEW OF SYSTEMS:  Constitutional: negative Eyes: negative Ears, nose, mouth, throat, and face: negative Respiratory: negative Cardiovascular: negative Gastrointestinal: negative Genitourinary:negative Integument/breast: negative Hematologic/lymphatic: negative Musculoskeletal:negative Neurological: negative Behavioral/Psych: negative Endocrine: negative Allergic/Immunologic: negative   PHYSICAL EXAMINATION: General appearance: alert, cooperative and no distress Head: Normocephalic, without obvious abnormality, atraumatic Neck: no adenopathy, no JVD, supple, symmetrical, trachea midline and thyroid not enlarged, symmetric, no tenderness/mass/nodules Lymph nodes: Cervical, supraclavicular, and axillary nodes normal. Resp: clear to auscultation bilaterally Back: symmetric, no curvature. ROM normal. No CVA tenderness. Cardio: regular rate and rhythm, S1, S2 normal, no murmur, click, rub or gallop GI: soft, non-tender; bowel sounds normal; no masses,  no organomegaly Extremities: extremities normal, atraumatic, no cyanosis or edema Neurologic: Alert and oriented X 3, normal strength and tone. Normal symmetric reflexes. Normal coordination and gait  ECOG PERFORMANCE STATUS: 1 - Symptomatic but completely  ambulatory  Blood pressure 129/72, pulse 79, temperature 98.3 F (36.8 C), temperature source Tympanic, resp. rate 13, height $RemoveBe'5\' 9"'OsrYQTHKM$  (1.753 m), weight 180 lb 4.8 oz (81.8 kg), SpO2 100 %.  LABORATORY DATA: Lab Results  Component Value Date   WBC 4.3 07/26/2020   HGB 11.1 (L) 07/26/2020   HCT 33.1 (L) 07/26/2020   MCV 99.4 07/26/2020   PLT 228 07/26/2020      Chemistry      Component Value Date/Time   NA 137 07/05/2020 1122   K 4.2 07/05/2020 1122   CL 107 07/05/2020 1122   CO2 21 (L) 07/05/2020 1122   BUN 25 (H) 07/05/2020 1122   CREATININE 1.68 (H) 07/05/2020 1122   CREATININE 0.94 05/16/2014 1109      Component Value Date/Time   CALCIUM 9.6 07/05/2020 1122   ALKPHOS 101 07/05/2020 1122   AST 22 07/05/2020 1122   ALT 25 07/05/2020 1122   BILITOT 0.4 07/05/2020 1122       RADIOGRAPHIC STUDIES: CT Abdomen Pelvis Wo Contrast  Result Date: 07/24/2020 CLINICAL DATA:  Primary Cancer Type: Lung Imaging Indication: Assess response to therapy Interval therapy since last imaging? Yes Initial Cancer Diagnosis Date: 01/10/2019; established by: Biopsy-proven Detailed Pathology: Stage IV non-small cell lung cancer, adenocarcinoma. Primary Tumor location: Left upper lobe. Metastasis to right adrenal gland. Muscular lesion along the right inferior pubic ramus. Surgeries: Coronary angioplasty.  Inguinal hernia repair. Chemotherapy: Yes; Ongoing?  No; Most recent administration: 03/2020 Immunotherapy?  Yes; Type: Keytruda; Ongoing? Yes Radiation therapy? Yes Date Range: 01/31/2020 - 02/17/2020; Target: Right pelvis Date Range: 01/31/2019 - 04/07/2019; Target: Left lung and right adrenal gland EXAM: CT CHEST, ABDOMEN AND PELVIS WITHOUT CONTRAST TECHNIQUE: Multidetector CT imaging of the chest, abdomen and pelvis was performed following the standard protocol without IV contrast. COMPARISON:  Most recent CT chest, abdomen and pelvis 04/30/2020. 12/06/2018 PET-CT. FINDINGS: CT CHEST FINDINGS  Cardiovascular: Aortic atherosclerosis. Normal heart size. Extensive Three-vessel coronary artery calcifications and/or stents. No pericardial effusion. Mediastinum/Nodes: No enlarged mediastinal, hilar, or axillary lymph nodes. Thyroid gland, trachea, and esophagus demonstrate no significant findings. Lungs/Pleura: Moderate centrilobular emphysema. Continued interval evolution of suprahilar and paramedian fibrosis of the medial left upper lobe (series 6, image 41). Unchanged small left pleural effusion. Musculoskeletal: No chest wall mass or suspicious bone lesions identified. CT ABDOMEN PELVIS FINDINGS Hepatobiliary: Multiple low-attenuation lesions of the liver, incompletely characterized on noncontrast examination although unchanged and most likely cysts and/or hemangiomata. No gallstones, gallbladder wall thickening, or biliary dilatation. Pancreas: Unremarkable. No pancreatic ductal dilatation or surrounding inflammatory changes. Spleen: Normal in size without significant abnormality. Adrenals/Urinary Tract: A previously established right  adrenal metastasis is not appreciated. Kidneys are normal, without renal calculi, solid lesion, or hydronephrosis. Bladder is unremarkable. Stomach/Bowel: Stomach is within normal limits. Appendix appears normal. No evidence of bowel wall thickening, distention, or inflammatory changes. Sigmoid diverticula. Vascular/Lymphatic: Aortic atherosclerosis. No enlarged abdominal or pelvic lymph nodes. Reproductive: Mild prostatomegaly and median lobe hypertrophy. Other: No abdominal wall hernia or abnormality. No abdominopelvic ascites. Musculoskeletal: No significant osteo findings. A hypodense lesion in the right abductor musculature is only partially included at the inferior aspect of the exam and not sufficiently imaged to assess for interval change (series 2, image 126). IMPRESSION: 1. Continued interval evolution of suprahilar and paramedian fibrosis of the medial left upper  lobe. No evidence of malignant recurrence in the chest. 2. Unchanged small left pleural effusion, nonspecific. 3. A previously established right adrenal metastasis is not appreciated by noncontrast CT. 4. A hypodense lesion in the right abductor musculature is not sufficiently imaged to assess for interval change. 5. Moderate emphysema. 6. Coronary artery disease. Aortic Atherosclerosis (ICD10-I70.0) and Emphysema (ICD10-J43.9). Electronically Signed   By: Eddie Candle M.D.   On: 07/24/2020 11:34   CT Chest Wo Contrast  Result Date: 07/24/2020 CLINICAL DATA:  Primary Cancer Type: Lung Imaging Indication: Assess response to therapy Interval therapy since last imaging? Yes Initial Cancer Diagnosis Date: 01/10/2019; established by: Biopsy-proven Detailed Pathology: Stage IV non-small cell lung cancer, adenocarcinoma. Primary Tumor location: Left upper lobe. Metastasis to right adrenal gland. Muscular lesion along the right inferior pubic ramus. Surgeries: Coronary angioplasty.  Inguinal hernia repair. Chemotherapy: Yes; Ongoing?  No; Most recent administration: 03/2020 Immunotherapy?  Yes; Type: Keytruda; Ongoing? Yes Radiation therapy? Yes Date Range: 01/31/2020 - 02/17/2020; Target: Right pelvis Date Range: 01/31/2019 - 04/07/2019; Target: Left lung and right adrenal gland EXAM: CT CHEST, ABDOMEN AND PELVIS WITHOUT CONTRAST TECHNIQUE: Multidetector CT imaging of the chest, abdomen and pelvis was performed following the standard protocol without IV contrast. COMPARISON:  Most recent CT chest, abdomen and pelvis 04/30/2020. 12/06/2018 PET-CT. FINDINGS: CT CHEST FINDINGS Cardiovascular: Aortic atherosclerosis. Normal heart size. Extensive Three-vessel coronary artery calcifications and/or stents. No pericardial effusion. Mediastinum/Nodes: No enlarged mediastinal, hilar, or axillary lymph nodes. Thyroid gland, trachea, and esophagus demonstrate no significant findings. Lungs/Pleura: Moderate centrilobular emphysema.  Continued interval evolution of suprahilar and paramedian fibrosis of the medial left upper lobe (series 6, image 41). Unchanged small left pleural effusion. Musculoskeletal: No chest wall mass or suspicious bone lesions identified. CT ABDOMEN PELVIS FINDINGS Hepatobiliary: Multiple low-attenuation lesions of the liver, incompletely characterized on noncontrast examination although unchanged and most likely cysts and/or hemangiomata. No gallstones, gallbladder wall thickening, or biliary dilatation. Pancreas: Unremarkable. No pancreatic ductal dilatation or surrounding inflammatory changes. Spleen: Normal in size without significant abnormality. Adrenals/Urinary Tract: A previously established right adrenal metastasis is not appreciated. Kidneys are normal, without renal calculi, solid lesion, or hydronephrosis. Bladder is unremarkable. Stomach/Bowel: Stomach is within normal limits. Appendix appears normal. No evidence of bowel wall thickening, distention, or inflammatory changes. Sigmoid diverticula. Vascular/Lymphatic: Aortic atherosclerosis. No enlarged abdominal or pelvic lymph nodes. Reproductive: Mild prostatomegaly and median lobe hypertrophy. Other: No abdominal wall hernia or abnormality. No abdominopelvic ascites. Musculoskeletal: No significant osteo findings. A hypodense lesion in the right abductor musculature is only partially included at the inferior aspect of the exam and not sufficiently imaged to assess for interval change (series 2, image 126). IMPRESSION: 1. Continued interval evolution of suprahilar and paramedian fibrosis of the medial left upper lobe. No evidence of malignant recurrence in the  chest. 2. Unchanged small left pleural effusion, nonspecific. 3. A previously established right adrenal metastasis is not appreciated by noncontrast CT. 4. A hypodense lesion in the right abductor musculature is not sufficiently imaged to assess for interval change. 5. Moderate emphysema. 6. Coronary  artery disease. Aortic Atherosclerosis (ICD10-I70.0) and Emphysema (ICD10-J43.9). Electronically Signed   By: Eddie Candle M.D.   On: 07/24/2020 11:34    ASSESSMENT AND PLAN: This is a very pleasant 75 years old African-American male with stage IV non-small cell lung cancer, adenocarcinoma with no actionable mutation and PD-L1 expression of 30%, presented with locally advanced disease in the chest as well as solitary metastasis to the right adrenal gland. The patient completed a course of concurrent chemoradiation to the chest with weekly carboplatin for AUC of 2 and paclitaxel 45 mg/M2 status post 7 cycles..  The patient had evidence for disease progression and he was started on systemic chemotherapy with carboplatin, Alimta and Keytruda for 4 cycles and he is currently on maintenance treatment with Alimta and Keytruda every 3 weeks status post 18 more cycles.  He is currently on treatment with single agent Keytruda because of the renal insufficiency. He had repeat CT scan of the chest, abdomen pelvis performed recently.  I personally and independently reviewed the scans and discussed the results with the patient today. His scan showed no concerning findings for disease progression. I recommended for the patient to continue his current treatment with maintenance Keytruda and he will proceed with cycle #23 today. The patient will come back for follow-up visit in 3 weeks for evaluation before the next cycle of his treatment. For the lack of appetite, he will continue his current treatment with Remeron. He was advised to call immediately if he has any other concerning symptoms in the interval The patient voices understanding of current disease status and treatment options and is in agreement with the current care plan. All questions were answered. The patient knows to call the clinic with any problems, questions or concerns. We can certainly see the patient much sooner if necessary.   Disclaimer: This  note was dictated with voice recognition software. Similar sounding words can inadvertently be transcribed and may not be corrected upon review.

## 2020-07-26 NOTE — Patient Instructions (Signed)
[facility] Discharge Instructions for Patients Receiving Chemotherapy  Today you received the following chemotherapy agents Pembrolizumab [facility]).  To help prevent nausea and vomiting after your treatment, we encourage you to take your nausea medication as prescribed.   If you develop nausea and vomiting that is not controlled by your nausea medication, call the clinic.   BELOW ARE SYMPTOMS THAT SHOULD BE REPORTED IMMEDIATELY:  *FEVER GREATER THAN 100.5 F  *CHILLS WITH OR WITHOUT FEVER  NAUSEA AND VOMITING THAT IS NOT CONTROLLED WITH YOUR NAUSEA MEDICATION  *UNUSUAL SHORTNESS OF BREATH  *UNUSUAL BRUISING OR BLEEDING  TENDERNESS IN MOUTH AND THROAT WITH OR WITHOUT PRESENCE OF ULCERS  *URINARY PROBLEMS  *BOWEL PROBLEMS  UNUSUAL RASH Items with * indicate a potential emergency and should be followed up as soon as possible.  Feel free to call the clinic should you have any questions or concerns. The clinic phone number is [phone].  Please show the [place] at check-in to the Emergency Department and triage nurse.

## 2020-08-09 NOTE — Progress Notes (Signed)
[facility] OFFICE PROGRESS NOTE  [name], MD [place] [address] [place] [address]  DIAGNOSIS: Stage IV (T1b, N3,M1c) non-small cell lung cancer, adenocarcinoma. 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  Biomarker Findings Tumor Mutational Burden - 10 Muts/Mb Microsatellite status - MS-Stable Genomic Findings For a complete list of the genes assayed, please refer to the Appendix. ERBB2 amplification - equivocal? LGX21 J94* FANCC splice site 174+0C>X SMAD4 Q289* TP53 E298* 7 Disease relevant genes with no reportable alterations: ALK, BRAF, EGFR, KRAS, MET, RET, ROS1   PDL1 expression is 30%.  PRIOR THERAPY: 1) Weekly concurrent chemoradiation with carboplatin for an AUC of 2 and paclitaxel 45 mg/m2 to the locally advanced disease in the chest. He started the first dose 01/31/2019. Status post7cycles.Last dose of chemotherapy was given on March 14, 2019. 2) palliative radiotherapy to the metastatic disease in the right adrenal gland. 3)Palliative radiotherapy to the enlarging muscular metastasis in the right hipwithin the right adductor muscle, medial to the lesser trochanter of the femur under the care of Dr. [name]. First day of radiation9/13/21  CURRENT THERAPY: Systemic chemotherapy with carboplatin for AUC of 5, Alimta 500 mg/M2 and Keytruda 200 mg IV every 3 weeks. First dose April 20, 2019. Status post23cycles.Starting from cycle #5, the patient began maintenance Alimta and Keytruda.His Alimta has been on hold since cycle #19 due to renal insuffiencey.  INTERVAL HISTORY: Trevor Bailey 75 y.o. male returns to the clinic for a follow up visit.  The patient is feeling fairly well today without any concerning complaints. At his last appointment, he received only single  agent immunotherapy with Keytruda.  His Alimta has been held for the last few cycles due to renal insufficiency.  Today, the patient denies any fever, chills, or night sweats.  His weight is fairly stable. He denies any chest pain, shortness of breath, or hemoptysis.  He denies significant cough.  He denies any nausea, vomiting, diarrhea, or constipation.  He denies any headache or visual changes.  He denies any rashes or skin changes.  The patient is here today for evaluation and repeat blood work prior to starting cycle #24.   MEDICAL HISTORY: Past Medical History:  Diagnosis Date  . Adenopathy    left supraclavicular lymph node  . Allergies   . COPD (chronic obstructive pulmonary disease) ([place])   . Coronary disease   . Diabetes ([place])   . Enlarged prostate   . GERD (gastroesophageal reflux disease)   . Glaucoma   . Hyperlipidemia   . Hypertension   . Myocardial infarction ([place])   . nscl ca dx'd 12/2018  . Sleep apnea    wears CPAP  . Wears partial dentures    upper and lower    ALLERGIES:  has No Known Allergies.  MEDICATIONS:  Current Outpatient Medications  Medication Sig Dispense Refill  . ACCU-CHEK GUIDE test strip     . albuterol (VENTOLIN HFA) 108 (90 Base) MCG/ACT inhaler     . amLODipine (NORVASC) 5 MG tablet TAKE 1 TABLET BY MOUTH  DAILY 90 tablet 3  . aspirin EC 81 MG tablet Take 81 mg by mouth daily.    Marland Kitchen atorvastatin (LIPITOR) 80 MG tablet Take 80 mg by mouth daily.    Marland Kitchen augmented betamethasone dipropionate (DIPROLENE-AF) 0.05 % cream     . budesonide-formoterol (SYMBICORT) 160-4.5 MCG/ACT  inhaler Inhale 2 puffs into the lungs 2 (two) times daily.    Marland Kitchen CLARITIN 10 MG tablet Take 10 mg by mouth daily.     . clopidogrel (PLAVIX) 75 MG tablet Take 1 tablet (75 mg total) by mouth every evening. 30 tablet 0  . ezetimibe (ZETIA) 10 MG tablet Take 10 mg by mouth daily.    . finasteride (PROSCAR) 5 MG tablet Take 5 mg by mouth daily.     . folic acid (FOLVITE) 1 MG tablet  TAKE 1 TABLET BY MOUTH  DAILY 90 tablet 3  . hydrochlorothiazide (HYDRODIURIL) 25 MG tablet Take 25 mg by mouth daily.     Marland Kitchen latanoprost (XALATAN) 0.005 % ophthalmic solution Place 1 drop into both eyes at bedtime.     . metFORMIN (GLUCOPHAGE) 1000 MG tablet Take 1,000 mg by mouth 2 (two) times daily.     . metoprolol tartrate (LOPRESSOR) 25 MG tablet Take 25 mg 2 (two) times daily by mouth.    . mirtazapine (REMERON) 30 MG tablet TAKE 1 TABLET BY MOUTH AT  BEDTIME 90 tablet 3  . Multiple Vitamin (MULTIVITAMIN WITH MINERALS) TABS tablet Take 1 tablet by mouth daily. Centrum Silver    . nitroGLYCERIN (NITROSTAT) 0.4 MG SL tablet Place 1 tablet (0.4 mg total) under the tongue every 5 (five) minutes as needed for chest pain. 25 tablet 6  . ofloxacin (FLOXIN) 0.3 % OTIC solution     . OVER THE COUNTER MEDICATION Take 1 tablet by mouth daily as needed. Colace    . pantoprazole (PROTONIX) 40 MG tablet Take 40 mg daily by mouth.    Vladimir Faster Glycol-Propyl Glycol (SYSTANE) 0.4-0.3 % SOLN Place 1-2 drops into both eyes 3 (three) times daily as needed (dry/irritated eyes.).    Marland Kitchen prochlorperazine (COMPAZINE) 10 MG tablet Take 10 mg by mouth daily as needed for nausea or vomiting.    . ramipril (ALTACE) 10 MG capsule Take 10 mg by mouth 2 (two) times daily.     Marland Kitchen Respiratory Therapy Supplies (CARETOUCH 2 CPAP HOSE HANGER) MISC See admin instructions.     No current facility-administered medications for this visit.    SURGICAL HISTORY:  Past Surgical History:  Procedure Laterality Date  . ABDOMINAL AORTAGRAM Left 09/16/2013   Procedure: ABDOMINAL Maxcine Ham;  Surgeon: Elam Dutch, MD;  Location: [facility] CATH LAB;  Service: Cardiovascular;  Laterality: Left;  . CARDIAC CATHETERIZATION  01/2011   When there was segmental stenosis of the distal RCA, patent PCA stent, patent circumflex stent, and a patent small diagonal with 90% ISR.   Marland Kitchen COLONOSCOPY W/ BIOPSIES AND POLYPECTOMY    . CORONARY ANGIOPLASTY  may  2002   Non-DES stenting of his circumflex, non-DES stenting of the RCA  . HYDROCELE EXCISION / REPAIR  2009   by Dr Janice Norrie  . INGUINAL HERNIA REPAIR Bilateral    Dr Bubba Camp  . LYMPH NODE BIOPSY Left 01/10/2019   Procedure: left supraclavicular LYMPH NODE BIOPSY;  Surgeon: [name], MD;  Location: [facility];  Service: Thoracic;  Laterality: Left;  Marland Kitchen MULTIPLE TOOTH EXTRACTIONS    . NM MYOCAR PERF WALL MOTION  01/08/2011   moderate in size and intensity area of reversible ischemia in the basal to mid inferior and septal territories. Abnormal study  . stents  2008   proximal RCA, DES for progession of disease.  Marland Kitchen US ECHOCARDIOGRAPHY  01/12/2012   mild concentric LVH, borderline LA enlargement, mild to mod TR  REVIEW OF SYSTEMS:   Review of Systems  Constitutional: Negative for appetite change, chills, fatigue, fever and unexpected weight change.  HENT:   Negative for mouth sores, nosebleeds, sore throat and trouble swallowing.   Eyes: Negative for eye problems and icterus.  Respiratory: Negative for cough, hemoptysis, shortness of breath and wheezing.   Cardiovascular: Negative for chest pain and leg swelling.  Gastrointestinal: Negative for abdominal pain, constipation, diarrhea, nausea and vomiting.  Genitourinary: Negative for bladder incontinence, difficulty urinating, dysuria, frequency and hematuria.   Musculoskeletal: Negative for back pain, gait problem, neck pain and neck stiffness.  Skin: Negative for itching and rash.  Neurological: Negative for dizziness, extremity weakness, gait problem, headaches, light-headedness and seizures.  Hematological: Negative for adenopathy. Does not bruise/bleed easily.  Psychiatric/Behavioral: Negative for confusion, depression and sleep disturbance. The patient is not nervous/anxious.   PHYSICAL EXAMINATION:  Blood pressure 116/77, pulse 71, temperature (!) 97.5 F (36.4 C), temperature source Tympanic, resp. rate 18, height _0  (1.753  m), weight 180 lb 1.6 oz (81.7 kg), SpO2 100 %.  ECOG PERFORMANCE STATUS: 1 - Symptomatic but completely ambulatory  Physical Exam  Constitutional: Oriented to person, place, and time and well-developed, well-nourished, and in no distress. No distress.  HENT:  Head: Normocephalic and atraumatic.  Mouth/Throat: Oropharynx is clear and moist. No oropharyngeal exudate.  Eyes: Conjunctivae are normal. Right eye exhibits no discharge. Left eye exhibits no discharge. No scleral icterus.  Neck: Normal range of motion. Neck supple.  Cardiovascular: Normal rate, regular rhythm, normal heart sounds and intact distal pulses.   Pulmonary/Chest: Effort normal and breath sounds normal. No respiratory distress. No wheezes. No rales.  Abdominal: Soft. Bowel sounds are normal. Exhibits no distension and no mass. There is no tenderness.  Musculoskeletal: Normal range of motion. Exhibits no edema.  Lymphadenopathy:    No cervical adenopathy.  Neurological: Alert and oriented to person, place, and time. Exhibits normal muscle tone. Gait normal. Coordination normal.  Skin: Skin is warm and dry. No rash noted. Not diaphoretic. No erythema. No pallor.  Psychiatric: Mood, memory and judgment normal.  Vitals reviewed.  LABORATORY DATA: Lab Results  Component Value Date   WBC 3.9 (L) 08/16/2020   HGB 11.0 (L) 08/16/2020   HCT 32.1 (L) 08/16/2020   MCV 97.3 08/16/2020   PLT 240 08/16/2020      Chemistry      Component Value Date/Time   NA 138 07/26/2020 1031   K 4.1 07/26/2020 1031   CL 107 07/26/2020 1031   CO2 22 07/26/2020 1031   BUN 22 07/26/2020 1031   CREATININE 1.60 (H) 07/26/2020 1031   CREATININE 0.94 05/16/2014 1109      Component Value Date/Time   CALCIUM 9.5 07/26/2020 1031   ALKPHOS 101 07/26/2020 1031   AST 28 07/26/2020 1031   ALT 37 07/26/2020 1031   BILITOT 0.3 07/26/2020 1031       RADIOGRAPHIC STUDIES:  CT Abdomen Pelvis Wo Contrast  Result Date: 07/24/2020 CLINICAL  DATA:  Primary Cancer Type: Lung Imaging Indication: Assess response to therapy Interval therapy since last imaging? Yes Initial Cancer Diagnosis Date: 01/10/2019; established by: Biopsy-proven Detailed Pathology: Stage IV non-small cell lung cancer, adenocarcinoma. Primary Tumor location: Left upper lobe. Metastasis to right adrenal gland. Muscular lesion along the right inferior pubic ramus. Surgeries: Coronary angioplasty.  Inguinal hernia repair. Chemotherapy: Yes; Ongoing?  No; Most recent administration: 03/2020 Immunotherapy?  Yes; Type: Keytruda; Ongoing? Yes Radiation therapy? Yes Date Range: 01/31/2020 - 02/17/2020;  Target: Right pelvis Date Range: 01/31/2019 - 04/07/2019; Target: Left lung and right adrenal gland EXAM: CT CHEST, ABDOMEN AND PELVIS WITHOUT CONTRAST TECHNIQUE: Multidetector CT imaging of the chest, abdomen and pelvis was performed following the standard protocol without IV contrast. COMPARISON:  Most recent CT chest, abdomen and pelvis 04/30/2020. 12/06/2018 PET-CT. FINDINGS: CT CHEST FINDINGS Cardiovascular: Aortic atherosclerosis. Normal heart size. Extensive Three-vessel coronary artery calcifications and/or stents. No pericardial effusion. Mediastinum/Nodes: No enlarged mediastinal, hilar, or axillary lymph nodes. Thyroid gland, trachea, and esophagus demonstrate no significant findings. Lungs/Pleura: Moderate centrilobular emphysema. Continued interval evolution of suprahilar and paramedian fibrosis of the medial left upper lobe (series 6, image 41). Unchanged small left pleural effusion. Musculoskeletal: No chest wall mass or suspicious bone lesions identified. CT ABDOMEN PELVIS FINDINGS Hepatobiliary: Multiple low-attenuation lesions of the liver, incompletely characterized on noncontrast examination although unchanged and most likely cysts and/or hemangiomata. No gallstones, gallbladder wall thickening, or biliary dilatation. Pancreas: Unremarkable. No pancreatic ductal dilatation  or surrounding inflammatory changes. Spleen: Normal in size without significant abnormality. Adrenals/Urinary Tract: A previously established right adrenal metastasis is not appreciated. Kidneys are normal, without renal calculi, solid lesion, or hydronephrosis. Bladder is unremarkable. Stomach/Bowel: Stomach is within normal limits. Appendix appears normal. No evidence of bowel wall thickening, distention, or inflammatory changes. Sigmoid diverticula. Vascular/Lymphatic: Aortic atherosclerosis. No enlarged abdominal or pelvic lymph nodes. Reproductive: Mild prostatomegaly and median lobe hypertrophy. Other: No abdominal wall hernia or abnormality. No abdominopelvic ascites. Musculoskeletal: No significant osteo findings. A hypodense lesion in the right abductor musculature is only partially included at the inferior aspect of the exam and not sufficiently imaged to assess for interval change (series 2, image 126). IMPRESSION: 1. Continued interval evolution of suprahilar and paramedian fibrosis of the medial left upper lobe. No evidence of malignant recurrence in the chest. 2. Unchanged small left pleural effusion, nonspecific. 3. A previously established right adrenal metastasis is not appreciated by noncontrast CT. 4. A hypodense lesion in the right abductor musculature is not sufficiently imaged to assess for interval change. 5. Moderate emphysema. 6. Coronary artery disease. Aortic Atherosclerosis (ICD10-I70.0) and Emphysema (ICD10-J43.9). Electronically Signed   By: Eddie Candle M.D.   On: 07/24/2020 11:34   CT Chest Wo Contrast  Result Date: 07/24/2020 CLINICAL DATA:  Primary Cancer Type: Lung Imaging Indication: Assess response to therapy Interval therapy since last imaging? Yes Initial Cancer Diagnosis Date: 01/10/2019; established by: Biopsy-proven Detailed Pathology: Stage IV non-small cell lung cancer, adenocarcinoma. Primary Tumor location: Left upper lobe. Metastasis to right adrenal gland. Muscular  lesion along the right inferior pubic ramus. Surgeries: Coronary angioplasty.  Inguinal hernia repair. Chemotherapy: Yes; Ongoing?  No; Most recent administration: 03/2020 Immunotherapy?  Yes; Type: Keytruda; Ongoing? Yes Radiation therapy? Yes Date Range: 01/31/2020 - 02/17/2020; Target: Right pelvis Date Range: 01/31/2019 - 04/07/2019; Target: Left lung and right adrenal gland EXAM: CT CHEST, ABDOMEN AND PELVIS WITHOUT CONTRAST TECHNIQUE: Multidetector CT imaging of the chest, abdomen and pelvis was performed following the standard protocol without IV contrast. COMPARISON:  Most recent CT chest, abdomen and pelvis 04/30/2020. 12/06/2018 PET-CT. FINDINGS: CT CHEST FINDINGS Cardiovascular: Aortic atherosclerosis. Normal heart size. Extensive Three-vessel coronary artery calcifications and/or stents. No pericardial effusion. Mediastinum/Nodes: No enlarged mediastinal, hilar, or axillary lymph nodes. Thyroid gland, trachea, and esophagus demonstrate no significant findings. Lungs/Pleura: Moderate centrilobular emphysema. Continued interval evolution of suprahilar and paramedian fibrosis of the medial left upper lobe (series 6, image 41). Unchanged small left pleural effusion. Musculoskeletal: No chest wall mass or suspicious  bone lesions identified. CT ABDOMEN PELVIS FINDINGS Hepatobiliary: Multiple low-attenuation lesions of the liver, incompletely characterized on noncontrast examination although unchanged and most likely cysts and/or hemangiomata. No gallstones, gallbladder wall thickening, or biliary dilatation. Pancreas: Unremarkable. No pancreatic ductal dilatation or surrounding inflammatory changes. Spleen: Normal in size without significant abnormality. Adrenals/Urinary Tract: A previously established right adrenal metastasis is not appreciated. Kidneys are normal, without renal calculi, solid lesion, or hydronephrosis. Bladder is unremarkable. Stomach/Bowel: Stomach is within normal limits. Appendix appears  normal. No evidence of bowel wall thickening, distention, or inflammatory changes. Sigmoid diverticula. Vascular/Lymphatic: Aortic atherosclerosis. No enlarged abdominal or pelvic lymph nodes. Reproductive: Mild prostatomegaly and median lobe hypertrophy. Other: No abdominal wall hernia or abnormality. No abdominopelvic ascites. Musculoskeletal: No significant osteo findings. A hypodense lesion in the right abductor musculature is only partially included at the inferior aspect of the exam and not sufficiently imaged to assess for interval change (series 2, image 126). IMPRESSION: 1. Continued interval evolution of suprahilar and paramedian fibrosis of the medial left upper lobe. No evidence of malignant recurrence in the chest. 2. Unchanged small left pleural effusion, nonspecific. 3. A previously established right adrenal metastasis is not appreciated by noncontrast CT. 4. A hypodense lesion in the right abductor musculature is not sufficiently imaged to assess for interval change. 5. Moderate emphysema. 6. Coronary artery disease. Aortic Atherosclerosis (ICD10-I70.0) and Emphysema (ICD10-J43.9). Electronically Signed   By: Eddie Candle M.D.   On: 07/24/2020 11:34     ASSESSMENT/PLAN:  This is a very pleasant 75year old African-American male with stage IV non-small cell lung cancer, adenocarcinoma with no actionable mutation and PD-L1 expression of 30%,presented with locally advanced disease in the chest as well as solitary metastasis to the right adrenal gland.He has no actionable mutations.  The patientcompleted a course of concurrent chemoradiation to the chest with weekly carboplatin for AUC of 2 and paclitaxel 45 mg/M2 status post7cycles.Last dose of chemotherapy was given on March 14, 2019. He also completed palliative radiotherapy to the right adrenal gland lesion under the care of Dr. [name].  He also completed palliative radiotherapy to the muscular metastasis in the right hip in  September 2021.  He is currentlyundergoing systemic chemotherapy with carboplatin for an AUC of 5, Alimta 400 mg per metered squared and Keytruda 200 mg IV every 3 weeks. He is status post23cycles. He tolerated treatment well without any concerning adverse side effects except for mild fatigue.Starting from cycle #5,he has been on maintenance treatment with Alimta and Keytruda.Alimta has been on hold since cycle #19 due to renal insufficiency.   Labs were reviewed.  His creatinine is 1.77 today.  Recommend that he proceed with cycle #24 today scheduled with single agent Keytruda.  We will see the patient back for follow-up visit in 3 weeks for evaluation before starting cycle #25.  The patient was advised to call immediately if he has any concerning symptoms in the interval. The patient voices understanding of current disease status and treatment options and is in agreement with the current care plan. All questions were answered. The patient knows to call the clinic with any problems, questions or concerns. We can certainly see the patient much sooner if necessary   No orders of the defined types were placed in this encounter.    I spent 20-29 minutes in this encounter.  Taneasha Fuqua L Urban [employer], PA-C 08/16/20

## 2020-08-14 DIAGNOSIS — G4733 Obstructive sleep apnea (adult) (pediatric): Secondary | ICD-10-CM | POA: Diagnosis not present

## 2020-08-16 ENCOUNTER — Inpatient Hospital Stay: Payer: Medicare Other

## 2020-08-16 ENCOUNTER — Other Ambulatory Visit: Payer: Self-pay

## 2020-08-16 ENCOUNTER — Inpatient Hospital Stay: Payer: Medicare Other | Admitting: Physician Assistant

## 2020-08-16 VITALS — BP 116/77 | HR 71 | Temp 97.5°F | Resp 18 | Ht 69.0 in | Wt 180.1 lb

## 2020-08-16 DIAGNOSIS — E1151 Type 2 diabetes mellitus with diabetic peripheral angiopathy without gangrene: Secondary | ICD-10-CM | POA: Diagnosis not present

## 2020-08-16 DIAGNOSIS — J441 Chronic obstructive pulmonary disease with (acute) exacerbation: Secondary | ICD-10-CM | POA: Diagnosis not present

## 2020-08-16 DIAGNOSIS — N1831 Chronic kidney disease, stage 3a: Secondary | ICD-10-CM | POA: Diagnosis not present

## 2020-08-16 DIAGNOSIS — E1159 Type 2 diabetes mellitus with other circulatory complications: Secondary | ICD-10-CM | POA: Diagnosis not present

## 2020-08-16 DIAGNOSIS — I10 Essential (primary) hypertension: Secondary | ICD-10-CM | POA: Diagnosis not present

## 2020-08-16 DIAGNOSIS — Z5112 Encounter for antineoplastic immunotherapy: Secondary | ICD-10-CM

## 2020-08-16 DIAGNOSIS — E782 Mixed hyperlipidemia: Secondary | ICD-10-CM | POA: Diagnosis not present

## 2020-08-16 DIAGNOSIS — E1165 Type 2 diabetes mellitus with hyperglycemia: Secondary | ICD-10-CM | POA: Diagnosis not present

## 2020-08-16 DIAGNOSIS — Z79899 Other long term (current) drug therapy: Secondary | ICD-10-CM | POA: Diagnosis not present

## 2020-08-16 DIAGNOSIS — C3412 Malignant neoplasm of upper lobe, left bronchus or lung: Secondary | ICD-10-CM

## 2020-08-16 DIAGNOSIS — E1169 Type 2 diabetes mellitus with other specified complication: Secondary | ICD-10-CM | POA: Diagnosis not present

## 2020-08-16 DIAGNOSIS — E119 Type 2 diabetes mellitus without complications: Secondary | ICD-10-CM | POA: Diagnosis not present

## 2020-08-16 DIAGNOSIS — C7971 Secondary malignant neoplasm of right adrenal gland: Secondary | ICD-10-CM | POA: Diagnosis not present

## 2020-08-16 DIAGNOSIS — I2510 Atherosclerotic heart disease of native coronary artery without angina pectoris: Secondary | ICD-10-CM | POA: Diagnosis not present

## 2020-08-16 DIAGNOSIS — J449 Chronic obstructive pulmonary disease, unspecified: Secondary | ICD-10-CM | POA: Diagnosis not present

## 2020-08-16 LAB — CBC WITH DIFFERENTIAL (CANCER CENTER ONLY)
Abs Immature Granulocytes: 0.02 10*3/uL (ref 0.00–0.07)
Basophils Absolute: 0 10*3/uL (ref 0.0–0.1)
Basophils Relative: 1 %
Eosinophils Absolute: 0.2 10*3/uL (ref 0.0–0.5)
Eosinophils Relative: 4 %
HCT: 32.1 % — ABNORMAL LOW (ref 39.0–52.0)
Hemoglobin: 11 g/dL — ABNORMAL LOW (ref 13.0–17.0)
Immature Granulocytes: 1 %
Lymphocytes Relative: 25 %
Lymphs Abs: 1 10*3/uL (ref 0.7–4.0)
MCH: 33.3 pg (ref 26.0–34.0)
MCHC: 34.3 g/dL (ref 30.0–36.0)
MCV: 97.3 fL (ref 80.0–100.0)
Monocytes Absolute: 0.5 10*3/uL (ref 0.1–1.0)
Monocytes Relative: 12 %
Neutro Abs: 2.2 10*3/uL (ref 1.7–7.7)
Neutrophils Relative %: 57 %
Platelet Count: 240 10*3/uL (ref 150–400)
RBC: 3.3 MIL/uL — ABNORMAL LOW (ref 4.22–5.81)
RDW: 13.1 % (ref 11.5–15.5)
WBC Count: 3.9 10*3/uL — ABNORMAL LOW (ref 4.0–10.5)
nRBC: 0 % (ref 0.0–0.2)

## 2020-08-16 LAB — TSH: TSH: 1.077 u[IU]/mL (ref 0.320–4.118)

## 2020-08-16 LAB — CMP (CANCER CENTER ONLY)
ALT: 24 U/L (ref 0–44)
AST: 19 U/L (ref 15–41)
Albumin: 3.5 g/dL (ref 3.5–5.0)
Alkaline Phosphatase: 92 U/L (ref 38–126)
Anion gap: 13 (ref 5–15)
BUN: 33 mg/dL — ABNORMAL HIGH (ref 8–23)
CO2: 22 mmol/L (ref 22–32)
Calcium: 9.4 mg/dL (ref 8.9–10.3)
Chloride: 106 mmol/L (ref 98–111)
Creatinine: 1.77 mg/dL — ABNORMAL HIGH (ref 0.61–1.24)
GFR, Estimated: 40 mL/min — ABNORMAL LOW (ref >60)
Glucose, Bld: 92 mg/dL (ref 70–99)
Potassium: 4.4 mmol/L (ref 3.5–5.1)
Sodium: 141 mmol/L (ref 135–145)
Total Bilirubin: 0.3 mg/dL (ref 0.3–1.2)
Total Protein: 7.4 g/dL (ref 6.5–8.1)

## 2020-08-16 MED ORDER — SODIUM CHLORIDE 0.9 % IV SOLN
Freq: Once | INTRAVENOUS | Status: AC
  Filled 2020-08-16: qty 250

## 2020-08-16 MED ORDER — PROCHLORPERAZINE MALEATE 10 MG PO TABS: 10.0000 mg | ORAL_TABLET | Freq: Once | ORAL | Status: DC

## 2020-08-16 MED ORDER — PEMBROLIZUMAB CHEMO INJECTION 100 MG/4ML
200.0000 mg | Freq: Once | INTRAVENOUS | Status: AC
  Administered 2020-08-16: 200 mg via INTRAVENOUS
  Filled 2020-08-16: qty 8

## 2020-08-16 NOTE — Progress Notes (Signed)
Ok to treat with Scr of 1.77 per [employer], PA

## 2020-08-16 NOTE — Patient Instructions (Signed)
[facility] Discharge Instructions for Patients Receiving Chemotherapy  Today you received the following chemotherapy agents Pembrolizumab [facility]).  To help prevent nausea and vomiting after your treatment, we encourage you to take your nausea medication as prescribed.   If you develop nausea and vomiting that is not controlled by your nausea medication, call the clinic.   BELOW ARE SYMPTOMS THAT SHOULD BE REPORTED IMMEDIATELY:  *FEVER GREATER THAN 100.5 F  *CHILLS WITH OR WITHOUT FEVER  NAUSEA AND VOMITING THAT IS NOT CONTROLLED WITH YOUR NAUSEA MEDICATION  *UNUSUAL SHORTNESS OF BREATH  *UNUSUAL BRUISING OR BLEEDING  TENDERNESS IN MOUTH AND THROAT WITH OR WITHOUT PRESENCE OF ULCERS  *URINARY PROBLEMS  *BOWEL PROBLEMS  UNUSUAL RASH Items with * indicate a potential emergency and should be followed up as soon as possible.  Feel free to call the clinic should you have any questions or concerns. The clinic phone number is [phone].  Please show the [facility] at check-in to the Emergency Department and triage nurse.

## 2020-08-17 ENCOUNTER — Telehealth: Payer: Self-pay | Admitting: Internal Medicine

## 2020-08-17 NOTE — Telephone Encounter (Signed)
Scheduled per los. Called and left msg. Mailed printout  °

## 2020-08-24 ENCOUNTER — Ambulatory Visit
Admission: RE | Admit: 2020-08-24 | Discharge: 2020-08-24 | Disposition: A | Discharge status: Home / Self Care | Payer: Medicare Other | Source: Ambulatory Visit | Attending: Internal Medicine | Admitting: Internal Medicine

## 2020-08-24 ENCOUNTER — Other Ambulatory Visit: Payer: Self-pay | Admitting: Internal Medicine

## 2020-08-24 DIAGNOSIS — M542 Cervicalgia: Secondary | ICD-10-CM

## 2020-08-24 DIAGNOSIS — M19012 Primary osteoarthritis, left shoulder: Secondary | ICD-10-CM | POA: Diagnosis not present

## 2020-09-06 ENCOUNTER — Inpatient Hospital Stay: Payer: Medicare Other

## 2020-09-06 ENCOUNTER — Other Ambulatory Visit: Payer: Self-pay

## 2020-09-06 ENCOUNTER — Inpatient Hospital Stay: Payer: Medicare Other | Attending: Internal Medicine

## 2020-09-06 ENCOUNTER — Encounter: Payer: Self-pay | Admitting: Internal Medicine

## 2020-09-06 ENCOUNTER — Inpatient Hospital Stay: Payer: Medicare Other | Admitting: Internal Medicine

## 2020-09-06 VITALS — BP 119/66 | HR 79 | Temp 97.9°F | Resp 19 | Ht 69.0 in | Wt 180.8 lb

## 2020-09-06 DIAGNOSIS — Z5112 Encounter for antineoplastic immunotherapy: Secondary | ICD-10-CM | POA: Insufficient documentation

## 2020-09-06 DIAGNOSIS — I10 Essential (primary) hypertension: Secondary | ICD-10-CM

## 2020-09-06 DIAGNOSIS — Z79899 Other long term (current) drug therapy: Secondary | ICD-10-CM | POA: Insufficient documentation

## 2020-09-06 DIAGNOSIS — C7971 Secondary malignant neoplasm of right adrenal gland: Secondary | ICD-10-CM | POA: Insufficient documentation

## 2020-09-06 DIAGNOSIS — C3412 Malignant neoplasm of upper lobe, left bronchus or lung: Secondary | ICD-10-CM | POA: Diagnosis not present

## 2020-09-06 LAB — CMP (CANCER CENTER ONLY)
ALT: 23 U/L (ref 0–44)
AST: 21 U/L (ref 15–41)
Albumin: 3.3 g/dL — ABNORMAL LOW (ref 3.5–5.0)
Alkaline Phosphatase: 105 U/L (ref 38–126)
Anion gap: 13 (ref 5–15)
BUN: 26 mg/dL — ABNORMAL HIGH (ref 8–23)
CO2: 23 mmol/L (ref 22–32)
Calcium: 9.4 mg/dL (ref 8.9–10.3)
Chloride: 105 mmol/L (ref 98–111)
Creatinine: 1.72 mg/dL — ABNORMAL HIGH (ref 0.61–1.24)
GFR, Estimated: 41 mL/min — ABNORMAL LOW (ref >60)
Glucose, Bld: 200 mg/dL — ABNORMAL HIGH (ref 70–99)
Potassium: 4.4 mmol/L (ref 3.5–5.1)
Sodium: 141 mmol/L (ref 135–145)
Total Bilirubin: 0.3 mg/dL (ref 0.3–1.2)
Total Protein: 7 g/dL (ref 6.5–8.1)

## 2020-09-06 LAB — CBC WITH DIFFERENTIAL (CANCER CENTER ONLY)
Abs Immature Granulocytes: 0.04 10*3/uL (ref 0.00–0.07)
Basophils Absolute: 0 10*3/uL (ref 0.0–0.1)
Basophils Relative: 1 %
Eosinophils Absolute: 0.1 10*3/uL (ref 0.0–0.5)
Eosinophils Relative: 3 %
HCT: 34 % — ABNORMAL LOW (ref 39.0–52.0)
Hemoglobin: 11.3 g/dL — ABNORMAL LOW (ref 13.0–17.0)
Immature Granulocytes: 1 %
Lymphocytes Relative: 18 %
Lymphs Abs: 0.8 10*3/uL (ref 0.7–4.0)
MCH: 32.3 pg (ref 26.0–34.0)
MCHC: 33.2 g/dL (ref 30.0–36.0)
MCV: 97.1 fL (ref 80.0–100.0)
Monocytes Absolute: 0.4 10*3/uL (ref 0.1–1.0)
Monocytes Relative: 9 %
Neutro Abs: 3.1 10*3/uL (ref 1.7–7.7)
Neutrophils Relative %: 68 %
Platelet Count: 231 10*3/uL (ref 150–400)
RBC: 3.5 MIL/uL — ABNORMAL LOW (ref 4.22–5.81)
RDW: 13.6 % (ref 11.5–15.5)
WBC Count: 4.4 10*3/uL (ref 4.0–10.5)
nRBC: 0 % (ref 0.0–0.2)

## 2020-09-06 LAB — TSH: TSH: 0.705 u[IU]/mL (ref 0.320–4.118)

## 2020-09-06 MED ORDER — SODIUM CHLORIDE 0.9 % IV SOLN
200.0000 mg | Freq: Once | INTRAVENOUS | Status: AC
  Administered 2020-09-06: 200 mg via INTRAVENOUS
  Filled 2020-09-06: qty 8

## 2020-09-06 MED ORDER — SODIUM CHLORIDE 0.9 % IV SOLN
Freq: Once | INTRAVENOUS | Status: AC
  Filled 2020-09-06: qty 250

## 2020-09-06 NOTE — Progress Notes (Signed)
[place] [phone]   [phone]) [phone]  OFFICE PROGRESS NOTE  [name], MD [address] [place] [address]  DIAGNOSIS:  Stage IV (T1b, N3, M1c) non-small cell lung cancer, adenocarcinoma. 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  Biomarker Findings Tumor Mutational Burden - 10 Muts/Mb Microsatellite status - MS-Stable Genomic Findings For a complete list of the genes assayed, please refer to the Appendix. ERBB2 amplification - equivocal? SPQ33 A07* FANCC splice site 622+[id number]>F SMAD4 Q289* TP53 E298* 7 Disease relevant genes with no reportable alterations: ALK, BRAF, EGFR, KRAS, MET, RET, ROS1   PDL1 expression is 30%.  PRIOR THERAPY: 1) Weekly concurrent chemoradiation with carboplatin for an AUC of 2 and paclitaxel 45 mg/m2 to the locally advanced disease in the chest. He started the first dose 01/31/2019. Status post7cycles.Last dose of chemotherapy was given on March 14, 2019. 2) palliative radiotherapy to the metastatic disease in the right adrenal gland. 3) palliative radiotherapy to the metastatic deposit in the right obturator externus and abductor magnus under the care of Dr. [name].  CURRENT THERAPY: Systemic chemotherapy with carboplatin for AUC of 5, Alimta 500 mg/M2 and Keytruda 200 mg IV every 3 weeks. First dose April 20, 2019. Status post 24 cycles.   Starting from cycle #5, the patient is on maintenance treatment with Alimta and Keytruda every 3 weeks.  Recently the patient has been on treatment with single agent Keytruda secondary to renal insufficiency.  INTERVAL HISTORY: Trevor Bailey 75 y.o. male returns to the clinic today for follow-up visit.  The patient is feeling fine today with no concerning complaints.  He has  been tolerating his treatment fairly well.  He denied having any chest pain, shortness of breath, cough or hemoptysis.  He denied having any fever or chills.  He has no nausea, vomiting, diarrhea or constipation.  He is here today for evaluation before starting cycle #25.   MEDICAL HISTORY: Past Medical History:  Diagnosis Date  . Adenopathy    left supraclavicular lymph node  . Allergies   . COPD (chronic obstructive pulmonary disease) ([place])   . Coronary disease   . Diabetes ([place])   . Enlarged prostate   . GERD (gastroesophageal reflux disease)   . Glaucoma   . Hyperlipidemia   . Hypertension   . Myocardial infarction ([place])   . nscl ca dx'd 12/2018  . Sleep apnea    wears CPAP  . Wears partial dentures    upper and lower    ALLERGIES:  has No Known Allergies.  MEDICATIONS:  Current Outpatient Medications  Medication Sig Dispense Refill  . ACCU-CHEK GUIDE test strip     . albuterol (VENTOLIN HFA) 108 (90 Base) MCG/ACT inhaler     . amLODipine (NORVASC) 5 MG tablet TAKE 1 TABLET BY MOUTH  DAILY 90 tablet 3  . aspirin EC 81 MG tablet Take 81 mg by mouth daily.    Marland Kitchen atorvastatin (LIPITOR) 80 MG tablet Take 80 mg by mouth daily.    Marland Kitchen augmented betamethasone dipropionate (DIPROLENE-AF) 0.05 % cream     . budesonide-formoterol (SYMBICORT) 160-4.5 MCG/ACT inhaler Inhale 2 puffs into the lungs 2 (two) times daily.    Marland Kitchen CLARITIN 10 MG tablet Take 10 mg by mouth daily.     . clopidogrel (PLAVIX) 75  MG tablet Take 1 tablet (75 mg total) by mouth every evening. 30 tablet 0  . ezetimibe (ZETIA) 10 MG tablet Take 10 mg by mouth daily.    . finasteride (PROSCAR) 5 MG tablet Take 5 mg by mouth daily.     . folic acid (FOLVITE) 1 MG tablet TAKE 1 TABLET BY MOUTH  DAILY 90 tablet 3  . hydrochlorothiazide (HYDRODIURIL) 25 MG tablet Take 25 mg by mouth daily.     Marland Kitchen latanoprost (XALATAN) 0.005 % ophthalmic solution Place 1 drop into both eyes at bedtime.     . metFORMIN (GLUCOPHAGE) 1000 MG  tablet Take 1,000 mg by mouth 2 (two) times daily.     . metoprolol tartrate (LOPRESSOR) 25 MG tablet Take 25 mg 2 (two) times daily by mouth.    . mirtazapine (REMERON) 30 MG tablet TAKE 1 TABLET BY MOUTH AT  BEDTIME 90 tablet 3  . Multiple Vitamin (MULTIVITAMIN WITH MINERALS) TABS tablet Take 1 tablet by mouth daily. Centrum Silver    . nitroGLYCERIN (NITROSTAT) 0.4 MG SL tablet Place 1 tablet (0.4 mg total) under the tongue every 5 (five) minutes as needed for chest pain. 25 tablet 6  . ofloxacin (FLOXIN) 0.3 % OTIC solution     . OVER THE COUNTER MEDICATION Take 1 tablet by mouth daily as needed. Colace    . pantoprazole (PROTONIX) 40 MG tablet Take 40 mg daily by mouth.    Vladimir Faster Glycol-Propyl Glycol (SYSTANE) 0.4-0.3 % SOLN Place 1-2 drops into both eyes 3 (three) times daily as needed (dry/irritated eyes.).    Marland Kitchen prochlorperazine (COMPAZINE) 10 MG tablet Take 10 mg by mouth daily as needed for nausea or vomiting.    . ramipril (ALTACE) 10 MG capsule Take 10 mg by mouth 2 (two) times daily.     Marland Kitchen Respiratory Therapy Supplies (CARETOUCH 2 CPAP HOSE HANGER) MISC See admin instructions.     No current facility-administered medications for this visit.    SURGICAL HISTORY:  Past Surgical History:  Procedure Laterality Date  . ABDOMINAL AORTAGRAM Left 09/16/2013   Procedure: ABDOMINAL Maxcine Ham;  Surgeon: Elam Dutch, MD;  Location: [facility] CATH LAB;  Service: Cardiovascular;  Laterality: Left;  . CARDIAC CATHETERIZATION  01/2011   When there was segmental stenosis of the distal RCA, patent PCA stent, patent circumflex stent, and a patent small diagonal with 90% ISR.   Marland Kitchen COLONOSCOPY W/ BIOPSIES AND POLYPECTOMY    . CORONARY ANGIOPLASTY  may 2002   Non-DES stenting of his circumflex, non-DES stenting of the RCA  . HYDROCELE EXCISION / REPAIR  2009   by Dr Janice Norrie  . INGUINAL HERNIA REPAIR Bilateral    Dr Bubba Camp  . LYMPH NODE BIOPSY Left 01/10/2019   Procedure: left supraclavicular LYMPH  NODE BIOPSY;  Surgeon: [name], MD;  Location: [facility];  Service: Thoracic;  Laterality: Left;  Marland Kitchen MULTIPLE TOOTH EXTRACTIONS    . NM MYOCAR PERF WALL MOTION  01/08/2011   moderate in size and intensity area of reversible ischemia in the basal to mid inferior and septal territories. Abnormal study  . stents  2008   proximal RCA, DES for progession of disease.  Marland Kitchen US ECHOCARDIOGRAPHY  01/12/2012   mild concentric LVH, borderline LA enlargement, mild to mod TR    REVIEW OF SYSTEMS:  A comprehensive review of systems was negative.   PHYSICAL EXAMINATION: General appearance: alert, cooperative and no distress Head: Normocephalic, without obvious abnormality, atraumatic Neck: no adenopathy, no JVD,  supple, symmetrical, trachea midline and thyroid not enlarged, symmetric, no tenderness/mass/nodules Lymph nodes: Cervical, supraclavicular, and axillary nodes normal. Resp: clear to auscultation bilaterally Back: symmetric, no curvature. ROM normal. No CVA tenderness. Cardio: regular rate and rhythm, S1, S2 normal, no murmur, click, rub or gallop GI: soft, non-tender; bowel sounds normal; no masses,  no organomegaly Extremities: extremities normal, atraumatic, no cyanosis or edema  ECOG PERFORMANCE STATUS: 1 - Symptomatic but completely ambulatory  Blood pressure 119/66, pulse 79, temperature 97.9 F (36.6 C), temperature source Tympanic, resp. rate 19, height 5' 9" (1.753 m), weight 180 lb 12.8 oz (82 kg), SpO2 98 %.  LABORATORY DATA: Lab Results  Component Value Date   WBC 4.4 09/06/2020   HGB 11.3 (L) 09/06/2020   HCT 34.0 (L) 09/06/2020   MCV 97.1 09/06/2020   PLT 231 09/06/2020      Chemistry      Component Value Date/Time   NA 141 09/06/2020 1023   K 4.4 09/06/2020 1023   CL 105 09/06/2020 1023   CO2 23 09/06/2020 1023   BUN 26 (H) 09/06/2020 1023   CREATININE 1.72 (H) 09/06/2020 1023   CREATININE 0.94 05/16/2014 1109      Component Value Date/Time   CALCIUM 9.4  09/06/2020 1023   ALKPHOS 105 09/06/2020 1023   AST 21 09/06/2020 1023   ALT 23 09/06/2020 1023   BILITOT 0.3 09/06/2020 1023       RADIOGRAPHIC STUDIES: DG Cervical Spine 2 or 3 views  Result Date: 08/26/2020 CLINICAL DATA:  Neck pain without known injury. EXAM: CERVICAL SPINE - 2-3 VIEW COMPARISON:  None. FINDINGS: No fracture or spondylolisthesis is noted. Moderate degenerative disc disease is noted at C6-7 with anterior osteophyte formation. No prevertebral soft tissue swelling is noted. Bilateral carotid artery calcifications are noted. IMPRESSION: Moderate degenerative disc disease is noted at C6-7. No acute abnormality is noted. Bilateral carotid artery calcifications are noted. Carotid artery ultrasound is recommended for further evaluation. Electronically Signed   By: Marijo Conception M.D.   On: 08/26/2020 10:36   DG Shoulder Left  Result Date: 08/26/2020 CLINICAL DATA:  Left neck pain without known injury. EXAM: LEFT SHOULDER - 2+ VIEW COMPARISON:  None. FINDINGS: Deformity of the left proximal humerus is noted consistent with old healed fracture. No acute fracture or dislocation is noted at this time. Mild degenerative changes seen involving the left glenohumeral joint. IMPRESSION: Mild degenerative joint disease is seen involving the left glenohumeral joint. Probable old left proximal humeral fracture. No acute abnormality is noted. Electronically Signed   By: Marijo Conception M.D.   On: 08/26/2020 10:35    ASSESSMENT AND PLAN: This is a very pleasant 75 years old African-American male with stage IV non-small cell lung cancer, adenocarcinoma with no actionable mutation and PD-L1 expression of 30%, presented with locally advanced disease in the chest as well as solitary metastasis to the right adrenal gland. The patient completed a course of concurrent chemoradiation to the chest with weekly carboplatin for AUC of 2 and paclitaxel 45 mg/M2 status post 7 cycles..  The patient had  evidence for disease progression and he was started on systemic chemotherapy with carboplatin, Alimta and Keytruda for 4 cycles and he is currently on maintenance treatment with Alimta and Keytruda every 3 weeks status post 20 more cycles.  He is currently on treatment with single agent Keytruda because of the renal insufficiency. The patient continues to tolerate his treatment fairly well with no concerning adverse effects. I  recommended for him to proceed with cycle #20 one of his treatment as planned. He will come back for follow-up visit in 3 weeks for evaluation before the next cycle of treatment. The patient was advised to call immediately if he has any other concerning symptoms in the interval. For the lack of appetite, he will continue his current treatment with Remeron.  The patient voices understanding of current disease status and treatment options and is in agreement with the current care plan. All questions were answered. The patient knows to call the clinic with any problems, questions or concerns. We can certainly see the patient much sooner if necessary.   Disclaimer: This note was dictated with voice recognition software. Similar sounding words can inadvertently be transcribed and may not be corrected upon review.

## 2020-09-06 NOTE — Patient Instructions (Signed)

## 2020-09-06 NOTE — Progress Notes (Signed)
Per Dr Julien Nordmann it is okay to treat pt today with Keytruda and creatinine of 1.7

## 2020-09-12 DIAGNOSIS — I6523 Occlusion and stenosis of bilateral carotid arteries: Secondary | ICD-10-CM | POA: Diagnosis not present

## 2020-09-12 DIAGNOSIS — I6529 Occlusion and stenosis of unspecified carotid artery: Secondary | ICD-10-CM | POA: Diagnosis not present

## 2020-09-19 ENCOUNTER — Other Ambulatory Visit: Payer: Self-pay | Admitting: Internal Medicine

## 2020-09-23 NOTE — Progress Notes (Signed)
[facility] OFFICE PROGRESS NOTE  [name], MD [address] [place] [address]  DIAGNOSIS: Stage IV (T1b, N3,M1c) non-small cell lung cancer, adenocarcinoma. 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  Biomarker Findings Tumor Mutational Burden - 10 Muts/Mb Microsatellite status - MS-Stable Genomic Findings For a complete list of the genes assayed, please refer to the Appendix. ERBB2 amplification - equivocal? WKG88 P10* FANCC splice site 315+9Y>V SMAD4 Q289* TP53 E298* 7 Disease relevant genes with no reportable alterations: ALK, BRAF, EGFR, KRAS, MET, RET, ROS1   PDL1 expression is 30%.  PRIOR THERAPY: 1) Weekly concurrent chemoradiation with carboplatin for an AUC of 2 and paclitaxel 45 mg/m2 to the locally advanced disease in the chest. He started the first dose 01/31/2019. Status post7cycles.Last dose of chemotherapy was given on March 14, 2019. 2) palliative radiotherapy to the metastatic disease in the right adrenal gland. 3)Palliative radiotherapy to the enlarging muscular metastasis in the right hipwithin the right adductor muscle, medial to the lesser trochanter of the femur under the care of Dr. [name]. First day of radiation9/13/21  CURRENT THERAPY: Systemic chemotherapy with carboplatin for AUC of 5, Alimta 500 mg/M2 and Keytruda 200 mg IV every 3 weeks. First dose April 20, 2019. Status post25cycles.Starting from cycle #5, the patient began maintenance Alimta and Keytruda.His Alimta has been on hold since cycle #19 due to renal insuffiencey.  INTERVAL HISTORY: Trevor Bailey 75 y.o. male returns to the clinic for a follow up visit.The patient is feeling fairly welltoday without any concerning complaints.His Alimta has been held for the last few cycles  due to renal insufficiency with the exception of last cycle. He received reduced dose Alimta. Today, the patient denies any fever, chills, ornight sweats. He lost a few pounds due to being depressed because his younger sister passed away unexpectedly last week. He denies any chest pain, shortness of breath, or hemoptysis.He denies significant cough.He denies any nausea, vomiting, diarrhea, or constipation. He denies any headache or visual changes. He denies any rashes or skin changes. The patient is here today for evaluation and repeat blood work prior to starting cycle #26.   MEDICAL HISTORY: Past Medical History:  Diagnosis Date  . Adenopathy    left supraclavicular lymph node  . Allergies   . COPD (chronic obstructive pulmonary disease) ([place])   . Coronary disease   . Diabetes ([place])   . Enlarged prostate   . GERD (gastroesophageal reflux disease)   . Glaucoma   . Hyperlipidemia   . Hypertension   . Myocardial infarction ([place])   . nscl ca dx'd 12/2018  . Sleep apnea    wears CPAP  . Wears partial dentures    upper and lower    ALLERGIES:  has No Known Allergies.  MEDICATIONS:  Current Outpatient Medications  Medication Sig Dispense Refill  . ACCU-CHEK GUIDE test strip     . albuterol (VENTOLIN HFA) 108 (90 Base) MCG/ACT inhaler     . amLODipine (NORVASC) 5 MG tablet TAKE 1 TABLET BY MOUTH  DAILY 90 tablet 3  . aspirin EC 81 MG tablet Take 81 mg by mouth daily.    Marland Kitchen atorvastatin (LIPITOR) 80 MG tablet Take 80 mg by mouth daily.    Marland Kitchen augmented betamethasone dipropionate (DIPROLENE-AF) 0.05 % cream     . budesonide-formoterol (SYMBICORT) 160-4.5 MCG/ACT inhaler Inhale 2  puffs into the lungs 2 (two) times daily.    Marland Kitchen CLARITIN 10 MG tablet Take 10 mg by mouth daily.     . clopidogrel (PLAVIX) 75 MG tablet Take 1 tablet (75 mg total) by mouth every evening. 30 tablet 0  . ezetimibe (ZETIA) 10 MG tablet Take 10 mg by mouth daily.    . finasteride (PROSCAR) 5 MG tablet Take 5  mg by mouth daily.     . folic acid (FOLVITE) 1 MG tablet TAKE 1 TABLET BY MOUTH  DAILY 90 tablet 3  . hydrochlorothiazide (HYDRODIURIL) 25 MG tablet Take 25 mg by mouth daily.     Marland Kitchen latanoprost (XALATAN) 0.005 % ophthalmic solution Place 1 drop into both eyes at bedtime.     . metFORMIN (GLUCOPHAGE) 1000 MG tablet Take 1,000 mg by mouth 2 (two) times daily.     . metoprolol tartrate (LOPRESSOR) 25 MG tablet Take 25 mg 2 (two) times daily by mouth.    . mirtazapine (REMERON) 30 MG tablet TAKE 1 TABLET BY MOUTH AT  BEDTIME 90 tablet 3  . Multiple Vitamin (MULTIVITAMIN WITH MINERALS) TABS tablet Take 1 tablet by mouth daily. Centrum Silver    . nitroGLYCERIN (NITROSTAT) 0.4 MG SL tablet Place 1 tablet (0.4 mg total) under the tongue every 5 (five) minutes as needed for chest pain. 25 tablet 6  . ofloxacin (FLOXIN) 0.3 % OTIC solution     . OVER THE COUNTER MEDICATION Take 1 tablet by mouth daily as needed. Colace    . pantoprazole (PROTONIX) 40 MG tablet Take 40 mg daily by mouth.    Vladimir Faster Glycol-Propyl Glycol (SYSTANE) 0.4-0.3 % SOLN Place 1-2 drops into both eyes 3 (three) times daily as needed (dry/irritated eyes.).    Marland Kitchen prochlorperazine (COMPAZINE) 10 MG tablet Take 10 mg by mouth daily as needed for nausea or vomiting.    . ramipril (ALTACE) 10 MG capsule Take 10 mg by mouth 2 (two) times daily.     Marland Kitchen Respiratory Therapy Supplies (CARETOUCH 2 CPAP HOSE HANGER) MISC See admin instructions.     No current facility-administered medications for this visit.    SURGICAL HISTORY:  Past Surgical History:  Procedure Laterality Date  . ABDOMINAL AORTAGRAM Left 09/16/2013   Procedure: ABDOMINAL Maxcine Ham;  Surgeon: Elam Dutch, MD;  Location: [facility] CATH LAB;  Service: Cardiovascular;  Laterality: Left;  . CARDIAC CATHETERIZATION  01/2011   When there was segmental stenosis of the distal RCA, patent PCA stent, patent circumflex stent, and a patent small diagonal with 90% ISR.   Marland Kitchen COLONOSCOPY  W/ BIOPSIES AND POLYPECTOMY    . CORONARY ANGIOPLASTY  may 2002   Non-DES stenting of his circumflex, non-DES stenting of the RCA  . HYDROCELE EXCISION / REPAIR  2009   by Dr Janice Norrie  . INGUINAL HERNIA REPAIR Bilateral    Dr Bubba Camp  . LYMPH NODE BIOPSY Left 01/10/2019   Procedure: left supraclavicular LYMPH NODE BIOPSY;  Surgeon: [name], MD;  Location: [facility];  Service: Thoracic;  Laterality: Left;  Marland Kitchen MULTIPLE TOOTH EXTRACTIONS    . NM MYOCAR PERF WALL MOTION  01/08/2011   moderate in size and intensity area of reversible ischemia in the basal to mid inferior and septal territories. Abnormal study  . stents  2008   proximal RCA, DES for progession of disease.  Marland Kitchen US ECHOCARDIOGRAPHY  01/12/2012   mild concentric LVH, borderline LA enlargement, mild to mod TR    REVIEW OF SYSTEMS:  Review of Systems  Constitutional: Negative for appetite change, chills, fatigue, fever and unexpected weight change.  HENT: Negative for mouth sores, nosebleeds, sore throat and trouble swallowing.  Eyes: Negative for eye problems and icterus.  Respiratory: Negative for cough, hemoptysis, shortness of breath and wheezing.  Cardiovascular: Negative for chest pain and leg swelling.  Gastrointestinal: Negative for abdominal pain, constipation, diarrhea, nausea and vomiting.  Genitourinary: Negative for bladder incontinence, difficulty urinating, dysuria, frequency and hematuria.  Musculoskeletal: Negative for back pain, gait problem, neck pain and neck stiffness.  Skin: Negative for itching and rash.  Neurological: Negative for dizziness, extremity weakness, gait problem, headaches, light-headedness and seizures.  Hematological: Negative for adenopathy. Does not bruise/bleed easily.  Psychiatric/Behavioral: Negative for confusion, depression and sleep disturbance. The patient is not nervous/anxious.   PHYSICAL EXAMINATION:  Blood pressure 113/68, pulse 79, temperature 97.8 F (36.6 C),  temperature source Tympanic, resp. rate 19, height $RemoveBe'5\' 9"'RtPczwJfl$  (1.753 m), weight 177 lb 3.2 oz (80.4 kg), SpO2 100 %.  ECOG PERFORMANCE STATUS: 1 - Symptomatic but completely ambulatory  Physical Exam  Constitutional: Oriented to person, place, and time and well-developed, well-nourished, and in no distress.  HENT:  Head: Normocephalic and atraumatic.  Mouth/Throat: Oropharynx is clear and moist. No oropharyngeal exudate.  Eyes: Conjunctivae are normal. Right eye exhibits no discharge. Left eye exhibits no discharge. No scleral icterus.  Neck: Normal range of motion. Neck supple.  Cardiovascular: Normal rate, regular rhythm, normal heart sounds and intact distal pulses.  Pulmonary/Chest: Effort normal and breath sounds normal. No respiratory distress. No wheezes. No rales.  Abdominal: Soft. Bowel sounds are normal. Exhibits no distension and no mass. There is no tenderness.  Musculoskeletal: Normal range of motion. Exhibits no edema.  Lymphadenopathy:  No cervical adenopathy.  Neurological: Alert and oriented to person, place, and time. Exhibits normal muscle tone. Gait normal. Coordination normal.  Skin: Skin is warm and dry. No rash noted. Not diaphoretic. No erythema. No pallor.  Psychiatric: Mood, memory and judgment normal.  Vitals reviewed.  LABORATORY DATA: Lab Results  Component Value Date   WBC 3.6 (L) 09/27/2020   HGB 11.8 (L) 09/27/2020   HCT 35.8 (L) 09/27/2020   MCV 99.4 09/27/2020   PLT 229 09/27/2020      Chemistry      Component Value Date/Time   NA 138 09/27/2020 1105   K 4.8 09/27/2020 1105   CL 104 09/27/2020 1105   CO2 23 09/27/2020 1105   BUN 22 09/27/2020 1105   CREATININE 1.76 (H) 09/27/2020 1105   CREATININE 0.94 05/16/2014 1109      Component Value Date/Time   CALCIUM 9.6 09/27/2020 1105   ALKPHOS 113 09/27/2020 1105   AST 24 09/27/2020 1105   ALT 22 09/27/2020 1105   BILITOT 0.3 09/27/2020 1105       RADIOGRAPHIC STUDIES:  No results  found.   ASSESSMENT/PLAN:  This is a very pleasant 75year old African-American male with stage IV non-small cell lung cancer, adenocarcinoma with no actionable mutation and PD-L1 expression of 30%,presented with locally advanced disease in the chest as well as solitary metastasis to the right adrenal gland.He has no actionable mutations.  The patientcompleted a course of concurrent chemoradiation to the chest with weekly carboplatin for AUC of 2 and paclitaxel 45 mg/M2 status post7cycles.Last dose of chemotherapy was given on March 14, 2019. He also completed palliative radiotherapy to the right adrenal gland lesion under the care of Dr. [name].  He also completed palliative radiotherapy to  the muscular metastasis in the right hip in September 2021.  He is currentlyundergoing systemic chemotherapy with carboplatin for an AUC of 5, Alimta400 mg per metered squared and Keytruda 200 mg IV every 3 weeks. He is status post25cycles. He tolerated treatment well without any concerning adverse side effects except for mild fatigue.Starting from cycle #5,he has been on maintenance treatment with Alimta and Keytruda.Alimta has been on hold since cycle #19 due to renal insufficiency.  Labs were reviewed. His creatinine is 1.76today. Recommend that he proceedwith cycle #26 today scheduled withsingle agent Keytruda.  We will see the patient back for follow-up visit in 3 weeks for evaluation before starting cycle #27.  We will arrange for a restaging CT scan of the chest, abdomen, and pelvis at his next appointment.   The patient was advised to call immediately if he has any concerning symptoms in the interval. The patient voices understanding of current disease status and treatment options and is in agreement with the current care plan. All questions were answered. The patient knows to call the clinic with any problems, questions or concerns. We can certainly see the patient much  sooner if necessary       No orders of the defined types were placed in this encounter.    I spent 20-29 minutes in this encounter.   Trevor Quaranta L Quantavis Obryant, PA-C 09/27/20

## 2020-09-27 ENCOUNTER — Encounter: Payer: Self-pay | Admitting: Physician Assistant

## 2020-09-27 ENCOUNTER — Inpatient Hospital Stay: Payer: Medicare Other

## 2020-09-27 ENCOUNTER — Inpatient Hospital Stay: Payer: Medicare Other | Attending: Internal Medicine | Admitting: Physician Assistant

## 2020-09-27 ENCOUNTER — Other Ambulatory Visit: Payer: Self-pay

## 2020-09-27 VITALS — BP 113/68 | HR 79 | Temp 97.8°F | Resp 19 | Ht 69.0 in | Wt 177.2 lb

## 2020-09-27 DIAGNOSIS — C3412 Malignant neoplasm of upper lobe, left bronchus or lung: Secondary | ICD-10-CM | POA: Diagnosis not present

## 2020-09-27 DIAGNOSIS — Z5112 Encounter for antineoplastic immunotherapy: Secondary | ICD-10-CM | POA: Insufficient documentation

## 2020-09-27 DIAGNOSIS — C7989 Secondary malignant neoplasm of other specified sites: Secondary | ICD-10-CM | POA: Diagnosis not present

## 2020-09-27 DIAGNOSIS — C7971 Secondary malignant neoplasm of right adrenal gland: Secondary | ICD-10-CM | POA: Diagnosis not present

## 2020-09-27 DIAGNOSIS — Z79899 Other long term (current) drug therapy: Secondary | ICD-10-CM | POA: Diagnosis not present

## 2020-09-27 LAB — CBC WITH DIFFERENTIAL (CANCER CENTER ONLY)
Abs Immature Granulocytes: 0 10*3/uL (ref 0.00–0.07)
Basophils Absolute: 0 10*3/uL (ref 0.0–0.1)
Basophils Relative: 1 %
Eosinophils Absolute: 0.2 10*3/uL (ref 0.0–0.5)
Eosinophils Relative: 4 %
HCT: 35.8 % — ABNORMAL LOW (ref 39.0–52.0)
Hemoglobin: 11.8 g/dL — ABNORMAL LOW (ref 13.0–17.0)
Immature Granulocytes: 0 %
Lymphocytes Relative: 21 %
Lymphs Abs: 0.8 10*3/uL (ref 0.7–4.0)
MCH: 32.8 pg (ref 26.0–34.0)
MCHC: 33 g/dL (ref 30.0–36.0)
MCV: 99.4 fL (ref 80.0–100.0)
Monocytes Absolute: 0.3 10*3/uL (ref 0.1–1.0)
Monocytes Relative: 9 %
Neutro Abs: 2.4 10*3/uL (ref 1.7–7.7)
Neutrophils Relative %: 65 %
Platelet Count: 229 10*3/uL (ref 150–400)
RBC: 3.6 MIL/uL — ABNORMAL LOW (ref 4.22–5.81)
RDW: 14.5 % (ref 11.5–15.5)
WBC Count: 3.6 10*3/uL — ABNORMAL LOW (ref 4.0–10.5)
nRBC: 0 % (ref 0.0–0.2)

## 2020-09-27 LAB — CMP (CANCER CENTER ONLY)
ALT: 22 U/L (ref 0–44)
AST: 24 U/L (ref 15–41)
Albumin: 3.2 g/dL — ABNORMAL LOW (ref 3.5–5.0)
Alkaline Phosphatase: 113 U/L (ref 38–126)
Anion gap: 11 (ref 5–15)
BUN: 22 mg/dL (ref 8–23)
CO2: 23 mmol/L (ref 22–32)
Calcium: 9.6 mg/dL (ref 8.9–10.3)
Chloride: 104 mmol/L (ref 98–111)
Creatinine: 1.76 mg/dL — ABNORMAL HIGH (ref 0.61–1.24)
GFR, Estimated: 40 mL/min — ABNORMAL LOW (ref >60)
Glucose, Bld: 139 mg/dL — ABNORMAL HIGH (ref 70–99)
Potassium: 4.8 mmol/L (ref 3.5–5.1)
Sodium: 138 mmol/L (ref 135–145)
Total Bilirubin: 0.3 mg/dL (ref 0.3–1.2)
Total Protein: 7.4 g/dL (ref 6.5–8.1)

## 2020-09-27 LAB — TSH: TSH: 1.525 u[IU]/mL (ref 0.320–4.118)

## 2020-09-27 MED ORDER — SODIUM CHLORIDE 0.9 % IV SOLN
Freq: Once | INTRAVENOUS | Status: AC
  Filled 2020-09-27: qty 250

## 2020-09-27 MED ORDER — SODIUM CHLORIDE 0.9 % IV SOLN
200.0000 mg | Freq: Once | INTRAVENOUS | Status: AC
  Administered 2020-09-27: 200 mg via INTRAVENOUS
  Filled 2020-09-27: qty 8

## 2020-09-27 NOTE — Patient Instructions (Signed)
[facility]  Discharge Instructions: ?Thank you for choosing [facility] to provide your oncology and hematology care.  ? ?If you have a lab appointment with the Cancer Center, please go directly to the Cancer Center and check in at the registration area. ?  ?Wear comfortable clothing and clothing appropriate for easy access to any Portacath or PICC line.  ? ?We strive to give you quality time with your provider. You may need to reschedule your appointment if you arrive late (15 or more minutes).  Arriving late affects you and other patients whose appointments are after yours.  Also, if you miss three or more appointments without notifying the office, you may be dismissed from the clinic at the provider?s discretion.    ?  ?For prescription refill requests, have your pharmacy contact our office and allow 72 hours for refills to be completed.   ? ?Today you received the following chemotherapy and/or immunotherapy agents: Keytruda ?  ?To help prevent nausea and vomiting after your treatment, we encourage you to take your nausea medication as directed. ? ?BELOW ARE SYMPTOMS THAT SHOULD BE REPORTED IMMEDIATELY: ?*FEVER GREATER THAN 100.4 F (38 ?C) OR HIGHER ?*CHILLS OR SWEATING ?*NAUSEA AND VOMITING THAT IS NOT CONTROLLED WITH YOUR NAUSEA MEDICATION ?*UNUSUAL SHORTNESS OF BREATH ?*UNUSUAL BRUISING OR BLEEDING ?*URINARY PROBLEMS (pain or burning when urinating, or frequent urination) ?*BOWEL PROBLEMS (unusual diarrhea, constipation, pain near the anus) ?TENDERNESS IN MOUTH AND THROAT WITH OR WITHOUT PRESENCE OF ULCERS (sore throat, sores in mouth, or a toothache) ?UNUSUAL RASH, SWELLING OR PAIN  ?UNUSUAL VAGINAL DISCHARGE OR ITCHING  ? ?Items with * indicate a potential emergency and should be followed up as soon as possible or go to the Emergency Department if any problems should occur. ? ?Please show the CHEMOTHERAPY ALERT CARD or IMMUNOTHERAPY ALERT CARD at check-in to the  Emergency Department and triage nurse. ? ?Should you have questions after your visit or need to cancel or reschedule your appointment, please contact [facility]  Dept: [phone]  and follow the prompts.  Office hours are 8:00 a.m. to 4:30 p.m. Monday - Friday. Please note that voicemails left after 4:00 p.m. may not be returned until the following business day.  We are closed weekends and major holidays. You have access to a nurse at all times for urgent questions. Please call the main number to the clinic Dept: [phone] and follow the prompts. ? ? ?For any non-urgent questions, you may also contact your provider using MyChart. We now offer e-Visits for anyone 18 and older to request care online for non-urgent symptoms. For details visit [email or web address]. ?  ?Also download the MyChart app! Go to the app store, search "MyChart", open the app, select [facility], and log in with your MyChart username and password. ? ?Due to Covid, a mask is required upon entering the hospital/clinic. If you do not have a mask, one will be given to you upon arrival. For doctor visits, patients may have 1 support person aged 18 or older with them. For treatment visits, patients cannot have anyone with them due to current Covid guidelines and our immunocompromised population.  ? ?

## 2020-09-27 NOTE — Progress Notes (Signed)
Okay to treat with Cr of 1.76 per Cassie Heilingoepter, PA.

## 2020-10-01 DIAGNOSIS — H401131 Primary open-angle glaucoma, bilateral, mild stage: Secondary | ICD-10-CM | POA: Diagnosis not present

## 2020-10-02 DIAGNOSIS — J441 Chronic obstructive pulmonary disease with (acute) exacerbation: Secondary | ICD-10-CM | POA: Diagnosis not present

## 2020-10-02 DIAGNOSIS — I2510 Atherosclerotic heart disease of native coronary artery without angina pectoris: Secondary | ICD-10-CM | POA: Diagnosis not present

## 2020-10-02 DIAGNOSIS — E7800 Pure hypercholesterolemia, unspecified: Secondary | ICD-10-CM | POA: Diagnosis not present

## 2020-10-02 DIAGNOSIS — E1169 Type 2 diabetes mellitus with other specified complication: Secondary | ICD-10-CM | POA: Diagnosis not present

## 2020-10-02 DIAGNOSIS — E1159 Type 2 diabetes mellitus with other circulatory complications: Secondary | ICD-10-CM | POA: Diagnosis not present

## 2020-10-02 DIAGNOSIS — N1831 Chronic kidney disease, stage 3a: Secondary | ICD-10-CM | POA: Diagnosis not present

## 2020-10-02 DIAGNOSIS — E1165 Type 2 diabetes mellitus with hyperglycemia: Secondary | ICD-10-CM | POA: Diagnosis not present

## 2020-10-02 DIAGNOSIS — J449 Chronic obstructive pulmonary disease, unspecified: Secondary | ICD-10-CM | POA: Diagnosis not present

## 2020-10-02 DIAGNOSIS — I10 Essential (primary) hypertension: Secondary | ICD-10-CM | POA: Diagnosis not present

## 2020-10-02 DIAGNOSIS — E119 Type 2 diabetes mellitus without complications: Secondary | ICD-10-CM | POA: Diagnosis not present

## 2020-10-02 DIAGNOSIS — E1151 Type 2 diabetes mellitus with diabetic peripheral angiopathy without gangrene: Secondary | ICD-10-CM | POA: Diagnosis not present

## 2020-10-02 DIAGNOSIS — E782 Mixed hyperlipidemia: Secondary | ICD-10-CM | POA: Diagnosis not present

## 2020-10-18 ENCOUNTER — Inpatient Hospital Stay: Payer: Medicare Other | Attending: Internal Medicine | Admitting: Internal Medicine

## 2020-10-18 ENCOUNTER — Encounter: Payer: Self-pay | Admitting: Internal Medicine

## 2020-10-18 ENCOUNTER — Other Ambulatory Visit: Payer: Self-pay

## 2020-10-18 ENCOUNTER — Inpatient Hospital Stay: Payer: Medicare Other

## 2020-10-18 VITALS — BP 145/91 | HR 80 | Temp 98.0°F | Resp 18 | Ht 69.0 in | Wt 176.1 lb

## 2020-10-18 DIAGNOSIS — C3412 Malignant neoplasm of upper lobe, left bronchus or lung: Secondary | ICD-10-CM

## 2020-10-18 DIAGNOSIS — C770 Secondary and unspecified malignant neoplasm of lymph nodes of head, face and neck: Secondary | ICD-10-CM | POA: Diagnosis not present

## 2020-10-18 DIAGNOSIS — Z79899 Other long term (current) drug therapy: Secondary | ICD-10-CM | POA: Insufficient documentation

## 2020-10-18 DIAGNOSIS — Z5112 Encounter for antineoplastic immunotherapy: Secondary | ICD-10-CM | POA: Diagnosis not present

## 2020-10-18 DIAGNOSIS — C3490 Malignant neoplasm of unspecified part of unspecified bronchus or lung: Secondary | ICD-10-CM

## 2020-10-18 DIAGNOSIS — I10 Essential (primary) hypertension: Secondary | ICD-10-CM

## 2020-10-18 DIAGNOSIS — C7971 Secondary malignant neoplasm of right adrenal gland: Secondary | ICD-10-CM | POA: Diagnosis not present

## 2020-10-18 LAB — CBC WITH DIFFERENTIAL (CANCER CENTER ONLY)
Abs Immature Granulocytes: 0.01 10*3/uL (ref 0.00–0.07)
Basophils Absolute: 0 10*3/uL (ref 0.0–0.1)
Basophils Relative: 1 %
Eosinophils Absolute: 0.1 10*3/uL (ref 0.0–0.5)
Eosinophils Relative: 3 %
HCT: 33.1 % — ABNORMAL LOW (ref 39.0–52.0)
Hemoglobin: 10.8 g/dL — ABNORMAL LOW (ref 13.0–17.0)
Immature Granulocytes: 0 %
Lymphocytes Relative: 21 %
Lymphs Abs: 1 10*3/uL (ref 0.7–4.0)
MCH: 32.2 pg (ref 26.0–34.0)
MCHC: 32.6 g/dL (ref 30.0–36.0)
MCV: 98.8 fL (ref 80.0–100.0)
Monocytes Absolute: 0.6 10*3/uL (ref 0.1–1.0)
Monocytes Relative: 12 %
Neutro Abs: 3.1 10*3/uL (ref 1.7–7.7)
Neutrophils Relative %: 63 %
Platelet Count: 271 10*3/uL (ref 150–400)
RBC: 3.35 MIL/uL — ABNORMAL LOW (ref 4.22–5.81)
RDW: 13.8 % (ref 11.5–15.5)
WBC Count: 4.8 10*3/uL (ref 4.0–10.5)
nRBC: 0 % (ref 0.0–0.2)

## 2020-10-18 LAB — CMP (CANCER CENTER ONLY)
ALT: 17 U/L (ref 0–44)
AST: 17 U/L (ref 15–41)
Albumin: 3.1 g/dL — ABNORMAL LOW (ref 3.5–5.0)
Alkaline Phosphatase: 110 U/L (ref 38–126)
Anion gap: 12 (ref 5–15)
BUN: 26 mg/dL — ABNORMAL HIGH (ref 8–23)
CO2: 21 mmol/L — ABNORMAL LOW (ref 22–32)
Calcium: 10 mg/dL (ref 8.9–10.3)
Chloride: 109 mmol/L (ref 98–111)
Creatinine: 1.54 mg/dL — ABNORMAL HIGH (ref 0.61–1.24)
GFR, Estimated: 47 mL/min — ABNORMAL LOW (ref >60)
Glucose, Bld: 133 mg/dL — ABNORMAL HIGH (ref 70–99)
Potassium: 4.6 mmol/L (ref 3.5–5.1)
Sodium: 142 mmol/L (ref 135–145)
Total Bilirubin: 0.2 mg/dL — ABNORMAL LOW (ref 0.3–1.2)
Total Protein: 7.4 g/dL (ref 6.5–8.1)

## 2020-10-18 LAB — TSH: TSH: 1.472 u[IU]/mL (ref 0.320–4.118)

## 2020-10-18 MED ORDER — SODIUM CHLORIDE 0.9 % IV SOLN
200.0000 mg | Freq: Once | INTRAVENOUS | Status: AC
  Administered 2020-10-18: 200 mg via INTRAVENOUS
  Filled 2020-10-18: qty 8

## 2020-10-18 MED ORDER — SODIUM CHLORIDE 0.9 % IV SOLN
Freq: Once | INTRAVENOUS | Status: AC
  Filled 2020-10-18: qty 250

## 2020-10-18 NOTE — Progress Notes (Signed)
Trev[facility]   [phone]  OFFICE PROGRESS NOTE  Trevor Carol, MD [address] [place] [address]  DIAGNOSIS:  Stage IV (T1b, N3, M1c) non-small cell lung cancer, adenocarcinoma. 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  Biomarker Findings Tumor Mutational Burden - 10 Muts/Mb Microsatellite status - MS-Stable Genomic Findings For a complete list of the genes assayed, please refer to the Appendix. ERBB2 amplification - equivocal? ZRA07 M22* FANCC splice site 633+3L>K SMAD4 Q289* TP53 E298* 7 Disease relevant genes with no reportable alterations: ALK, BRAF, EGFR, KRAS, MET, RET, ROS1   PDL1 expression is 30%.  PRIOR THERAPY: 1) Weekly concurrent chemoradiation with carboplatin for an AUC of 2 and paclitaxel 45 mg/m2 to the locally advanced disease in the chest. He started the first dose 01/31/2019. Status post7cycles.Last dose of chemotherapy was given on March 14, 2019. 2) palliative radiotherapy to the metastatic disease in the right adrenal gland. 3) palliative radiotherapy to the metastatic deposit in the right obturator externus and abductor magnus under the care of Dr. [name].  CURRENT THERAPY: Systemic chemotherapy with carboplatin for AUC of 5, Alimta 500 mg/M2 and Keytruda 200 mg IV every 3 weeks. First dose April 20, 2019. Status post 26 cycles.   Starting from cycle #5, the patient is on maintenance treatment with Alimta and Keytruda every 3 weeks.  Recently the patient has been on treatment with single agent Keytruda secondary to renal insufficiency.  INTERVAL HISTORY: Trevor Bailey 75 y.o. male returns to the clinic today for follow-up visit.  The patient is feeling fine today with no concerning complaints.  He denied  having any chest pain, shortness of breath, cough or hemoptysis.  He denied having any fever or chills.  He has no nausea, vomiting, diarrhea or constipation.  He denied having any headache or visual changes.  He has no weight loss or night sweats.  The patient is here today for evaluation before starting cycle #27 of his treatment.  MEDICAL HISTORY: Past Medical History:  Diagnosis Date  . Adenopathy    left supraclavicular lymph node  . Allergies   . COPD (chronic obstructive pulmonary disease) ([place])   . Coronary disease   . Diabetes ([place])   . Enlarged prostate   . GERD (gastroesophageal reflux disease)   . Glaucoma   . Hyperlipidemia   . Hypertension   . Myocardial infarction ([place])   . nscl ca dx'd 12/2018  . Sleep apnea    wears CPAP  . Wears partial dentures    upper and lower    ALLERGIES:  has No Known Allergies.  MEDICATIONS:  Current Outpatient Medications  Medication Sig Dispense Refill  . ACCU-CHEK GUIDE test strip     . albuterol (VENTOLIN HFA) 108 (90 Base) MCG/ACT inhaler     . amLODipine (NORVASC) 5 MG tablet TAKE 1 TABLET BY MOUTH  DAILY 90 tablet 3  . aspirin EC 81 MG tablet Take 81 mg by mouth daily.    Marland Kitchen atorvastatin (LIPITOR) 80 MG tablet Take 80 mg by mouth daily.    . budesonide-formoterol (SYMBICORT) 160-4.5 MCG/ACT inhaler Inhale 2 puffs into the lungs 2 (two) times daily.    Marland Kitchen CLARITIN 10 MG tablet Take 10 mg by mouth daily.     . clopidogrel (PLAVIX) 75  MG tablet Take 1 tablet (75 mg total) by mouth every evening. 30 tablet 0  . ezetimibe (ZETIA) 10 MG tablet Take 10 mg by mouth daily.    . finasteride (PROSCAR) 5 MG tablet Take 5 mg by mouth daily.     . folic acid (FOLVITE) 1 MG tablet TAKE 1 TABLET BY MOUTH  DAILY 90 tablet 3  . hydrochlorothiazide (HYDRODIURIL) 25 MG tablet Take 25 mg by mouth daily.     Marland Kitchen latanoprost (XALATAN) 0.005 % ophthalmic solution Place 1 drop into both eyes at bedtime.     . metFORMIN (GLUCOPHAGE) 1000 MG tablet Take  1,000 mg by mouth 2 (two) times daily.     . metoprolol tartrate (LOPRESSOR) 25 MG tablet Take 25 mg 2 (two) times daily by mouth.    . mirtazapine (REMERON) 30 MG tablet TAKE 1 TABLET BY MOUTH AT  BEDTIME 90 tablet 3  . Multiple Vitamin (MULTIVITAMIN WITH MINERALS) TABS tablet Take 1 tablet by mouth daily. Centrum Silver    . nitroGLYCERIN (NITROSTAT) 0.4 MG SL tablet Place 1 tablet (0.4 mg total) under the tongue every 5 (five) minutes as needed for chest pain. 25 tablet 6  . ofloxacin (FLOXIN) 0.3 % OTIC solution     . OVER THE COUNTER MEDICATION Take 1 tablet by mouth daily as needed. Colace    . pantoprazole (PROTONIX) 40 MG tablet Take 40 mg daily by mouth.    Vladimir Faster Glycol-Propyl Glycol (SYSTANE) 0.4-0.3 % SOLN Place 1-2 drops into both eyes 3 (three) times daily as needed (dry/irritated eyes.).    Marland Kitchen prochlorperazine (COMPAZINE) 10 MG tablet Take 10 mg by mouth daily as needed for nausea or vomiting.    . ramipril (ALTACE) 10 MG capsule Take 10 mg by mouth 2 (two) times daily.     Marland Kitchen Respiratory Therapy Supplies (CARETOUCH 2 CPAP HOSE HANGER) MISC See admin instructions.     No current facility-administered medications for this visit.    SURGICAL HISTORY:  Past Surgical History:  Procedure Laterality Date  . ABDOMINAL AORTAGRAM Left 09/16/2013   Procedure: ABDOMINAL Maxcine Ham;  Surgeon: Elam Dutch, MD;  Location: [facility] CATH LAB;  Service: Cardiovascular;  Laterality: Left;  . CARDIAC CATHETERIZATION  01/2011   When there was segmental stenosis of the distal RCA, patent PCA stent, patent circumflex stent, and a patent small diagonal with 90% ISR.   Marland Kitchen COLONOSCOPY W/ BIOPSIES AND POLYPECTOMY    . CORONARY ANGIOPLASTY  may 2002   Non-DES stenting of his circumflex, non-DES stenting of the RCA  . HYDROCELE EXCISION / REPAIR  2009   by Dr Janice Norrie  . INGUINAL HERNIA REPAIR Bilateral    Dr Bubba Camp  . LYMPH NODE BIOPSY Left 01/10/2019   Procedure: left supraclavicular LYMPH NODE BIOPSY;   Surgeon: [name], MD;  Location: [facility];  Service: Thoracic;  Laterality: Left;  Marland Kitchen MULTIPLE TOOTH EXTRACTIONS    . NM MYOCAR PERF WALL MOTION  01/08/2011   moderate in size and intensity area of reversible ischemia in the basal to mid inferior and septal territories. Abnormal study  . stents  2008   proximal RCA, DES for progession of disease.  Marland Kitchen US ECHOCARDIOGRAPHY  01/12/2012   mild concentric LVH, borderline LA enlargement, mild to mod TR    REVIEW OF SYSTEMS:  A comprehensive review of systems was negative.   PHYSICAL EXAMINATION: General appearance: alert, cooperative and no distress Head: Normocephalic, without obvious abnormality, atraumatic Neck: no adenopathy, no JVD,  supple, symmetrical, trachea midline and thyroid not enlarged, symmetric, no tenderness/mass/nodules Lymph nodes: Cervical, supraclavicular, and axillary nodes normal. Resp: clear to auscultation bilaterally Back: symmetric, no curvature. ROM normal. No CVA tenderness. Cardio: regular rate and rhythm, S1, S2 normal, no murmur, click, rub or gallop GI: soft, non-tender; bowel sounds normal; no masses,  no organomegaly Extremities: extremities normal, atraumatic, no cyanosis or edema  ECOG PERFORMANCE STATUS: 1 - Symptomatic but completely ambulatory  Blood pressure (!) 145/91, pulse 80, temperature 98 F (36.7 C), temperature source Tympanic, resp. rate 18, height _0  (1.753 m), weight 176 lb 1.6 oz (79.9 kg), SpO2 100 %.  LABORATORY DATA: Lab Results  Component Value Date   WBC 4.8 10/18/2020   HGB 10.8 (L) 10/18/2020   HCT 33.1 (L) 10/18/2020   MCV 98.8 10/18/2020   PLT 271 10/18/2020      Chemistry      Component Value Date/Time   NA 138 09/27/2020 1105   K 4.8 09/27/2020 1105   CL 104 09/27/2020 1105   CO2 23 09/27/2020 1105   BUN 22 09/27/2020 1105   CREATININE 1.76 (H) 09/27/2020 1105   CREATININE 0.94 05/16/2014 1109      Component Value Date/Time   CALCIUM 9.6 09/27/2020 1105    ALKPHOS 113 09/27/2020 1105   AST 24 09/27/2020 1105   ALT 22 09/27/2020 1105   BILITOT 0.3 09/27/2020 1105       RADIOGRAPHIC STUDIES: No results found.  ASSESSMENT AND PLAN: This is a very pleasant 75 years old African-American male with stage IV non-small cell lung cancer, adenocarcinoma with no actionable mutation and PD-L1 expression of 30%, presented with locally advanced disease in the chest as well as solitary metastasis to the right adrenal gland. The patient completed a course of concurrent chemoradiation to the chest with weekly carboplatin for AUC of 2 and paclitaxel 45 mg/M2 status post 7 cycles..  The patient had evidence for disease progression and he was started on systemic chemotherapy with carboplatin, Alimta and Keytruda for 4 cycles and he is currently on maintenance treatment with Alimta and Keytruda every 3 weeks status post 22 more cycles.  He is currently on treatment with single agent Keytruda because of the renal insufficiency. The patient continues to tolerate this treatment well with no concerning adverse effects. I recommended for him to proceed with cycle #23 of the maintenance therapy as planned. He will come back for follow-up visit in 3 weeks for evaluation with repeat CT scan of the chest, abdomen and pelvis for restaging of his disease. The patient was advised to call immediately if he has any concerning symptoms in the interval. For the lack of appetite, he will continue his current treatment with Remeron.  The patient voices understanding of current disease status and treatment options and is in agreement with the current care plan. All questions were answered. The patient knows to call the clinic with any problems, questions or concerns. We can certainly see the patient much sooner if necessary.   Disclaimer: This note was dictated with voice recognition software. Similar sounding words can inadvertently be transcribed and may not be corrected upon  review.

## 2020-10-18 NOTE — Patient Instructions (Signed)
[facility]   Discharge Instructions: Thank you for choosing [facility] to provide your oncology and hematology care.   If you have a lab appointment with the [facility], please go directly to the [facility] and check in at the registration area.   Wear comfortable clothing and clothing appropriate for easy access to any Portacath or PICC line.   We strive to give you quality time with your provider. You may need to reschedule your appointment if you arrive late (15 or more minutes).  Arriving late affects you and other patients whose appointments are after yours.  Also, if you miss three or more appointments without notifying the office, you may be dismissed from the clinic at the provider's discretion.      For prescription refill requests, have your pharmacy contact our office and allow 72 hours for refills to be completed.    Today you received the following chemotherapy and/or immunotherapy agents: Pembrolizumab Beryle Flock)   To help prevent nausea and vomiting after your treatment, we encourage you to take your nausea medication as directed.  BELOW ARE SYMPTOMS THAT SHOULD BE REPORTED IMMEDIATELY: . *FEVER GREATER THAN 100.4 F (38 C) OR HIGHER . *CHILLS OR SWEATING . *NAUSEA AND VOMITING THAT IS NOT CONTROLLED WITH YOUR NAUSEA MEDICATION . *UNUSUAL SHORTNESS OF BREATH . *UNUSUAL BRUISING OR BLEEDING . *URINARY PROBLEMS (pain or burning when urinating, or frequent urination) . *BOWEL PROBLEMS (unusual diarrhea, constipation, pain near the anus) . TENDERNESS IN MOUTH AND THROAT WITH OR WITHOUT PRESENCE OF ULCERS (sore throat, sores in mouth, or a toothache) . UNUSUAL RASH, SWELLING OR PAIN  . UNUSUAL VAGINAL DISCHARGE OR ITCHING   Items with * indicate a potential emergency and should be followed up as soon as possible or go to the Emergency Department if any problems should occur.  Please show the CHEMOTHERAPY ALERT CARD or  IMMUNOTHERAPY ALERT CARD at check-in to the Emergency Department and triage nurse.  Should you have questions after your visit or need to cancel or reschedule your appointment, please contact [facility]  Dept: [phone]  and follow the prompts.  Office hours are 8:00 a.m. to 4:30 p.m. Monday - Friday. Please note that voicemails left after 4:00 p.m. may not be returned until the following business day.  We are closed weekends and major holidays. You have access to a nurse at all times for urgent questions. Please call the main number to the clinic Dept: [phone] and follow the prompts.   For any non-urgent questions, you may also contact your provider using MyChart. We now offer e-Visits for anyone 47 and older to request care online for non-urgent symptoms. For details visit [email or web address].   Also download the MyChart app! Go to the app store, search "MyChart", open the app, select [facility], and log in with your MyChart username and password.  Due to Covid, a mask is required upon entering the hospital/clinic. If you do not have a mask, one will be given to you upon arrival. For doctor visits, patients may have 1 support person aged 65 or older with them. For treatment visits, patients cannot have anyone with them due to current Covid guidelines and our immunocompromised population.

## 2020-10-24 ENCOUNTER — Other Ambulatory Visit: Payer: Self-pay | Admitting: Cardiovascular Disease

## 2020-10-24 ENCOUNTER — Other Ambulatory Visit: Payer: Self-pay | Admitting: Internal Medicine

## 2020-10-30 DIAGNOSIS — M25512 Pain in left shoulder: Secondary | ICD-10-CM | POA: Diagnosis not present

## 2020-11-01 ENCOUNTER — Ambulatory Visit (HOSPITAL_COMMUNITY)
Admission: RE | Admit: 2020-11-01 | Discharge: 2020-11-01 | Disposition: A | Discharge status: Home / Self Care | Payer: Medicare Other | Source: Ambulatory Visit | Attending: Internal Medicine | Admitting: Internal Medicine

## 2020-11-01 ENCOUNTER — Other Ambulatory Visit: Payer: Self-pay

## 2020-11-01 DIAGNOSIS — C3412 Malignant neoplasm of upper lobe, left bronchus or lung: Secondary | ICD-10-CM | POA: Diagnosis not present

## 2020-11-01 DIAGNOSIS — J700 Acute pulmonary manifestations due to radiation: Secondary | ICD-10-CM | POA: Diagnosis not present

## 2020-11-01 DIAGNOSIS — K769 Liver disease, unspecified: Secondary | ICD-10-CM | POA: Diagnosis not present

## 2020-11-01 DIAGNOSIS — I2510 Atherosclerotic heart disease of native coronary artery without angina pectoris: Secondary | ICD-10-CM | POA: Diagnosis not present

## 2020-11-01 DIAGNOSIS — K5750 Diverticulosis of both small and large intestine without perforation or abscess without bleeding: Secondary | ICD-10-CM | POA: Diagnosis not present

## 2020-11-01 DIAGNOSIS — N3289 Other specified disorders of bladder: Secondary | ICD-10-CM | POA: Diagnosis not present

## 2020-11-01 DIAGNOSIS — J941 Fibrothorax: Secondary | ICD-10-CM | POA: Diagnosis not present

## 2020-11-01 DIAGNOSIS — J90 Pleural effusion, not elsewhere classified: Secondary | ICD-10-CM | POA: Diagnosis not present

## 2020-11-01 DIAGNOSIS — C3490 Malignant neoplasm of unspecified part of unspecified bronchus or lung: Secondary | ICD-10-CM | POA: Diagnosis not present
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
## 2020-11-08 ENCOUNTER — Inpatient Hospital Stay (HOSPITAL_BASED_OUTPATIENT_CLINIC_OR_DEPARTMENT_OTHER): Payer: Medicare Other | Admitting: Physician Assistant

## 2020-11-08 ENCOUNTER — Other Ambulatory Visit: Payer: Self-pay

## 2020-11-08 ENCOUNTER — Inpatient Hospital Stay: Payer: Medicare Other

## 2020-11-08 VITALS — BP 108/69 | HR 83 | Temp 97.0°F | Resp 17 | Ht 69.0 in | Wt 174.2 lb

## 2020-11-08 DIAGNOSIS — C3412 Malignant neoplasm of upper lobe, left bronchus or lung: Secondary | ICD-10-CM

## 2020-11-08 DIAGNOSIS — Z79899 Other long term (current) drug therapy: Secondary | ICD-10-CM | POA: Diagnosis not present

## 2020-11-08 DIAGNOSIS — Z5112 Encounter for antineoplastic immunotherapy: Secondary | ICD-10-CM | POA: Diagnosis not present

## 2020-11-08 DIAGNOSIS — C7971 Secondary malignant neoplasm of right adrenal gland: Secondary | ICD-10-CM | POA: Diagnosis not present

## 2020-11-08 DIAGNOSIS — C770 Secondary and unspecified malignant neoplasm of lymph nodes of head, face and neck: Secondary | ICD-10-CM | POA: Diagnosis not present

## 2020-11-08 LAB — CBC WITH DIFFERENTIAL (CANCER CENTER ONLY)
Abs Immature Granulocytes: 0.01 10*3/uL (ref 0.00–0.07)
Basophils Absolute: 0 10*3/uL (ref 0.0–0.1)
Basophils Relative: 1 %
Eosinophils Absolute: 0.2 10*3/uL (ref 0.0–0.5)
Eosinophils Relative: 3 %
HCT: 33.5 % — ABNORMAL LOW (ref 39.0–52.0)
Hemoglobin: 11.1 g/dL — ABNORMAL LOW (ref 13.0–17.0)
Immature Granulocytes: 0 %
Lymphocytes Relative: 12 %
Lymphs Abs: 0.6 10*3/uL — ABNORMAL LOW (ref 0.7–4.0)
MCH: 33 pg (ref 26.0–34.0)
MCHC: 33.1 g/dL (ref 30.0–36.0)
MCV: 99.7 fL (ref 80.0–100.0)
Monocytes Absolute: 0.6 10*3/uL (ref 0.1–1.0)
Monocytes Relative: 12 %
Neutro Abs: 3.6 10*3/uL (ref 1.7–7.7)
Neutrophils Relative %: 72 %
Platelet Count: 276 10*3/uL (ref 150–400)
RBC: 3.36 MIL/uL — ABNORMAL LOW (ref 4.22–5.81)
RDW: 13.5 % (ref 11.5–15.5)
WBC Count: 5 10*3/uL (ref 4.0–10.5)
nRBC: 0 % (ref 0.0–0.2)

## 2020-11-08 LAB — CMP (CANCER CENTER ONLY)
ALT: 17 U/L (ref 0–44)
AST: 16 U/L (ref 15–41)
Albumin: 3 g/dL — ABNORMAL LOW (ref 3.5–5.0)
Alkaline Phosphatase: 103 U/L (ref 38–126)
Anion gap: 13 (ref 5–15)
BUN: 26 mg/dL — ABNORMAL HIGH (ref 8–23)
CO2: 20 mmol/L — ABNORMAL LOW (ref 22–32)
Calcium: 10.1 mg/dL (ref 8.9–10.3)
Chloride: 106 mmol/L (ref 98–111)
Creatinine: 1.71 mg/dL — ABNORMAL HIGH (ref 0.61–1.24)
GFR, Estimated: 41 mL/min — ABNORMAL LOW (ref >60)
Glucose, Bld: 197 mg/dL — ABNORMAL HIGH (ref 70–99)
Potassium: 4.4 mmol/L (ref 3.5–5.1)
Sodium: 139 mmol/L (ref 135–145)
Total Bilirubin: 0.3 mg/dL (ref 0.3–1.2)
Total Protein: 7.4 g/dL (ref 6.5–8.1)

## 2020-11-08 LAB — TSH: TSH: 0.929 u[IU]/mL (ref 0.320–4.118)

## 2020-11-08 MED ORDER — SODIUM CHLORIDE 0.9 % IV SOLN
Freq: Once | INTRAVENOUS | Status: AC
  Filled 2020-11-08: qty 250

## 2020-11-08 MED ORDER — SODIUM CHLORIDE 0.9 % IV SOLN
200.0000 mg | Freq: Once | INTRAVENOUS | Status: AC
  Administered 2020-11-08: 200 mg via INTRAVENOUS
  Filled 2020-11-08: qty 8

## 2020-11-08 NOTE — Patient Instructions (Signed)
[facility]COL[facility]   Discharge Instructions: Thank you for choosing [facility] to provide your oncology and hematology care.   If you have a lab appointment with the [facility], please go directly to the [facility] and check in at the registration area.   Wear comfortable clothing and clothing appropriate for easy access to any Portacath or PICC line.   We strive to give you quality time with your provider. You may need to reschedule your appointment if you arrive late (15 or more minutes).  Arriving late affects you and other patients whose appointments are after yours.  Also, if you miss three or more appointments without notifying the office, you may be dismissed from the clinic at the provider's discretion.      For prescription refill requests, have your pharmacy contact our office and allow 72 hours for refills to be completed.    Today you received the following chemotherapy and/or immunotherapy agents: Pembrolizumab Beryle Flock)   To help prevent nausea and vomiting after your treatment, we encourage you to take your nausea medication as directed.  BELOW ARE SYMPTOMS THAT SHOULD BE REPORTED IMMEDIATELY: *FEVER GREATER THAN 100.4 F (38 C) OR HIGHER *CHILLS OR SWEATING *NAUSEA AND VOMITING THAT IS NOT CONTROLLED WITH YOUR NAUSEA MEDICATION *UNUSUAL SHORTNESS OF BREATH *UNUSUAL BRUISING OR BLEEDING *URINARY PROBLEMS (pain or burning when urinating, or frequent urination) *BOWEL PROBLEMS (unusual diarrhea, constipation, pain near the anus) TENDERNESS IN MOUTH AND THROAT WITH OR WITHOUT PRESENCE OF ULCERS (sore throat, sores in mouth, or a toothache) UNUSUAL RASH, SWELLING OR PAIN  UNUSUAL VAGINAL DISCHARGE OR ITCHING   Items with * indicate a potential emergency and should be followed up as soon as possible or go to the Emergency Department if any problems should occur.  Please show the CHEMOTHERAPY ALERT CARD or IMMUNOTHERAPY ALERT CARD  at check-in to the Emergency Department and triage nurse.  Should you have questions after your visit or need to cancel or reschedule your appointment, please contact [facility]  Dept: [phone]  and follow the prompts.  Office hours are 8:00 a.m. to 4:30 p.m. Monday - Friday. Please note that voicemails left after 4:00 p.m. may not be returned until the following business day.  We are closed weekends and major holidays. You have access to a nurse at all times for urgent questions. Please call the main number to the clinic Dept: [phone] and follow the prompts.   For any non-urgent questions, you may also contact your provider using MyChart. We now offer e-Visits for anyone 53 and older to request care online for non-urgent symptoms. For details visit [email or web address].   Also download the MyChart app! Go to the app store, search "MyChart", open the app, select [facility], and log in with your MyChart username and password.  Due to Covid, a mask is required upon entering the hospital/clinic. If you do not have a mask, one will be given to you upon arrival. For doctor visits, patients may have 1 support person aged 51 or older with them. For treatment visits, patients cannot have anyone with them due to current Covid guidelines and our immunocompromised population.

## 2020-11-13 ENCOUNTER — Telehealth: Payer: Self-pay | Admitting: Internal Medicine

## 2020-11-13 NOTE — Telephone Encounter (Signed)
Scheduled per 06/02 los, patient has been called and notified of all upcoming appointments.

## 2020-11-14 DIAGNOSIS — M25512 Pain in left shoulder: Secondary | ICD-10-CM | POA: Diagnosis not present

## 2020-11-23 DIAGNOSIS — M19012 Primary osteoarthritis, left shoulder: Secondary | ICD-10-CM | POA: Diagnosis not present

## 2020-11-29 ENCOUNTER — Inpatient Hospital Stay: Payer: Medicare Other | Attending: Internal Medicine

## 2020-11-29 ENCOUNTER — Encounter: Payer: Self-pay | Admitting: Internal Medicine

## 2020-11-29 ENCOUNTER — Inpatient Hospital Stay: Payer: Medicare Other

## 2020-11-29 ENCOUNTER — Inpatient Hospital Stay (HOSPITAL_BASED_OUTPATIENT_CLINIC_OR_DEPARTMENT_OTHER): Payer: Medicare Other | Admitting: Internal Medicine

## 2020-11-29 ENCOUNTER — Other Ambulatory Visit: Payer: Self-pay

## 2020-11-29 VITALS — BP 142/88 | HR 82 | Temp 98.8°F | Resp 17 | Ht 69.0 in | Wt 172.4 lb

## 2020-11-29 DIAGNOSIS — C3412 Malignant neoplasm of upper lobe, left bronchus or lung: Secondary | ICD-10-CM | POA: Diagnosis not present

## 2020-11-29 DIAGNOSIS — Z79899 Other long term (current) drug therapy: Secondary | ICD-10-CM | POA: Diagnosis not present

## 2020-11-29 DIAGNOSIS — I10 Essential (primary) hypertension: Secondary | ICD-10-CM | POA: Diagnosis not present

## 2020-11-29 DIAGNOSIS — C7989 Secondary malignant neoplasm of other specified sites: Secondary | ICD-10-CM | POA: Diagnosis not present

## 2020-11-29 DIAGNOSIS — C770 Secondary and unspecified malignant neoplasm of lymph nodes of head, face and neck: Secondary | ICD-10-CM | POA: Diagnosis not present

## 2020-11-29 DIAGNOSIS — C7971 Secondary malignant neoplasm of right adrenal gland: Secondary | ICD-10-CM | POA: Diagnosis not present

## 2020-11-29 DIAGNOSIS — Z5112 Encounter for antineoplastic immunotherapy: Secondary | ICD-10-CM | POA: Diagnosis not present

## 2020-11-29 LAB — CMP (CANCER CENTER ONLY)
ALT: 20 U/L (ref 0–44)
AST: 17 U/L (ref 15–41)
Albumin: 2.9 g/dL — ABNORMAL LOW (ref 3.5–5.0)
Alkaline Phosphatase: 109 U/L (ref 38–126)
Anion gap: 10 (ref 5–15)
BUN: 24 mg/dL — ABNORMAL HIGH (ref 8–23)
CO2: 25 mmol/L (ref 22–32)
Calcium: 10 mg/dL (ref 8.9–10.3)
Chloride: 107 mmol/L (ref 98–111)
Creatinine: 1.39 mg/dL — ABNORMAL HIGH (ref 0.61–1.24)
GFR, Estimated: 53 mL/min — ABNORMAL LOW (ref >60)
Glucose, Bld: 114 mg/dL — ABNORMAL HIGH (ref 70–99)
Potassium: 4.5 mmol/L (ref 3.5–5.1)
Sodium: 142 mmol/L (ref 135–145)
Total Bilirubin: 0.2 mg/dL — ABNORMAL LOW (ref 0.3–1.2)
Total Protein: 7.3 g/dL (ref 6.5–8.1)

## 2020-11-29 LAB — CBC WITH DIFFERENTIAL (CANCER CENTER ONLY)
Abs Immature Granulocytes: 0.02 10*3/uL (ref 0.00–0.07)
Basophils Absolute: 0 10*3/uL (ref 0.0–0.1)
Basophils Relative: 1 %
Eosinophils Absolute: 0.2 10*3/uL (ref 0.0–0.5)
Eosinophils Relative: 3 %
HCT: 32.2 % — ABNORMAL LOW (ref 39.0–52.0)
Hemoglobin: 10.8 g/dL — ABNORMAL LOW (ref 13.0–17.0)
Immature Granulocytes: 0 %
Lymphocytes Relative: 17 %
Lymphs Abs: 1 10*3/uL (ref 0.7–4.0)
MCH: 33 pg (ref 26.0–34.0)
MCHC: 33.5 g/dL (ref 30.0–36.0)
MCV: 98.5 fL (ref 80.0–100.0)
Monocytes Absolute: 0.8 10*3/uL (ref 0.1–1.0)
Monocytes Relative: 13 %
Neutro Abs: 4.2 10*3/uL (ref 1.7–7.7)
Neutrophils Relative %: 66 %
Platelet Count: 304 10*3/uL (ref 150–400)
RBC: 3.27 MIL/uL — ABNORMAL LOW (ref 4.22–5.81)
RDW: 13.6 % (ref 11.5–15.5)
WBC Count: 6.2 10*3/uL (ref 4.0–10.5)
nRBC: 0 % (ref 0.0–0.2)

## 2020-11-29 LAB — TSH: TSH: 1.064 u[IU]/mL (ref 0.320–4.118)

## 2020-11-29 MED ORDER — SODIUM CHLORIDE 0.9 % IV SOLN
200.0000 mg | Freq: Once | INTRAVENOUS | Status: AC
  Administered 2020-11-29: 200 mg via INTRAVENOUS
  Filled 2020-11-29: qty 8

## 2020-11-29 MED ORDER — SODIUM CHLORIDE 0.9 % IV SOLN
Freq: Once | INTRAVENOUS | Status: AC
Start: 2020-11-29 — End: 2020-11-29
  Filled 2020-11-29: qty 250
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
## 2020-11-29 NOTE — Patient Instructions (Signed)
Pembrolizumab injection What is this medication? PEMBROLIZUMAB (pem broe liz ue mab) is a monoclonal antibody. It is used totreat certain types of cancer. This medicine may be used for other purposes; ask your health care provider orpharmacist if you have questions. COMMON BRAND NAME(S): Keytruda What should I tell my care team before I take this medication? They need to know if you have any of these conditions: autoimmune diseases like Crohn's disease, ulcerative colitis, or lupus have had or planning to have an allogeneic stem cell transplant (uses someone else's stem cells) history of organ transplant history of chest radiation nervous system problems like myasthenia gravis or Guillain-Barre syndrome an unusual or allergic reaction to pembrolizumab, other medicines, foods, dyes, or preservatives pregnant or trying to get pregnant breast-feeding How should I use this medication? This medicine is for infusion into a vein. It is given by a health careprofessional in a hospital or clinic setting. A special MedGuide will be given to you before each treatment. Be sure to readthis information carefully each time. Talk to your pediatrician regarding the use of this medicine in children. While this drug may be prescribed for children as young as 6 months for selectedconditions, precautions do apply. Overdosage: If you think you have taken too much of this medicine contact apoison control center or emergency room at once. NOTE: This medicine is only for you. Do not share this medicine with others. What if I miss a dose? It is important not to miss your dose. Call your doctor or health careprofessional if you are unable to keep an appointment. What may interact with this medication? Interactions have not been studied. This list may not describe all possible interactions. Give your health care provider a list of all the medicines, herbs, non-prescription drugs, or dietary supplements you use. Also  tell them if you smoke, drink alcohol, or use illegaldrugs. Some items may interact with your medicine. What should I watch for while using this medication? Your condition will be monitored carefully while you are receiving thismedicine. You may need blood work done while you are taking this medicine. Do not become pregnant while taking this medicine or for 4 months after stopping it. Women should inform their doctor if they wish to become pregnant or think they might be pregnant. There is a potential for serious side effects to an unborn child. Talk to your health care professional or pharmacist for more information. Do not breast-feed an infant while taking this medicine orfor 4 months after the last dose. What side effects may I notice from receiving this medication? Side effects that you should report to your doctor or health care professionalas soon as possible: allergic reactions like skin rash, itching or hives, swelling of the face, lips, or tongue bloody or black, tarry breathing problems changes in vision chest pain chills confusion constipation cough diarrhea dizziness or feeling faint or lightheaded fast or irregular heartbeat fever flushing joint pain low blood counts - this medicine may decrease the number of white blood cells, red blood cells and platelets. You may be at increased risk for infections and bleeding. muscle pain muscle weakness pain, tingling, numbness in the hands or feet persistent headache redness, blistering, peeling or loosening of the skin, including inside the mouth signs and symptoms of high blood sugar such as dizziness; dry mouth; dry skin; fruity breath; nausea; stomach pain; increased hunger or thirst; increased urination signs and symptoms of kidney injury like trouble passing urine or change in the amount of urine signs and  symptoms of liver injury like dark urine, light-colored stools, loss of appetite, nausea, right upper belly pain,  yellowing of the eyes or skin sweating swollen lymph nodes weight loss Side effects that usually do not require medical attention (report to yourdoctor or health care professional if they continue or are bothersome): decreased appetite hair loss tiredness This list may not describe all possible side effects. Call your doctor for medical advice about side effects. You may report side effects to FDA at1-800-FDA-1088. Where should I keep my medication? This drug is given in a hospital or clinic and will not be stored at home. NOTE: This sheet is a summary. It may not cover all possible information. If you have questions about this medicine, talk to your doctor, pharmacist, orhealth care provider.  2022 Elsevier/Gold Standard (2019-04-06 21:44:53)

## 2020-11-29 NOTE — Addendum Note (Signed)
Addended by: Ardeen Garland on: 11/29/2020 11:17 AM   Modules accepted: Orders

## 2020-12-17 DIAGNOSIS — M19012 Primary osteoarthritis, left shoulder: Secondary | ICD-10-CM | POA: Diagnosis not present

## 2020-12-17 DIAGNOSIS — M25512 Pain in left shoulder: Secondary | ICD-10-CM | POA: Diagnosis not present

## 2020-12-17 NOTE — Progress Notes (Signed)
[facility] OFFICE PROGRESS NOTE  [name], MD [address] [place] [address]  DIAGNOSIS: Stage IV (T1b, N3, M1c) non-small cell lung cancer, adenocarcinoma. 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   Biomarker Findings Tumor Mutational Burden - 10 Muts/Mb Microsatellite status - MS-Stable Genomic Findings For a complete list of the genes assayed, please refer to the Appendix. ERBB2 amplification - equivocal? GGE36 O29* FANCC splice site 476+5Y>Y SMAD4 Q289* TP53 E298* 7 Disease relevant genes with no reportable alterations: ALK, BRAF, EGFR, KRAS, MET, RET, ROS1   PDL1 expression is 30%.  PRIOR THERAPY: 1) Weekly concurrent chemoradiation with carboplatin for an AUC of 2 and paclitaxel 45 mg/m2 to the locally advanced disease in the chest. He started the first dose 01/31/2019.  Status post 7 cycles.  Last dose of chemotherapy was given on March 14, 2019. 2) palliative radiotherapy to the metastatic disease in the right adrenal gland. 3) Palliative radiotherapy to the enlarging muscular metastasis in the right hip  within the right adductor muscle, medial to the lesser trochanter of the femur under the care of Dr. [name]. First day of radiation 01/30/20  CURRENT THERAPY: Systemic chemotherapy with carboplatin for AUC of 5, Alimta 500 mg/M2 and Keytruda 200 mg IV every 3 weeks.  First dose April 20, 2019. Status post 29 cycles.  Starting from cycle #5, the patient began maintenance Alimta and Keytruda. Alimta was discontinued starting from cycle #19 due to renal insufficiency.   INTERVAL HISTORY: Trevor Bailey 75 y.o. male returns to the clinic for a follow up visit.  The patient is feeling fairly well today without any concerning complaints except for shoulder pain. He is planning on  seeing orthopedics next month to see if he is a candidate for surgery. The patient is tolerating his treatment well. He is currently on single agent immunotherapy with Keytruda. His alimta was discontinued due to renal insufficiency. Today, the patient denies any fever, chills, or night sweats. He continues to have a poor appetite. He takes remeron 30 mg p.o. nightly. He drinks ensue 2 x per day or so. He lost about 2 lbs. He denies any chest pain, shortness of breath, or hemoptysis.  He denies significant cough except for his usual "hacky" cough "now and then".  He denies changes with his cough. He denies any nausea, vomiting, diarrhea, or constipation.  He denies any headache or visual changes.  He denies any rashes or skin changes. The patient is here today for evaluation and repeat blood work prior to starting cycle #30.    MEDICAL HISTORY: Past Medical History:  Diagnosis Date   Adenopathy    left supraclavicular lymph node   Allergies    COPD (chronic obstructive pulmonary disease) ([place])    Coronary disease    Diabetes ([place])    Enlarged prostate    GERD (gastroesophageal reflux disease)    Glaucoma    Hyperlipidemia    Hypertension    Myocardial infarction ([place])    nscl ca dx'd 12/2018   Sleep apnea    wears CPAP   Wears partial dentures    upper and lower    ALLERGIES:  has No Known Allergies.  MEDICATIONS:  Current Outpatient Medications  Medication Sig Dispense Refill   ACCU-CHEK GUIDE test strip      albuterol (VENTOLIN  HFA) 108 (90 Base) MCG/ACT inhaler      amLODipine (NORVASC) 5 MG tablet TAKE 1 TABLET BY MOUTH  DAILY 90 tablet 0   aspirin EC 81 MG tablet Take 81 mg by mouth daily.     atorvastatin (LIPITOR) 80 MG tablet Take 80 mg by mouth daily.     budesonide-formoterol (SYMBICORT) 160-4.5 MCG/ACT inhaler Inhale 2 puffs into the lungs 2 (two) times daily.     clopidogrel (PLAVIX) 75 MG tablet Take 1 tablet (75 mg total) by mouth every evening. 30 tablet 0    ezetimibe (ZETIA) 10 MG tablet Take 10 mg by mouth daily.     finasteride (PROSCAR) 5 MG tablet Take 5 mg by mouth daily.      folic acid (FOLVITE) 1 MG tablet TAKE 1 TABLET BY MOUTH  DAILY 90 tablet 3   hydrochlorothiazide (HYDRODIURIL) 25 MG tablet Take 25 mg by mouth daily.      HYDROcodone-acetaminophen (NORCO/VICODIN) 5-325 MG tablet Take 1 tablet by mouth daily as needed.     latanoprost (XALATAN) 0.005 % ophthalmic solution Place 1 drop into both eyes at bedtime.      metFORMIN (GLUCOPHAGE) 1000 MG tablet Take 1,000 mg by mouth 2 (two) times daily.      metoprolol tartrate (LOPRESSOR) 25 MG tablet Take 25 mg 2 (two) times daily by mouth.     mirtazapine (REMERON) 30 MG tablet TAKE 1 TABLET BY MOUTH AT  BEDTIME 90 tablet 3   Multiple Vitamin (MULTIVITAMIN WITH MINERALS) TABS tablet Take 1 tablet by mouth daily. Centrum Silver     nitroGLYCERIN (NITROSTAT) 0.4 MG SL tablet Place 1 tablet (0.4 mg total) under the tongue every 5 (five) minutes as needed for chest pain. 25 tablet 6   ofloxacin (FLOXIN) 0.3 % OTIC solution      OVER THE COUNTER MEDICATION Take 1 tablet by mouth daily as needed. Colace     pantoprazole (PROTONIX) 40 MG tablet Take 40 mg daily by mouth.     Pharmacist Choice Lancets MISC      Polyethyl Glycol-Propyl Glycol (SYSTANE) 0.4-0.3 % SOLN Place 1-2 drops into both eyes 3 (three) times daily as needed (dry/irritated eyes.).     prochlorperazine (COMPAZINE) 10 MG tablet Take 10 mg by mouth daily as needed for nausea or vomiting.     ramipril (ALTACE) 10 MG capsule Take 10 mg by mouth 2 (two) times daily.      Respiratory Therapy Supplies (CARETOUCH 2 CPAP HOSE HANGER) MISC See admin instructions.     traMADol (ULTRAM) 50 MG tablet Take 50 mg by mouth daily.     No current facility-administered medications for this visit.    SURGICAL HISTORY:  Past Surgical History:  Procedure Laterality Date   ABDOMINAL AORTAGRAM Left 09/16/2013   Procedure: ABDOMINAL AORTAGRAM;   Surgeon: Sherren Kerns, MD;  Location: [facility] CATH LAB;  Service: Cardiovascular;  Laterality: Left;   CARDIAC CATHETERIZATION  01/2011   When there was segmental stenosis of the distal RCA, patent PCA stent, patent circumflex stent, and a patent small diagonal with 90% ISR.    COLONOSCOPY W/ BIOPSIES AND POLYPECTOMY     CORONARY ANGIOPLASTY  may 2002   Non-DES stenting of his circumflex, non-DES stenting of the RCA   HYDROCELE EXCISION / REPAIR  2009   by Dr Eliseo Squires HERNIA REPAIR Bilateral    Dr Lurene Shadow   LYMPH NODE BIOPSY Left 01/10/2019   Procedure: left supraclavicular LYMPH NODE BIOPSY;  Surgeon: [name], MD;  Location: [facility];  Service: Thoracic;  Laterality: Left;   MULTIPLE TOOTH EXTRACTIONS     NM [place]  01/08/2011   moderate in size and intensity area of reversible ischemia in the basal to mid inferior and septal territories. Abnormal study   stents  2008   proximal RCA, DES for progession of disease.   US ECHOCARDIOGRAPHY  01/12/2012   mild concentric LVH, borderline LA enlargement, mild to mod TR    REVIEW OF SYSTEMS:   Review of Systems  Constitutional: Negative for appetite change, chills, fatigue, fever and unexpected weight change. HENT: Negative for mouth sores, nosebleeds, sore throat and trouble swallowing.   Eyes: Negative for eye problems and icterus. Respiratory: Positive for mild baseline cough. Negative for hemoptysis, shortness of breath and wheezing.   Cardiovascular: Negative for chest pain and leg swelling. Gastrointestinal: Negative for abdominal pain, constipation, diarrhea, nausea and vomiting. Genitourinary: Negative for bladder incontinence, difficulty urinating, dysuria, frequency and hematuria.   Musculoskeletal: Negative for back pain, gait problem, neck pain and neck stiffness. Skin: Positive for prior radiation burn in left shoulder. Negative for itching and rash. Neurological: Negative for dizziness, extremity  weakness, gait problem, headaches, light-headedness and seizures. Hematological: Negative for adenopathy. Does not bruise/bleed easily. Psychiatric/Behavioral: Negative for confusion, depression and sleep disturbance. The patient is not nervous/anxious.     PHYSICAL EXAMINATION:  Blood pressure 120/79, pulse 76, temperature 98.3 F (36.8 C), temperature source Oral, resp. rate 18, height $RemoveBe'5\' 9"'OAXRIlKhX$  (1.753 m), weight 170 lb 1.6 oz (77.2 kg), SpO2 98 %.  ECOG PERFORMANCE STATUS: 1  Physical Exam  Constitutional: Oriented to person, place, and time and well-developed, well-nourished, and in no distress. HENT: Head: Normocephalic and atraumatic. Mouth/Throat: Oropharynx is clear and moist. No oropharyngeal exudate. Eyes: Conjunctivae are normal. Right eye exhibits no discharge. Left eye exhibits no discharge. No scleral icterus. Neck: Normal range of motion. Neck supple. Cardiovascular: Normal rate, regular rhythm, normal heart sounds and intact distal pulses.   Pulmonary/Chest: Effort normal and breath sounds normal. No respiratory distress. No wheezes. No rales. Abdominal: Soft. Bowel sounds are normal. Exhibits no distension and no mass. There is no tenderness.  Musculoskeletal: Normal range of motion. Exhibits no edema.  Lymphadenopathy:    No cervical adenopathy.  Neurological: Alert and oriented to person, place, and time. Exhibits normal muscle tone. Gait normal. Coordination normal. Skin: Positive for healed radiation burn. Skin is warm and dry. No rash noted. Not diaphoretic. No erythema. No pallor.  Psychiatric: Mood, memory and judgment normal. Vitals reviewed.  LABORATORY DATA: Lab Results  Component Value Date   WBC 5.9 12/20/2020   HGB 10.3 (L) 12/20/2020   HCT 31.3 (L) 12/20/2020   MCV 97.5 12/20/2020   PLT 291 12/20/2020      Chemistry      Component Value Date/Time   NA 139 12/20/2020 1040   K 5.3 (H) 12/20/2020 1040   CL 106 12/20/2020 1040   CO2 21 (L)  12/20/2020 1040   BUN 38 (H) 12/20/2020 1040   CREATININE 1.40 (H) 12/20/2020 1040   CREATININE 0.94 05/16/2014 1109      Component Value Date/Time   CALCIUM 10.5 (H) 12/20/2020 1040   ALKPHOS 105 12/20/2020 1040   AST 21 12/20/2020 1040   ALT 21 12/20/2020 1040   BILITOT 0.2 (L) 12/20/2020 1040       RADIOGRAPHIC STUDIES:  No results found.   ASSESSMENT/PLAN:  This is a very  pleasant 75 year old African-American male with stage IV non-small cell lung cancer, adenocarcinoma with no actionable mutation and PD-L1 expression of 30%, presented with locally advanced disease in the chest as well as solitary metastasis to the right adrenal gland. He has no actionable mutations.    The patient completed a course of concurrent chemoradiation to the chest with weekly carboplatin for AUC of 2 and paclitaxel 45 mg/M2 status post 7 cycles.  Last dose of chemotherapy was given on March 14, 2019. He also completed palliative radiotherapy to the right adrenal gland lesion under the care of Dr. [name].   He also completed palliative radiotherapy to the muscular metastasis in the right hip in September 2021.   He is currently undergoing systemic chemotherapy with carboplatin for an AUC of 5, Alimta 400 mg per metered squared and Keytruda 200 mg IV every 3 weeks.  He is status post 29 cycles.  He tolerated treatment well without any concerning adverse side effects except for mild fatigue. Starting from cycle #5, he has been on maintenance treatment with Alimta and Keytruda. Alimta has been discontinued due to renal insufficiency. He is currently on single agent immunotherapy with [place].    Labs were reviewed. Recommend that he proceed with cycle #30 today as scheduled.   Labs were reviewed. He is alright to treat today with his labs, specifically with his creatinine of 1.40 The patient will proceed with cycle #31 today single agent immunotherapy is Beryle Flock today as scheduled.  His potassium is a  little high. I gave him a list of potassium rich foods and instructed him to avoid these foods rich in potassium.   We will see him back for follow-up visit in 3 weeks for evaluation before starting cycle #29.  The patient was advised to call immediately if he has any concerning symptoms in the interval. The patient voices understanding of current disease status and treatment options and is in agreement with the current care plan. All questions were answered. The patient knows to call the clinic with any problems, questions or concerns. We can certainly see the patient much sooner if necessary          Orders Placed This Encounter  Procedures   TSH    Standing Status:   Standing    Number of Occurrences:   5    Standing Expiration Date:   12/20/2021     The total time spent in the appointment was 20-29 minutes.   Cayla Wiegand L Handy Mcloud, PA-C 12/20/20

## 2020-12-20 ENCOUNTER — Inpatient Hospital Stay: Payer: Medicare Other

## 2020-12-20 ENCOUNTER — Other Ambulatory Visit: Payer: Self-pay

## 2020-12-20 ENCOUNTER — Inpatient Hospital Stay: Payer: Medicare Other | Attending: Physician Assistant

## 2020-12-20 ENCOUNTER — Inpatient Hospital Stay: Payer: Medicare Other | Admitting: Physician Assistant

## 2020-12-20 ENCOUNTER — Encounter: Payer: Self-pay | Admitting: Physician Assistant

## 2020-12-20 VITALS — BP 120/79 | HR 76 | Temp 98.3°F | Resp 18 | Ht 69.0 in | Wt 170.1 lb

## 2020-12-20 DIAGNOSIS — Z5112 Encounter for antineoplastic immunotherapy: Secondary | ICD-10-CM | POA: Insufficient documentation

## 2020-12-20 DIAGNOSIS — C7971 Secondary malignant neoplasm of right adrenal gland: Secondary | ICD-10-CM | POA: Insufficient documentation

## 2020-12-20 DIAGNOSIS — C3412 Malignant neoplasm of upper lobe, left bronchus or lung: Secondary | ICD-10-CM | POA: Diagnosis not present

## 2020-12-20 DIAGNOSIS — R918 Other nonspecific abnormal finding of lung field: Secondary | ICD-10-CM

## 2020-12-20 DIAGNOSIS — Z79899 Other long term (current) drug therapy: Secondary | ICD-10-CM | POA: Insufficient documentation

## 2020-12-20 DIAGNOSIS — C770 Secondary and unspecified malignant neoplasm of lymph nodes of head, face and neck: Secondary | ICD-10-CM | POA: Diagnosis not present

## 2020-12-20 LAB — CMP (CANCER CENTER ONLY)
ALT: 21 U/L (ref 0–44)
AST: 21 U/L (ref 15–41)
Albumin: 2.9 g/dL — ABNORMAL LOW (ref 3.5–5.0)
Alkaline Phosphatase: 105 U/L (ref 38–126)
Anion gap: 12 (ref 5–15)
BUN: 38 mg/dL — ABNORMAL HIGH (ref 8–23)
CO2: 21 mmol/L — ABNORMAL LOW (ref 22–32)
Calcium: 10.5 mg/dL — ABNORMAL HIGH (ref 8.9–10.3)
Chloride: 106 mmol/L (ref 98–111)
Creatinine: 1.4 mg/dL — ABNORMAL HIGH (ref 0.61–1.24)
GFR, Estimated: 52 mL/min — ABNORMAL LOW (ref >60)
Glucose, Bld: 104 mg/dL — ABNORMAL HIGH (ref 70–99)
Potassium: 5.3 mmol/L — ABNORMAL HIGH (ref 3.5–5.1)
Sodium: 139 mmol/L (ref 135–145)
Total Bilirubin: 0.2 mg/dL — ABNORMAL LOW (ref 0.3–1.2)
Total Protein: 7.6 g/dL (ref 6.5–8.1)

## 2020-12-20 LAB — CBC WITH DIFFERENTIAL (CANCER CENTER ONLY)
Abs Immature Granulocytes: 0.04 10*3/uL (ref 0.00–0.07)
Basophils Absolute: 0.1 10*3/uL (ref 0.0–0.1)
Basophils Relative: 1 %
Eosinophils Absolute: 0.1 10*3/uL (ref 0.0–0.5)
Eosinophils Relative: 2 %
HCT: 31.3 % — ABNORMAL LOW (ref 39.0–52.0)
Hemoglobin: 10.3 g/dL — ABNORMAL LOW (ref 13.0–17.0)
Immature Granulocytes: 1 %
Lymphocytes Relative: 18 %
Lymphs Abs: 1.1 10*3/uL (ref 0.7–4.0)
MCH: 32.1 pg (ref 26.0–34.0)
MCHC: 32.9 g/dL (ref 30.0–36.0)
MCV: 97.5 fL (ref 80.0–100.0)
Monocytes Absolute: 0.7 10*3/uL (ref 0.1–1.0)
Monocytes Relative: 12 %
Neutro Abs: 3.9 10*3/uL (ref 1.7–7.7)
Neutrophils Relative %: 66 %
Platelet Count: 291 10*3/uL (ref 150–400)
RBC: 3.21 MIL/uL — ABNORMAL LOW (ref 4.22–5.81)
RDW: 13.4 % (ref 11.5–15.5)
WBC Count: 5.9 10*3/uL (ref 4.0–10.5)
nRBC: 0 % (ref 0.0–0.2)

## 2020-12-20 MED ORDER — SODIUM CHLORIDE 0.9 % IV SOLN
200.0000 mg | Freq: Once | INTRAVENOUS | Status: AC
  Administered 2020-12-20: 200 mg via INTRAVENOUS
  Filled 2020-12-20: qty 8

## 2020-12-20 MED ORDER — SODIUM CHLORIDE 0.9 % IV SOLN
Freq: Once | INTRAVENOUS | Status: AC
  Filled 2020-12-20: qty 250

## 2020-12-20 NOTE — Patient Instructions (Signed)
[facility]   Discharge Instructions: Thank you for choosing [facility] to provide your oncology and hematology care.   If you have a lab appointment with the [facility], please go directly to the [facility] and check in at the registration area.   Wear comfortable clothing and clothing appropriate for easy access to any Portacath or PICC line.   We strive to give you quality time with your provider. You may need to reschedule your appointment if you arrive late (15 or more minutes).  Arriving late affects you and other patients whose appointments are after yours.  Also, if you miss three or more appointments without notifying the office, you may be dismissed from the clinic at the provider's discretion.      For prescription refill requests, have your pharmacy contact our office and allow 72 hours for refills to be completed.    Today you received the following chemotherapy and/or immunotherapy agents: Pembrolizumab Beryle Flock)      To help prevent nausea and vomiting after your treatment, we encourage you to take your nausea medication as directed.  BELOW ARE SYMPTOMS THAT SHOULD BE REPORTED IMMEDIATELY: *FEVER GREATER THAN 100.4 F (38 C) OR HIGHER *CHILLS OR SWEATING *NAUSEA AND VOMITING THAT IS NOT CONTROLLED WITH YOUR NAUSEA MEDICATION *UNUSUAL SHORTNESS OF BREATH *UNUSUAL BRUISING OR BLEEDING *URINARY PROBLEMS (pain or burning when urinating, or frequent urination) *BOWEL PROBLEMS (unusual diarrhea, constipation, pain near the anus) TENDERNESS IN MOUTH AND THROAT WITH OR WITHOUT PRESENCE OF ULCERS (sore throat, sores in mouth, or a toothache) UNUSUAL RASH, SWELLING OR PAIN  UNUSUAL VAGINAL DISCHARGE OR ITCHING   Items with * indicate a potential emergency and should be followed up as soon as possible or go to the Emergency Department if any problems should occur.  Please show the CHEMOTHERAPY ALERT CARD or IMMUNOTHERAPY ALERT  CARD at check-in to the Emergency Department and triage nurse.  Should you have questions after your visit or need to cancel or reschedule your appointment, please contact [facility]  Dept: [phone]  and follow the prompts.  Office hours are 8:00 a.m. to 4:30 p.m. Monday - Friday. Please note that voicemails left after 4:00 p.m. may not be returned until the following business day.  We are closed weekends and major holidays. You have access to a nurse at all times for urgent questions. Please call the main number to the clinic Dept: [phone] and follow the prompts.   For any non-urgent questions, you may also contact your provider using MyChart. We now offer e-Visits for anyone 76 and older to request care online for non-urgent symptoms. For details visit [email or web address].   Also download the MyChart app! Go to the app store, search "MyChart", open the app, select [facility], and log in with your MyChart username and password.  Due to Covid, a mask is required upon entering the hospital/clinic. If you do not have a mask, one will be given to you upon arrival. For doctor visits, patients may have 1 support person aged 76 or older with them. For treatment visits, patients cannot have anyone with them due to current Covid guidelines and our immunocompromised population.

## 2020-12-24 DIAGNOSIS — G4733 Obstructive sleep apnea (adult) (pediatric): Secondary | ICD-10-CM | POA: Diagnosis not present

## 2020-12-31 ENCOUNTER — Encounter: Payer: Self-pay | Admitting: Physician Assistant

## 2020-12-31 ENCOUNTER — Other Ambulatory Visit: Payer: Self-pay

## 2020-12-31 ENCOUNTER — Ambulatory Visit (INDEPENDENT_AMBULATORY_CARE_PROVIDER_SITE_OTHER): Payer: Medicare Other | Admitting: Physician Assistant

## 2020-12-31 VITALS — BP 118/78 | HR 72 | Ht 69.5 in | Wt 166.8 lb

## 2020-12-31 DIAGNOSIS — E785 Hyperlipidemia, unspecified: Secondary | ICD-10-CM

## 2020-12-31 DIAGNOSIS — M25512 Pain in left shoulder: Secondary | ICD-10-CM | POA: Diagnosis not present

## 2020-12-31 DIAGNOSIS — I739 Peripheral vascular disease, unspecified: Secondary | ICD-10-CM | POA: Diagnosis not present

## 2020-12-31 DIAGNOSIS — C3490 Malignant neoplasm of unspecified part of unspecified bronchus or lung: Secondary | ICD-10-CM

## 2020-12-31 DIAGNOSIS — I4891 Unspecified atrial fibrillation: Secondary | ICD-10-CM

## 2020-12-31 DIAGNOSIS — E119 Type 2 diabetes mellitus without complications: Secondary | ICD-10-CM

## 2020-12-31 DIAGNOSIS — I2510 Atherosclerotic heart disease of native coronary artery without angina pectoris: Secondary | ICD-10-CM | POA: Diagnosis not present

## 2020-12-31 DIAGNOSIS — I10 Essential (primary) hypertension: Secondary | ICD-10-CM | POA: Diagnosis not present

## 2020-12-31 MED ORDER — APIXABAN 5 MG PO TABS: 5.0000 mg | ORAL_TABLET | Freq: Two times a day (BID) | ORAL | 0 refills | Status: DC

## 2020-12-31 NOTE — Patient Instructions (Addendum)
Medication Instructions:  STOP Plavix  START Eliquis 5 mg 2 times a day   *If you need a refill on your cardiac medications before your next appointment, please call your pharmacy*  Lab Work: NONE ordered at this time of appointment   If you have labs (blood work) drawn today and your tests are completely normal, you will receive your results only by: [facility] (if you have MyChart) OR A paper copy in the mail If you have any lab test that is abnormal or we need to change your treatment, we will call you to review the results.  Testing/Procedures: NONE ordered at this time of appointment   Follow-Up: At [facility], you and your health needs are our priority.  As part of our continuing mission to provide you with exceptional heart care, we have created designated Provider Care Teams.  These Care Teams include your primary Cardiologist (physician) and Advanced Practice Providers (APPs -  Physician Assistants and Nurse Practitioners) who all work together to provide you with the care you need, when you need it.   Your next appointment:   3 month(s)  The format for your next appointment:   In Person  Provider:   Shelva Majestic, MD  Other Instructions

## 2020-12-31 NOTE — Telephone Encounter (Signed)
This encounter was created in error - please disregard.

## 2020-12-31 NOTE — Progress Notes (Addendum)
Cardiology Office Note:    Date:  01/02/2021   ID:  Trevor Bailey, DOB 05-24-1945, MRN [id number]  PCP:  [name], MD   [facility] Providers Cardiologist:  Shelva Majestic, MD     Referring MD: [name], MD   Chief Complaint  Patient presents with   Follow-up    Seen for Dr. Claiborne Billings    History of Present Illness:    Trevor Bailey is a 75 y.o. male with a hx of HTN, HLD, DM II, OSA on CPAP, PAD, stage IV non-small cell lung cancer and CAD. Had PTCA and bare-metal stenting of LAD and left circumflex artery and proximal diagonal inmate 2000.  He underwent bare-metal stenting of left circumflex artery and mid RCA in 2002.  He had a DES stenting of proximal RCA in 2008.  Repeat cardiac catheterization in September 2012 showed segmental disease in distal RCA and a patent RCA stent, patent left circumflex stent, small diagonal vessel with 90% in-stent restenosis that was too small to fix, medical therapy was recommended.  He is being followed by Dr. Oneida Alar for peripheral arterial disease.  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  He has been treated with chemotherapy in 2020.  He has also received palliative radiotherapy as well in 2021.  He was last seen by Dr. Claiborne Billings in August 2021 at which time he was doing well.  Patient presents today for follow-up.  He denies any recent chest pain or worsening shortness of breath.  EKG however shows new atrial fibrillation with controlled heart rate.  He has no cardiac awareness of atrial fibrillation and the does not know when he went into A. fib.  Last EKG was from December 2019.  He denies any palpitation.  He has no significant shortness of breath, lower extremity edema, orthopnea or PND.  He has significant left shoulder  pain that is preventing him doing any activity.  I do not think he can achieve more than 4 METS of activity.  He says he is going to see his orthopedic doctor, they may decide to proceed with surgery in the future on the left shoulder.  I asked him to send [place] formal cardiac clearance request if his surgeon does decide to proceed with surgery.  Doing a nuclear stress test on him in the setting of history of CAD and stage IV lung cancer is unlikely to reduce his overall surgical risk.  Since he has no chest pain, I am more inclined to go ahead and cleared the patient.  This will need to be discussed with MD to make sure Dr. Claiborne Billings is okay with it.  Otherwise, I have recommended discontinue Plavix and switch to Eliquis 5 mg twice a day instead.  I will leave him on aspirin given the history of significant CAD.  He can follow-up with Dr. Claiborne Billings in 3 months.  He has been going to the cancer center and get a repeat CBC and CMP every 3 weeks, that we will have him monitor for any significant anemia.   Past Medical History:  Diagnosis Date   Adenopathy    left supraclavicular lymph node   Allergies    COPD (chronic obstructive pulmonary disease) ([place])    Coronary disease    Diabetes ([place])    Enlarged prostate  GERD (gastroesophageal reflux disease)    Glaucoma    Hyperlipidemia    Hypertension    Myocardial infarction ([place])    nscl ca dx'd 12/2018   Sleep apnea    wears CPAP   Wears partial dentures    upper and lower    Past Surgical History:  Procedure Laterality Date   ABDOMINAL AORTAGRAM Left 09/16/2013   Procedure: ABDOMINAL Maxcine Ham;  Surgeon: Elam Dutch, MD;  Location: [facility] CATH LAB;  Service: Cardiovascular;  Laterality: Left;   CARDIAC CATHETERIZATION  01/2011   When there was segmental stenosis of the distal RCA, patent PCA stent, patent circumflex stent, and a patent small diagonal with 90% ISR.    COLONOSCOPY W/ BIOPSIES AND POLYPECTOMY     CORONARY ANGIOPLASTY  may 2002    Non-DES stenting of his circumflex, non-DES stenting of the RCA   HYDROCELE EXCISION / REPAIR  2009   by Dr Jackquline Bosch HERNIA REPAIR Bilateral    Dr Bubba Camp   LYMPH NODE BIOPSY Left 01/10/2019   Procedure: left supraclavicular LYMPH NODE BIOPSY;  Surgeon: [name], MD;  Location: [place];  Service: Thoracic;  Laterality: Left;   MULTIPLE TOOTH EXTRACTIONS     NM [place]  01/08/2011   moderate in size and intensity area of reversible ischemia in the basal to mid inferior and septal territories. Abnormal study   stents  2008   proximal RCA, DES for progession of disease.   US ECHOCARDIOGRAPHY  01/12/2012   mild concentric LVH, borderline LA enlargement, mild to mod TR    Current Medications: Current Meds  Medication Sig   ACCU-CHEK GUIDE test strip    albuterol (VENTOLIN HFA) 108 (90 Base) MCG/ACT inhaler    amLODipine (NORVASC) 5 MG tablet TAKE 1 TABLET BY MOUTH  DAILY   apixaban (ELIQUIS) 5 MG TABS tablet Take 1 tablet (5 mg total) by mouth 2 (two) times daily.   aspirin EC 81 MG tablet Take 81 mg by mouth daily.   atorvastatin (LIPITOR) 80 MG tablet Take 80 mg by mouth daily.   budesonide-formoterol (SYMBICORT) 160-4.5 MCG/ACT inhaler Inhale 2 puffs into the lungs 2 (two) times daily.   ezetimibe (ZETIA) 10 MG tablet Take 10 mg by mouth daily.   finasteride (PROSCAR) 5 MG tablet Take 5 mg by mouth daily.    folic acid (FOLVITE) 1 MG tablet TAKE 1 TABLET BY MOUTH  DAILY   hydrochlorothiazide (HYDRODIURIL) 25 MG tablet Take 25 mg by mouth daily.    HYDROcodone-acetaminophen (NORCO/VICODIN) 5-325 MG tablet Take 1 tablet by mouth daily as needed.   latanoprost (XALATAN) 0.005 % ophthalmic solution Place 1 drop into both eyes at bedtime.    metFORMIN (GLUCOPHAGE) 1000 MG tablet Take 1,000 mg by mouth 2 (two) times daily.    metoprolol tartrate (LOPRESSOR) 25 MG tablet Take 25 mg 2 (two) times daily by mouth.   mirtazapine (REMERON) 30 MG tablet TAKE 1 TABLET  BY MOUTH AT  BEDTIME   Multiple Vitamin (MULTIVITAMIN WITH MINERALS) TABS tablet Take 1 tablet by mouth daily. Centrum Silver   nitroGLYCERIN (NITROSTAT) 0.4 MG SL tablet Place 1 tablet (0.4 mg total) under the tongue every 5 (five) minutes as needed for chest pain.   ofloxacin (FLOXIN) 0.3 % OTIC solution    OVER THE COUNTER MEDICATION Take 1 tablet by mouth daily as needed. Colace   pantoprazole (PROTONIX) 40 MG tablet Take 40 mg daily by mouth.   Pharmacist Choice  Lancets MISC    Polyethyl Glycol-Propyl Glycol (SYSTANE) 0.4-0.3 % SOLN Place 1-2 drops into both eyes 3 (three) times daily as needed (dry/irritated eyes.).   prochlorperazine (COMPAZINE) 10 MG tablet Take 10 mg by mouth daily as needed for nausea or vomiting.   ramipril (ALTACE) 10 MG capsule Take 10 mg by mouth 2 (two) times daily.    Respiratory Therapy Supplies (CARETOUCH 2 CPAP HOSE HANGER) MISC See admin instructions.   traMADol (ULTRAM) 50 MG tablet Take 50 mg by mouth daily.   [DISCONTINUED] clopidogrel (PLAVIX) 75 MG tablet Take 1 tablet (75 mg total) by mouth every evening.     Allergies:   Patient has no known allergies.   Social History   Socioeconomic History   Marital status: Married    Spouse name: Not on file   Number of children: Not on file   Years of education: Not on file   Highest education level: Not on file  Occupational History   Not on file  Tobacco Use   Smoking status: Former    Packs/day: 0.10    Years: 50.00    Pack years: 5.00    Types: Cigarettes   Smokeless tobacco: Never   Tobacco comments:    quit smoking cigarettes July 2020  Vaping Use   Vaping Use: Never used  Substance and Sexual Activity   Alcohol use: Yes    Alcohol/week: 0.0 standard drinks    Comment: moderately   Drug use: No   Sexual activity: Not on file  Other Topics Concern   Not on file  Social History Narrative   Not on file   Social Determinants of Health   Financial Resource Strain: Not on file  Food  Insecurity: Not on file  Transportation Needs: Not on file  Physical Activity: Not on file  Stress: Not on file  Social Connections: Not on file     Family History: The patient's family history includes Breast cancer in his sister; Diabetes in his father; Heart attack in his mother; Heart disease in his mother; Hyperlipidemia in his sister; Hypertension in his father and sister; Stroke in his father.  ROS:   Please see the history of present illness.     All other systems reviewed and are negative.  EKGs/Labs/Other Studies Reviewed:    The following studies were reviewed today:  N/A  EKG:  EKG is ordered today.  The ekg ordered today demonstrates EKG strip demonstrated atrial fibrillation, rate controlled  Recent Labs: 11/29/2020: TSH 1.064 12/20/2020: ALT 21; BUN 38; Creatinine 1.40; Hemoglobin 10.3; Platelet Count 291; Potassium 5.3; Sodium 139  Recent Lipid Panel    Component Value Date/Time   CHOL 139 05/16/2014 1115   TRIG 111 05/16/2014 1115   HDL 49 05/16/2014 1115   CHOLHDL 2.8 05/16/2014 1115   VLDL 22 05/16/2014 1115   LDLCALC 68 05/16/2014 1115     Risk Assessment/Calculations:    CHA2DS2-VASc Score = 5  This indicates a 7.2% annual risk of stroke. The patient's score is based upon: CHF History: No HTN History: Yes Diabetes History: Yes Stroke History: No Vascular Disease History: Yes Age Score: 2 Gender Score: 0         Physical Exam:    VS:  BP 118/78   Pulse 72   Ht 5' 9.5" (1.765 m)   Wt 166 lb 12.8 oz (75.7 kg)   SpO2 98%   BMI 24.28 kg/m     Wt Readings from Last 3 Encounters:  12/31/20  166 lb 12.8 oz (75.7 kg)  12/20/20 170 lb 1.6 oz (77.2 kg)  11/29/20 172 lb 6.4 oz (78.2 kg)     GEN:  Well nourished, well developed in no acute distress HEENT: Normal NECK: No JVD; No carotid bruits LYMPHATICS: No lymphadenopathy CARDIAC: Irregularly irregular, no murmurs, rubs, gallops RESPIRATORY:  Clear to auscultation without rales, wheezing  or rhonchi  ABDOMEN: Soft, non-tender, non-distended MUSCULOSKELETAL:  No edema; No deformity  SKIN: Warm and dry NEUROLOGIC:  Alert and oriented x 3 PSYCHIATRIC:  Normal affect   ASSESSMENT:    1. Atrial fibrillation, unspecified type ([place])   2. Coronary artery disease involving native coronary artery of native heart without angina pectoris   3. Essential hypertension   4. Hyperlipidemia LDL goal <70   5. Controlled type 2 diabetes mellitus without complication, without long-term current use of insulin ([place])   6. Metastatic non-small cell lung cancer ([place])   7. PAD (peripheral artery disease) ([place])   8. Left shoulder pain, unspecified chronicity    PLAN:    In order of problems listed above:  Atrial fibrillation: Unclear onset of occurrence.  Last EKG obtained in December 2019 showed a sinus rhythm.  Patient has no cardiac awareness of atrial fibrillation.  Heart rate is fairly controlled.  I recommended stop Plavix and switch to Eliquis instead.  Given lack of symptom and exact onset, rate control strategy is recommended.  CAD: Denies any recent chest pain.  Discontinue Plavix given the need for Eliquis.  Continue on 81 mg aspirin given history of significant CAD  Hypertension: Blood pressure stable on current therapy  Hyperlipidemia: On Lipitor.  DM2: Managed by primary care provider  Metastatic non-small cell lung cancer: Followed by hematology oncology service  PAD: Denies any claudication symptoms.  Previously followed by Dr. Oneida Alar.  Left shoulder pain: Appears to be new.  Patient is going to see her orthopedic physician.  If surgery is considered, we will need a formal cardiac clearance request.  Given his history of metastatic non-small cell lung cancer, I think the benefit of nuclear stress test prior to a orthopedic surgery would be low and unlikely to lower his overall risk.  This of course will need to be reviewed by Dr. Claiborne Billings if surgery is considered.  Also the  patient is aware that Eliquis will need to be held for a few days prior to any surgery.       Medication Adjustments/Labs and Tests Ordered: Current medicines are reviewed at length with the patient today.  Concerns regarding medicines are outlined above.  Orders Placed This Encounter  Procedures   EKG 12-Lead   Rhythm strip   Meds ordered this encounter  Medications   apixaban (ELIQUIS) 5 MG TABS tablet    Sig: Take 1 tablet (5 mg total) by mouth 2 (two) times daily.    Dispense:  60 tablet    Refill:  0    Patient Instructions  Medication Instructions:  STOP Plavix  START Eliquis 5 mg 2 times a day   *If you need a refill on your cardiac medications before your next appointment, please call your pharmacy*  Lab Work: NONE ordered at this time of appointment   If you have labs (blood work) drawn today and your tests are completely normal, you will receive your results only by: [place] (if you have MyChart) OR A paper copy in the mail If you have any lab test that is abnormal or we need to change your  treatment, we will call you to review the results.  Testing/Procedures: NONE ordered at this time of appointment   Follow-Up: At [facility], you and your health needs are our priority.  As part of our continuing mission to provide you with exceptional heart care, we have created designated Provider Care Teams.  These Care Teams include your primary Cardiologist (physician) and Advanced Practice Providers (APPs -  Physician Assistants and Nurse Practitioners) who all work together to provide you with the care you need, when you need it.   Your next appointment:   3 month(s)  The format for your next appointment:   In Person  Provider:   Shelva Majestic, MD  Other Instructions    Signed, Almyra Deforest, [place]  01/02/2021 9:45 PM    [place]

## 2021-01-02 ENCOUNTER — Encounter: Payer: Self-pay | Admitting: Physician Assistant

## 2021-01-09 DIAGNOSIS — H409 Unspecified glaucoma: Secondary | ICD-10-CM | POA: Diagnosis not present

## 2021-01-09 DIAGNOSIS — I6529 Occlusion and stenosis of unspecified carotid artery: Secondary | ICD-10-CM | POA: Diagnosis not present

## 2021-01-09 DIAGNOSIS — N1831 Chronic kidney disease, stage 3a: Secondary | ICD-10-CM | POA: Diagnosis not present

## 2021-01-09 DIAGNOSIS — M25512 Pain in left shoulder: Secondary | ICD-10-CM | POA: Diagnosis not present

## 2021-01-09 DIAGNOSIS — E7800 Pure hypercholesterolemia, unspecified: Secondary | ICD-10-CM | POA: Diagnosis not present

## 2021-01-09 DIAGNOSIS — E1159 Type 2 diabetes mellitus with other circulatory complications: Secondary | ICD-10-CM | POA: Diagnosis not present

## 2021-01-09 DIAGNOSIS — I739 Peripheral vascular disease, unspecified: Secondary | ICD-10-CM | POA: Diagnosis not present

## 2021-01-09 DIAGNOSIS — Z7984 Long term (current) use of oral hypoglycemic drugs: Secondary | ICD-10-CM | POA: Diagnosis not present

## 2021-01-09 DIAGNOSIS — E1165 Type 2 diabetes mellitus with hyperglycemia: Secondary | ICD-10-CM | POA: Diagnosis not present

## 2021-01-09 DIAGNOSIS — C3492 Malignant neoplasm of unspecified part of left bronchus or lung: Secondary | ICD-10-CM | POA: Diagnosis not present

## 2021-01-10 ENCOUNTER — Other Ambulatory Visit: Payer: Self-pay

## 2021-01-10 ENCOUNTER — Encounter: Payer: Self-pay | Admitting: Internal Medicine

## 2021-01-10 ENCOUNTER — Inpatient Hospital Stay: Payer: Medicare Other

## 2021-01-10 ENCOUNTER — Inpatient Hospital Stay (HOSPITAL_BASED_OUTPATIENT_CLINIC_OR_DEPARTMENT_OTHER): Payer: Medicare Other | Admitting: Internal Medicine

## 2021-01-10 VITALS — BP 102/68 | HR 86 | Temp 97.5°F | Resp 19 | Ht 69.5 in | Wt 167.6 lb

## 2021-01-10 DIAGNOSIS — Z5112 Encounter for antineoplastic immunotherapy: Secondary | ICD-10-CM | POA: Diagnosis not present

## 2021-01-10 DIAGNOSIS — C770 Secondary and unspecified malignant neoplasm of lymph nodes of head, face and neck: Secondary | ICD-10-CM | POA: Diagnosis not present

## 2021-01-10 DIAGNOSIS — C3490 Malignant neoplasm of unspecified part of unspecified bronchus or lung: Secondary | ICD-10-CM

## 2021-01-10 DIAGNOSIS — C3412 Malignant neoplasm of upper lobe, left bronchus or lung: Secondary | ICD-10-CM

## 2021-01-10 DIAGNOSIS — R918 Other nonspecific abnormal finding of lung field: Secondary | ICD-10-CM

## 2021-01-10 DIAGNOSIS — Z79899 Other long term (current) drug therapy: Secondary | ICD-10-CM | POA: Diagnosis not present

## 2021-01-10 DIAGNOSIS — C7971 Secondary malignant neoplasm of right adrenal gland: Secondary | ICD-10-CM | POA: Diagnosis not present

## 2021-01-10 LAB — CMP (CANCER CENTER ONLY)
ALT: 19 U/L (ref 0–44)
AST: 13 U/L — ABNORMAL LOW (ref 15–41)
Albumin: 2.8 g/dL — ABNORMAL LOW (ref 3.5–5.0)
Alkaline Phosphatase: 108 U/L (ref 38–126)
Anion gap: 9 (ref 5–15)
BUN: 28 mg/dL — ABNORMAL HIGH (ref 8–23)
CO2: 22 mmol/L (ref 22–32)
Calcium: 9.3 mg/dL (ref 8.9–10.3)
Chloride: 108 mmol/L (ref 98–111)
Creatinine: 1.29 mg/dL — ABNORMAL HIGH (ref 0.61–1.24)
GFR, Estimated: 58 mL/min — ABNORMAL LOW (ref >60)
Glucose, Bld: 111 mg/dL — ABNORMAL HIGH (ref 70–99)
Potassium: 4.6 mmol/L (ref 3.5–5.1)
Sodium: 139 mmol/L (ref 135–145)
Total Bilirubin: 0.3 mg/dL (ref 0.3–1.2)
Total Protein: 6.3 g/dL — ABNORMAL LOW (ref 6.5–8.1)

## 2021-01-10 LAB — CBC WITH DIFFERENTIAL (CANCER CENTER ONLY)
Abs Immature Granulocytes: 0.02 10*3/uL (ref 0.00–0.07)
Basophils Absolute: 0 10*3/uL (ref 0.0–0.1)
Basophils Relative: 0 %
Eosinophils Absolute: 0.1 10*3/uL (ref 0.0–0.5)
Eosinophils Relative: 3 %
HCT: 27.1 % — ABNORMAL LOW (ref 39.0–52.0)
Hemoglobin: 8.9 g/dL — ABNORMAL LOW (ref 13.0–17.0)
Immature Granulocytes: 0 %
Lymphocytes Relative: 22 %
Lymphs Abs: 1.1 10*3/uL (ref 0.7–4.0)
MCH: 32.7 pg (ref 26.0–34.0)
MCHC: 32.8 g/dL (ref 30.0–36.0)
MCV: 99.6 fL (ref 80.0–100.0)
Monocytes Absolute: 0.8 10*3/uL (ref 0.1–1.0)
Monocytes Relative: 15 %
Neutro Abs: 3 10*3/uL (ref 1.7–7.7)
Neutrophils Relative %: 60 %
Platelet Count: 318 10*3/uL (ref 150–400)
RBC: 2.72 MIL/uL — ABNORMAL LOW (ref 4.22–5.81)
RDW: 13.9 % (ref 11.5–15.5)
WBC Count: 5.1 10*3/uL (ref 4.0–10.5)
nRBC: 0 % (ref 0.0–0.2)

## 2021-01-10 LAB — TSH: TSH: 0.661 u[IU]/mL (ref 0.320–4.118)

## 2021-01-10 MED ORDER — SODIUM CHLORIDE 0.9 % IV SOLN
200.0000 mg | Freq: Once | INTRAVENOUS | Status: AC
  Administered 2021-01-10: 200 mg via INTRAVENOUS
  Filled 2021-01-10: qty 8

## 2021-01-10 MED ORDER — SODIUM CHLORIDE 0.9 % IV SOLN: Freq: Once | INTRAVENOUS | Status: AC

## 2021-01-10 NOTE — Progress Notes (Signed)
[place] [phone]   [phone]  OFFICE PROGRESS NOTE  [name], MD [address] [place] [address]  DIAGNOSIS:  Stage IV (T1b, N3, M1c) non-small cell lung cancer, adenocarcinoma. 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  Biomarker Findings Tumor Mutational Burden - 10 Muts/Mb Microsatellite status - MS-Stable Genomic Findings For a complete list of the genes assayed, please refer to the Appendix. ERBB2 amplification - equivocal? WNI62 V03* FANCC splice site 500+9F>G SMAD4 Q289* TP53 E298* 7 Disease relevant genes with no reportable alterations: ALK, BRAF, EGFR, KRAS, MET, RET, ROS1    PDL1 expression is 30%.  PRIOR THERAPY: 1) Weekly concurrent chemoradiation with carboplatin for an AUC of 2 and paclitaxel 45 mg/m2 to the locally advanced disease in the chest. He started the first dose 01/31/2019.  Status post 7 cycles.  Last dose of chemotherapy was given on March 14, 2019. 2) palliative radiotherapy to the metastatic disease in the right adrenal gland. 3) palliative radiotherapy to the metastatic deposit in the right obturator externus and abductor magnus under the care of Dr. [name].   CURRENT THERAPY: Systemic chemotherapy with carboplatin for AUC of 5, Alimta 500 mg/M2 and Keytruda 200 mg IV every 3 weeks.  First dose April 20, 2019. Status post 30 cycles.   Starting from cycle #5, the patient is on maintenance treatment with Alimta and Keytruda every 3 weeks.  Recently the patient has been on treatment with single agent Keytruda secondary to renal insufficiency.  INTERVAL HISTORY: Trevor Bailey 75 y.o. male returns to the clinic today for follow-up visit.  The patient is feeling fine today with no concerning complaints.  He was  seen by his primary care physician for the left shoulder pain and he was started on gabapentin twice daily and he started feeling some improvement.  He denied having any current chest pain, shortness of breath, cough or hemoptysis.  He denied having any fever or chills.  He has no nausea, vomiting, diarrhea or constipation.  He has no worsening fatigue.  He has no bleeding issues.  He is here today for evaluation before starting cycle #31 of his treatment.   MEDICAL HISTORY: Past Medical History:  Diagnosis Date   Adenopathy    left supraclavicular lymph node   Allergies    COPD (chronic obstructive pulmonary disease) ([place])    Coronary disease    Diabetes ([place])    Enlarged prostate    GERD (gastroesophageal reflux disease)    Glaucoma    Hyperlipidemia    Hypertension    Myocardial infarction ([place])    nscl ca dx'd 12/2018   Sleep apnea    wears CPAP   Wears partial dentures    upper and lower    ALLERGIES:  has No Known Allergies.  MEDICATIONS:  Current Outpatient Medications  Medication Sig Dispense Refill   gabapentin (NEURONTIN) 300 MG capsule Take 300 mg by mouth 2 (two) times daily.     ACCU-CHEK GUIDE test strip      albuterol (VENTOLIN HFA) 108 (90 Base) MCG/ACT inhaler      amLODipine (NORVASC) 5 MG tablet TAKE 1 TABLET BY MOUTH  DAILY 90 tablet 0   apixaban (ELIQUIS) 5 MG TABS tablet Take 1 tablet (5 mg total) by mouth 2 (two) times  daily. 60 tablet 0   aspirin EC 81 MG tablet Take 81 mg by mouth daily.     atorvastatin (LIPITOR) 80 MG tablet Take 80 mg by mouth daily.     budesonide-formoterol (SYMBICORT) 160-4.5 MCG/ACT inhaler Inhale 2 puffs into the lungs 2 (two) times daily.     ezetimibe (ZETIA) 10 MG tablet Take 10 mg by mouth daily.     finasteride (PROSCAR) 5 MG tablet Take 5 mg by mouth daily.      folic acid (FOLVITE) 1 MG tablet TAKE 1 TABLET BY MOUTH  DAILY 90 tablet 3   hydrochlorothiazide (HYDRODIURIL) 25 MG tablet Take 25 mg by mouth daily.       HYDROcodone-acetaminophen (NORCO/VICODIN) 5-325 MG tablet Take 1 tablet by mouth daily as needed.     latanoprost (XALATAN) 0.005 % ophthalmic solution Place 1 drop into both eyes at bedtime.      metFORMIN (GLUCOPHAGE) 1000 MG tablet Take 1,000 mg by mouth 2 (two) times daily.      metoprolol tartrate (LOPRESSOR) 25 MG tablet Take 25 mg 2 (two) times daily by mouth.     mirtazapine (REMERON) 30 MG tablet TAKE 1 TABLET BY MOUTH AT  BEDTIME 90 tablet 3   Multiple Vitamin (MULTIVITAMIN WITH MINERALS) TABS tablet Take 1 tablet by mouth daily. Centrum Silver     nitroGLYCERIN (NITROSTAT) 0.4 MG SL tablet Place 1 tablet (0.4 mg total) under the tongue every 5 (five) minutes as needed for chest pain. 25 tablet 6   ofloxacin (FLOXIN) 0.3 % OTIC solution      OVER THE COUNTER MEDICATION Take 1 tablet by mouth daily as needed. Colace     pantoprazole (PROTONIX) 40 MG tablet Take 40 mg daily by mouth.     Pharmacist Choice Lancets MISC      Polyethyl Glycol-Propyl Glycol (SYSTANE) 0.4-0.3 % SOLN Place 1-2 drops into both eyes 3 (three) times daily as needed (dry/irritated eyes.).     prochlorperazine (COMPAZINE) 10 MG tablet Take 10 mg by mouth daily as needed for nausea or vomiting.     ramipril (ALTACE) 10 MG capsule Take 10 mg by mouth 2 (two) times daily.      Respiratory Therapy Supplies (CARETOUCH 2 CPAP HOSE HANGER) MISC See admin instructions.     traMADol (ULTRAM) 50 MG tablet Take 50 mg by mouth daily.     No current facility-administered medications for this visit.    SURGICAL HISTORY:  Past Surgical History:  Procedure Laterality Date   ABDOMINAL AORTAGRAM Left 09/16/2013   Procedure: ABDOMINAL AORTAGRAM;  Surgeon: Elam Dutch, MD;  Location[facility] C[facility] LAB;  Service: Cardiovascular;  Laterality: Left;   CARDIAC CATHETERIZATION  01/2011   When there was segmental stenosis of the distal RCA, patent PCA stent, patent circumflex stent, and a patent small diagonal with 90% ISR.     COLONOSCOPY W/ BIOPSIES AND POLYPECTOMY     CORONARY ANGIOPLASTY  may 2002   Non-DES stenting of his circumflex, non-DES stenting of the RCA   HYDROCELE EXCISION / REPAIR  2009   by Dr Jackquline Bosch HERNIA REPAIR Bilateral    Dr Bubba Camp   LYMPH NODE BIOPSY Left 01/10/2019   Procedure: left supraclavicular LYMPH NODE BIOPSY;  Surgeon: [name], MD;  Location: [place];  Service: Thoracic;  Laterality: Left;   MULTIPLE TOOTH EXTRACTIONS     NM [place]  01/08/2011   moderate in size and intensity area of reversible ischemia  in the basal to mid inferior and septal territories. Abnormal study   stents  2008   proximal RCA, DES for progession of disease.   US ECHOCARDIOGRAPHY  01/12/2012   mild concentric LVH, borderline LA enlargement, mild to mod TR    REVIEW OF SYSTEMS:  A comprehensive review of systems was negative except for: Constitutional: positive for fatigue Musculoskeletal: positive for left shoulder pain    PHYSICAL EXAMINATION: General appearance: alert, cooperative, and no distress Head: Normocephalic, without obvious abnormality, atraumatic Neck: no adenopathy, no JVD, supple, symmetrical, trachea midline, and thyroid not enlarged, symmetric, no tenderness/mass/nodules Lymph nodes: Cervical, supraclavicular, and axillary nodes normal. Resp: clear to auscultation bilaterally Back: symmetric, no curvature. ROM normal. No CVA tenderness. Cardio: regular rate and rhythm, S1, S2 normal, no murmur, click, rub or gallop GI: soft, non-tender; bowel sounds normal; no masses,  no organomegaly Extremities: extremities normal, atraumatic, no cyanosis or edema  ECOG PERFORMANCE STATUS: 1 - Symptomatic but completely ambulatory  Blood pressure 102/68, pulse 86, temperature (!) 97.5 F (36.4 C), temperature source Tympanic, resp. rate 19, height 5' 9.5" (1.765 m), weight 167 lb 9.6 oz (76 kg), SpO2 100 %.  LABORATORY DATA: Lab Results  Component Value Date    WBC 5.1 01/10/2021   HGB 8.9 (L) 01/10/2021   HCT 27.1 (L) 01/10/2021   MCV 99.6 01/10/2021   PLT 318 01/10/2021      Chemistry      Component Value Date/Time   NA 139 12/20/2020 1040   K 5.3 (H) 12/20/2020 1040   CL 106 12/20/2020 1040   CO2 21 (L) 12/20/2020 1040   BUN 38 (H) 12/20/2020 1040   CREATININE 1.40 (H) 12/20/2020 1040   CREATININE 0.94 05/16/2014 1109      Component Value Date/Time   CALCIUM 10.5 (H) 12/20/2020 1040   ALKPHOS 105 12/20/2020 1040   AST 21 12/20/2020 1040   ALT 21 12/20/2020 1040   BILITOT 0.2 (L) 12/20/2020 1040       RADIOGRAPHIC STUDIES: No results found.  ASSESSMENT AND PLAN: This is a very pleasant 75 years old African-American male with stage IV non-small cell lung cancer, adenocarcinoma with no actionable mutation and PD-L1 expression of 30%, presented with locally advanced disease in the chest as well as solitary metastasis to the right adrenal gland. The patient completed a course of concurrent chemoradiation to the chest with weekly carboplatin for AUC of 2 and paclitaxel 45 mg/M2 status post 7 cycles..  The patient had evidence for disease progression and he was started on systemic chemotherapy with carboplatin, Alimta and Keytruda for 4 cycles and he is currently on maintenance treatment with Alimta and Keytruda every 3 weeks status post 26 more cycles.  He is currently on treatment with single agent Keytruda because of the renal insufficiency. The patient continues to tolerate this treatment well with no concerning adverse effects. I recommended for him to proceed with cycle #27 of his treatment with maintenance Keytruda. I will see him back for follow-up visit in 3 weeks for evaluation with repeat CT scan of the chest, abdomen pelvis for restaging of his disease. For the lack of appetite, he will continue his current treatment with Remeron. For the anemia, I recommended for the patient to start taking over the counter iron tablet few  times a week.  I will continue to monitor his hemoglobin and hematocrit closely. The patient was advised to call immediately if he has any other concerning symptoms in the interval. The patient  voices understanding of current disease status and treatment options and is in agreement with the current care plan. All questions were answered. The patient knows to call the clinic with any problems, questions or concerns. We can certainly see the patient much sooner if necessary.   Disclaimer: This note was dictated with voice recognition software. Similar sounding words can inadvertently be transcribed and may not be corrected upon review.

## 2021-01-10 NOTE — Patient Instructions (Signed)
[facility]   Discharge Instructions: *No longer need to take folic acid* Thank you for choosing [facility] to provide your oncology and hematology care.   If you have a lab appointment with the [facility], please go directly to the [facility] and check in at the registration area.   Wear comfortable clothing and clothing appropriate for easy access to any Portacath or PICC line.   We strive to give you quality time with your provider. You may need to reschedule your appointment if you arrive late (15 or more minutes).  Arriving late affects you and other patients whose appointments are after yours.  Also, if you miss three or more appointments without notifying the office, you may be dismissed from the clinic at the provider's discretion.      For prescription refill requests, have your pharmacy contact our office and allow 72 hours for refills to be completed.    Today you received the following chemotherapy and/or immunotherapy agents: Pembrolizumab Beryle Flock)      To help prevent nausea and vomiting after your treatment, we encourage you to take your nausea medication as directed.  BELOW ARE SYMPTOMS THAT SHOULD BE REPORTED IMMEDIATELY: *FEVER GREATER THAN 100.4 F (38 C) OR HIGHER *CHILLS OR SWEATING *NAUSEA AND VOMITING THAT IS NOT CONTROLLED WITH YOUR NAUSEA MEDICATION *UNUSUAL SHORTNESS OF BREATH *UNUSUAL BRUISING OR BLEEDING *URINARY PROBLEMS (pain or burning when urinating, or frequent urination) *BOWEL PROBLEMS (unusual diarrhea, constipation, pain near the anus) TENDERNESS IN MOUTH AND THROAT WITH OR WITHOUT PRESENCE OF ULCERS (sore throat, sores in mouth, or a toothache) UNUSUAL RASH, SWELLING OR PAIN  UNUSUAL VAGINAL DISCHARGE OR ITCHING   Items with * indicate a potential emergency and should be followed up as soon as possible or go to the Emergency Department if any problems should occur.  Please show the CHEMOTHERAPY  ALERT CARD or IMMUNOTHERAPY ALERT CARD at check-in to the Emergency Department and triage nurse.  Should you have questions after your visit or need to cancel or reschedule your appointment, please contact [facility]  Dept: [phone]  and follow the prompts.  Office hours are 8:00 a.m. to 4:30 p.m. Monday - Friday. Please note that voicemails left after 4:00 p.m. may not be returned until the following business day.  We are closed weekends and major holidays. You have access to a nurse at all times for urgent questions. Please call the main number to the clinic Dept: [phone] and follow the prompts.   For any non-urgent questions, you may also contact your provider using MyChart. We now offer e-Visits for anyone 51 and older to request care online for non-urgent symptoms. For details visit [email or web address].   Also download the MyChart app! Go to the app store, search "MyChart", open the app, select [facility], and log in with your MyChart username and password.  Due to Covid, a mask is required upon entering the hospital/clinic. If you do not have a mask, one will be given to you upon arrival. For doctor visits, patients may have 1 support person aged 67 or older with them. For treatment visits, patients cannot have anyone with them due to current Covid guidelines and our immunocompromised population.

## 2021-01-23 DIAGNOSIS — M19012 Primary osteoarthritis, left shoulder: Secondary | ICD-10-CM | POA: Diagnosis not present

## 2021-01-28 ENCOUNTER — Telehealth: Payer: Self-pay

## 2021-01-28 DIAGNOSIS — I4891 Unspecified atrial fibrillation: Secondary | ICD-10-CM

## 2021-01-28 MED ORDER — APIXABAN 5 MG PO TABS: 5.0000 mg | ORAL_TABLET | Freq: Two times a day (BID) | ORAL | 5 refills | Status: DC

## 2021-01-28 NOTE — Telephone Encounter (Signed)
Pt last saw Almyra Deforest, [place] on 12/31/20, last labs 01/10/21 Creat 1.29, age 75, weight 76kg, based on specified criteria pt is on appropriate dosage of Eliquis 5mg  BID for afib.  Will refill rx.

## 2021-01-28 NOTE — Telephone Encounter (Signed)
Patient calling the office for samples of medication:     1.  What medication and dosage are you requesting samples for?  apixaban (ELIQUIS) 5 MG TABS tablet [[id number]]  2.  Are you currently out of this medication? Pt only has 3 days left.  He is waiting on his mail order to come in. He can not afford a 30 supply at the his local drug store so he wants to know if the office has any samples he could have   Best number  [phone]

## 2021-01-29 ENCOUNTER — Other Ambulatory Visit: Payer: Self-pay

## 2021-01-29 ENCOUNTER — Ambulatory Visit (HOSPITAL_COMMUNITY)
Admission: RE | Admit: 2021-01-29 | Discharge: 2021-01-29 | Disposition: A | Discharge status: Home / Self Care | Payer: Medicare Other | Source: Ambulatory Visit | Attending: Internal Medicine | Admitting: Internal Medicine

## 2021-01-29 ENCOUNTER — Other Ambulatory Visit: Payer: Self-pay | Admitting: Cardiovascular Disease

## 2021-01-29 DIAGNOSIS — I700 Atherosclerosis of aorta: Secondary | ICD-10-CM | POA: Diagnosis not present

## 2021-01-29 DIAGNOSIS — K7689 Other specified diseases of liver: Secondary | ICD-10-CM | POA: Diagnosis not present

## 2021-01-29 DIAGNOSIS — C3490 Malignant neoplasm of unspecified part of unspecified bronchus or lung: Secondary | ICD-10-CM | POA: Insufficient documentation

## 2021-01-29 DIAGNOSIS — Z5111 Encounter for antineoplastic chemotherapy: Secondary | ICD-10-CM | POA: Diagnosis not present

## 2021-01-29 NOTE — Telephone Encounter (Signed)
Ok to give Eliquis 5mg  BID samples x 1 week

## 2021-01-29 NOTE — Telephone Encounter (Signed)
  Pt is calling back to f/u, he is running our of meds

## 2021-01-29 NOTE — Telephone Encounter (Signed)
Called patient, advised samples were up front.  Patient verbalized understanding, thankful for call back,.

## 2021-01-31 ENCOUNTER — Inpatient Hospital Stay: Payer: Medicare Other

## 2021-01-31 ENCOUNTER — Inpatient Hospital Stay: Payer: Medicare Other | Attending: Internal Medicine | Admitting: Internal Medicine

## 2021-01-31 ENCOUNTER — Encounter: Payer: Self-pay | Admitting: Internal Medicine

## 2021-01-31 ENCOUNTER — Other Ambulatory Visit: Payer: Self-pay

## 2021-01-31 VITALS — BP 108/72 | HR 95 | Temp 97.2°F | Resp 18 | Ht 69.5 in | Wt 171.0 lb

## 2021-01-31 DIAGNOSIS — C3412 Malignant neoplasm of upper lobe, left bronchus or lung: Secondary | ICD-10-CM

## 2021-01-31 DIAGNOSIS — C7971 Secondary malignant neoplasm of right adrenal gland: Secondary | ICD-10-CM | POA: Diagnosis not present

## 2021-01-31 DIAGNOSIS — Z5111 Encounter for antineoplastic chemotherapy: Secondary | ICD-10-CM

## 2021-01-31 DIAGNOSIS — D6481 Anemia due to antineoplastic chemotherapy: Secondary | ICD-10-CM | POA: Insufficient documentation

## 2021-01-31 DIAGNOSIS — C7989 Secondary malignant neoplasm of other specified sites: Secondary | ICD-10-CM | POA: Insufficient documentation

## 2021-01-31 DIAGNOSIS — Z79899 Other long term (current) drug therapy: Secondary | ICD-10-CM | POA: Insufficient documentation

## 2021-01-31 DIAGNOSIS — Z5112 Encounter for antineoplastic immunotherapy: Secondary | ICD-10-CM | POA: Diagnosis not present

## 2021-01-31 DIAGNOSIS — R918 Other nonspecific abnormal finding of lung field: Secondary | ICD-10-CM

## 2021-01-31 LAB — CBC WITH DIFFERENTIAL (CANCER CENTER ONLY)
Abs Immature Granulocytes: 0.02 10*3/uL (ref 0.00–0.07)
Basophils Absolute: 0 10*3/uL (ref 0.0–0.1)
Basophils Relative: 0 %
Eosinophils Absolute: 0.1 10*3/uL (ref 0.0–0.5)
Eosinophils Relative: 1 %
HCT: 28.5 % — ABNORMAL LOW (ref 39.0–52.0)
Hemoglobin: 9.2 g/dL — ABNORMAL LOW (ref 13.0–17.0)
Immature Granulocytes: 0 %
Lymphocytes Relative: 12 %
Lymphs Abs: 1 10*3/uL (ref 0.7–4.0)
MCH: 31.1 pg (ref 26.0–34.0)
MCHC: 32.3 g/dL (ref 30.0–36.0)
MCV: 96.3 fL (ref 80.0–100.0)
Monocytes Absolute: 0.9 10*3/uL (ref 0.1–1.0)
Monocytes Relative: 11 %
Neutro Abs: 6.3 10*3/uL (ref 1.7–7.7)
Neutrophils Relative %: 76 %
Platelet Count: 331 10*3/uL (ref 150–400)
RBC: 2.96 MIL/uL — ABNORMAL LOW (ref 4.22–5.81)
RDW: 13.9 % (ref 11.5–15.5)
WBC Count: 8.4 10*3/uL (ref 4.0–10.5)
nRBC: 0 % (ref 0.0–0.2)

## 2021-01-31 LAB — TSH: TSH: 1.076 u[IU]/mL (ref 0.320–4.118)

## 2021-01-31 LAB — CMP (CANCER CENTER ONLY)
ALT: 25 U/L (ref 0–44)
AST: 18 U/L (ref 15–41)
Albumin: 2.5 g/dL — ABNORMAL LOW (ref 3.5–5.0)
Alkaline Phosphatase: 112 U/L (ref 38–126)
Anion gap: 11 (ref 5–15)
BUN: 23 mg/dL (ref 8–23)
CO2: 23 mmol/L (ref 22–32)
Calcium: 9.8 mg/dL (ref 8.9–10.3)
Chloride: 104 mmol/L (ref 98–111)
Creatinine: 1.38 mg/dL — ABNORMAL HIGH (ref 0.61–1.24)
GFR, Estimated: 53 mL/min — ABNORMAL LOW (ref >60)
Glucose, Bld: 161 mg/dL — ABNORMAL HIGH (ref 70–99)
Potassium: 4.4 mmol/L (ref 3.5–5.1)
Sodium: 138 mmol/L (ref 135–145)
Total Bilirubin: 0.2 mg/dL — ABNORMAL LOW (ref 0.3–1.2)
Total Protein: 7.2 g/dL (ref 6.5–8.1)

## 2021-01-31 MED ORDER — SODIUM CHLORIDE 0.9 % IV SOLN
200.0000 mg | Freq: Once | INTRAVENOUS | Status: AC
  Administered 2021-01-31: 200 mg via INTRAVENOUS
  Filled 2021-01-31: qty 8

## 2021-01-31 MED ORDER — SODIUM CHLORIDE 0.9 % IV SOLN: Freq: Once | INTRAVENOUS | Status: AC

## 2021-01-31 NOTE — Progress Notes (Signed)
[facility] [phone]   [phone]  OFFICE PROGRESS NOTE  [name], MD [address] [place]  [address]  DIAGNOSIS:  Stage IV (T1b, N3, M1c) non-small cell lung cancer, adenocarcinoma. 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  Biomarker Findings Tumor Mutational Burden - 10 Muts/Mb Microsatellite status - MS-Stable Genomic Findings For a complete list of the genes assayed, please refer to the Appendix. ERBB2 amplification - equivocal? TDH74 B63* FANCC splice site 845+3M>I SMAD4 Q289* TP53 E298* 7 Disease relevant genes with no reportable alterations: ALK, BRAF, EGFR, KRAS, MET, RET, ROS1    PDL1 expression is 30%.  PRIOR THERAPY: 1) Weekly concurrent chemoradiation with carboplatin for an AUC of 2 and paclitaxel 45 mg/m2 to the locally advanced disease in the chest. He started the first dose 01/31/2019.  Status post 7 cycles.  Last dose of chemotherapy was given on March 14, 2019. 2) palliative radiotherapy to the metastatic disease in the right adrenal gland. 3) palliative radiotherapy to the metastatic deposit in the right obturator externus and abductor magnus under the care of Dr. [name].   CURRENT THERAPY: Systemic chemotherapy with carboplatin for AUC of 5, Alimta 500 mg/M2 and Keytruda 200 mg IV every 3 weeks.  First dose April 20, 2019. Status post 31 cycles.   Starting from cycle #5, the patient is on maintenance treatment with Alimta and Keytruda every 3 weeks.  Recently the patient has been on treatment with single agent Keytruda secondary to renal insufficiency.  INTERVAL HISTORY: Trevor Bailey 75 y.o. male returns to the clinic today for follow-up visit.  The patient is feeling fine today with no concerning complaints except for  mild soreness in his foot this morning.  He rubbed it with alcohol and felt much better.  He denied having any current chest pain, shortness of breath, cough or hemoptysis.  He denied having any fever or chills.  He has no nausea, vomiting, diarrhea or constipation.  He denied having any headache or visual changes.  He has no recent weight loss or night sweats.  He continues to tolerate his treatment with Keytruda fairly well.  The patient had repeat CT scan of the chest, abdomen pelvis performed recently and he is here for evaluation and discussion of his scan results.  MEDICAL HISTORY: Past Medical History:  Diagnosis Date   Adenopathy    left supraclavicular lymph node   Allergies    COPD (chronic obstructive pulmonary disease) ([place])    Coronary disease    Diabetes ([place])    Enlarged prostate    GERD (gastroesophageal reflux disease)    Glaucoma    Hyperlipidemia    Hypertension    Myocardial infarction ([place])    nscl ca dx'd 12/2018   Sleep apnea    wears CPAP   Wears partial dentures    upper and lower    ALLERGIES:  has No Known Allergies.  MEDICATIONS:  Current Outpatient Medications  Medication Sig Dispense Refill   ACCU-CHEK GUIDE test strip      albuterol (VENTOLIN HFA) 108 (90 Base) MCG/ACT inhaler      amLODipine (NORVASC) 5 MG tablet TAKE 1 TABLET BY MOUTH  DAILY 90 tablet 3   apixaban (ELIQUIS) 5 MG TABS tablet Take 1 tablet (5 mg total) by mouth 2 (  two) times daily. 60 tablet 5   aspirin EC 81 MG tablet Take 81 mg by mouth daily.     atorvastatin (LIPITOR) 80 MG tablet Take 80 mg by mouth daily.     budesonide-formoterol (SYMBICORT) 160-4.5 MCG/ACT inhaler Inhale 2 puffs into the lungs 2 (two) times daily.     ezetimibe (ZETIA) 10 MG tablet Take 10 mg by mouth daily.     finasteride (PROSCAR) 5 MG tablet Take 5 mg by mouth daily.      folic acid (FOLVITE) 1 MG tablet TAKE 1 TABLET BY MOUTH  DAILY 90 tablet 3   gabapentin (NEURONTIN) 300 MG capsule Take 300 mg by  mouth 2 (two) times daily.     hydrochlorothiazide (HYDRODIURIL) 25 MG tablet Take 25 mg by mouth daily.      HYDROcodone-acetaminophen (NORCO/VICODIN) 5-325 MG tablet Take 1 tablet by mouth daily as needed.     latanoprost (XALATAN) 0.005 % ophthalmic solution Place 1 drop into both eyes at bedtime.      metFORMIN (GLUCOPHAGE) 1000 MG tablet Take 1,000 mg by mouth 2 (two) times daily.      metoprolol tartrate (LOPRESSOR) 25 MG tablet Take 25 mg 2 (two) times daily by mouth.     mirtazapine (REMERON) 30 MG tablet TAKE 1 TABLET BY MOUTH AT  BEDTIME 90 tablet 3   Multiple Vitamin (MULTIVITAMIN WITH MINERALS) TABS tablet Take 1 tablet by mouth daily. Centrum Silver     nitroGLYCERIN (NITROSTAT) 0.4 MG SL tablet Place 1 tablet (0.4 mg total) under the tongue every 5 (five) minutes as needed for chest pain. 25 tablet 6   ofloxacin (FLOXIN) 0.3 % OTIC solution      OVER THE COUNTER MEDICATION Take 1 tablet by mouth daily as needed. Colace     pantoprazole (PROTONIX) 40 MG tablet Take 40 mg daily by mouth.     Pharmacist Choice Lancets MISC      Polyethyl Glycol-Propyl Glycol (SYSTANE) 0.4-0.3 % SOLN Place 1-2 drops into both eyes 3 (three) times daily as needed (dry/irritated eyes.).     prochlorperazine (COMPAZINE) 10 MG tablet Take 10 mg by mouth daily as needed for nausea or vomiting.     ramipril (ALTACE) 10 MG capsule Take 10 mg by mouth 2 (two) times daily.      Respiratory Therapy Supplies (CARETOUCH 2 CPAP HOSE HANGER) MISC See admin instructions.     traMADol (ULTRAM) 50 MG tablet Take 50 mg by mouth daily.     No current facility-administered medications for this visit.    SURGICAL HISTORY:  Past Surgical History:  Procedure Laterality Date   ABDOMINAL AORTAGRAM Left 09/16/2013   Procedure: ABDOMINAL AORTAGRAM;  Surgeon: Sherren Kerns, MD;  Location: [facility] CATH LAB;  Service: Cardiovascular;  Laterality: Left;   CARDIAC CATHETERIZATION  01/2011   When there was segmental stenosis of the  distal RCA, patent PCA stent, patent circumflex stent, and a patent small diagonal with 90% ISR.    COLONOSCOPY W/ BIOPSIES AND POLYPECTOMY     CORONARY ANGIOPLASTY  may 2002   Non-DES stenting of his circumflex, non-DES stenting of the RCA   HYDROCELE EXCISION / REPAIR  2009   by Dr Eliseo Squires HERNIA REPAIR Bilateral    Dr Lurene Shadow   LYMPH NODE BIOPSY Left 01/10/2019   Procedure: left supraclavicular LYMPH NODE BIOPSY;  Surgeon: Corliss Skains, MD;  Location: [facility] OR;  Service: Thoracic;  Laterality: Left;   MULTIPLE TOOTH EXTRACTIONS  NM MYOCAR PERF WALL MOTION  01/08/2011   moderate in size and intensity area of reversible ischemia in the basal to mid inferior and septal territories. Abnormal study   stents  2008   proximal RCA, DES for progession of disease.   US ECHOCARDIOGRAPHY  01/12/2012   mild concentric LVH, borderline LA enlargement, mild to mod TR    REVIEW OF SYSTEMS:  Constitutional: positive for fatigue Eyes: negative Ears, nose, mouth, throat, and face: negative Respiratory: negative Cardiovascular: negative Gastrointestinal: negative Genitourinary:negative Integument/breast: negative Hematologic/lymphatic: negative Musculoskeletal:negative Neurological: negative Behavioral/Psych: negative Endocrine: negative Allergic/Immunologic: negative   PHYSICAL EXAMINATION: General appearance: alert, cooperative, and no distress Head: Normocephalic, without obvious abnormality, atraumatic Neck: no adenopathy, no JVD, supple, symmetrical, trachea midline, and thyroid not enlarged, symmetric, no tenderness/mass/nodules Lymph nodes: Cervical, supraclavicular, and axillary nodes normal. Resp: clear to auscultation bilaterally Back: symmetric, no curvature. ROM normal. No CVA tenderness. Cardio: regular rate and rhythm, S1, S2 normal, no murmur, click, rub or gallop GI: soft, non-tender; bowel sounds normal; no masses,  no organomegaly Extremities: extremities normal,  atraumatic, no cyanosis or edema Neurologic: Alert and oriented X 3, normal strength and tone. Normal symmetric reflexes. Normal coordination and gait  ECOG PERFORMANCE STATUS: 1 - Symptomatic but completely ambulatory  Blood pressure 108/72, pulse 95, temperature (!) 97.2 F (36.2 C), temperature source Tympanic, resp. rate 18, height 5' 9.5" (1.765 m), weight 171 lb (77.6 kg), SpO2 100 %.  LABORATORY DATA: Lab Results  Component Value Date   WBC 8.4 01/31/2021   HGB 9.2 (L) 01/31/2021   HCT 28.5 (L) 01/31/2021   MCV 96.3 01/31/2021   PLT 331 01/31/2021      Chemistry      Component Value Date/Time   NA 139 01/10/2021 0949   K 4.6 01/10/2021 0949   CL 108 01/10/2021 0949   CO2 22 01/10/2021 0949   BUN 28 (H) 01/10/2021 0949   CREATININE 1.29 (H) 01/10/2021 0949   CREATININE 0.94 05/16/2014 1109      Component Value Date/Time   CALCIUM 9.3 01/10/2021 0949   ALKPHOS 108 01/10/2021 0949   AST 13 (L) 01/10/2021 0949   ALT 19 01/10/2021 0949   BILITOT 0.3 01/10/2021 0949       RADIOGRAPHIC STUDIES: CT Abdomen Pelvis Wo Contrast  Result Date: 01/30/2021 CLINICAL DATA:  Primary Cancer Type: Lung Imaging Indication: Assess response to therapy Interval therapy since last imaging? Yes Initial Cancer Diagnosis Date: 01/10/2019; Established by: Biopsy-proven Detailed Pathology: Stage IV non-small cell lung cancer, adenocarcinoma. Primary Tumor location: Left upper lobe. Metastasis to right adrenal gland. Surgeries: Coronary angioplasty. Inguinal hernia repair. Chemotherapy: Yes; Ongoing?  No; Most recent administration: 03/2020 Immunotherapy?  Yes; Type: Keytruda; Ongoing? Yes Radiation therapy? Yes Date Range: 01/31/2020 - 02/17/2020; Target: Right pelvis Date Range: 01/31/2019 - 04/07/2019; Target: Left lung and right adrenal gland EXAM: CT CHEST, ABDOMEN AND PELVIS WITHOUT CONTRAST TECHNIQUE: Multidetector CT imaging of the chest, abdomen and pelvis was performed following the  standard protocol without IV contrast. COMPARISON:  Most recent CT chest, abdomen and pelvis 11/01/2020. 12/06/2018 PET-CT. FINDINGS: CT CHEST FINDINGS Cardiovascular: Coronary artery calcification and aortic atherosclerotic calcification. Mediastinum/Nodes: No axillary or supraclavicular adenopathy. No mediastinal or hilar adenopathy. No pericardial fluid. Esophagus normal. Lungs/Pleura: Linear fibrosis in the medial LEFT upper lobe consistent radiation change. No suspicious nodularity in the LEFT lung. No RIGHT lung nodularity. Musculoskeletal: No aggressive osseous lesion. CT ABDOMEN AND PELVIS FINDINGS Hepatobiliary: Several low-density lesions in liver on noncontrast exam.  Lesions have simple fluid attenuation. Pancreas: Pancreas is normal. No ductal dilatation. No pancreatic inflammation. Spleen: Normal spleen Adrenals/urinary tract: Adrenal glands normal. No adrenal mass. Kidneys, ureters and bladder normal. Stomach/Bowel: Stomach, small bowel, appendix, and cecum are normal. The colon and rectosigmoid colon are normal. Vascular/Lymphatic: Abdominal aorta is normal caliber with atherosclerotic calcification. There is no retroperitoneal or periportal lymphadenopathy. No pelvic lymphadenopathy. Reproductive: Prostate unremarkable Other: No peritoneal metastasis Musculoskeletal: Thickened trabecula in L2 vertebral bodies unchanged. No new sclerotic lesions identified. IMPRESSION: Chest Impression: 1. Postradiation change in LEFT upper lobe without evidence local recurrence. 2. No evidence of metastatic adenopathy in the thorax. Abdomen / Pelvis Impression: 1. No evidence of adrenal gland metastasis recurrence. 2. No evidence of new metastatic disease in the abdomen pelvis on noncontrast exam. 3. Stable thickened trabecula in the L2 vertebral body. Electronically Signed   By: Suzy Bouchard M.D.   On: 01/30/2021 10:16   CT Chest Wo Contrast  Result Date: 01/30/2021 CLINICAL DATA:  Primary Cancer Type: Lung  Imaging Indication: Assess response to therapy Interval therapy since last imaging? Yes Initial Cancer Diagnosis Date: 01/10/2019; Established by: Biopsy-proven Detailed Pathology: Stage IV non-small cell lung cancer, adenocarcinoma. Primary Tumor location: Left upper lobe. Metastasis to right adrenal gland. Surgeries: Coronary angioplasty. Inguinal hernia repair. Chemotherapy: Yes; Ongoing?  No; Most recent administration: 03/2020 Immunotherapy?  Yes; Type: Keytruda; Ongoing? Yes Radiation therapy? Yes Date Range: 01/31/2020 - 02/17/2020; Target: Right pelvis Date Range: 01/31/2019 - 04/07/2019; Target: Left lung and right adrenal gland EXAM: CT CHEST, ABDOMEN AND PELVIS WITHOUT CONTRAST TECHNIQUE: Multidetector CT imaging of the chest, abdomen and pelvis was performed following the standard protocol without IV contrast. COMPARISON:  Most recent CT chest, abdomen and pelvis 11/01/2020. 12/06/2018 PET-CT. FINDINGS: CT CHEST FINDINGS Cardiovascular: Coronary artery calcification and aortic atherosclerotic calcification. Mediastinum/Nodes: No axillary or supraclavicular adenopathy. No mediastinal or hilar adenopathy. No pericardial fluid. Esophagus normal. Lungs/Pleura: Linear fibrosis in the medial LEFT upper lobe consistent radiation change. No suspicious nodularity in the LEFT lung. No RIGHT lung nodularity. Musculoskeletal: No aggressive osseous lesion. CT ABDOMEN AND PELVIS FINDINGS Hepatobiliary: Several low-density lesions in liver on noncontrast exam. Lesions have simple fluid attenuation. Pancreas: Pancreas is normal. No ductal dilatation. No pancreatic inflammation. Spleen: Normal spleen Adrenals/urinary tract: Adrenal glands normal. No adrenal mass. Kidneys, ureters and bladder normal. Stomach/Bowel: Stomach, small bowel, appendix, and cecum are normal. The colon and rectosigmoid colon are normal. Vascular/Lymphatic: Abdominal aorta is normal caliber with atherosclerotic calcification. There is no  retroperitoneal or periportal lymphadenopathy. No pelvic lymphadenopathy. Reproductive: Prostate unremarkable Other: No peritoneal metastasis Musculoskeletal: Thickened trabecula in L2 vertebral bodies unchanged. No new sclerotic lesions identified. IMPRESSION: Chest Impression: 1. Postradiation change in LEFT upper lobe without evidence local recurrence. 2. No evidence of metastatic adenopathy in the thorax. Abdomen / Pelvis Impression: 1. No evidence of adrenal gland metastasis recurrence. 2. No evidence of new metastatic disease in the abdomen pelvis on noncontrast exam. 3. Stable thickened trabecula in the L2 vertebral body. Electronically Signed   By: Suzy Bouchard M.D.   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 The patient completed a course of concurrent chemoradiation to the chest with weekly carboplatin for AUC of 2 and paclitaxel  45 mg/M2 status post 7 cycles..  The patient had evidence for disease progression and he was started on systemic chemotherapy with carboplatin, Alimta and Keytruda for 4 cycles and he is currently on maintenance treatment with Alimta and Keytruda every 3 weeks status post 27 more cycles.  He is currently on treatment with single agent Keytruda because of the renal insufficiency. The patient has been tolerating his treatment with Keytruda fairly well with no concerning adverse effects. He had repeat CT scan of the chest, abdomen pelvis performed recently.  I personally and independently reviewed the scans and discussed the results with the patient today. His scan showed no concerning findings for disease progression. I recommended for him to continue his current treatment with single  agent Keytruda and he will proceed with cycle #32 today as planned. I will see him back for follow-up visit in 3 weeks for evaluation before the next cycle of his treatment.  I may consider stopping his treatment with Keytruda after cycle #35. For the lack of appetite he is currently on Remeron. 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 The patient was advised to call immediately if he has any other concerning symptoms in the interval. The patient voices understanding of current disease status and treatment options and is in agreement with the current care plan. All questions were answered. The patient knows to call the clinic with any problems, questions or concerns. We can certainly see the patient much sooner if necessary.   Disclaimer: This note was dictated with voice recognition software. Similar sounding words can inadvertently be transcribed and may not be corrected upon review.

## 2021-01-31 NOTE — Patient Instructions (Signed)
[facility]  Discharge Instructions: °Thank you for choosing [facility] to provide your oncology and hematology care.  ° °If you have a lab appointment with the Cancer Center, please go directly to the Cancer Center and check in at the registration area. °  °Wear comfortable clothing and clothing appropriate for easy access to any Portacath or PICC line.  ° °We strive to give you quality time with your provider. You may need to reschedule your appointment if you arrive late (15 or more minutes).  Arriving late affects you and other patients whose appointments are after yours.  Also, if you miss three or more appointments without notifying the office, you may be dismissed from the clinic at the provider’s discretion.    °  °For prescription refill requests, have your pharmacy contact our office and allow 72 hours for refills to be completed.   ° °Today you received the following chemotherapy and/or immunotherapy agent: Pembrolizumab (Keytruda) °  °To help prevent nausea and vomiting after your treatment, we encourage you to take your nausea medication as directed. ° °BELOW ARE SYMPTOMS THAT SHOULD BE REPORTED IMMEDIATELY: °*FEVER GREATER THAN 100.4 F (38 °C) OR HIGHER °*CHILLS OR SWEATING °*NAUSEA AND VOMITING THAT IS NOT CONTROLLED WITH YOUR NAUSEA MEDICATION °*UNUSUAL SHORTNESS OF BREATH °*UNUSUAL BRUISING OR BLEEDING °*URINARY PROBLEMS (pain or burning when urinating, or frequent urination) °*BOWEL PROBLEMS (unusual diarrhea, constipation, pain near the anus) °TENDERNESS IN MOUTH AND THROAT WITH OR WITHOUT PRESENCE OF ULCERS (sore throat, sores in mouth, or a toothache) °UNUSUAL RASH, SWELLING OR PAIN  °UNUSUAL VAGINAL DISCHARGE OR ITCHING  ° °Items with * indicate a potential emergency and should be followed up as soon as possible or go to the Emergency Department if any problems should occur. ° °Please show the CHEMOTHERAPY ALERT CARD or IMMUNOTHERAPY ALERT CARD at  check-in to the Emergency Department and triage nurse. ° °Should you have questions after your visit or need to cancel or reschedule your appointment, please contact [facility]  Dept: [phone]  and follow the prompts.  Office hours are 8:00 a.m. to 4:30 p.m. Monday - Friday. Please note that voicemails left after 4:00 p.m. may not be returned until the following business day.  We are closed weekends and major holidays. You have access to a nurse at all times for urgent questions. Please call the main number to the clinic Dept: [phone] and follow the prompts. ° ° °For any non-urgent questions, you may also contact your provider using MyChart. We now offer e-Visits for anyone 18 and older to request care online for non-urgent symptoms. For details visit [email or web address]. °  °Also download the MyChart app! Go to the app store, search "MyChart", open the app, select [facility], and log in with your MyChart username and password. ° °Due to Covid, a mask is required upon entering the hospital/clinic. If you do not have a mask, one will be given to you upon arrival. For doctor visits, patients may have 1 support person aged 18 or older with them. For treatment visits, patients cannot have anyone with them due to current Covid guidelines and our immunocompromised population.  ° °

## 2021-02-01 DIAGNOSIS — H401131 Primary open-angle glaucoma, bilateral, mild stage: Secondary | ICD-10-CM | POA: Diagnosis not present

## 2021-02-01 DIAGNOSIS — H04123 Dry eye syndrome of bilateral lacrimal glands: Secondary | ICD-10-CM | POA: Diagnosis not present

## 2021-02-01 DIAGNOSIS — E119 Type 2 diabetes mellitus without complications: Secondary | ICD-10-CM | POA: Diagnosis not present

## 2021-02-01 DIAGNOSIS — D3132 Benign neoplasm of left choroid: Secondary | ICD-10-CM | POA: Diagnosis not present

## 2021-02-01 DIAGNOSIS — H35372 Puckering of macula, left eye: Secondary | ICD-10-CM | POA: Diagnosis not present

## 2021-02-01 DIAGNOSIS — H35033 Hypertensive retinopathy, bilateral: Secondary | ICD-10-CM | POA: Diagnosis not present

## 2021-02-01 DIAGNOSIS — H353131 Nonexudative age-related macular degeneration, bilateral, early dry stage: Secondary | ICD-10-CM | POA: Diagnosis not present

## 2021-02-11 IMAGING — MR MR HEAD WO/W CM
9 of 14 series · 31 of 48 positions shown · IV contrast (Yes)
Comparison: Head CT 08/10/2009

CLINICAL DATA: Massive upper lobe of left lung, restaging lung
cancer.

EXAM:
MRI HEAD WITHOUT AND WITH CONTRAST
TECHNIQUE: Multiplanar, multiecho pulse sequences of the brain and surrounding
structures were obtained without and with intravenous contrast.
CONTRAST:  9 mL Gadavist

[Series 3: DWI · axial · 3.0mm · 1.09mm/px · z∈[-9,+149]mm · 6 of 108 slices shown (1 of 4)]
[im 1/108]
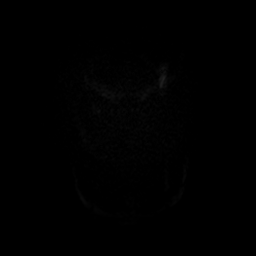
[im 22/108]
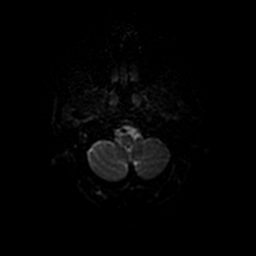
[im 43/108]
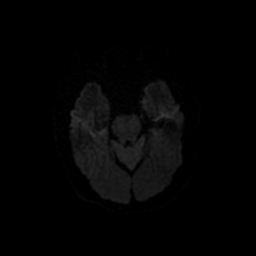
[im 65/108]
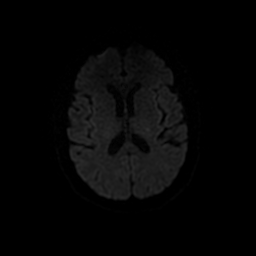
[im 86/108]
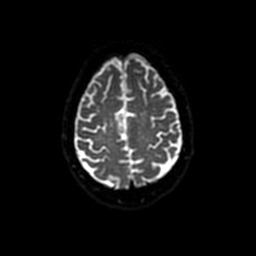
[im 108/108]
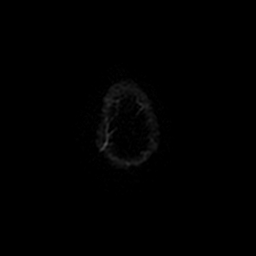

[Series 5: T2 · axial · 5.0mm · 0.43mm/px · 1 of 24 slices shown]
[im 1/24]
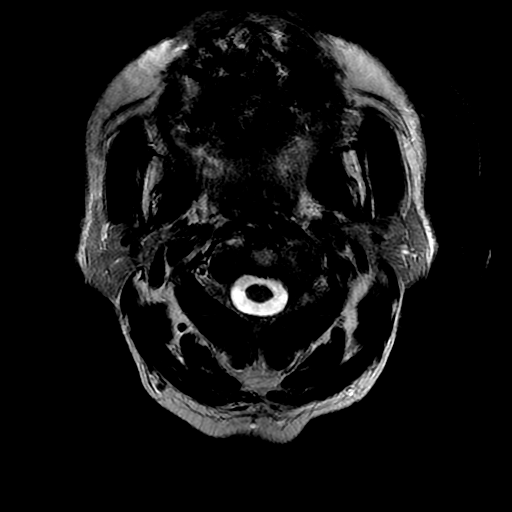

[Series 6: FLAIR · axial · 5.0mm · 0.43mm/px · z∈[-8,+148]mm · 2 of 27 slices shown]
[im 1/27]
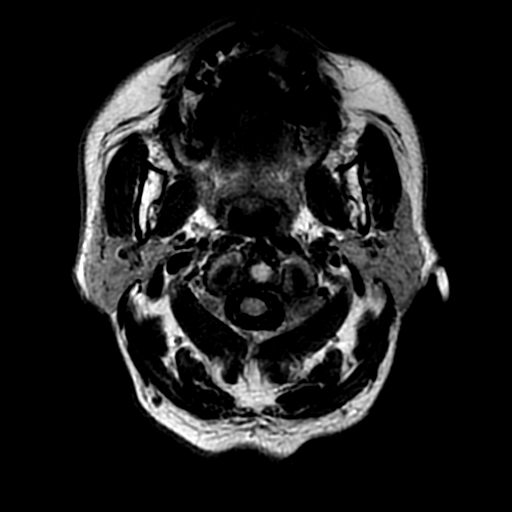
[im 27/27]
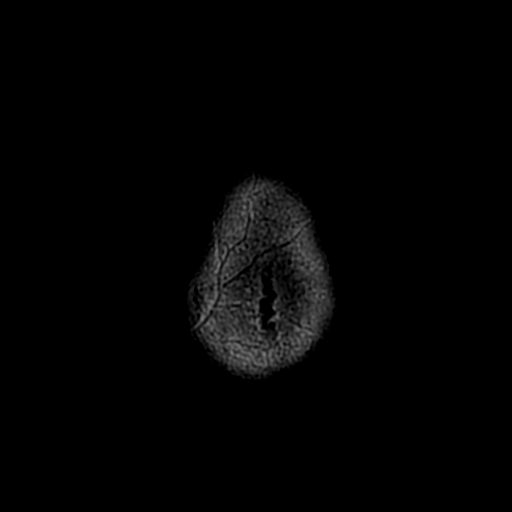

[Series 10: DWI · coronal · 4.0mm · 1.09mm/px · 5 of 86 slices shown (2 of 4)]
[im 1/86]
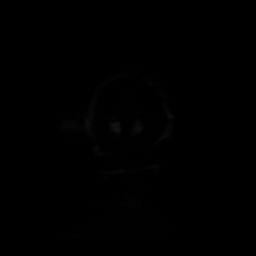
[im 22/86]
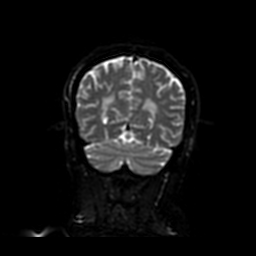
[im 43/86]
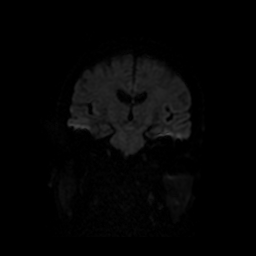
[im 64/86]
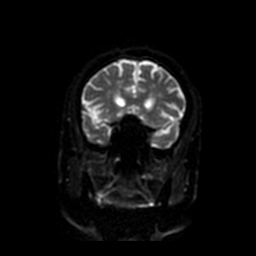
[im 86/86]
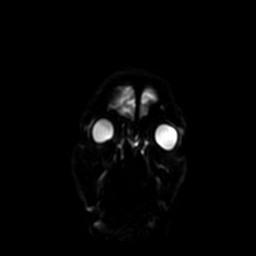

[Series 12: T1 post-contrast · axial · 1.0mm · 0.47mm/px · z∈[-11,+150]mm · 8 of 162 slices shown (1 of 3)]
[im 1/162]
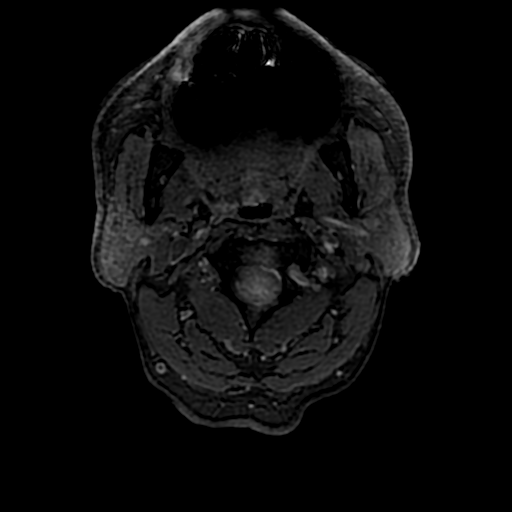
[im 21/162]
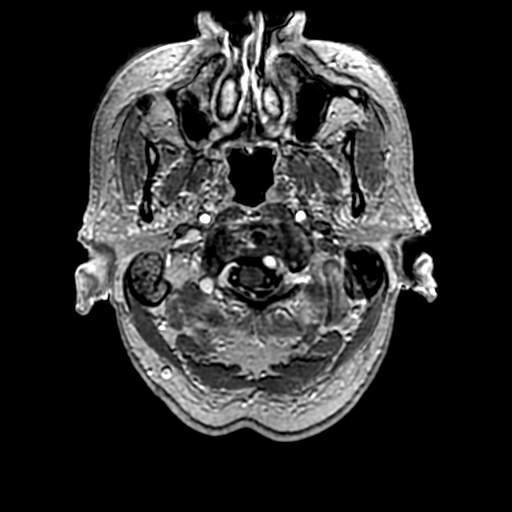
[im 41/162]
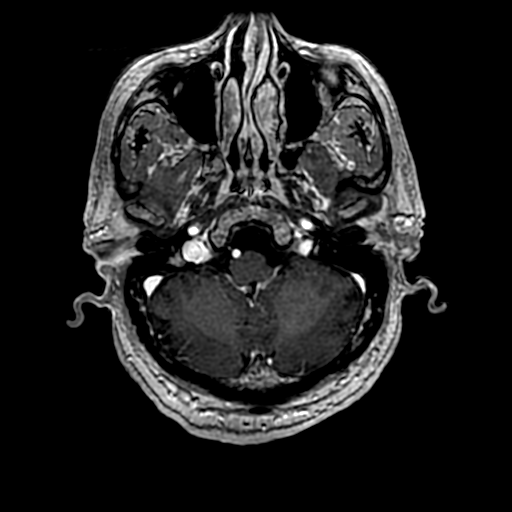
[im 61/162]
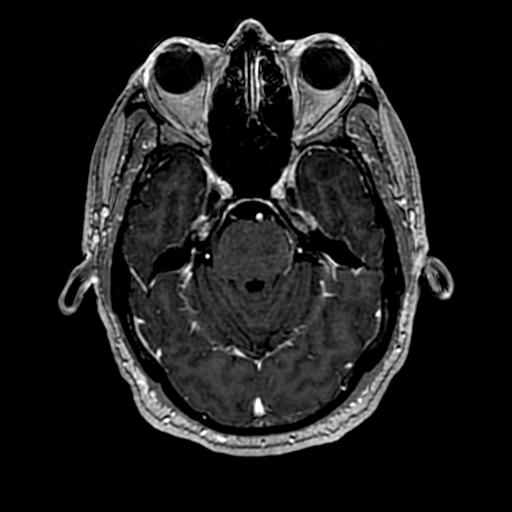
[im 101/162]
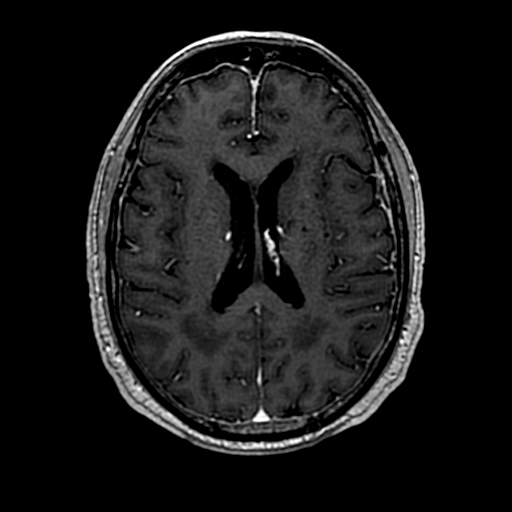
[im 121/162]
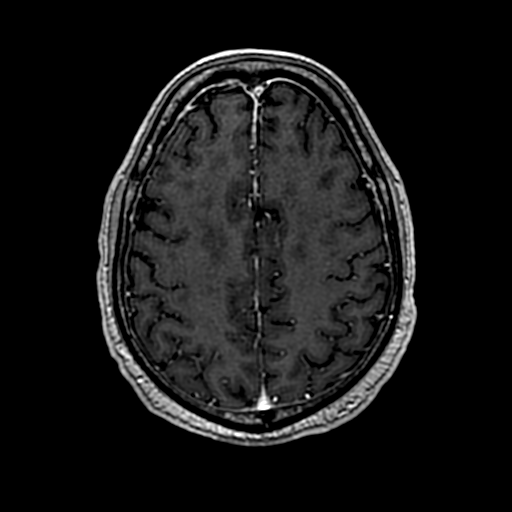
[im 141/162]
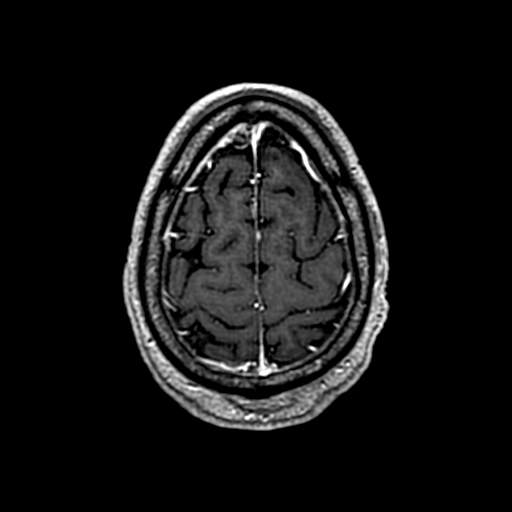
[im 162/162]
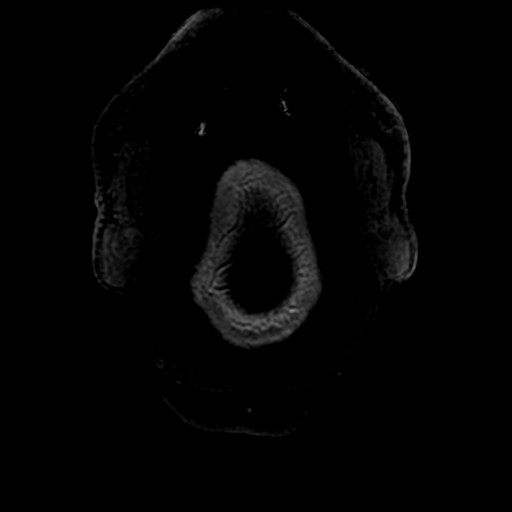

[Series 13: T1 post-contrast · coronal · 5.0mm · 0.43mm/px · 2 of 28 slices shown (2 of 3)]
[im 1/28]
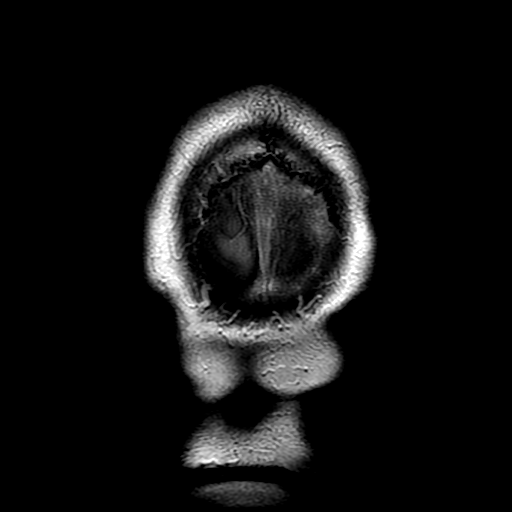
[im 28/28]
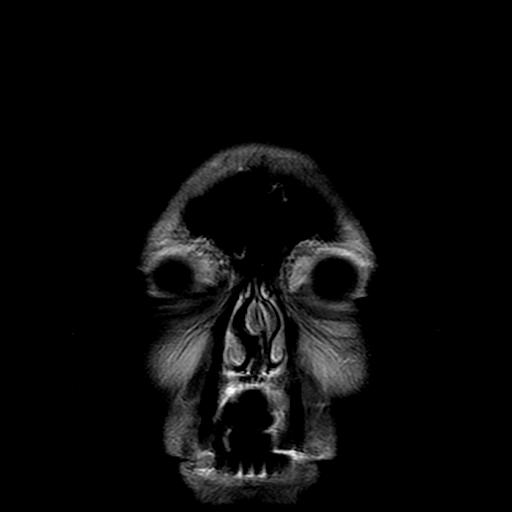

[Series 14: T1 post-contrast · sagittal · 5.0mm · 0.47mm/px · 1 of 22 slices shown (3 of 3)]
[im 1/22]
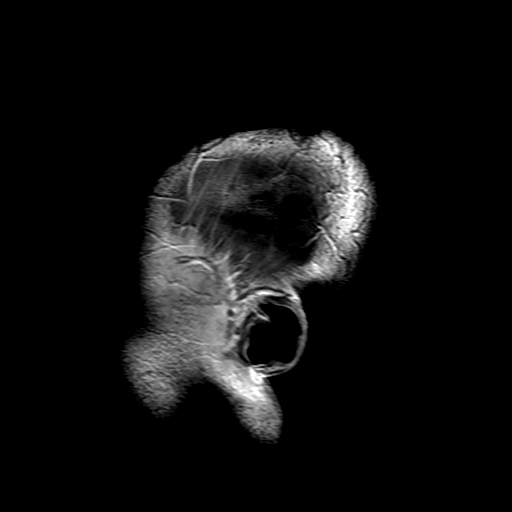

[Series 300: DWI · axial · 3.0mm · 1.09mm/px · z∈[-9,+149]mm · 3 of 54 slices shown (3 of 4)]
[im 1/54]
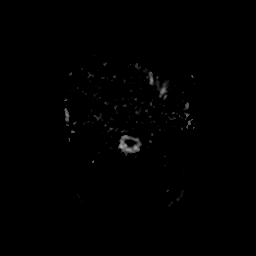
[im 27/54]
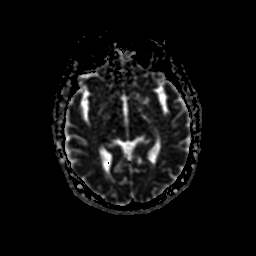
[im 54/54]
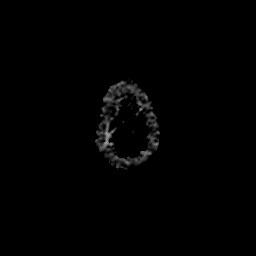

[Series 1000: DWI · coronal · 4.0mm · 1.09mm/px · 3 of 43 slices shown (4 of 4)]
[im 1/43]
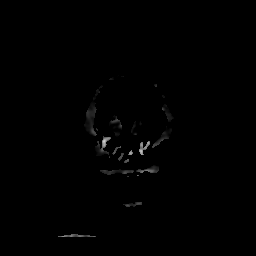
[im 22/43]
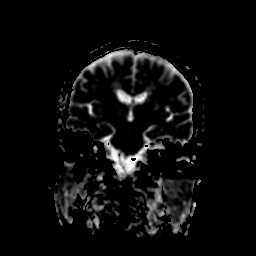
[im 43/43]
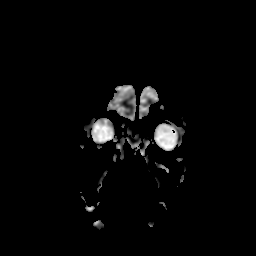

[31 of 48 positions shown; findings below may reference images not displayed]

FINDINGS: Brain: The axial acquired T2 weighted sequence is significantly
motion degraded. No abnormal intracranial enhancement is
demonstrated to suggest intracranial metastatic disease. No evidence
of acute infarct. No intracranial mass, midline shift or extra-axial
fluid collection. No chronic intracranial hemorrhage. Moderate
scattered and confluent T2/FLAIR hyperintensity within the cerebral
white matter is nonspecific, although consistent with chronic small
vessel ischemic disease. Small chronic lacunar infarct within the
right cerebellum. Cerebral volume is normal for age.

Vascular: Flow voids maintained within the proximal large arterial
vessels.

Skull and upper cervical spine: Normal marrow signal.

Sinuses/Orbits: Bilateral lens replacements. Within limitations of
motion degraded imaging, no significant paranasal sinus disease.
Right mastoid effusion.
IMPRESSION: No evidence of intracranial metastatic disease.

Moderate chronic small vessel ischemic disease. Small chronic right
cerebellar lacunar infarct.

Right mastoid effusion.

## 2021-02-21 ENCOUNTER — Other Ambulatory Visit: Payer: Self-pay

## 2021-02-21 ENCOUNTER — Inpatient Hospital Stay: Payer: Medicare Other | Attending: Internal Medicine | Admitting: Internal Medicine

## 2021-02-21 ENCOUNTER — Encounter: Payer: Self-pay | Admitting: Internal Medicine

## 2021-02-21 ENCOUNTER — Inpatient Hospital Stay: Payer: Medicare Other

## 2021-02-21 VITALS — BP 111/76 | HR 79 | Temp 96.9°F | Resp 19 | Ht 69.5 in | Wt 169.2 lb

## 2021-02-21 DIAGNOSIS — Z79899 Other long term (current) drug therapy: Secondary | ICD-10-CM | POA: Diagnosis not present

## 2021-02-21 DIAGNOSIS — C7971 Secondary malignant neoplasm of right adrenal gland: Secondary | ICD-10-CM | POA: Diagnosis not present

## 2021-02-21 DIAGNOSIS — Z5112 Encounter for antineoplastic immunotherapy: Secondary | ICD-10-CM | POA: Diagnosis not present

## 2021-02-21 DIAGNOSIS — C3412 Malignant neoplasm of upper lobe, left bronchus or lung: Secondary | ICD-10-CM

## 2021-02-21 DIAGNOSIS — R918 Other nonspecific abnormal finding of lung field: Secondary | ICD-10-CM

## 2021-02-21 LAB — CMP (CANCER CENTER ONLY)
ALT: 14 U/L (ref 0–44)
AST: 12 U/L — ABNORMAL LOW (ref 15–41)
Albumin: 2.5 g/dL — ABNORMAL LOW (ref 3.5–5.0)
Alkaline Phosphatase: 106 U/L (ref 38–126)
Anion gap: 13 (ref 5–15)
BUN: 27 mg/dL — ABNORMAL HIGH (ref 8–23)
CO2: 24 mmol/L (ref 22–32)
Calcium: 9.7 mg/dL (ref 8.9–10.3)
Chloride: 106 mmol/L (ref 98–111)
Creatinine: 1.26 mg/dL — ABNORMAL HIGH (ref 0.61–1.24)
GFR, Estimated: 59 mL/min — ABNORMAL LOW (ref >60)
Glucose, Bld: 89 mg/dL (ref 70–99)
Potassium: 4.5 mmol/L (ref 3.5–5.1)
Sodium: 143 mmol/L (ref 135–145)
Total Bilirubin: <0.2 mg/dL — ABNORMAL LOW (ref 0.3–1.2)
Total Protein: 7 g/dL (ref 6.5–8.1)

## 2021-02-21 LAB — CBC WITH DIFFERENTIAL (CANCER CENTER ONLY)
Abs Immature Granulocytes: 0.01 10*3/uL (ref 0.00–0.07)
Basophils Absolute: 0.1 10*3/uL (ref 0.0–0.1)
Basophils Relative: 1 %
Eosinophils Absolute: 0.1 10*3/uL (ref 0.0–0.5)
Eosinophils Relative: 3 %
HCT: 29.4 % — ABNORMAL LOW (ref 39.0–52.0)
Hemoglobin: 9.2 g/dL — ABNORMAL LOW (ref 13.0–17.0)
Immature Granulocytes: 0 %
Lymphocytes Relative: 23 %
Lymphs Abs: 1 10*3/uL (ref 0.7–4.0)
MCH: 30.8 pg (ref 26.0–34.0)
MCHC: 31.3 g/dL (ref 30.0–36.0)
MCV: 98.3 fL (ref 80.0–100.0)
Monocytes Absolute: 0.7 10*3/uL (ref 0.1–1.0)
Monocytes Relative: 15 %
Neutro Abs: 2.5 10*3/uL (ref 1.7–7.7)
Neutrophils Relative %: 58 %
Platelet Count: 342 10*3/uL (ref 150–400)
RBC: 2.99 MIL/uL — ABNORMAL LOW (ref 4.22–5.81)
RDW: 14.5 % (ref 11.5–15.5)
WBC Count: 4.4 10*3/uL (ref 4.0–10.5)
nRBC: 0 % (ref 0.0–0.2)

## 2021-02-21 LAB — TSH: TSH: 1.242 u[IU]/mL (ref 0.320–4.118)

## 2021-02-21 MED ORDER — SODIUM CHLORIDE 0.9 % IV SOLN: Freq: Once | INTRAVENOUS | Status: AC

## 2021-02-21 MED ORDER — SODIUM CHLORIDE 0.9 % IV SOLN
200.0000 mg | Freq: Once | INTRAVENOUS | Status: AC
  Administered 2021-02-21: 200 mg via INTRAVENOUS
  Filled 2021-02-21: qty 8

## 2021-02-21 NOTE — Patient Instructions (Signed)
[facility]  Discharge Instructions: Thank you for choosing [facility] to provide your oncology and hematology care.   If you have a lab appointment with the [facility], please go directly to the [facility] and check in at the registration area.   Wear comfortable clothing and clothing appropriate for easy access to any Portacath or PICC line.   We strive to give you quality time with your provider. You may need to reschedule your appointment if you arrive late (15 or more minutes).  Arriving late affects you and other patients whose appointments are after yours.  Also, if you miss three or more appointments without notifying the office, you may be dismissed from the clinic at the provider's discretion.      For prescription refill requests, have your pharmacy contact our office and allow 72 hours for refills to be completed.    Today you received the following chemotherapy and/or immunotherapy agents Beryle Flock      To help prevent nausea and vomiting after your treatment, we encourage you to take your nausea medication as directed.  BELOW ARE SYMPTOMS THAT SHOULD BE REPORTED IMMEDIATELY: *FEVER GREATER THAN 100.4 F (38 C) OR HIGHER *CHILLS OR SWEATING *NAUSEA AND VOMITING THAT IS NOT CONTROLLED WITH YOUR NAUSEA MEDICATION *UNUSUAL SHORTNESS OF BREATH *UNUSUAL BRUISING OR BLEEDING *URINARY PROBLEMS (pain or burning when urinating, or frequent urination) *BOWEL PROBLEMS (unusual diarrhea, constipation, pain near the anus) TENDERNESS IN MOUTH AND THROAT WITH OR WITHOUT PRESENCE OF ULCERS (sore throat, sores in mouth, or a toothache) UNUSUAL RASH, SWELLING OR PAIN  UNUSUAL VAGINAL DISCHARGE OR ITCHING   Items with * indicate a potential emergency and should be followed up as soon as possible or go to the Emergency Department if any problems should occur.  Please show the CHEMOTHERAPY ALERT CARD or IMMUNOTHERAPY ALERT CARD at check-in to  the Emergency Department and triage nurse.  Should you have questions after your visit or need to cancel or reschedule your appointment, please contact [facility]  Dept: [phone]  and follow the prompts.  Office hours are 8:00 a.m. to 4:30 p.m. Monday - Friday. Please note that voicemails left after 4:00 p.m. may not be returned until the following business day.  We are closed weekends and major holidays. You have access to a nurse at all times for urgent questions. Please call the main number to the clinic Dept: [phone] and follow the prompts.   For any non-urgent questions, you may also contact your provider using MyChart. We now offer e-Visits for anyone 55 and older to request care online for non-urgent symptoms. For details visit [email or web address].   Also download the MyChart app! Go to the app store, search "MyChart", open the app, select [facility], and log in with your MyChart username and password.  Due to Covid, a mask is required upon entering the hospital/clinic. If you do not have a mask, one will be given to you upon arrival. For doctor visits, patients may have 1 support person aged 71 or older with them. For treatment visits, patients cannot have anyone with them due to current Covid guidelines and our immunocompromised population.

## 2021-02-21 NOTE — Progress Notes (Signed)
[facility] [phone]   [phone]  OFFICE PROGRESS NOTE  [name], MD [address] [place] [address]  DIAGNOSIS:  Stage IV (T1b, N3, M1c) non-small cell lung cancer, adenocarcinoma. 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  Biomarker Findings Tumor Mutational Burden - 10 Muts/Mb Microsatellite status - MS-Stable Genomic Findings For a complete list of the genes assayed, please refer to the Appendix. ERBB2 amplification - equivocal? ERX54 M08* FANCC splice site [id number]>J SMAD4 Q289* TP53 E298* 7 Disease relevant genes with no reportable alterations: ALK, BRAF, EGFR, KRAS, MET, RET, ROS1    PDL1 expression is 30%.  PRIOR THERAPY: 1) Weekly concurrent chemoradiation with carboplatin for an AUC of 2 and paclitaxel 45 mg/m2 to the locally advanced disease in the chest. He started the first dose 01/31/2019.  Status post 7 cycles.  Last dose of chemotherapy was given on March 14, 2019. 2) palliative radiotherapy to the metastatic disease in the right adrenal gland. 3) palliative radiotherapy to the metastatic deposit in the right obturator externus and abductor magnus under the care of Dr. [name].   CURRENT THERAPY: Systemic chemotherapy with carboplatin for AUC of 5, Alimta 500 mg/M2 and Keytruda 200 mg IV every 3 weeks.  First dose April 20, 2019. Status post 32 cycles.   Starting from cycle #5, the patient is on maintenance treatment with Alimta and Keytruda every 3 weeks.  Recently the patient has been on treatment with single agent Keytruda secondary to renal insufficiency.  INTERVAL HISTORY: Trevor Bailey 75 y.o. male returns to the clinic today for follow-up visit.  The patient is feeling fine today with no concerning complaints.  He had 1  episode of vomiting few days ago.  He denied having any current chest pain, shortness of breath, cough or hemoptysis.  He has no nausea, vomiting, diarrhea or constipation today.  He has no fever or chills.  He has no headache or visual changes.  He has some dizzy spells this morning but this completely resolved.  He is here today for evaluation before starting cycle #33 of his treatment.  MEDICAL HISTORY: Past Medical History:  Diagnosis Date   Adenopathy    left supraclavicular lymph node   Allergies    COPD (chronic obstructive pulmonary disease) ([place])    Coronary disease    Diabetes ([place])    Enlarged prostate    GERD (gastroesophageal reflux disease)    Glaucoma    Hyperlipidemia    Hypertension    Myocardial infarction ([place])    nscl ca dx'd 12/2018   Sleep apnea    wears CPAP   Wears partial dentures    upper and lower    ALLERGIES:  has No Known Allergies.  MEDICATIONS:  Current Outpatient Medications  Medication Sig Dispense Refill   ACCU-CHEK GUIDE test strip      albuterol (VENTOLIN HFA) 108 (90 Base) MCG/ACT inhaler      amLODipine (NORVASC) 5 MG tablet TAKE 1 TABLET BY MOUTH  DAILY 90 tablet 3   apixaban (ELIQUIS) 5 MG TABS tablet Take 1 tablet (5 mg total) by mouth 2 (two) times daily. 60 tablet 5   aspirin EC 81 MG tablet Take 81 mg by mouth daily.     atorvastatin (LIPITOR) 80 MG tablet Take 80 mg  by mouth daily.     budesonide-formoterol (SYMBICORT) 160-4.5 MCG/ACT inhaler Inhale 2 puffs into the lungs 2 (two) times daily.     ezetimibe (ZETIA) 10 MG tablet Take 10 mg by mouth daily.     finasteride (PROSCAR) 5 MG tablet Take 5 mg by mouth daily.      folic acid (FOLVITE) 1 MG tablet TAKE 1 TABLET BY MOUTH  DAILY 90 tablet 3   gabapentin (NEURONTIN) 300 MG capsule Take 300 mg by mouth 2 (two) times daily.     hydrochlorothiazide (HYDRODIURIL) 25 MG tablet Take 25 mg by mouth daily.      HYDROcodone-acetaminophen (NORCO/VICODIN) 5-325 MG tablet Take 1 tablet by  mouth daily as needed.     latanoprost (XALATAN) 0.005 % ophthalmic solution Place 1 drop into both eyes at bedtime.      metFORMIN (GLUCOPHAGE) 1000 MG tablet Take 1,000 mg by mouth 2 (two) times daily.      metoprolol tartrate (LOPRESSOR) 25 MG tablet Take 25 mg 2 (two) times daily by mouth.     mirtazapine (REMERON) 30 MG tablet TAKE 1 TABLET BY MOUTH AT  BEDTIME 90 tablet 3   Multiple Vitamin (MULTIVITAMIN WITH MINERALS) TABS tablet Take 1 tablet by mouth daily. Centrum Silver     nitroGLYCERIN (NITROSTAT) 0.4 MG SL tablet Place 1 tablet (0.4 mg total) under the tongue every 5 (five) minutes as needed for chest pain. 25 tablet 6   ofloxacin (FLOXIN) 0.3 % OTIC solution      OVER THE COUNTER MEDICATION Take 1 tablet by mouth daily as needed. Colace     pantoprazole (PROTONIX) 40 MG tablet Take 40 mg daily by mouth.     Pharmacist Choice Lancets MISC      Polyethyl Glycol-Propyl Glycol (SYSTANE) 0.4-0.3 % SOLN Place 1-2 drops into both eyes 3 (three) times daily as needed (dry/irritated eyes.).     prochlorperazine (COMPAZINE) 10 MG tablet Take 10 mg by mouth daily as needed for nausea or vomiting.     ramipril (ALTACE) 10 MG capsule Take 10 mg by mouth 2 (two) times daily.      Respiratory Therapy Supplies (CARETOUCH 2 CPAP HOSE HANGER) MISC See admin instructions.     traMADol (ULTRAM) 50 MG tablet Take 50 mg by mouth daily.     No current facility-administered medications for this visit.    SURGICAL HISTORY:  Past Surgical History:  Procedure Laterality Date   ABDOMINAL AORTAGRAM Left 09/16/2013   Procedure: ABDOMINAL AORTAGRAM;  Surgeon: Elam Dutch, MD;  Location: [facility] CATH LAB;  Service: Cardiovascular;  Laterality: Left;   CARDIAC CATHETERIZATION  01/2011   When there was segmental stenosis of the distal RCA, patent PCA stent, patent circumflex stent, and a patent small diagonal with 90% ISR.    COLONOSCOPY W/ BIOPSIES AND POLYPECTOMY     CORONARY ANGIOPLASTY  may 2002    Non-DES stenting of his circumflex, non-DES stenting of the RCA   HYDROCELE EXCISION / REPAIR  2009   by Dr Jackquline Bosch HERNIA REPAIR Bilateral    Dr Bubba Camp   LYMPH NODE BIOPSY Left 01/10/2019   Procedure: left supraclavicular LYMPH NODE BIOPSY;  Surgeon: [name], MD;  Location: [facility];  Service: Thoracic;  Laterality: Left;   MULTIPLE TOOTH EXTRACTIONS     NM [place]  01/08/2011   moderate in size and intensity area of reversible ischemia in the basal to mid inferior and septal territories. Abnormal study  stents  2008   proximal RCA, DES for progession of disease.   US ECHOCARDIOGRAPHY  01/12/2012   mild concentric LVH, borderline LA enlargement, mild to mod TR    REVIEW OF SYSTEMS:  A comprehensive review of systems was negative except for: Constitutional: positive for fatigue Neurological: positive for dizziness   PHYSICAL EXAMINATION: General appearance: alert, cooperative, and no distress Head: Normocephalic, without obvious abnormality, atraumatic Neck: no adenopathy, no JVD, supple, symmetrical, trachea midline, and thyroid not enlarged, symmetric, no tenderness/mass/nodules Lymph nodes: Cervical, supraclavicular, and axillary nodes normal. Resp: clear to auscultation bilaterally Back: symmetric, no curvature. ROM normal. No CVA tenderness. Cardio: regular rate and rhythm, S1, S2 normal, no murmur, click, rub or gallop GI: soft, non-tender; bowel sounds normal; no masses,  no organomegaly Extremities: extremities normal, atraumatic, no cyanosis or edema  ECOG PERFORMANCE STATUS: 1 - Symptomatic but completely ambulatory  Blood pressure 111/76, pulse 79, temperature (!) 96.9 F (36.1 C), temperature source Tympanic, resp. rate 19, height 5' 9.5" (1.765 m), weight 169 lb 3.2 oz (76.7 kg), SpO2 100 %.  LABORATORY DATA: Lab Results  Component Value Date   WBC 8.4 01/31/2021   HGB 9.2 (L) 01/31/2021   HCT 28.5 (L) 01/31/2021   MCV 96.3  01/31/2021   PLT 331 01/31/2021      Chemistry      Component Value Date/Time   NA 138 01/31/2021 0824   K 4.4 01/31/2021 0824   CL 104 01/31/2021 0824   CO2 23 01/31/2021 0824   BUN 23 01/31/2021 0824   CREATININE 1.38 (H) 01/31/2021 0824   CREATININE 0.94 05/16/2014 1109      Component Value Date/Time   CALCIUM 9.8 01/31/2021 0824   ALKPHOS 112 01/31/2021 0824   AST 18 01/31/2021 0824   ALT 25 01/31/2021 0824   BILITOT 0.2 (L) 01/31/2021 0824       RADIOGRAPHIC STUDIES: CT Abdomen Pelvis Wo Contrast  Result Date: 01/30/2021 CLINICAL DATA:  Primary Cancer Type: Lung Imaging Indication: Assess response to therapy Interval therapy since last imaging? Yes Initial Cancer Diagnosis Date: 01/10/2019; Established by: Biopsy-proven Detailed Pathology: Stage IV non-small cell lung cancer, adenocarcinoma. Primary Tumor location: Left upper lobe. Metastasis to right adrenal gland. Surgeries: Coronary angioplasty. Inguinal hernia repair. Chemotherapy: Yes; Ongoing?  No; Most recent administration: 03/2020 Immunotherapy?  Yes; Type: Keytruda; Ongoing? Yes Radiation therapy? Yes Date Range: 01/31/2020 - 02/17/2020; Target: Right pelvis Date Range: 01/31/2019 - 04/07/2019; Target: Left lung and right adrenal gland EXAM: CT CHEST, ABDOMEN AND PELVIS WITHOUT CONTRAST TECHNIQUE: Multidetector CT imaging of the chest, abdomen and pelvis was performed following the standard protocol without IV contrast. COMPARISON:  Most recent CT chest, abdomen and pelvis 11/01/2020. 12/06/2018 PET-CT. FINDINGS: CT CHEST FINDINGS Cardiovascular: Coronary artery calcification and aortic atherosclerotic calcification. Mediastinum/Nodes: No axillary or supraclavicular adenopathy. No mediastinal or hilar adenopathy. No pericardial fluid. Esophagus normal. Lungs/Pleura: Linear fibrosis in the medial LEFT upper lobe consistent radiation change. No suspicious nodularity in the LEFT lung. No RIGHT lung nodularity. Musculoskeletal:  No aggressive osseous lesion. CT ABDOMEN AND PELVIS FINDINGS Hepatobiliary: Several low-density lesions in liver on noncontrast exam. Lesions have simple fluid attenuation. Pancreas: Pancreas is normal. No ductal dilatation. No pancreatic inflammation. Spleen: Normal spleen Adrenals/urinary tract: Adrenal glands normal. No adrenal mass. Kidneys, ureters and bladder normal. Stomach/Bowel: Stomach, small bowel, appendix, and cecum are normal. The colon and rectosigmoid colon are normal. Vascular/Lymphatic: Abdominal aorta is normal caliber with atherosclerotic calcification. There is no retroperitoneal or periportal lymphadenopathy.  No pelvic lymphadenopathy. Reproductive: Prostate unremarkable Other: No peritoneal metastasis Musculoskeletal: Thickened trabecula in L2 vertebral bodies unchanged. No new sclerotic lesions identified. IMPRESSION: Chest Impression: 1. Postradiation change in LEFT upper lobe without evidence local recurrence. 2. No evidence of metastatic adenopathy in the thorax. Abdomen / Pelvis Impression: 1. No evidence of adrenal gland metastasis recurrence. 2. No evidence of new metastatic disease in the abdomen pelvis on noncontrast exam. 3. Stable thickened trabecula in the L2 vertebral body. Electronically Signed   By: Suzy Bouchard M.D.   On: 01/30/2021 10:16   CT Chest Wo Contrast  Result Date: 01/30/2021 CLINICAL DATA:  Primary Cancer Type: Lung Imaging Indication: Assess response to therapy Interval therapy since last imaging? Yes Initial Cancer Diagnosis Date: 01/10/2019; Established by: Biopsy-proven Detailed Pathology: Stage IV non-small cell lung cancer, adenocarcinoma. Primary Tumor location: Left upper lobe. Metastasis to right adrenal gland. Surgeries: Coronary angioplasty. Inguinal hernia repair. Chemotherapy: Yes; Ongoing?  No; Most recent administration: 03/2020 Immunotherapy?  Yes; Type: Keytruda; Ongoing? Yes Radiation therapy? Yes Date Range: 01/31/2020 - 02/17/2020; Target:  Right pelvis Date Range: 01/31/2019 - 04/07/2019; Target: Left lung and right adrenal gland EXAM: CT CHEST, ABDOMEN AND PELVIS WITHOUT CONTRAST TECHNIQUE: Multidetector CT imaging of the chest, abdomen and pelvis was performed following the standard protocol without IV contrast. COMPARISON:  Most recent CT chest, abdomen and pelvis 11/01/2020. 12/06/2018 PET-CT. FINDINGS: CT CHEST FINDINGS Cardiovascular: Coronary artery calcification and aortic atherosclerotic calcification. Mediastinum/Nodes: No axillary or supraclavicular adenopathy. No mediastinal or hilar adenopathy. No pericardial fluid. Esophagus normal. Lungs/Pleura: Linear fibrosis in the medial LEFT upper lobe consistent radiation change. No suspicious nodularity in the LEFT lung. No RIGHT lung nodularity. Musculoskeletal: No aggressive osseous lesion. CT ABDOMEN AND PELVIS FINDINGS Hepatobiliary: Several low-density lesions in liver on noncontrast exam. Lesions have simple fluid attenuation. Pancreas: Pancreas is normal. No ductal dilatation. No pancreatic inflammation. Spleen: Normal spleen Adrenals/urinary tract: Adrenal glands normal. No adrenal mass. Kidneys, ureters and bladder normal. Stomach/Bowel: Stomach, small bowel, appendix, and cecum are normal. The colon and rectosigmoid colon are normal. Vascular/Lymphatic: Abdominal aorta is normal caliber with atherosclerotic calcification. There is no retroperitoneal or periportal lymphadenopathy. No pelvic lymphadenopathy. Reproductive: Prostate unremarkable Other: No peritoneal metastasis Musculoskeletal: Thickened trabecula in L2 vertebral bodies unchanged. No new sclerotic lesions identified. IMPRESSION: Chest Impression: 1. Postradiation change in LEFT upper lobe without evidence local recurrence. 2. No evidence of metastatic adenopathy in the thorax. Abdomen / Pelvis Impression: 1. No evidence of adrenal gland metastasis recurrence. 2. No evidence of new metastatic disease in the abdomen pelvis on  noncontrast exam. 3. Stable thickened trabecula in the L2 vertebral body. Electronically Signed   By: Suzy Bouchard M.D.   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 The patient completed a course of concurrent chemoradiation to the chest with weekly carboplatin for AUC of 2 and paclitaxel 45 mg/M2 status post 7 cycles..  The patient had evidence for disease progression and he was started on systemic chemotherapy with carboplatin, Alimta and Keytruda for 4 cycles and he is currently on maintenance treatment with Alimta and Keytruda every 3 weeks status post 28 more cycles.  He is currently on treatment with single agent Keytruda because of the renal insufficiency.  The patient continues to tolerate this treatment well with no concerning adverse effects. I recommended for him to proceed with cycle #33 of his treatment today as planned. For the lack of appetite, he will continue his current treatment on Remeron. He will come back for follow-up visit in 3 weeks for evaluation before the next cycle of his treatment. He was advised to call immediately if he has any concerning symptoms in the interval. The patient voices understanding of current disease status and treatment options and is in agreement with the current care plan. All questions were answered. The patient knows to call the clinic with any problems, questions or concerns. We can certainly see the patient much sooner if necessary.   Disclaimer: This note was dictated with voice recognition software. Similar sounding words can inadvertently be transcribed and may not be corrected upon review.

## 2021-02-22 ENCOUNTER — Telehealth: Payer: Self-pay | Admitting: Internal Medicine

## 2021-02-22 NOTE — Telephone Encounter (Signed)
Left message with follow-up appointment per 10/6 los.

## 2021-03-07 NOTE — Progress Notes (Signed)
[facility] OFFICE PROGRESS NOTE  [name], MD [place] [address] [place] [address]  DIAGNOSIS:  Stage IV (T1b, N3, M1c) non-small cell lung cancer, adenocarcinoma. 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   Biomarker Findings Tumor Mutational Burden - 10 Muts/Mb Microsatellite status - MS-Stable Genomic Findings For a complete list of the genes assayed, please refer to the Appendix. ERBB2 amplification - equivocal? OIB70 W88* FANCC splice site [id number]>I SMAD4 Q289* TP53 E298* 7 Disease relevant genes with no reportable alterations: ALK, BRAF, EGFR, KRAS, MET, RET, ROS1   PDL1 expression is 30%.  PRIOR THERAPY: 1) Weekly concurrent chemoradiation with carboplatin for an AUC of 2 and paclitaxel 45 mg/m2 to the locally advanced disease in the chest. He started the first dose 01/31/2019.  Status post 7 cycles.  Last dose of chemotherapy was given on March 14, 2019. 2) palliative radiotherapy to the metastatic disease in the right adrenal gland. 3) Palliative radiotherapy to the enlarging muscular metastasis in the right hip  within the right adductor muscle, medial to the lesser trochanter of the femur under the care of Dr. [name]. First day of radiation 01/30/20  CURRENT THERAPY: Systemic chemotherapy with carboplatin for AUC of 5, Alimta 500 mg/M2 and Keytruda 200 mg IV every 3 weeks.  First dose April 20, 2019. Status post 33 cycles.  Starting from cycle #5, the patient began maintenance Alimta and Keytruda. Alimta was discontinued starting from cycle #19 due to renal insufficiency.   INTERVAL HISTORY: Navi Erber Vary 75 y.o. male returns to the clinic for a follow up visit.  The patient is feeling fairly well today without any concerning complaints. The patient is tolerating his treatment  well. He is currently on single agent immunotherapy with Keytruda. His alimta was discontinued due to renal insufficiency. Today, the patient denies any fever, chills, or night sweats. His weight is stable. He takes remeron 30 mg p.o. nightly. He drinks ensue 2 x per day or so. He denies any chest pain, shortness of breath, or hemoptysis.  He denies significant cough.  He denies any nausea, vomiting, diarrhea, or constipation.  He denies any headache or visual changes.  He denies any rashes or skin changes. The patient is here today for evaluation and repeat blood work prior to starting cycle #33    MEDICAL HISTORY: Past Medical History:  Diagnosis Date   Adenopathy    left supraclavicular lymph node   Allergies    COPD (chronic obstructive pulmonary disease) ([place])    Coronary disease    Diabetes ([place])    Enlarged prostate    GERD (gastroesophageal reflux disease)    Glaucoma    Hyperlipidemia    Hypertension    Myocardial infarction ([place])    nscl ca dx'd 12/2018   Sleep apnea    wears CPAP   Wears partial dentures    upper and lower    ALLERGIES:  has No Known Allergies.  MEDICATIONS:  Current Outpatient Medications  Medication Sig Dispense Refill   ACCU-CHEK GUIDE test strip      albuterol (VENTOLIN HFA) 108 (90 Base) MCG/ACT inhaler      amLODipine (NORVASC) 5 MG tablet TAKE 1 TABLET BY MOUTH  DAILY 90 tablet 3   apixaban (ELIQUIS) 5 MG TABS tablet Take 1 tablet (5 mg total) by mouth 2 (  two) times daily. 60 tablet 5   aspirin EC 81 MG tablet Take 81 mg by mouth daily.     atorvastatin (LIPITOR) 80 MG tablet Take 80 mg by mouth daily.     budesonide-formoterol (SYMBICORT) 160-4.5 MCG/ACT inhaler Inhale 2 puffs into the lungs 2 (two) times daily.     ezetimibe (ZETIA) 10 MG tablet Take 10 mg by mouth daily.     finasteride (PROSCAR) 5 MG tablet Take 5 mg by mouth daily.      folic acid (FOLVITE) 1 MG tablet TAKE 1 TABLET BY MOUTH  DAILY 90 tablet 3   gabapentin (NEURONTIN)  300 MG capsule Take 300 mg by mouth 2 (two) times daily.     hydrochlorothiazide (HYDRODIURIL) 25 MG tablet Take 25 mg by mouth daily.      HYDROcodone-acetaminophen (NORCO/VICODIN) 5-325 MG tablet Take 1 tablet by mouth daily as needed.     latanoprost (XALATAN) 0.005 % ophthalmic solution Place 1 drop into both eyes at bedtime.      metFORMIN (GLUCOPHAGE) 1000 MG tablet Take 1,000 mg by mouth 2 (two) times daily.      metoprolol tartrate (LOPRESSOR) 25 MG tablet Take 25 mg 2 (two) times daily by mouth.     mirtazapine (REMERON) 30 MG tablet TAKE 1 TABLET BY MOUTH AT  BEDTIME 90 tablet 3   Multiple Vitamin (MULTIVITAMIN WITH MINERALS) TABS tablet Take 1 tablet by mouth daily. Centrum Silver     ofloxacin (FLOXIN) 0.3 % OTIC solution      OVER THE COUNTER MEDICATION Take 1 tablet by mouth daily as needed. Colace     pantoprazole (PROTONIX) 40 MG tablet Take 40 mg daily by mouth.     Pharmacist Choice Lancets MISC      Polyethyl Glycol-Propyl Glycol (SYSTANE) 0.4-0.3 % SOLN Place 1-2 drops into both eyes 3 (three) times daily as needed (dry/irritated eyes.).     prochlorperazine (COMPAZINE) 10 MG tablet Take 10 mg by mouth daily as needed for nausea or vomiting.     ramipril (ALTACE) 10 MG capsule Take 10 mg by mouth 2 (two) times daily.      Respiratory Therapy Supplies (CARETOUCH 2 CPAP HOSE HANGER) MISC See admin instructions.     nitroGLYCERIN (NITROSTAT) 0.4 MG SL tablet Place 1 tablet (0.4 mg total) under the tongue every 5 (five) minutes as needed for chest pain. (Patient not taking: Reported on 03/14/2021) 25 tablet 6   No current facility-administered medications for this visit.    SURGICAL HISTORY:  Past Surgical History:  Procedure Laterality Date   ABDOMINAL AORTAGRAM Left 09/16/2013   Procedure: ABDOMINAL AORTAGRAM;  Surgeon: Elam Dutch, MD;  Location: [facility] CATH LAB;  Service: Cardiovascular;  Laterality: Left;   CARDIAC CATHETERIZATION  01/2011   When there was segmental  stenosis of the distal RCA, patent PCA stent, patent circumflex stent, and a patent small diagonal with 90% ISR.    COLONOSCOPY W/ BIOPSIES AND POLYPECTOMY     CORONARY ANGIOPLASTY  may 2002   Non-DES stenting of his circumflex, non-DES stenting of the RCA   HYDROCELE EXCISION / REPAIR  2009   by Dr Jackquline Bosch HERNIA REPAIR Bilateral    Dr Bubba Camp   LYMPH NODE BIOPSY Left 01/10/2019   Procedure: left supraclavicular LYMPH NODE BIOPSY;  Surgeon: [name], MD;  Location: [facility];  Service: Thoracic;  Laterality: Left;   MULTIPLE TOOTH EXTRACTIONS     NM [place]  01/08/2011  moderate in size and intensity area of reversible ischemia in the basal to mid inferior and septal territories. Abnormal study   stents  2008   proximal RCA, DES for progession of disease.   US ECHOCARDIOGRAPHY  01/12/2012   mild concentric LVH, borderline LA enlargement, mild to mod TR    REVIEW OF SYSTEMS:   Review of Systems  Constitutional: Negative for appetite change, chills, fatigue, fever and unexpected weight change. HENT: Negative for mouth sores, nosebleeds, sore throat and trouble swallowing.   Eyes: Negative for eye problems and icterus. Respiratory: Negative for hemoptysis, cough, shortness of breath and wheezing.   Cardiovascular: Negative for chest pain and leg swelling. Gastrointestinal: Negative for abdominal pain, constipation, diarrhea, nausea and vomiting. Genitourinary: Negative for bladder incontinence, difficulty urinating, dysuria, frequency and hematuria.   Musculoskeletal: Negative for back pain, gait problem, neck pain and neck stiffness. Skin: Positive for prior radiation burn in left shoulder. Negative for itching and rash. Neurological: Negative for dizziness, extremity weakness, gait problem, headaches, light-headedness and seizures. Hematological: Negative for adenopathy. Does not bruise/bleed easily. Psychiatric/Behavioral: Negative for confusion,  depression and sleep disturbance. The patient is not nervous/anxious.     PHYSICAL EXAMINATION:  Blood pressure 106/71, pulse 89, temperature 98.5 F (36.9 C), temperature source Tympanic, resp. rate 17, height 5' 9.5" (1.765 m), weight 168 lb 11.2 oz (76.5 kg), SpO2 99 %.  ECOG PERFORMANCE STATUS: 1  Physical Exam  Constitutional: Oriented to person, place, and time and well-developed, well-nourished, and in no distress.  HENT:  Head: Normocephalic and atraumatic.  Mouth/Throat: Oropharynx is clear and moist. No oropharyngeal exudate.  Eyes: Conjunctivae are normal. Right eye exhibits no discharge. Left eye exhibits no discharge. No scleral icterus.  Neck: Normal range of motion. Neck supple.  Cardiovascular: Normal rate, regular rhythm, normal heart sounds and intact distal pulses.   Pulmonary/Chest: Effort normal and breath sounds normal. No respiratory distress. No wheezes. No rales.  Abdominal: Soft. Bowel sounds are normal. Exhibits no distension and no mass. There is no tenderness.  Musculoskeletal: Normal range of motion. Exhibits no edema.  Lymphadenopathy:    No cervical adenopathy.  Neurological: Alert and oriented to person, place, and time. Exhibits normal muscle tone. Gait normal. Coordination normal.  Skin: Skin is warm and dry. No rash noted. Not diaphoretic. No erythema. No pallor.  Psychiatric: Mood, memory and judgment normal.  Vitals reviewed.  LABORATORY DATA: Lab Results  Component Value Date   WBC 5.4 03/14/2021   HGB 9.1 (L) 03/14/2021   HCT 28.5 (L) 03/14/2021   MCV 93.4 03/14/2021   PLT 358 03/14/2021      Chemistry      Component Value Date/Time   NA 138 03/14/2021 0930   K 4.5 03/14/2021 0930   CL 105 03/14/2021 0930   CO2 21 (L) 03/14/2021 0930   BUN 21 03/14/2021 0930   CREATININE 1.27 (H) 03/14/2021 0930   CREATININE 0.94 05/16/2014 1109      Component Value Date/Time   CALCIUM 10.0 03/14/2021 0930   ALKPHOS 109 03/14/2021 0930   AST  11 (L) 03/14/2021 0930   ALT 11 03/14/2021 0930   BILITOT 0.3 03/14/2021 0930       RADIOGRAPHIC STUDIES:  No results found.   ASSESSMENT/PLAN:  This is a very pleasant 75 year old African-American male with stage IV non-small cell lung cancer, adenocarcinoma with no actionable mutation and PD-L1 expression of 30%, presented with locally advanced disease in the chest as well as solitary metastasis  to the right adrenal gland. He has no actionable mutations.    The patient completed a course of concurrent chemoradiation to the chest with weekly carboplatin for AUC of 2 and paclitaxel 45 mg/M2 status post 7 cycles.  Last dose of chemotherapy was given on March 14, 2019. He also completed palliative radiotherapy to the right adrenal gland lesion under the care of Dr. [name].    He also completed palliative radiotherapy to the muscular metastasis in the right hip in September 2021.    He is currently undergoing systemic chemotherapy with carboplatin for an AUC of 5, Alimta 400 mg per metered squared and Keytruda 200 mg IV every 3 weeks.  He is status post 33 cycles.  He tolerated treatment well without any concerning adverse side effects except for mild fatigue. Starting from cycle #5, he has been on maintenance treatment with Alimta and Keytruda. Alimta has been discontinued due to renal insufficiency. He is currently on single agent immunotherapy with [place].  Labs were reviewed. Recommend that he proceed with cycle #34 today as scheduled.    Labs were reviewed. He is alright to treat today with his labs. The patient will proceed with cycle #34 today single agent immunotherapy is Beryle Flock today as scheduled.  We will see him back for follow-up visit in 3 weeks for evaluation before starting cycle #35.  The patient was advised to call immediately if he has any concerning symptoms in the interval. The patient voices understanding of current disease status and treatment options and is in  agreement with the current care plan. All questions were answered. The patient knows to call the clinic with any problems, questions or concerns. We can certainly see the patient much sooner if necessary      No orders of the defined types were placed in this encounter.     The total time spent in the appointment was 20-29 minutes.   Nakhia Levitan L Stefani Baik, PA-C 03/14/21

## 2021-03-14 ENCOUNTER — Other Ambulatory Visit: Payer: Self-pay

## 2021-03-14 ENCOUNTER — Inpatient Hospital Stay: Payer: Medicare Other

## 2021-03-14 ENCOUNTER — Inpatient Hospital Stay (HOSPITAL_BASED_OUTPATIENT_CLINIC_OR_DEPARTMENT_OTHER): Payer: Medicare Other | Admitting: Physician Assistant

## 2021-03-14 ENCOUNTER — Ambulatory Visit: Payer: Medicare Other

## 2021-03-14 VITALS — BP 106/71 | HR 89 | Temp 98.5°F | Resp 17 | Ht 69.5 in | Wt 168.7 lb

## 2021-03-14 DIAGNOSIS — Z5112 Encounter for antineoplastic immunotherapy: Secondary | ICD-10-CM

## 2021-03-14 DIAGNOSIS — R918 Other nonspecific abnormal finding of lung field: Secondary | ICD-10-CM

## 2021-03-14 DIAGNOSIS — C3412 Malignant neoplasm of upper lobe, left bronchus or lung: Secondary | ICD-10-CM

## 2021-03-14 DIAGNOSIS — Z79899 Other long term (current) drug therapy: Secondary | ICD-10-CM | POA: Diagnosis not present

## 2021-03-14 DIAGNOSIS — C7971 Secondary malignant neoplasm of right adrenal gland: Secondary | ICD-10-CM | POA: Diagnosis not present

## 2021-03-14 LAB — CBC WITH DIFFERENTIAL (CANCER CENTER ONLY)
Abs Immature Granulocytes: 0.02 10*3/uL (ref 0.00–0.07)
Basophils Absolute: 0 10*3/uL (ref 0.0–0.1)
Basophils Relative: 1 %
Eosinophils Absolute: 0.1 10*3/uL (ref 0.0–0.5)
Eosinophils Relative: 2 %
HCT: 28.5 % — ABNORMAL LOW (ref 39.0–52.0)
Hemoglobin: 9.1 g/dL — ABNORMAL LOW (ref 13.0–17.0)
Immature Granulocytes: 0 %
Lymphocytes Relative: 14 %
Lymphs Abs: 0.8 10*3/uL (ref 0.7–4.0)
MCH: 29.8 pg (ref 26.0–34.0)
MCHC: 31.9 g/dL (ref 30.0–36.0)
MCV: 93.4 fL (ref 80.0–100.0)
Monocytes Absolute: 0.7 10*3/uL (ref 0.1–1.0)
Monocytes Relative: 13 %
Neutro Abs: 3.8 10*3/uL (ref 1.7–7.7)
Neutrophils Relative %: 70 %
Platelet Count: 358 10*3/uL (ref 150–400)
RBC: 3.05 MIL/uL — ABNORMAL LOW (ref 4.22–5.81)
RDW: 14.6 % (ref 11.5–15.5)
WBC Count: 5.4 10*3/uL (ref 4.0–10.5)
nRBC: 0 % (ref 0.0–0.2)

## 2021-03-14 LAB — CMP (CANCER CENTER ONLY)
ALT: 11 U/L (ref 0–44)
AST: 11 U/L — ABNORMAL LOW (ref 15–41)
Albumin: 2.5 g/dL — ABNORMAL LOW (ref 3.5–5.0)
Alkaline Phosphatase: 109 U/L (ref 38–126)
Anion gap: 12 (ref 5–15)
BUN: 21 mg/dL (ref 8–23)
CO2: 21 mmol/L — ABNORMAL LOW (ref 22–32)
Calcium: 10 mg/dL (ref 8.9–10.3)
Chloride: 105 mmol/L (ref 98–111)
Creatinine: 1.27 mg/dL — ABNORMAL HIGH (ref 0.61–1.24)
GFR, Estimated: 59 mL/min — ABNORMAL LOW (ref >60)
Glucose, Bld: 111 mg/dL — ABNORMAL HIGH (ref 70–99)
Potassium: 4.5 mmol/L (ref 3.5–5.1)
Sodium: 138 mmol/L (ref 135–145)
Total Bilirubin: 0.3 mg/dL (ref 0.3–1.2)
Total Protein: 7.4 g/dL (ref 6.5–8.1)

## 2021-03-14 LAB — TSH: TSH: 0.976 u[IU]/mL (ref 0.320–4.118)

## 2021-03-14 MED ORDER — SODIUM CHLORIDE 0.9 % IV SOLN: Freq: Once | INTRAVENOUS | Status: AC

## 2021-03-14 MED ORDER — SODIUM CHLORIDE 0.9 % IV SOLN
200.0000 mg | Freq: Once | INTRAVENOUS | Status: AC
  Administered 2021-03-14: 200 mg via INTRAVENOUS
  Filled 2021-03-14: qty 8

## 2021-03-14 NOTE — Patient Instructions (Signed)
[name]  Discharge Instructions: Thank you for choosing [facility] to provide your oncology and hematology care.   If you have a lab appointment with the [facility], please go directly to the [facility] and check in at the registration area.   Wear comfortable clothing and clothing appropriate for easy access to any Portacath or PICC line.   We strive to give you quality time with your provider. You may need to reschedule your appointment if you arrive late (15 or more minutes).  Arriving late affects you and other patients whose appointments are after yours.  Also, if you miss three or more appointments without notifying the office, you may be dismissed from the clinic at the provider's discretion.      For prescription refill requests, have your pharmacy contact our office and allow 72 hours for refills to be completed.    Today you received the following chemotherapy and/or immunotherapy agents Trevor Bailey      To help prevent nausea and vomiting after your treatment, we encourage you to take your nausea medication as directed.  BELOW ARE SYMPTOMS THAT SHOULD BE REPORTED IMMEDIATELY: *FEVER GREATER THAN 100.4 F (38 C) OR HIGHER *CHILLS OR SWEATING *NAUSEA AND VOMITING THAT IS NOT CONTROLLED WITH YOUR NAUSEA MEDICATION *UNUSUAL SHORTNESS OF BREATH *UNUSUAL BRUISING OR BLEEDING *URINARY PROBLEMS (pain or burning when urinating, or frequent urination) *BOWEL PROBLEMS (unusual diarrhea, constipation, pain near the anus) TENDERNESS IN MOUTH AND THROAT WITH OR WITHOUT PRESENCE OF ULCERS (sore throat, sores in mouth, or a toothache) UNUSUAL RASH, SWELLING OR PAIN  UNUSUAL VAGINAL DISCHARGE OR ITCHING   Items with * indicate a potential emergency and should be followed up as soon as possible or go to the Emergency Department if any problems should occur.  Please show the CHEMOTHERAPY ALERT CARD or IMMUNOTHERAPY ALERT CARD at check-in to  the Emergency Department and triage nurse.  Should you have questions after your visit or need to cancel or reschedule your appointment, please contact [facility]  Dept: [phone]  and follow the prompts.  Office hours are 8:00 a.m. to 4:30 p.m. Monday - Friday. Please note that voicemails left after 4:00 p.m. may not be returned until the following business day.  We are closed weekends and major holidays. You have access to a nurse at all times for urgent questions. Please call the main number to the clinic Dept: [phone] and follow the prompts.   For any non-urgent questions, you may also contact your provider using MyChart. We now offer e-Visits for anyone 69 and older to request care online for non-urgent symptoms. For details visit [email or web address].   Also download the MyChart app! Go to the app store, search "MyChart", open the app, select , and log in with your MyChart username and password.  Due to Covid, a mask is required upon entering the hospital/clinic. If you do not have a mask, one will be given to you upon arrival. For doctor visits, patients may have 1 support person aged 53 or older with them. For treatment visits, patients cannot have anyone with them due to current Covid guidelines and our immunocompromised population.

## 2021-04-03 ENCOUNTER — Other Ambulatory Visit: Payer: Self-pay | Admitting: Physician Assistant

## 2021-04-03 DIAGNOSIS — C3412 Malignant neoplasm of upper lobe, left bronchus or lung: Secondary | ICD-10-CM

## 2021-04-04 ENCOUNTER — Encounter: Payer: Self-pay | Admitting: Internal Medicine

## 2021-04-04 ENCOUNTER — Inpatient Hospital Stay: Payer: Medicare Other

## 2021-04-04 ENCOUNTER — Inpatient Hospital Stay (HOSPITAL_BASED_OUTPATIENT_CLINIC_OR_DEPARTMENT_OTHER): Payer: Medicare Other | Admitting: Internal Medicine

## 2021-04-04 ENCOUNTER — Other Ambulatory Visit: Payer: Self-pay

## 2021-04-04 ENCOUNTER — Inpatient Hospital Stay: Payer: Medicare Other | Attending: Internal Medicine

## 2021-04-04 VITALS — BP 124/77 | HR 87 | Temp 97.5°F | Resp 16 | Ht 69.5 in | Wt 169.8 lb

## 2021-04-04 DIAGNOSIS — C3490 Malignant neoplasm of unspecified part of unspecified bronchus or lung: Secondary | ICD-10-CM

## 2021-04-04 DIAGNOSIS — C3412 Malignant neoplasm of upper lobe, left bronchus or lung: Secondary | ICD-10-CM | POA: Diagnosis not present

## 2021-04-04 DIAGNOSIS — Z5112 Encounter for antineoplastic immunotherapy: Secondary | ICD-10-CM | POA: Insufficient documentation

## 2021-04-04 DIAGNOSIS — C7971 Secondary malignant neoplasm of right adrenal gland: Secondary | ICD-10-CM | POA: Insufficient documentation

## 2021-04-04 DIAGNOSIS — Z79899 Other long term (current) drug therapy: Secondary | ICD-10-CM | POA: Diagnosis not present

## 2021-04-04 DIAGNOSIS — G4733 Obstructive sleep apnea (adult) (pediatric): Secondary | ICD-10-CM | POA: Diagnosis not present

## 2021-04-04 LAB — CMP (CANCER CENTER ONLY)
ALT: 11 U/L (ref 0–44)
AST: 12 U/L — ABNORMAL LOW (ref 15–41)
Albumin: 2.5 g/dL — ABNORMAL LOW (ref 3.5–5.0)
Alkaline Phosphatase: 120 U/L (ref 38–126)
Anion gap: 12 (ref 5–15)
BUN: 21 mg/dL (ref 8–23)
CO2: 24 mmol/L (ref 22–32)
Calcium: 10 mg/dL (ref 8.9–10.3)
Chloride: 104 mmol/L (ref 98–111)
Creatinine: 1.22 mg/dL (ref 0.61–1.24)
GFR, Estimated: >60 mL/min (ref >60)
Glucose, Bld: 96 mg/dL (ref 70–99)
Potassium: 4.5 mmol/L (ref 3.5–5.1)
Sodium: 140 mmol/L (ref 135–145)
Total Bilirubin: <0.2 mg/dL — ABNORMAL LOW (ref 0.3–1.2)
Total Protein: 7.6 g/dL (ref 6.5–8.1)

## 2021-04-04 LAB — CBC WITH DIFFERENTIAL (CANCER CENTER ONLY)
Abs Immature Granulocytes: 0.02 10*3/uL (ref 0.00–0.07)
Basophils Absolute: 0.1 10*3/uL (ref 0.0–0.1)
Basophils Relative: 1 %
Eosinophils Absolute: 0.1 10*3/uL (ref 0.0–0.5)
Eosinophils Relative: 2 %
HCT: 29.8 % — ABNORMAL LOW (ref 39.0–52.0)
Hemoglobin: 9.5 g/dL — ABNORMAL LOW (ref 13.0–17.0)
Immature Granulocytes: 0 %
Lymphocytes Relative: 22 %
Lymphs Abs: 1.1 10*3/uL (ref 0.7–4.0)
MCH: 29.6 pg (ref 26.0–34.0)
MCHC: 31.9 g/dL (ref 30.0–36.0)
MCV: 92.8 fL (ref 80.0–100.0)
Monocytes Absolute: 0.9 10*3/uL (ref 0.1–1.0)
Monocytes Relative: 16 %
Neutro Abs: 3.1 10*3/uL (ref 1.7–7.7)
Neutrophils Relative %: 59 %
Platelet Count: 394 10*3/uL (ref 150–400)
RBC: 3.21 MIL/uL — ABNORMAL LOW (ref 4.22–5.81)
RDW: 15.2 % (ref 11.5–15.5)
WBC Count: 5.3 10*3/uL (ref 4.0–10.5)
nRBC: 0 % (ref 0.0–0.2)

## 2021-04-04 LAB — TSH: TSH: 1.3 u[IU]/mL (ref 0.320–4.118)

## 2021-04-04 MED ORDER — SODIUM CHLORIDE 0.9 % IV SOLN: Freq: Once | INTRAVENOUS | Status: AC

## 2021-04-04 MED ORDER — SODIUM CHLORIDE 0.9 % IV SOLN
200.0000 mg | Freq: Once | INTRAVENOUS | Status: AC
  Administered 2021-04-04: 11:00:00 200 mg via INTRAVENOUS
  Filled 2021-04-04: qty 8

## 2021-04-04 NOTE — Progress Notes (Signed)
[facility] [phone]   [phone]  OFFICE PROGRESS NOTE  [name], MD [address] [place] [address]  DIAGNOSIS:  Stage IV (T1b, N3, M1c) non-small cell lung cancer, adenocarcinoma. 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  Biomarker Findings Tumor Mutational Burden - 10 Muts/Mb Microsatellite status - MS-Stable Genomic Findings For a complete list of the genes assayed, please refer to the Appendix. ERBB2 amplification - equivocal? ATF57 D22* FANCC splice site 025+4Y>H SMAD4 Q289* TP53 E298* 7 Disease relevant genes with no reportable alterations: ALK, BRAF, EGFR, KRAS, MET, RET, ROS1    PDL1 expression is 30%.  PRIOR THERAPY: 1) Weekly concurrent chemoradiation with carboplatin for an AUC of 2 and paclitaxel 45 mg/m2 to the locally advanced disease in the chest. He started the first dose 01/31/2019.  Status post 7 cycles.  Last dose of chemotherapy was given on March 14, 2019. 2) palliative radiotherapy to the metastatic disease in the right adrenal gland. 3) palliative radiotherapy to the metastatic deposit in the right obturator externus and abductor magnus under the care of Dr. [name].   CURRENT THERAPY: Systemic chemotherapy with carboplatin for AUC of 5, Alimta 500 mg/M2 and Keytruda 200 mg IV every 3 weeks.  First dose April 20, 2019. Status post 34 cycles.   Starting from cycle #5, the patient is on maintenance treatment with Alimta and Keytruda every 3 weeks.  Recently the patient has been on treatment with single agent Keytruda secondary to renal insufficiency.  INTERVAL HISTORY: Trevor Bailey 75 y.o. male returns to the clinic today for follow-up visit.  The patient is feeling fine today with no concerning complaints except for  occasional aching pain on the right leg.  He denied having any current chest pain, shortness of breath, cough or hemoptysis.  He denied having any fever or chills.  He has no nausea, vomiting, diarrhea or constipation.  He has no headache or visual changes.  He denied having any significant weight loss or night sweats.  He is here today for evaluation before starting cycle #35 of his treatment.   MEDICAL HISTORY: Past Medical History:  Diagnosis Date   Adenopathy    left supraclavicular lymph node   Allergies    COPD (chronic obstructive pulmonary disease) ([place])    Coronary disease    Diabetes ([place])    Enlarged prostate    GERD (gastroesophageal reflux disease)    Glaucoma    Hyperlipidemia    Hypertension    Myocardial infarction ([place])    nscl ca dx'd 12/2018   Sleep apnea    wears CPAP   Wears partial dentures    upper and lower    ALLERGIES:  has No Known Allergies.  MEDICATIONS:  Current Outpatient Medications  Medication Sig Dispense Refill   ACCU-CHEK GUIDE test strip      albuterol (VENTOLIN HFA) 108 (90 Base) MCG/ACT inhaler      amLODipine (NORVASC) 5 MG tablet TAKE 1 TABLET BY MOUTH  DAILY 90 tablet 3   apixaban (ELIQUIS) 5 MG TABS tablet Take 1 tablet (5 mg total) by mouth 2 (two) times daily. 60 tablet 5   aspirin EC 81 MG tablet Take 81 mg by mouth daily.     atorvastatin (LIPITOR) 80 MG tablet Take 80 mg by  mouth daily.     budesonide-formoterol (SYMBICORT) 160-4.5 MCG/ACT inhaler Inhale 2 puffs into the lungs 2 (two) times daily.     ezetimibe (ZETIA) 10 MG tablet Take 10 mg by mouth daily.     finasteride (PROSCAR) 5 MG tablet Take 5 mg by mouth daily.      folic acid (FOLVITE) 1 MG tablet TAKE 1 TABLET BY MOUTH  DAILY 90 tablet 3   gabapentin (NEURONTIN) 300 MG capsule Take 300 mg by mouth 2 (two) times daily.     hydrochlorothiazide (HYDRODIURIL) 25 MG tablet Take 25 mg by mouth daily.      HYDROcodone-acetaminophen (NORCO/VICODIN) 5-325 MG tablet Take 1  tablet by mouth daily as needed.     latanoprost (XALATAN) 0.005 % ophthalmic solution Place 1 drop into both eyes at bedtime.      metFORMIN (GLUCOPHAGE) 1000 MG tablet Take 1,000 mg by mouth 2 (two) times daily.      metoprolol tartrate (LOPRESSOR) 25 MG tablet Take 25 mg 2 (two) times daily by mouth.     mirtazapine (REMERON) 30 MG tablet TAKE 1 TABLET BY MOUTH AT  BEDTIME 90 tablet 3   Multiple Vitamin (MULTIVITAMIN WITH MINERALS) TABS tablet Take 1 tablet by mouth daily. Centrum Silver     nitroGLYCERIN (NITROSTAT) 0.4 MG SL tablet Place 1 tablet (0.4 mg total) under the tongue every 5 (five) minutes as needed for chest pain. (Patient not taking: Reported on 03/14/2021) 25 tablet 6   ofloxacin (FLOXIN) 0.3 % OTIC solution      OVER THE COUNTER MEDICATION Take 1 tablet by mouth daily as needed. Colace     pantoprazole (PROTONIX) 40 MG tablet Take 40 mg daily by mouth.     Pharmacist Choice Lancets MISC      Polyethyl Glycol-Propyl Glycol (SYSTANE) 0.4-0.3 % SOLN Place 1-2 drops into both eyes 3 (three) times daily as needed (dry/irritated eyes.).     prochlorperazine (COMPAZINE) 10 MG tablet Take 10 mg by mouth daily as needed for nausea or vomiting.     ramipril (ALTACE) 10 MG capsule Take 10 mg by mouth 2 (two) times daily.      Respiratory Therapy Supplies (CARETOUCH 2 CPAP HOSE HANGER) MISC See admin instructions.     No current facility-administered medications for this visit.    SURGICAL HISTORY:  Past Surgical History:  Procedure Laterality Date   ABDOMINAL AORTAGRAM Left 09/16/2013   Procedure: ABDOMINAL AORTAGRAM;  Surgeon: Elam Dutch, MD;  Location: [facility] CATH LAB;  Service: Cardiovascular;  Laterality: Left;   CARDIAC CATHETERIZATION  01/2011   When there was segmental stenosis of the distal RCA, patent PCA stent, patent circumflex stent, and a patent small diagonal with 90% ISR.    COLONOSCOPY W/ BIOPSIES AND POLYPECTOMY     CORONARY ANGIOPLASTY  may 2002   Non-DES  stenting of his circumflex, non-DES stenting of the RCA   HYDROCELE EXCISION / REPAIR  2009   by Dr Jackquline Bosch HERNIA REPAIR Bilateral    Dr Bubba Camp   LYMPH NODE BIOPSY Left 01/10/2019   Procedure: left supraclavicular LYMPH NODE BIOPSY;  Surgeon: [name], MD;  Location: [facility];  Service: Thoracic;  Laterality: Left;   MULTIPLE TOOTH EXTRACTIONS     NM [place]  01/08/2011   moderate in size and intensity area of reversible ischemia in the basal to mid inferior and septal territories. Abnormal study   stents  2008   proximal RCA, DES  for progession of disease.   US ECHOCARDIOGRAPHY  01/12/2012   mild concentric LVH, borderline LA enlargement, mild to mod TR    REVIEW OF SYSTEMS:  A comprehensive review of systems was negative except for: Constitutional: positive for fatigue Musculoskeletal: positive for arthralgias   PHYSICAL EXAMINATION: General appearance: alert, cooperative, fatigued, and no distress Head: Normocephalic, without obvious abnormality, atraumatic Neck: no adenopathy, no JVD, supple, symmetrical, trachea midline, and thyroid not enlarged, symmetric, no tenderness/mass/nodules Lymph nodes: Cervical, supraclavicular, and axillary nodes normal. Resp: clear to auscultation bilaterally Back: symmetric, no curvature. ROM normal. No CVA tenderness. Cardio: regular rate and rhythm, S1, S2 normal, no murmur, click, rub or gallop GI: soft, non-tender; bowel sounds normal; no masses,  no organomegaly Extremities: extremities normal, atraumatic, no cyanosis or edema  ECOG PERFORMANCE STATUS: 1 - Symptomatic but completely ambulatory  Blood pressure 124/77, pulse 87, temperature (!) 97.5 F (36.4 C), temperature source Tympanic, resp. rate 16, height 5' 9.5" (1.765 m), weight 169 lb 12.8 oz (77 kg), SpO2 100 %.  LABORATORY DATA: Lab Results  Component Value Date   WBC 5.3 04/04/2021   HGB 9.5 (L) 04/04/2021   HCT 29.8 (L) 04/04/2021   MCV 92.8  04/04/2021   PLT 394 04/04/2021      Chemistry      Component Value Date/Time   NA 138 03/14/2021 0930   K 4.5 03/14/2021 0930   CL 105 03/14/2021 0930   CO2 21 (L) 03/14/2021 0930   BUN 21 03/14/2021 0930   CREATININE 1.27 (H) 03/14/2021 0930   CREATININE 0.94 05/16/2014 1109      Component Value Date/Time   CALCIUM 10.0 03/14/2021 0930   ALKPHOS 109 03/14/2021 0930   AST 11 (L) 03/14/2021 0930   ALT 11 03/14/2021 0930   BILITOT 0.3 03/14/2021 0930       RADIOGRAPHIC STUDIES: No results found.  ASSESSMENT AND PLAN: This is a very pleasant 75 years old African-American male with stage IV non-small cell lung cancer, adenocarcinoma with no actionable mutation and PD-L1 expression of 30%, presented with locally advanced disease in the chest as well as solitary metastasis to the right adrenal gland. The patient completed a course of concurrent chemoradiation to the chest with weekly carboplatin for AUC of 2 and paclitaxel 45 mg/M2 status post 7 cycles..  The patient had evidence for disease progression and he was started on systemic chemotherapy with carboplatin, Alimta and Keytruda for 4 cycles and he is currently on maintenance treatment with Alimta and Keytruda every 3 weeks status post 30 more cycles.  He is currently on treatment with single agent Keytruda because of the renal insufficiency. The patient continues to tolerate his treatment with single agent Keytruda fairly well. I recommended for him to proceed with cycle #35 of Keytruda today as planned. I will see him back for follow-up visit in 3 weeks for evaluation with repeat CT scan of the chest, abdomen and pelvis for restaging of his disease.  After the scan, I will discuss with the patient discontinuation of the treatment with immunotherapy versus proceeding with additional treatment if needed. For the lack of appetite, he will continue his current treatment on Remeron. The patient was advised to call immediately if he  has any other concerning symptoms in the interval. The patient voices understanding of current disease status and treatment options and is in agreement with the current care plan. All questions were answered. The patient knows to call the clinic with any problems, questions or  concerns. We can certainly see the patient much sooner if necessary.   Disclaimer: This note was dictated with voice recognition software. Similar sounding words can inadvertently be transcribed and may not be corrected upon review.

## 2021-04-04 NOTE — Patient Instructions (Signed)
[facility] MEDICAL [facility]COLOGY  Discharge Instructions: ?Thank you for choosing [facility] to provide your oncology and hematology care.  ? ?If you have a lab appointment with the Cancer Center, please go directly to the Cancer Center and check in at the registration area. ?  ?Wear comfortable clothing and clothing appropriate for easy access to any Portacath or PICC line.  ? ?We strive to give you quality time with your provider. You may need to reschedule your appointment if you arrive late (15 or more minutes).  Arriving late affects you and other patients whose appointments are after yours.  Also, if you miss three or more appointments without notifying the office, you may be dismissed from the clinic at the provider?s discretion.    ?  ?For prescription refill requests, have your pharmacy contact our office and allow 72 hours for refills to be completed.   ? ?Today you received the following chemotherapy and/or immunotherapy agents: Keytruda ?  ?To help prevent nausea and vomiting after your treatment, we encourage you to take your nausea medication as directed. ? ?BELOW ARE SYMPTOMS THAT SHOULD BE REPORTED IMMEDIATELY: ?*FEVER GREATER THAN 100.4 F (38 ?C) OR HIGHER ?*CHILLS OR SWEATING ?*NAUSEA AND VOMITING THAT IS NOT CONTROLLED WITH YOUR NAUSEA MEDICATION ?*UNUSUAL SHORTNESS OF BREATH ?*UNUSUAL BRUISING OR BLEEDING ?*URINARY PROBLEMS (pain or burning when urinating, or frequent urination) ?*BOWEL PROBLEMS (unusual diarrhea, constipation, pain near the anus) ?TENDERNESS IN MOUTH AND THROAT WITH OR WITHOUT PRESENCE OF ULCERS (sore throat, sores in mouth, or a toothache) ?UNUSUAL RASH, SWELLING OR PAIN  ?UNUSUAL VAGINAL DISCHARGE OR ITCHING  ? ?Items with * indicate a potential emergency and should be followed up as soon as possible or go to the Emergency Department if any problems should occur. ? ?Please show the CHEMOTHERAPY ALERT CARD or IMMUNOTHERAPY ALERT CARD at check-in to the  Emergency Department and triage nurse. ? ?Should you have questions after your visit or need to cancel or reschedule your appointment, please contact [facility]  Dept: [phone]  and follow the prompts.  Office hours are 8:00 a.m. to 4:30 p.m. Monday - Friday. Please note that voicemails left after 4:00 p.m. may not be returned until the following business day.  We are closed weekends and major holidays. You have access to a nurse at all times for urgent questions. Please call the main number to the clinic Dept: [phone] and follow the prompts. ? ? ?For any non-urgent questions, you may also contact your provider using MyChart. We now offer e-Visits for anyone 18 and older to request care online for non-urgent symptoms. For details visit [email or web address]. ?  ?Also download the MyChart app! Go to the app store, search "MyChart", open the app, select [facility], and log in with your MyChart username and password. ? ?Due to Covid, a mask is required upon entering the hospital/clinic. If you do not have a mask, one will be given to you upon arrival. For doctor visits, patients may have 1 support person aged 18 or older with them. For treatment visits, patients cannot have anyone with them due to current Covid guidelines and our immunocompromised population.  ? ?

## 2021-04-05 DIAGNOSIS — M25512 Pain in left shoulder: Secondary | ICD-10-CM | POA: Diagnosis not present

## 2021-04-10 ENCOUNTER — Encounter: Payer: Self-pay | Admitting: Cardiovascular Disease

## 2021-04-10 ENCOUNTER — Ambulatory Visit (INDEPENDENT_AMBULATORY_CARE_PROVIDER_SITE_OTHER): Payer: Medicare Other | Admitting: Cardiovascular Disease

## 2021-04-10 ENCOUNTER — Other Ambulatory Visit: Payer: Self-pay

## 2021-04-10 VITALS — BP 100/62 | HR 87 | Ht 69.5 in | Wt 169.0 lb

## 2021-04-10 DIAGNOSIS — I2510 Atherosclerotic heart disease of native coronary artery without angina pectoris: Secondary | ICD-10-CM | POA: Diagnosis not present

## 2021-04-10 DIAGNOSIS — Z9989 Dependence on other enabling machines and devices: Secondary | ICD-10-CM | POA: Diagnosis not present

## 2021-04-10 DIAGNOSIS — I10 Essential (primary) hypertension: Secondary | ICD-10-CM | POA: Diagnosis not present

## 2021-04-10 DIAGNOSIS — G4733 Obstructive sleep apnea (adult) (pediatric): Secondary | ICD-10-CM

## 2021-04-10 DIAGNOSIS — E1159 Type 2 diabetes mellitus with other circulatory complications: Secondary | ICD-10-CM

## 2021-04-10 DIAGNOSIS — C3490 Malignant neoplasm of unspecified part of unspecified bronchus or lung: Secondary | ICD-10-CM

## 2021-04-10 DIAGNOSIS — E785 Hyperlipidemia, unspecified: Secondary | ICD-10-CM | POA: Diagnosis not present

## 2021-04-10 NOTE — Patient Instructions (Signed)
Medication Instructions:  Take eliquis 5 mg twice a day (samples given).  *If you need a refill on your cardiac medications before your next appointment, please call your pharmacy*   Lab Work: None   Testing/Procedures: None    Follow-Up: At [facility], you and your health needs are our priority.  As part of our continuing mission to provide you with exceptional heart care, we have created designated Provider Care Teams.  These Care Teams include your primary Cardiologist (physician) and Advanced Practice Providers (APPs -  Physician Assistants and Nurse Practitioners) who all work together to provide you with the care you need, when you need it.  We recommend signing up for the patient portal called "MyChart".  Sign up information is provided on this After Visit Summary.  MyChart is used to connect with patients for Virtual Visits (Telemedicine).  Patients are able to view lab/test results, encounter notes, upcoming appointments, etc.  Non-urgent messages can be sent to your provider as well.   To learn more about what you can do with MyChart, go to [email or web address].    Your next appointment:   6 month(s)  The format for your next appointment:   In Person  Provider:   Almyra Deforest, PA-C    Then, Shelva Majestic, MD will plan to see you again in 12 month(s).

## 2021-04-10 NOTE — Progress Notes (Signed)
Patient ID: Trevor Bailey, male   DOB: 1946/01/07, 75 y.o.   MRN: [id number]    Primary M.D.: Dr. Delfina Redwood  HPI:  [name] is a 75 year old African American male who is a former patient of Dr. Rollene Fare.  He presents for a 76  month evaluation.  [name] has a history of hypertension, type 2 diabetes mellitus, CAD, and hyperlipidemia. In May 2000 he underwent mid circumflex and proximal diagonal PTCA/non DES stenting. In 2002, he underwent non-DES stenting of the circumflex coronary artery as well as mid RCA.  In 2008, he underwent DES stenting of the proximal RCA. His last catheterization in September 2012 showed segmental disease of the distal RCA with a patent RCA stent, a patent circumflex stent and the small diagonal vessel with 90% in-stent restenosis that was felt to be a very small vessel and medical therapy was recommended. Subsequently he has done well without recurrent anginal symptoms. An echo Doppler study in August 2013 showed mild concentric LVH with grade 1 diastolic dysfunction. There was mild mitral annular calcification with mild MR, mild to moderate TR. His last nuclear perfusion study in 2012 was mildly abnormal and led to the cardiac catheterization.  The patient has obstructive sleep apnea and has been on CPAP therapy for >7 years. He had smoked for approximately 50 years. He sees Dr. Delfina Redwood for primary care.   He sees Dr.Charles Fields  for PVD and in February 2011 underwent lower extremity angiographic study  and was found to have 70% stenosis of the right common femoral artery, and multilevel areas of 30-50% stenoses of the superficial femoral artery bilaterally, left greater than right, and 2 vessel runoff with peroneal and posterior tibial artery bilaterally.  He underwent physical therapy and has noticed significant improvement in his prior claudication.  I saw him in November 2018 at which time he continued to do well.  He denied any episodes of chest pain or  shortness of breath.  He keeps active, caring for 4 grandchildren and 3 great-grandchildren.  He walks at least 3 days per week.  He was unaware any change in claudication symptoms.  He has not seen Dr. Oneida Alar in over a year.  He has continued to be on ramipril 10 mg, metoprolol, tartrate 25 mg twice a day, and HCTZ 25 mg daily for hypertension.  He is on aspirin and Plavix for dual antiplatelet therapy.  He denies bleeding.  He is on combination therapy with atorvastatin 40 mg and Zetia 10 mg.  He takes real lip to for his lung disease.  He is diabetic on metformin 1000 mg twice a day.  He takes pantoprazole for GERD, which is controlled.  He admits to 100% compliance with CPAP.  He had blood work done by Dr. Delfina Redwood in August 2018.  Total cholesterol was 160, HDL 62, LDL 77, triglycerides 109.  Hemoglobin A1c was 6.7.  Had normal LFTs.  Renal function was normal with a creatinine of 0.95.    I last saw him in December 2019.  At that time his claudication was better and he was walking 2-3 times per week for at least 2 miles at a time in addition to exercising on a bicycle.  He denied any chest pain or shortness of breath.  He continued to use CPAP.   He was evaluated in a telemedicine visit in June 2020 and at that time was without anginal symptoms.  He was on Lipitor and Zetia for hyperlipidemia.  Blood pressure  was stable   I last saw him in a telemedicine evaluation on January 17, 2020.  Since his prior evaluation he was diagnosed  with stage IV (T1b, N3, M1C) non-small cell lung cancer, after he  presented with a left upper lobe lung nodule in addition to left supraclavicular lymphadenopathy and had metastases to his right adrenal gland and questionable muscular lesion along the right inferior pubic ramus.  He is followed by Dr. Earlie Server.  He is undergoing chemoradiation.  He gets a CT scan every 3 months.  He recently was felt to have disease progression and he is now on maintenance Alimta and Keytruda.   He denied any anginal symptoms or shortness of breath.    Presently, he tells me he completed 33 treatments with Keytruda and also has undergone radiation therapy.  He feels well.  He specifically denies any anginal symptoms.  He continues to be on CPAP therapy and a download was obtained from October 24 through April 09, 2021.  Compliance was suboptimal only 63% of usage days.  However on days used he continued therapy for 7 hours and 50 minutes.  His AHI was excellent at 0.6 events per hour and his auto device apparently is set at a range of 4 to 20 cm of water with a 95th percentile average at 10.1 and maximum average at 11.9.  He presents for evaluation   Past Medical History:  Diagnosis Date   Adenopathy    left supraclavicular lymph node   Allergies    COPD (chronic obstructive pulmonary disease) ([place])    Coronary disease    Diabetes ([place])    Enlarged prostate    GERD (gastroesophageal reflux disease)    Glaucoma    Hyperlipidemia    Hypertension    Myocardial infarction ([place])    nscl ca dx'd 12/2018   Sleep apnea    wears CPAP   Wears partial dentures    upper and lower    Past Surgical History:  Procedure Laterality Date   ABDOMINAL AORTAGRAM Left 09/16/2013   Procedure: ABDOMINAL Maxcine Ham;  Surgeon: Elam Dutch, MD;  Location: [facility] CATH LAB;  Service: Cardiovascular;  Laterality: Left;   CARDIAC CATHETERIZATION  01/2011   When there was segmental stenosis of the distal RCA, patent PCA stent, patent circumflex stent, and a patent small diagonal with 90% ISR.    COLONOSCOPY W/ BIOPSIES AND POLYPECTOMY     CORONARY ANGIOPLASTY  may 2002   Non-DES stenting of his circumflex, non-DES stenting of the RCA   HYDROCELE EXCISION / REPAIR  2009   by Dr Jackquline Bosch HERNIA REPAIR Bilateral    Dr Bubba Camp   LYMPH NODE BIOPSY Left 01/10/2019   Procedure: left supraclavicular LYMPH NODE BIOPSY;  Surgeon: [name], MD;  Location: [facility];  Service: Thoracic;   Laterality: Left;   MULTIPLE TOOTH EXTRACTIONS     NM [place]  01/08/2011   moderate in size and intensity area of reversible ischemia in the basal to mid inferior and septal territories. Abnormal study   stents  2008   proximal RCA, DES for progession of disease.   US ECHOCARDIOGRAPHY  01/12/2012   mild concentric LVH, borderline LA enlargement, mild to mod TR    Not on File  Current Outpatient Medications  Medication Sig Dispense Refill   ACCU-CHEK GUIDE test strip      albuterol (VENTOLIN HFA) 108 (90 Base) MCG/ACT inhaler      amLODipine (NORVASC) 5  MG tablet TAKE 1 TABLET BY MOUTH  DAILY 90 tablet 3   apixaban (ELIQUIS) 5 MG TABS tablet Take 1 tablet (5 mg total) by mouth 2 (two) times daily. 60 tablet 5   aspirin EC 81 MG tablet Take 81 mg by mouth daily.     atorvastatin (LIPITOR) 80 MG tablet Take 80 mg by mouth daily.     budesonide-formoterol (SYMBICORT) 160-4.5 MCG/ACT inhaler Inhale 2 puffs into the lungs 2 (two) times daily.     ezetimibe (ZETIA) 10 MG tablet Take 10 mg by mouth daily.     finasteride (PROSCAR) 5 MG tablet Take 5 mg by mouth daily.      folic acid (FOLVITE) 1 MG tablet TAKE 1 TABLET BY MOUTH  DAILY 90 tablet 3   gabapentin (NEURONTIN) 300 MG capsule Take 300 mg by mouth 2 (two) times daily.     hydrochlorothiazide (HYDRODIURIL) 25 MG tablet Take 25 mg by mouth daily.      HYDROcodone-acetaminophen (NORCO/VICODIN) 5-325 MG tablet Take 1 tablet by mouth daily as needed.     latanoprost (XALATAN) 0.005 % ophthalmic solution Place 1 drop into both eyes at bedtime.      metFORMIN (GLUCOPHAGE) 1000 MG tablet Take 1,000 mg by mouth 2 (two) times daily.      metoprolol tartrate (LOPRESSOR) 25 MG tablet Take 25 mg 2 (two) times daily by mouth.     mirtazapine (REMERON) 30 MG tablet TAKE 1 TABLET BY MOUTH AT  BEDTIME 90 tablet 3   Multiple Vitamin (MULTIVITAMIN WITH MINERALS) TABS tablet Take 1 tablet by mouth daily. Centrum Silver     nitroGLYCERIN  (NITROSTAT) 0.4 MG SL tablet Place 1 tablet (0.4 mg total) under the tongue every 5 (five) minutes as needed for chest pain. 25 tablet 6   ofloxacin (FLOXIN) 0.3 % OTIC solution      OVER THE COUNTER MEDICATION Take 1 tablet by mouth daily as needed. Colace     pantoprazole (PROTONIX) 40 MG tablet Take 40 mg daily by mouth.     Pharmacist Choice Lancets MISC      Polyethyl Glycol-Propyl Glycol (SYSTANE) 0.4-0.3 % SOLN Place 1-2 drops into both eyes 3 (three) times daily as needed (dry/irritated eyes.).     prochlorperazine (COMPAZINE) 10 MG tablet Take 10 mg by mouth daily as needed for nausea or vomiting.     ramipril (ALTACE) 10 MG capsule Take 10 mg by mouth 2 (two) times daily.      Respiratory Therapy Supplies (CARETOUCH 2 CPAP HOSE HANGER) MISC See admin instructions.     No current facility-administered medications for this visit.    Social history is notable that he is married has one child and 4 grandchildren. He is retired. He has a 50 year history of tobacco use. He is retired from [employer].  Family History  Problem Relation Age of Onset   Heart attack Mother    Heart disease Mother    Diabetes Father    Stroke Father    Hypertension Father    Hyperlipidemia Sister    Hypertension Sister    Breast cancer Sister    ROS General: Negative; No fevers, chills, or night sweats;  HEENT: Negative; No changes in vision or hearing, sinus congestion, difficulty swallowing Pulmonary: Negative; No cough, wheezing, shortness of breath, hemoptysis Cardiovascular: Negative; No chest pain, presyncope, syncope, palpitations Improvement in recent claudication symptoms. GI: Negative; No nausea, vomiting, diarrhea, or abdominal pain GU: Negative; No dysuria, hematuria, or difficulty  voiding Musculoskeletal: Negative; no myalgias, joint pain, or weakness Hematologic/Oncology: Negative; no easy bruising, bleeding Endocrine: Positive for diabetes mellitus; no heat/cold  intolerance;  Neuro: Negative; no changes in balance, headaches Skin: Negative; No rashes or skin lesions Psychiatric: Negative; No behavioral problems, depression Sleep: Positive for obstructive sleep apnea on CPAP. No snoring, daytime sleepiness, hypersomnolence, bruxism, restless legs, hypnogognic hallucinations, no cataplexy Other comprehensive 14 point system review is negative.   PE BP 100/62 (BP Location: Left Arm, Patient Position: Sitting, Cuff Size: Normal)   Pulse 87   Ht 5' 9.5" (1.765 m)   Wt 169 lb (76.7 kg)   BMI 24.60 kg/m    Repeat blood pressure by me was 114/64 in the sitting position  Wt Readings from Last 3 Encounters:  04/10/21 169 lb (76.7 kg)  04/04/21 169 lb 12.8 oz (77 kg)  03/14/21 168 lb 11.2 oz (76.5 kg)   General: Alert, oriented, no distress.  Skin: normal turgor, no rashes, warm and dry HEENT: Normocephalic, atraumatic. Pupils equal round and reactive to light; sclera anicteric; extraocular muscles intact; Fundi ** Nose without nasal septal hypertrophy Mouth/Parynx benign; Mallinpatti scale 3 Neck: No JVD, no carotid bruits; normal carotid upstroke Lungs: clear to ausculatation and percussion; no wheezing or rales Chest wall: without tenderness to palpitation Heart: PMI not displaced, RRR, s1 s2 normal, 1/6 systolic murmur, no diastolic murmur, no rubs, gallops, thrills, or heaves Abdomen: soft, nontender; no hepatosplenomehaly, BS+; abdominal aorta nontender and not dilated by palpation. Back: no CVA tenderness Pulses 2+ Musculoskeletal: full range of motion, normal strength, no joint deformities Extremities: no clubbing cyanosis or edema, Homan's sign negative  Neurologic: grossly nonfocal; Cranial nerves grossly wnl Psychologic: Normal mood and affect   November 23, 2022ECG (independently read by me): probable sinus wih PACs vs AF at 87  December 2019 ECG (independently read by me): Normal sinus rhythm at 72 bpm.  Mild first-degree block  with appeared 208 ms.  Nonspecific ST-T changes inferolaterally.  November 2018 ECG (independently read by me): Normal sinus rhythm at 67 bpm.  First-degree AV block with a PR interval at 210 ms.  No significant ST-T changes.  May 2017 ECG (independently read by me): Normal sinus rhythm at 67 bpm.  First-degree AV block with a PR interval of 208 ms.  Nonspecific ST changes.  April 2016 ECG (independently read by me): Normal sinus rhythm at 67 bpm.  Borderline first-degree AV block with a PR interval 26 ms.  No significant ST-T changes.  September 2015 ECG (independently read by me): Sinus rhythm with marked sinus arrhythmia with PACs with low atrial etiology and PACs with retrograde P waves.  Prior ECG (independently read by me): Normal sinus rhythm at 64 beats per minute. No significant ST changes. Mild first-degree AV block with a PR interval of 212 ms.  LABS:  BMP Latest Ref Rng & Units 04/04/2021 03/14/2021 02/21/2021  Glucose 70 - 99 mg/dL 96 111(H) 89  BUN 8 - 23 mg/dL 21 21 27(H)  Creatinine 0.61 - 1.24 mg/dL 1.22 1.27(H) 1.26(H)  Sodium 135 - 145 mmol/L 140 138 143  Potassium 3.5 - 5.1 mmol/L 4.5 4.5 4.5  Chloride 98 - 111 mmol/L 104 105 106  CO2 22 - 32 mmol/L 24 21(L) 24  Calcium 8.9 - 10.3 mg/dL 10.0 10.0 9.7    Hepatic Function Latest Ref Rng & Units 04/04/2021 03/14/2021 02/21/2021  Total Protein 6.5 - 8.1 g/dL 7.6 7.4 7.0  Albumin 3.5 - 5.0 g/dL 2.5(L) 2.5(L) 2.5(L)  AST 15 - 41 U/L 12(L) 11(L) 12(L)  ALT 0 - 44 U/L $Remo'11 11 14  'STgWr$ Alk Phosphatase 38 - 126 U/L 120 109 106  Total Bilirubin 0.3 - 1.2 mg/dL <0.2(L) 0.3 <0.2(L)    CBC Latest Ref Rng & Units 04/04/2021 03/14/2021 02/21/2021  WBC 4.0 - 10.5 K/uL 5.3 5.4 4.4  Hemoglobin 13.0 - 17.0 g/dL 9.5(L) 9.1(L) 9.2(L)  Hematocrit 39.0 - 52.0 % 29.8(L) 28.5(L) 29.4(L)  Platelets 150 - 400 K/uL 394 358 342   Lab Results  Component Value Date   MCV 92.8 04/04/2021   MCV 93.4 03/14/2021   MCV 98.3 02/21/2021    Lab  Results  Component Value Date   TSH 1.300 04/04/2021   Lab Results  Component Value Date   HGBA1C 6.6 (H) 01/06/2019     Lipid Panel     Component Value Date/Time   CHOL 139 05/16/2014 1115   TRIG 111 05/16/2014 1115   HDL 49 05/16/2014 1115   CHOLHDL 2.8 05/16/2014 1115   VLDL 22 05/16/2014 1115   LDLCALC 68 05/16/2014 1115     RADIOLOGY: No results found  IMPRESSION:  1. Coronary artery disease involving native coronary artery of native heart without angina pectoris   2. Essential hypertension   3. Hyperlipidemia LDL goal <70   4. OSA on CPAP   5. Type 2 diabetes mellitus with other circulatory complication, without long-term current use of insulin ([place])   6. Metastatic non-small cell lung cancer [facility])     ASSESSMENT AND PLAN:  Mr. Keeon Zurn is a 75 year old African American male who has a long history of documented CAD and has undergone prior interventions to his diagonal, circumflex, and RCA at several sites. His last cardiac catheterization revealed patent stents in the RCA and circumflex but he had 90% in-stent restenosis of a small diagonal vessel for which medical therapy was recommended.  At present, he continues to be stable from an anginal standpoint.  His blood pressure today is well controlled on his current regimen of amlodipine 5 mg metoprolol tartrate 25 mg twice a day, hydrochlorothiazide 25 mg and ramipril 10 mg daily.  He continues to be on atorvastatin 80 mg and Zetia 10 mg for lipid-lowering therapy.  His LDL cholesterol in August 2022 was 92.  If this continues to be elevated he would be a candidate for institution of PCSK9 inhibition.  He is on anticoagulation therapy with Eliquis and has been diagnosed with stage IV lung cancer followed by Dr. Earlie Server.  He has completed radiation treatment and is undergoing infusions with Keytruda for which he already has received 33 treatments.  He continues to use CPAP which is not managed by me but at [facility] but we  were able to obtain a download today which shows suboptimal compliance although AHI was excellent at 0.6 on his current settings.  He is diabetic on metformin.  With reference to his lung disease he is on albuterol, and Symbicort therapy.  I have recommended he see Almyra Deforest, PA for follow-up in 6 months with subsequent follow-up with me in 1 year.    Troy Sine, MD, [facility] 04/17/2021 5:03 PM

## 2021-04-16 DIAGNOSIS — R3915 Urgency of urination: Secondary | ICD-10-CM | POA: Diagnosis not present

## 2021-04-16 DIAGNOSIS — R350 Frequency of micturition: Secondary | ICD-10-CM | POA: Diagnosis not present

## 2021-04-16 DIAGNOSIS — R3912 Poor urinary stream: Secondary | ICD-10-CM | POA: Diagnosis not present

## 2021-04-17 ENCOUNTER — Encounter: Payer: Self-pay | Admitting: Cardiovascular Disease

## 2021-04-23 ENCOUNTER — Ambulatory Visit (HOSPITAL_COMMUNITY)
Admission: RE | Admit: 2021-04-23 | Discharge: 2021-04-23 | Disposition: A | Discharge status: Home / Self Care | Payer: Medicare Other | Source: Ambulatory Visit | Attending: Internal Medicine | Admitting: Internal Medicine

## 2021-04-23 ENCOUNTER — Other Ambulatory Visit: Payer: Self-pay

## 2021-04-23 DIAGNOSIS — C3490 Malignant neoplasm of unspecified part of unspecified bronchus or lung: Secondary | ICD-10-CM | POA: Diagnosis not present

## 2021-04-23 DIAGNOSIS — I700 Atherosclerosis of aorta: Secondary | ICD-10-CM | POA: Diagnosis not present

## 2021-04-23 DIAGNOSIS — J439 Emphysema, unspecified: Secondary | ICD-10-CM | POA: Diagnosis not present

## 2021-04-23 DIAGNOSIS — K7689 Other specified diseases of liver: Secondary | ICD-10-CM | POA: Diagnosis not present

## 2021-04-23 DIAGNOSIS — N281 Cyst of kidney, acquired: Secondary | ICD-10-CM | POA: Diagnosis not present

## 2021-04-25 ENCOUNTER — Other Ambulatory Visit: Payer: Self-pay

## 2021-04-25 ENCOUNTER — Inpatient Hospital Stay: Payer: Medicare Other

## 2021-04-25 ENCOUNTER — Inpatient Hospital Stay: Payer: Medicare Other | Attending: Internal Medicine | Admitting: Internal Medicine

## 2021-04-25 ENCOUNTER — Encounter: Payer: Self-pay | Admitting: Internal Medicine

## 2021-04-25 ENCOUNTER — Other Ambulatory Visit: Payer: Self-pay | Admitting: Physician Assistant

## 2021-04-25 VITALS — BP 100/64 | HR 92 | Temp 97.8°F | Resp 17 | Ht 69.5 in | Wt 167.1 lb

## 2021-04-25 DIAGNOSIS — R630 Anorexia: Secondary | ICD-10-CM | POA: Insufficient documentation

## 2021-04-25 DIAGNOSIS — C3490 Malignant neoplasm of unspecified part of unspecified bronchus or lung: Secondary | ICD-10-CM

## 2021-04-25 DIAGNOSIS — C3412 Malignant neoplasm of upper lobe, left bronchus or lung: Secondary | ICD-10-CM

## 2021-04-25 DIAGNOSIS — D649 Anemia, unspecified: Secondary | ICD-10-CM

## 2021-04-25 DIAGNOSIS — N289 Disorder of kidney and ureter, unspecified: Secondary | ICD-10-CM | POA: Insufficient documentation

## 2021-04-25 DIAGNOSIS — Z79899 Other long term (current) drug therapy: Secondary | ICD-10-CM | POA: Insufficient documentation

## 2021-04-25 DIAGNOSIS — C7971 Secondary malignant neoplasm of right adrenal gland: Secondary | ICD-10-CM | POA: Diagnosis not present

## 2021-04-25 LAB — CMP (CANCER CENTER ONLY)
ALT: 13 U/L (ref 0–44)
AST: 12 U/L — ABNORMAL LOW (ref 15–41)
Albumin: 2.3 g/dL — ABNORMAL LOW (ref 3.5–5.0)
Alkaline Phosphatase: 101 U/L (ref 38–126)
Anion gap: 12 (ref 5–15)
BUN: 35 mg/dL — ABNORMAL HIGH (ref 8–23)
CO2: 24 mmol/L (ref 22–32)
Calcium: 9.5 mg/dL (ref 8.9–10.3)
Chloride: 103 mmol/L (ref 98–111)
Creatinine: 1.46 mg/dL — ABNORMAL HIGH (ref 0.61–1.24)
GFR, Estimated: 50 mL/min — ABNORMAL LOW (ref >60)
Glucose, Bld: 94 mg/dL (ref 70–99)
Potassium: 4.6 mmol/L (ref 3.5–5.1)
Sodium: 139 mmol/L (ref 135–145)
Total Bilirubin: 0.2 mg/dL — ABNORMAL LOW (ref 0.3–1.2)
Total Protein: 7 g/dL (ref 6.5–8.1)

## 2021-04-25 LAB — CBC WITH DIFFERENTIAL (CANCER CENTER ONLY)
Abs Immature Granulocytes: 0.01 10*3/uL (ref 0.00–0.07)
Basophils Absolute: 0 10*3/uL (ref 0.0–0.1)
Basophils Relative: 1 %
Eosinophils Absolute: 0.1 10*3/uL (ref 0.0–0.5)
Eosinophils Relative: 2 %
HCT: 26.6 % — ABNORMAL LOW (ref 39.0–52.0)
Hemoglobin: 8.3 g/dL — ABNORMAL LOW (ref 13.0–17.0)
Immature Granulocytes: 0 %
Lymphocytes Relative: 20 %
Lymphs Abs: 1.1 10*3/uL (ref 0.7–4.0)
MCH: 28.1 pg (ref 26.0–34.0)
MCHC: 31.2 g/dL (ref 30.0–36.0)
MCV: 90.2 fL (ref 80.0–100.0)
Monocytes Absolute: 0.8 10*3/uL (ref 0.1–1.0)
Monocytes Relative: 15 %
Neutro Abs: 3.4 10*3/uL (ref 1.7–7.7)
Neutrophils Relative %: 62 %
Platelet Count: 356 10*3/uL (ref 150–400)
RBC: 2.95 MIL/uL — ABNORMAL LOW (ref 4.22–5.81)
RDW: 15.7 % — ABNORMAL HIGH (ref 11.5–15.5)
WBC Count: 5.3 10*3/uL (ref 4.0–10.5)
nRBC: 0 % (ref 0.0–0.2)

## 2021-04-25 LAB — TSH: TSH: 0.984 u[IU]/mL (ref 0.320–4.118)

## 2021-04-25 NOTE — Progress Notes (Signed)
[place] [phone]   [phone]  OFFICE PROGRESS NOTE  [name], MD [address]  [place] [id number]  DIAGNOSIS:  Stage IV (T1b, N3, M1c) non-small cell lung cancer, adenocarcinoma. 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  Biomarker Findings Tumor Mutational Burden - 10 Muts/Mb Microsatellite status - MS-Stable Genomic Findings For a complete list of the genes assayed, please refer to the Appendix. ERBB2 amplification - equivocal? FPO25 P89* FANCC splice site [id number] SMAD4 Q289* TP53 E298* 7 Disease relevant genes with no reportable alterations: ALK, BRAF, EGFR, KRAS, MET, RET, ROS1    PDL1 expression is 30%.  PRIOR THERAPY: 1) Weekly concurrent chemoradiation with carboplatin for an AUC of 2 and paclitaxel 45 mg/m2 to the locally advanced disease in the chest. He started the first dose 01/31/2019.  Status post 7 cycles.  Last dose of chemotherapy was given on March 14, 2019. 2) palliative radiotherapy to the metastatic disease in the right adrenal gland. 3) palliative radiotherapy to the metastatic deposit in the right obturator externus and abductor magnus under the care of Dr. [name]. 4) Systemic chemotherapy with carboplatin for AUC of 5, Alimta 500 mg/M2 and Keytruda 200 mg IV every 3 weeks.  First dose April 20, 2019. Status post 35 cycles.   Starting from cycle #5, the patient is on maintenance treatment with Alimta and Keytruda every 3 weeks.  Recently the patient has been on treatment with single agent Keytruda secondary to renal insufficiency.   CURRENT THERAPY: None  INTERVAL HISTORY: Trevor Bailey 75 y.o. male returns to the clinic today for follow-up visit.  The patient is feeling fine today with no concerning complaints  except for mild fatigue.  He denied having any chest pain, shortness of breath, cough or hemoptysis.  He denied having any fever or chills.  He has no nausea, vomiting, diarrhea or constipation.  He denied having any headache or visual changes.  He has no significant weight loss or night sweats.  He has been tolerating his treatment with Keytruda fairly well.  He had repeat CT scan of the chest, abdomen pelvis performed recently and he is here for evaluation and discussion of his scan results.  MEDICAL HISTORY: Past Medical History:  Diagnosis Date   Adenopathy    left supraclavicular lymph node   Allergies    COPD (chronic obstructive pulmonary disease) ([place])    Coronary disease    Diabetes ([place])    Enlarged prostate    GERD (gastroesophageal reflux disease)    Glaucoma    Hyperlipidemia    Hypertension    Myocardial infarction ([place])    nscl ca dx'd 12/2018   Sleep apnea    wears CPAP   Wears partial dentures    upper and lower    ALLERGIES:  has no allergies on file.  MEDICATIONS:  Current Outpatient Medications  Medication Sig Dispense Refill   ACCU-CHEK GUIDE test strip      albuterol (VENTOLIN HFA) 108 (90 Base) MCG/ACT inhaler      amLODipine (NORVASC) 5 MG tablet TAKE 1 TABLET BY MOUTH  DAILY 90 tablet 3   apixaban (ELIQUIS) 5 MG TABS tablet Take 1 tablet (5 mg total) by mouth 2 (two) times daily. 60 tablet 5   aspirin EC 81 MG tablet Take  81 mg by mouth daily.     atorvastatin (LIPITOR) 80 MG tablet Take 80 mg by mouth daily.     budesonide-formoterol (SYMBICORT) 160-4.5 MCG/ACT inhaler Inhale 2 puffs into the lungs 2 (two) times daily.     ezetimibe (ZETIA) 10 MG tablet Take 10 mg by mouth daily.     finasteride (PROSCAR) 5 MG tablet Take 5 mg by mouth daily.      folic acid (FOLVITE) 1 MG tablet TAKE 1 TABLET BY MOUTH  DAILY 90 tablet 3   gabapentin (NEURONTIN) 300 MG capsule Take 300 mg by mouth 2 (two) times daily.     hydrochlorothiazide (HYDRODIURIL) 25 MG tablet  Take 25 mg by mouth daily.      HYDROcodone-acetaminophen (NORCO/VICODIN) 5-325 MG tablet Take 1 tablet by mouth daily as needed.     latanoprost (XALATAN) 0.005 % ophthalmic solution Place 1 drop into both eyes at bedtime.      metFORMIN (GLUCOPHAGE) 1000 MG tablet Take 1,000 mg by mouth 2 (two) times daily.      metoprolol tartrate (LOPRESSOR) 25 MG tablet Take 25 mg 2 (two) times daily by mouth.     mirtazapine (REMERON) 30 MG tablet TAKE 1 TABLET BY MOUTH AT  BEDTIME 90 tablet 3   Multiple Vitamin (MULTIVITAMIN WITH MINERALS) TABS tablet Take 1 tablet by mouth daily. Centrum Silver     nitroGLYCERIN (NITROSTAT) 0.4 MG SL tablet Place 1 tablet (0.4 mg total) under the tongue every 5 (five) minutes as needed for chest pain. 25 tablet 6   ofloxacin (FLOXIN) 0.3 % OTIC solution      OVER THE COUNTER MEDICATION Take 1 tablet by mouth daily as needed. Colace     pantoprazole (PROTONIX) 40 MG tablet Take 40 mg daily by mouth.     Pharmacist Choice Lancets MISC      Polyethyl Glycol-Propyl Glycol (SYSTANE) 0.4-0.3 % SOLN Place 1-2 drops into both eyes 3 (three) times daily as needed (dry/irritated eyes.).     prochlorperazine (COMPAZINE) 10 MG tablet Take 10 mg by mouth daily as needed for nausea or vomiting.     ramipril (ALTACE) 10 MG capsule Take 10 mg by mouth 2 (two) times daily.      Respiratory Therapy Supplies (CARETOUCH 2 CPAP HOSE HANGER) MISC See admin instructions.     No current facility-administered medications for this visit.    SURGICAL HISTORY:  Past Surgical History:  Procedure Laterality Date   ABDOMINAL AORTAGRAM Left 09/16/2013   Procedure: ABDOMINAL AORTAGRAM;  Surgeon: Elam Dutch, MD;  Location: [facility] CATH LAB;  Service: Cardiovascular;  Laterality: Left;   CARDIAC CATHETERIZATION  01/2011   When there was segmental stenosis of the distal RCA, patent PCA stent, patent circumflex stent, and a patent small diagonal with 90% ISR.    COLONOSCOPY W/ BIOPSIES AND POLYPECTOMY      CORONARY ANGIOPLASTY  may 2002   Non-DES stenting of his circumflex, non-DES stenting of the RCA   HYDROCELE EXCISION / REPAIR  2009   by Dr Jackquline Bosch HERNIA REPAIR Bilateral    Dr Bubba Camp   LYMPH NODE BIOPSY Left 01/10/2019   Procedure: left supraclavicular LYMPH NODE BIOPSY;  Surgeon: [name], MD;  Location: [facility];  Service: Thoracic;  Laterality: Left;   MULTIPLE TOOTH EXTRACTIONS     NM [place]  01/08/2011   moderate in size and intensity area of reversible ischemia in the basal to mid inferior and septal territories.  Abnormal study   stents  2008   proximal RCA, DES for progession of disease.   US ECHOCARDIOGRAPHY  01/12/2012   mild concentric LVH, borderline LA enlargement, mild to mod TR    REVIEW OF SYSTEMS:  Constitutional: positive for fatigue Eyes: negative Ears, nose, mouth, throat, and face: negative Respiratory: negative Cardiovascular: negative Gastrointestinal: negative Genitourinary:negative Integument/breast: negative Hematologic/lymphatic: negative Musculoskeletal:negative Neurological: negative Behavioral/Psych: negative Endocrine: negative Allergic/Immunologic: negative   PHYSICAL EXAMINATION: General appearance: alert, cooperative, fatigued, and no distress Head: Normocephalic, without obvious abnormality, atraumatic Neck: no adenopathy, no JVD, supple, symmetrical, trachea midline, and thyroid not enlarged, symmetric, no tenderness/mass/nodules Lymph nodes: Cervical, supraclavicular, and axillary nodes normal. Resp: clear to auscultation bilaterally Back: symmetric, no curvature. ROM normal. No CVA tenderness. Cardio: regular rate and rhythm, S1, S2 normal, no murmur, click, rub or gallop GI: soft, non-tender; bowel sounds normal; no masses,  no organomegaly Extremities: extremities normal, atraumatic, no cyanosis or edema Neurologic: Alert and oriented X 3, normal strength and tone. Normal symmetric reflexes. Normal  coordination and gait  ECOG PERFORMANCE STATUS: 1 - Symptomatic but completely ambulatory  Blood pressure 100/64, pulse 92, temperature 97.8 F (36.6 C), temperature source Temporal, resp. rate 17, height 5' 9.5" (1.765 m), weight 167 lb 1.6 oz (75.8 kg), SpO2 99 %.  LABORATORY DATA: Lab Results  Component Value Date   WBC 5.3 04/25/2021   HGB 8.3 (L) 04/25/2021   HCT 26.6 (L) 04/25/2021   MCV 90.2 04/25/2021   PLT 356 04/25/2021      Chemistry      Component Value Date/Time   NA 140 04/04/2021 0908   K 4.5 04/04/2021 0908   CL 104 04/04/2021 0908   CO2 24 04/04/2021 0908   BUN 21 04/04/2021 0908   CREATININE 1.22 04/04/2021 0908   CREATININE 0.94 05/16/2014 1109      Component Value Date/Time   CALCIUM 10.0 04/04/2021 0908   ALKPHOS 120 04/04/2021 0908   AST 12 (L) 04/04/2021 0908   ALT 11 04/04/2021 0908   BILITOT <0.2 (L) 04/04/2021 0908       RADIOGRAPHIC STUDIES: CT Abdomen Pelvis Wo Contrast  Result Date: 04/25/2021 CLINICAL DATA:  Non-small cell lung cancer staging evaluation in a 75 year old male. EXAM: CT CHEST, ABDOMEN AND PELVIS WITHOUT CONTRAST TECHNIQUE: Multidetector CT imaging of the chest, abdomen and pelvis was performed following the standard protocol without IV contrast. COMPARISON:  None. Comparison is made with examination from January 29, 2021. FINDINGS: CT CHEST FINDINGS Cardiovascular: Calcified atheromatous plaque of the thoracic aorta. Three-vessel coronary artery disease. Normal caliber of central pulmonary vascular tree ower. No substantial aortic dilation. Limited assessment of cardiovascular structures given lack of intravenous contrast. Mediastinum/Nodes: No thoracic inlet, axillary, mediastinal or hilar adenopathy. Esophagus grossly normal. Lungs/Pleura: Pulmonary emphysema. Small LEFT effusion slightly increased compared to previous imaging. Post treatment changes about the LEFT upper lobe with similar appearance to previous imaging as well.  RIGHT lung is clear aside from mild basilar atelectasis or scarring. Airways are patent. Musculoskeletal: See below for full musculoskeletal details. CT ABDOMEN PELVIS FINDINGS Hepatobiliary: Numerous low-density hepatic lesions without change, largest measuring 2.6 cm, these are compatible with hepatic cysts. No pericholecystic stranding or gross evidence of biliary duct distension. Pancreas: Pancreas with normal contour. No signs of adjacent inflammation. Spleen: Normal. Adrenals/Urinary Tract: Adrenal glands are normal. Renal cortical scarring. The no hydronephrosis. Urinary bladder is collapsed without signs of adjacent stranding. No nephrolithiasis. Stable low-density lesions to the extent evaluated on noncontrast imaging  largest is clearly a cyst along the posterior aspect of the RIGHT kidney measuring 2.1 cm. Stomach/Bowel: No acute gastrointestinal process. The appendix is normal. Vascular/Lymphatic: Aortic atherosclerosis. No sign of aneurysm. Smooth contour of the IVC. There is no gastrohepatic or hepatoduodenal ligament lymphadenopathy. No retroperitoneal or mesenteric lymphadenopathy. No pelvic sidewall lymphadenopathy. Limited assessment of vascular structures given lack of intravenous contrast. Reproductive: Mildly enlarged and partially calcified prostate gland with similar appearance to prior imaging. Other: No ascites. Musculoskeletal: Osteopenia. No acute bone finding. No destructive bone process. Spinal degenerative changes. Thickening of trabeculation at the L2 vertebral level is unchanged. Appearance could be related to vertebral hemangioma or even Paget's. IMPRESSION: Post treatment changes about the LEFT upper lobe with similar appearance to previous imaging. No evidence of metastatic disease to the chest, abdomen or pelvis. Small LEFT effusion slightly increased compared to previous imaging. Three-vessel coronary artery disease. Stable renal and hepatic cysts. Emphysema and aortic  atherosclerosis. Aortic Atherosclerosis (ICD10-I70.0) and Emphysema (ICD10-J43.9). Electronically Signed   By: Zetta Bills M.D.   On: 04/25/2021 09:07   CT Chest Wo Contrast  Result Date: 04/25/2021 CLINICAL DATA:  Non-small cell lung cancer staging evaluation in a 75 year old male. EXAM: CT CHEST, ABDOMEN AND PELVIS WITHOUT CONTRAST TECHNIQUE: Multidetector CT imaging of the chest, abdomen and pelvis was performed following the standard protocol without IV contrast. COMPARISON:  None. Comparison is made with examination from January 29, 2021. FINDINGS: CT CHEST FINDINGS Cardiovascular: Calcified atheromatous plaque of the thoracic aorta. Three-vessel coronary artery disease. Normal caliber of central pulmonary vascular tree ower. No substantial aortic dilation. Limited assessment of cardiovascular structures given lack of intravenous contrast. Mediastinum/Nodes: No thoracic inlet, axillary, mediastinal or hilar adenopathy. Esophagus grossly normal. Lungs/Pleura: Pulmonary emphysema. Small LEFT effusion slightly increased compared to previous imaging. Post treatment changes about the LEFT upper lobe with similar appearance to previous imaging as well. RIGHT lung is clear aside from mild basilar atelectasis or scarring. Airways are patent. Musculoskeletal: See below for full musculoskeletal details. CT ABDOMEN PELVIS FINDINGS Hepatobiliary: Numerous low-density hepatic lesions without change, largest measuring 2.6 cm, these are compatible with hepatic cysts. No pericholecystic stranding or gross evidence of biliary duct distension. Pancreas: Pancreas with normal contour. No signs of adjacent inflammation. Spleen: Normal. Adrenals/Urinary Tract: Adrenal glands are normal. Renal cortical scarring. The no hydronephrosis. Urinary bladder is collapsed without signs of adjacent stranding. No nephrolithiasis. Stable low-density lesions to the extent evaluated on noncontrast imaging largest is clearly a cyst along  the posterior aspect of the RIGHT kidney measuring 2.1 cm. Stomach/Bowel: No acute gastrointestinal process. The appendix is normal. Vascular/Lymphatic: Aortic atherosclerosis. No sign of aneurysm. Smooth contour of the IVC. There is no gastrohepatic or hepatoduodenal ligament lymphadenopathy. No retroperitoneal or mesenteric lymphadenopathy. No pelvic sidewall lymphadenopathy. Limited assessment of vascular structures given lack of intravenous contrast. Reproductive: Mildly enlarged and partially calcified prostate gland with similar appearance to prior imaging. Other: No ascites. Musculoskeletal: Osteopenia. No acute bone finding. No destructive bone process. Spinal degenerative changes. Thickening of trabeculation at the L2 vertebral level is unchanged. Appearance could be related to vertebral hemangioma or even Paget's. IMPRESSION: Post treatment changes about the LEFT upper lobe with similar appearance to previous imaging. No evidence of metastatic disease to the chest, abdomen or pelvis. Small LEFT effusion slightly increased compared to previous imaging. Three-vessel coronary artery disease. Stable renal and hepatic cysts. Emphysema and aortic atherosclerosis. Aortic Atherosclerosis (ICD10-I70.0) and Emphysema (ICD10-J43.9). Electronically Signed   By: Jewel Baize.D.  On: 04/25/2021 09:07    ASSESSMENT AND PLAN: This is a very pleasant 75 years old African-American male with stage IV non-small cell lung cancer, adenocarcinoma with no actionable mutation and PD-L1 expression of 30%, presented with locally advanced disease in the chest as well as solitary metastasis to the right adrenal gland. The patient completed a course of concurrent chemoradiation to the chest with weekly carboplatin for AUC of 2 and paclitaxel 45 mg/M2 status post 7 cycles..  The patient had evidence for disease progression and he was started on systemic chemotherapy with carboplatin, Alimta and Keytruda for 4 cycles and he is  currently on maintenance treatment with Alimta and Keytruda every 3 weeks status post 31 more cycles.  He is currently on treatment with single agent Keytruda because of the renal insufficiency. The patient completed a total of 35 cycles of treatment with immunotherapy.  He has been tolerating this treatment well with no concerning adverse effect except for fatigue. He had repeat CT scan of the chest, abdomen pelvis performed recently.  I personally and independently reviewed the scans and discussed the results with the patient today. His scan showed no concerning findings for disease progression. I recommended for the patient to discontinue his treatment with immunotherapy at this point. I will continue to monitor him from now on with repeat imaging studies. I will see him back for follow-up visit in 3 months for evaluation with repeat CT scan of the chest, abdomen and pelvis for restaging of his disease. For the lack of appetite, he will continue his current treatment on Remeron. The patient was advised to call immediately if he has any concerning symptoms in the interval. The patient voices understanding of current disease status and treatment options and is in agreement with the current care plan. All questions were answered. The patient knows to call the clinic with any problems, questions or concerns. We can certainly see the patient much sooner if necessary.   Disclaimer: This note was dictated with voice recognition software. Similar sounding words can inadvertently be transcribed and may not be corrected upon review.

## 2021-05-02 ENCOUNTER — Telehealth: Payer: Self-pay | Admitting: Internal Medicine

## 2021-05-02 NOTE — Telephone Encounter (Signed)
Sch per 12/8 los, pt aware

## 2021-05-05 ENCOUNTER — Other Ambulatory Visit: Payer: Self-pay

## 2021-05-16 ENCOUNTER — Ambulatory Visit: Payer: Medicare Other

## 2021-05-16 ENCOUNTER — Ambulatory Visit: Payer: Medicare Other | Admitting: Physician Assistant

## 2021-05-16 ENCOUNTER — Other Ambulatory Visit: Payer: Medicare Other

## 2021-05-16 DIAGNOSIS — E1151 Type 2 diabetes mellitus with diabetic peripheral angiopathy without gangrene: Secondary | ICD-10-CM | POA: Diagnosis not present

## 2021-05-16 DIAGNOSIS — E7800 Pure hypercholesterolemia, unspecified: Secondary | ICD-10-CM | POA: Diagnosis not present

## 2021-05-16 DIAGNOSIS — J449 Chronic obstructive pulmonary disease, unspecified: Secondary | ICD-10-CM | POA: Diagnosis not present

## 2021-05-16 DIAGNOSIS — N1831 Chronic kidney disease, stage 3a: Secondary | ICD-10-CM | POA: Diagnosis not present

## 2021-05-16 DIAGNOSIS — I2510 Atherosclerotic heart disease of native coronary artery without angina pectoris: Secondary | ICD-10-CM | POA: Diagnosis not present

## 2021-05-16 DIAGNOSIS — E782 Mixed hyperlipidemia: Secondary | ICD-10-CM | POA: Diagnosis not present

## 2021-05-16 DIAGNOSIS — E1169 Type 2 diabetes mellitus with other specified complication: Secondary | ICD-10-CM | POA: Diagnosis not present

## 2021-05-16 DIAGNOSIS — I10 Essential (primary) hypertension: Secondary | ICD-10-CM | POA: Diagnosis not present

## 2021-05-16 DIAGNOSIS — E1159 Type 2 diabetes mellitus with other circulatory complications: Secondary | ICD-10-CM | POA: Diagnosis not present

## 2021-05-16 DIAGNOSIS — J441 Chronic obstructive pulmonary disease with (acute) exacerbation: Secondary | ICD-10-CM | POA: Diagnosis not present

## 2021-06-06 ENCOUNTER — Other Ambulatory Visit: Payer: Medicare Other

## 2021-06-06 ENCOUNTER — Ambulatory Visit: Payer: Medicare Other | Admitting: Internal Medicine

## 2021-06-06 ENCOUNTER — Ambulatory Visit: Payer: Medicare Other

## 2021-06-11 DIAGNOSIS — Z961 Presence of intraocular lens: Secondary | ICD-10-CM | POA: Diagnosis not present

## 2021-06-11 DIAGNOSIS — D3132 Benign neoplasm of left choroid: Secondary | ICD-10-CM | POA: Diagnosis not present

## 2021-06-11 DIAGNOSIS — H35372 Puckering of macula, left eye: Secondary | ICD-10-CM | POA: Diagnosis not present

## 2021-06-11 DIAGNOSIS — H35033 Hypertensive retinopathy, bilateral: Secondary | ICD-10-CM | POA: Diagnosis not present

## 2021-06-11 DIAGNOSIS — H43812 Vitreous degeneration, left eye: Secondary | ICD-10-CM | POA: Diagnosis not present

## 2021-06-11 DIAGNOSIS — E119 Type 2 diabetes mellitus without complications: Secondary | ICD-10-CM | POA: Diagnosis not present

## 2021-06-11 DIAGNOSIS — H04123 Dry eye syndrome of bilateral lacrimal glands: Secondary | ICD-10-CM | POA: Diagnosis not present

## 2021-06-11 DIAGNOSIS — H353131 Nonexudative age-related macular degeneration, bilateral, early dry stage: Secondary | ICD-10-CM | POA: Diagnosis not present

## 2021-06-11 DIAGNOSIS — H401131 Primary open-angle glaucoma, bilateral, mild stage: Secondary | ICD-10-CM | POA: Diagnosis not present

## 2021-06-27 ENCOUNTER — Ambulatory Visit: Payer: Medicare Other

## 2021-06-27 ENCOUNTER — Ambulatory Visit: Payer: Medicare Other | Admitting: Physician Assistant

## 2021-06-27 ENCOUNTER — Other Ambulatory Visit: Payer: Medicare Other

## 2021-07-15 DIAGNOSIS — J449 Chronic obstructive pulmonary disease, unspecified: Secondary | ICD-10-CM | POA: Diagnosis not present

## 2021-07-15 DIAGNOSIS — I739 Peripheral vascular disease, unspecified: Secondary | ICD-10-CM | POA: Diagnosis not present

## 2021-07-15 DIAGNOSIS — N1831 Chronic kidney disease, stage 3a: Secondary | ICD-10-CM | POA: Diagnosis not present

## 2021-07-15 DIAGNOSIS — E7800 Pure hypercholesterolemia, unspecified: Secondary | ICD-10-CM | POA: Diagnosis not present

## 2021-07-15 DIAGNOSIS — Z0000 Encounter for general adult medical examination without abnormal findings: Secondary | ICD-10-CM | POA: Diagnosis not present

## 2021-07-15 DIAGNOSIS — I6529 Occlusion and stenosis of unspecified carotid artery: Secondary | ICD-10-CM | POA: Diagnosis not present

## 2021-07-15 DIAGNOSIS — I519 Heart disease, unspecified: Secondary | ICD-10-CM | POA: Diagnosis not present

## 2021-07-15 DIAGNOSIS — E1159 Type 2 diabetes mellitus with other circulatory complications: Secondary | ICD-10-CM | POA: Diagnosis not present

## 2021-07-15 DIAGNOSIS — E1165 Type 2 diabetes mellitus with hyperglycemia: Secondary | ICD-10-CM | POA: Diagnosis not present

## 2021-07-15 DIAGNOSIS — Z1389 Encounter for screening for other disorder: Secondary | ICD-10-CM | POA: Diagnosis not present

## 2021-07-15 DIAGNOSIS — I10 Essential (primary) hypertension: Secondary | ICD-10-CM | POA: Diagnosis not present

## 2021-07-15 DIAGNOSIS — I2510 Atherosclerotic heart disease of native coronary artery without angina pectoris: Secondary | ICD-10-CM | POA: Diagnosis not present

## 2021-07-15 DIAGNOSIS — Z7984 Long term (current) use of oral hypoglycemic drugs: Secondary | ICD-10-CM | POA: Diagnosis not present

## 2021-07-22 ENCOUNTER — Inpatient Hospital Stay: Payer: Medicare Other | Attending: Internal Medicine

## 2021-07-22 ENCOUNTER — Other Ambulatory Visit: Payer: Self-pay

## 2021-07-22 ENCOUNTER — Ambulatory Visit (HOSPITAL_COMMUNITY)
Admission: RE | Admit: 2021-07-22 | Discharge: 2021-07-22 | Disposition: A | Discharge status: Home / Self Care | Payer: Medicare Other | Source: Ambulatory Visit | Attending: Internal Medicine | Admitting: Internal Medicine

## 2021-07-22 DIAGNOSIS — R911 Solitary pulmonary nodule: Secondary | ICD-10-CM | POA: Insufficient documentation

## 2021-07-22 DIAGNOSIS — C7971 Secondary malignant neoplasm of right adrenal gland: Secondary | ICD-10-CM | POA: Diagnosis not present

## 2021-07-22 DIAGNOSIS — I700 Atherosclerosis of aorta: Secondary | ICD-10-CM | POA: Diagnosis not present

## 2021-07-22 DIAGNOSIS — C3490 Malignant neoplasm of unspecified part of unspecified bronchus or lung: Secondary | ICD-10-CM

## 2021-07-22 DIAGNOSIS — C3412 Malignant neoplasm of upper lobe, left bronchus or lung: Secondary | ICD-10-CM | POA: Diagnosis not present

## 2021-07-22 DIAGNOSIS — K7689 Other specified diseases of liver: Secondary | ICD-10-CM | POA: Diagnosis not present

## 2021-07-22 DIAGNOSIS — I2510 Atherosclerotic heart disease of native coronary artery without angina pectoris: Secondary | ICD-10-CM | POA: Insufficient documentation

## 2021-07-22 DIAGNOSIS — Z923 Personal history of irradiation: Secondary | ICD-10-CM | POA: Diagnosis not present

## 2021-07-22 DIAGNOSIS — Z79899 Other long term (current) drug therapy: Secondary | ICD-10-CM | POA: Diagnosis not present

## 2021-07-22 DIAGNOSIS — J90 Pleural effusion, not elsewhere classified: Secondary | ICD-10-CM | POA: Insufficient documentation

## 2021-07-22 DIAGNOSIS — Z9221 Personal history of antineoplastic chemotherapy: Secondary | ICD-10-CM | POA: Diagnosis not present

## 2021-07-22 DIAGNOSIS — J432 Centrilobular emphysema: Secondary | ICD-10-CM | POA: Diagnosis not present

## 2021-07-22 DIAGNOSIS — C801 Malignant (primary) neoplasm, unspecified: Secondary | ICD-10-CM | POA: Diagnosis not present

## 2021-07-22 DIAGNOSIS — C7972 Secondary malignant neoplasm of left adrenal gland: Secondary | ICD-10-CM | POA: Insufficient documentation

## 2021-07-22 DIAGNOSIS — C7800 Secondary malignant neoplasm of unspecified lung: Secondary | ICD-10-CM | POA: Diagnosis not present

## 2021-07-22 DIAGNOSIS — J439 Emphysema, unspecified: Secondary | ICD-10-CM | POA: Diagnosis not present

## 2021-07-22 DIAGNOSIS — K5730 Diverticulosis of large intestine without perforation or abscess without bleeding: Secondary | ICD-10-CM | POA: Diagnosis not present

## 2021-07-22 LAB — CMP (CANCER CENTER ONLY)
ALT: 15 U/L (ref 0–44)
AST: 14 U/L — ABNORMAL LOW (ref 15–41)
Albumin: 3.3 g/dL — ABNORMAL LOW (ref 3.5–5.0)
Alkaline Phosphatase: 95 U/L (ref 38–126)
Anion gap: 13 (ref 5–15)
BUN: 29 mg/dL — ABNORMAL HIGH (ref 8–23)
CO2: 26 mmol/L (ref 22–32)
Calcium: 10.2 mg/dL (ref 8.9–10.3)
Chloride: 102 mmol/L (ref 98–111)
Creatinine: 1.25 mg/dL — ABNORMAL HIGH (ref 0.61–1.24)
GFR, Estimated: 60 mL/min — ABNORMAL LOW (ref >60)
Glucose, Bld: 125 mg/dL — ABNORMAL HIGH (ref 70–99)
Potassium: 4.1 mmol/L (ref 3.5–5.1)
Sodium: 141 mmol/L (ref 135–145)
Total Bilirubin: 0.3 mg/dL (ref 0.3–1.2)
Total Protein: 7.9 g/dL (ref 6.5–8.1)

## 2021-07-22 LAB — CBC WITH DIFFERENTIAL (CANCER CENTER ONLY)
Abs Immature Granulocytes: 0.02 10*3/uL (ref 0.00–0.07)
Basophils Absolute: 0.1 10*3/uL (ref 0.0–0.1)
Basophils Relative: 1 %
Eosinophils Absolute: 0.1 10*3/uL (ref 0.0–0.5)
Eosinophils Relative: 2 %
HCT: 28.6 % — ABNORMAL LOW (ref 39.0–52.0)
Hemoglobin: 8.6 g/dL — ABNORMAL LOW (ref 13.0–17.0)
Immature Granulocytes: 0 %
Lymphocytes Relative: 14 %
Lymphs Abs: 0.9 10*3/uL (ref 0.7–4.0)
MCH: 25.7 pg — ABNORMAL LOW (ref 26.0–34.0)
MCHC: 30.1 g/dL (ref 30.0–36.0)
MCV: 85.4 fL (ref 80.0–100.0)
Monocytes Absolute: 0.7 10*3/uL (ref 0.1–1.0)
Monocytes Relative: 11 %
Neutro Abs: 4.5 10*3/uL (ref 1.7–7.7)
Neutrophils Relative %: 72 %
Platelet Count: 547 10*3/uL — ABNORMAL HIGH (ref 150–400)
RBC: 3.35 MIL/uL — ABNORMAL LOW (ref 4.22–5.81)
RDW: 18.1 % — ABNORMAL HIGH (ref 11.5–15.5)
WBC Count: 6.2 10*3/uL (ref 4.0–10.5)
nRBC: 0 % (ref 0.0–0.2)

## 2021-07-22 LAB — TSH: TSH: 1.643 u[IU]/mL (ref 0.320–4.118)

## 2021-07-25 ENCOUNTER — Inpatient Hospital Stay: Payer: Medicare Other | Admitting: Internal Medicine

## 2021-07-25 ENCOUNTER — Other Ambulatory Visit: Payer: Self-pay

## 2021-07-25 VITALS — BP 102/62 | HR 101 | Temp 97.4°F | Resp 18 | Ht 69.5 in | Wt 165.0 lb

## 2021-07-25 DIAGNOSIS — C3412 Malignant neoplasm of upper lobe, left bronchus or lung: Secondary | ICD-10-CM

## 2021-07-25 DIAGNOSIS — C7971 Secondary malignant neoplasm of right adrenal gland: Secondary | ICD-10-CM | POA: Diagnosis not present

## 2021-07-25 DIAGNOSIS — C7972 Secondary malignant neoplasm of left adrenal gland: Secondary | ICD-10-CM | POA: Diagnosis not present

## 2021-07-25 NOTE — Progress Notes (Signed)
[place] [phone]   [phone]  OFFICE PROGRESS NOTE  [name], MD [address] [place] [address]  DIAGNOSIS:  Stage IV (T1b, N3, M1c) non-small cell lung cancer, adenocarcinoma. 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  Biomarker Findings Tumor Mutational Burden - 10 Muts/Mb Microsatellite status - MS-Stable Genomic Findings For a complete list of the genes assayed, please refer to the Appendix. ERBB2 amplification - equivocal EUM35 T61* FANCC splice site 443+1V>Q SMAD4 Q289* TP53 E298* 7 Disease relevant genes with no reportable alterations: ALK, BRAF, EGFR, KRAS, MET, RET, ROS1    PDL1 expression is 30%.  PRIOR THERAPY: 1) Weekly concurrent chemoradiation with carboplatin for an AUC of 2 and paclitaxel 45 mg/m2 to the locally advanced disease in the chest. He started the first dose 01/31/2019.  Status post 7 cycles.  Last dose of chemotherapy was given on March 14, 2019. 2) palliative radiotherapy to the metastatic disease in the right adrenal gland. 3) palliative radiotherapy to the metastatic deposit in the right obturator externus and abductor magnus under the care of Dr. [name]. 4) Systemic chemotherapy with carboplatin for AUC of 5, Alimta 500 mg/M2 and Keytruda 200 mg IV every 3 weeks.  First dose April 20, 2019. Status post 35 cycles.   Starting from cycle #5, the patient is on maintenance treatment with Alimta and Keytruda every 3 weeks.  Recently the patient has been on treatment with single agent Keytruda secondary to renal insufficiency.   CURRENT THERAPY: None  INTERVAL HISTORY: Trevor Bailey 76 y.o. male returns to the clinic today for follow-up visit.  The patient is feeling fine today with no concerning complaints  except for pain in the left neck area with a palpable mass.  He mentioned that he received radiotherapy to this area in the past under the care of Dr. [name] but there was no significant change in size since that time.  He denied having any current chest pain, shortness of breath, cough or hemoptysis.  He has no nausea, vomiting, diarrhea or constipation.  He has no headache or visual changes.  He has no recent weight loss or night sweats.  He had repeat CT scan of the chest, abdomen pelvis performed recently and he is here today for evaluation and discussion of his scan results.   MEDICAL HISTORY: Past Medical History:  Diagnosis Date   Adenopathy    left supraclavicular lymph node   Allergies    COPD (chronic obstructive pulmonary disease) ([place])    Coronary disease    Diabetes ([place])    Enlarged prostate    GERD (gastroesophageal reflux disease)    Glaucoma    Hyperlipidemia    Hypertension    Myocardial infarction ([place])    nscl ca dx'd 12/2018   Sleep apnea    wears CPAP   Wears partial dentures    upper and lower    ALLERGIES:  has no allergies on file.  MEDICATIONS:  Current Outpatient Medications  Medication Sig Dispense Refill   ACCU-CHEK GUIDE test strip      albuterol (VENTOLIN HFA) 108 (90 Base) MCG/ACT inhaler      amLODipine (NORVASC) 5 MG tablet TAKE 1 TABLET BY MOUTH  DAILY 90 tablet 3   apixaban (ELIQUIS) 5 MG TABS tablet Take 1  tablet (5 mg total) by mouth 2 (two) times daily. 60 tablet 5   aspirin EC 81 MG tablet Take 81 mg by mouth daily.     atorvastatin (LIPITOR) 80 MG tablet Take 80 mg by mouth daily.     budesonide-formoterol (SYMBICORT) 160-4.5 MCG/ACT inhaler Inhale 2 puffs into the lungs 2 (two) times daily.     ezetimibe (ZETIA) 10 MG tablet Take 10 mg by mouth daily.     finasteride (PROSCAR) 5 MG tablet Take 5 mg by mouth daily.      folic acid (FOLVITE) 1 MG tablet TAKE 1 TABLET BY MOUTH  DAILY 90 tablet 3   gabapentin (NEURONTIN) 300 MG capsule Take  300 mg by mouth 2 (two) times daily.     hydrochlorothiazide (HYDRODIURIL) 25 MG tablet Take 25 mg by mouth daily.      HYDROcodone-acetaminophen (NORCO/VICODIN) 5-325 MG tablet Take 1 tablet by mouth daily as needed.     latanoprost (XALATAN) 0.005 % ophthalmic solution Place 1 drop into both eyes at bedtime.      metFORMIN (GLUCOPHAGE) 1000 MG tablet Take 1,000 mg by mouth 2 (two) times daily.      metoprolol tartrate (LOPRESSOR) 25 MG tablet Take 25 mg 2 (two) times daily by mouth.     mirtazapine (REMERON) 30 MG tablet TAKE 1 TABLET BY MOUTH AT  BEDTIME 90 tablet 3   Multiple Vitamin (MULTIVITAMIN WITH MINERALS) TABS tablet Take 1 tablet by mouth daily. Centrum Silver     nitroGLYCERIN (NITROSTAT) 0.4 MG SL tablet Place 1 tablet (0.4 mg total) under the tongue every 5 (five) minutes as needed for chest pain. 25 tablet 6   ofloxacin (FLOXIN) 0.3 % OTIC solution      OVER THE COUNTER MEDICATION Take 1 tablet by mouth daily as needed. Colace     pantoprazole (PROTONIX) 40 MG tablet Take 40 mg daily by mouth.     Pharmacist Choice Lancets MISC      Polyethyl Glycol-Propyl Glycol (SYSTANE) 0.4-0.3 % SOLN Place 1-2 drops into both eyes 3 (three) times daily as needed (dry/irritated eyes.).     prochlorperazine (COMPAZINE) 10 MG tablet Take 10 mg by mouth daily as needed for nausea or vomiting.     ramipril (ALTACE) 10 MG capsule Take 10 mg by mouth 2 (two) times daily.      Respiratory Therapy Supplies (CARETOUCH 2 CPAP HOSE HANGER) MISC See admin instructions.     No current facility-administered medications for this visit.    SURGICAL HISTORY:  Past Surgical History:  Procedure Laterality Date   ABDOMINAL AORTAGRAM Left 09/16/2013   Procedure: ABDOMINAL AORTAGRAM;  Surgeon: Elam Dutch, MD;  Location: [facility] CATH LAB;  Service: Cardiovascular;  Laterality: Left;   CARDIAC CATHETERIZATION  01/2011   When there was segmental stenosis of the distal RCA, patent PCA stent, patent circumflex  stent, and a patent small diagonal with 90% ISR.    COLONOSCOPY W/ BIOPSIES AND POLYPECTOMY     CORONARY ANGIOPLASTY  may 2002   Non-DES stenting of his circumflex, non-DES stenting of the RCA   HYDROCELE EXCISION / REPAIR  2009   by Dr Jackquline Bosch HERNIA REPAIR Bilateral    Dr Bubba Camp   LYMPH NODE BIOPSY Left 01/10/2019   Procedure: left supraclavicular LYMPH NODE BIOPSY;  Surgeon: [name], MD;  Location: [place];  Service: Thoracic;  Laterality: Left;   MULTIPLE TOOTH EXTRACTIONS     NM [place]  01/08/2011   moderate in size and intensity area of reversible ischemia in the basal to mid inferior and septal territories. Abnormal study   stents  2008   proximal RCA, DES for progession of disease.   US ECHOCARDIOGRAPHY  01/12/2012   mild concentric LVH, borderline LA enlargement, mild to mod TR    REVIEW OF SYSTEMS:  Constitutional: positive for fatigue Eyes: negative Ears, nose, mouth, throat, and face: negative Respiratory: negative Cardiovascular: negative Gastrointestinal: negative Genitourinary:negative Integument/breast: negative Hematologic/lymphatic: negative Musculoskeletal:positive for neck pain Neurological: negative Behavioral/Psych: negative Endocrine: negative Allergic/Immunologic: negative   PHYSICAL EXAMINATION: General appearance: alert, cooperative, fatigued, and no distress Head: Normocephalic, without obvious abnormality, atraumatic Neck: no JVD, supple, symmetrical, trachea midline, thyroid not enlarged, symmetric, no tenderness/mass/nodules, and palpable left neck and supraclavicular lymphadenopathy Lymph nodes: Supraclavicular adenopathy: Large left supraclavicular lymph node Resp: clear to auscultation bilaterally Back: symmetric, no curvature. ROM normal. No CVA tenderness. Cardio: regular rate and rhythm, S1, S2 normal, no murmur, click, rub or gallop GI: soft, non-tender; bowel sounds normal; no masses,  no  organomegaly Extremities: extremities normal, atraumatic, no cyanosis or edema Neurologic: Alert and oriented X 3, normal strength and tone. Normal symmetric reflexes. Normal coordination and gait    ECOG PERFORMANCE STATUS: 1 - Symptomatic but completely ambulatory  Blood pressure 102/62, pulse (!) 101, temperature (!) 97.4 F (36.3 C), temperature source Tympanic, resp. rate 18, height 5' 9.5" (1.765 m), weight 165 lb (74.8 kg), SpO2 100 %.  LABORATORY DATA: Lab Results  Component Value Date   WBC 6.2 07/22/2021   HGB 8.6 (L) 07/22/2021   HCT 28.6 (L) 07/22/2021   MCV 85.4 07/22/2021   PLT 547 (H) 07/22/2021      Chemistry      Component Value Date/Time   NA 141 07/22/2021 1032   K 4.1 07/22/2021 1032   CL 102 07/22/2021 1032   CO2 26 07/22/2021 1032   BUN 29 (H) 07/22/2021 1032   CREATININE 1.25 (H) 07/22/2021 1032   CREATININE 0.94 05/16/2014 1109      Component Value Date/Time   CALCIUM 10.2 07/22/2021 1032   ALKPHOS 95 07/22/2021 1032   AST 14 (L) 07/22/2021 1032   ALT 15 07/22/2021 1032   BILITOT 0.3 07/22/2021 1032       RADIOGRAPHIC STUDIES: CT Abdomen Pelvis Wo Contrast  Result Date: 07/22/2021 CLINICAL DATA:  Metastatic left upper lobe non-small cell lung cancer restaging, ongoing chemotherapy and immunotherapy, XRT complete * onc * EXAM: CT CHEST, ABDOMEN AND PELVIS WITHOUT CONTRAST TECHNIQUE: Multidetector CT imaging of the chest, abdomen and pelvis was performed following the standard protocol without IV contrast. Oral enteric contrast was administered. RADIATION DOSE REDUCTION: This exam was performed according to the departmental dose-optimization program which includes automated exposure control, adjustment of the mA and/or kV according to patient size and/or use of iterative reconstruction technique. COMPARISON:  04/23/2021 FINDINGS: CT CHEST FINDINGS Cardiovascular: Aortic atherosclerosis. Normal heart size. Three-vessel coronary artery calcifications  and/or stents. No pericardial effusion. Mediastinum/Nodes: No enlarged mediastinal, hilar, or axillary lymph nodes. Thyroid gland, trachea, and esophagus demonstrate no significant findings. Lungs/Pleura: Moderate centrilobular and paraseptal emphysema. Diffuse bilateral bronchial wall thickening. Redemonstrated, predominantly bandlike post treatment appearance of the paramedian left upper lobe and apex (series 4, image 27). Interval enlargement of a nodule of the anterior left apex, measuring 0.8 x 0.5 cm, previously no greater than 0.3 cm (series 4, image 22). Background of very fine centrilobular pulmonary nodularity, most concentrated in the lung apices.  Small, left greater than right pleural effusions, slightly increased compared to prior examination. Incidental note of a conspicuous lingular fissure. Musculoskeletal: No chest wall mass or suspicious osseous lesions identified. CT ABDOMEN PELVIS FINDINGS Hepatobiliary: No solid liver abnormality is seen. Multiple unchanged low-attenuation liver lesions, incompletely characterized by noncontrast CT although again presumably benign cysts or hemangiomata. No gallstones, gallbladder wall thickening, or biliary dilatation. Pancreas: Unremarkable. No pancreatic ductal dilatation or surrounding inflammatory changes. Spleen: Normal in size without significant abnormality. Adrenals/Urinary Tract: Adrenal glands are unremarkable. Previously noted right adrenal metastasis is not apparent at this time. Kidneys are normal, without renal calculi, solid lesion, or hydronephrosis. Bladder is unremarkable. Stomach/Bowel: Stomach is within normal limits. Appendix appears normal. No evidence of bowel wall thickening, distention, or inflammatory changes. Descending colonic diverticula. Vascular/Lymphatic: Aortic atherosclerosis. No enlarged abdominal or pelvic lymph nodes. Reproductive: Mild prostatomegaly. Other: No abdominal wall hernia or abnormality. No ascites.  Musculoskeletal: No acute osseous findings. Unchanged heterogeneous, trabeculated appearance of the L2 vertebral body, which may reflect a vertebral body hemangioma or monostotic Paget's disease (series 6, image 89). IMPRESSION: 1. Redemonstrated, predominantly bandlike post treatment/post radiation appearance of the paramedian left upper lobe and apex. 2. Interval enlargement of a nodule of the anterior left apex, measuring 0.8 cm, previously no greater than 0.3 cm. This is concerning for recurrent or metastatic disease. 3. Small, left greater than right pleural effusions, slightly increased compared to prior examination. 4. Previously noted right adrenal metastasis is not apparent at this time. 5. No noncontrast CT evidence of metastatic disease in the abdomen or pelvis. 6. Emphysema and diffuse bilateral bronchial wall thickening. Background of very fine centrilobular pulmonary nodularity, most concentrated in the lung apices, consistent with smoking-related respiratory bronchiolitis. 7. Coronary artery disease. Aortic Atherosclerosis (ICD10-I70.0) and Emphysema (ICD10-J43.9). Electronically Signed   By: Delanna Ahmadi M.D.   On: 07/22/2021 14:59   CT Chest Wo Contrast  Result Date: 07/22/2021 CLINICAL DATA:  Metastatic left upper lobe non-small cell lung cancer restaging, ongoing chemotherapy and immunotherapy, XRT complete * onc * EXAM: CT CHEST, ABDOMEN AND PELVIS WITHOUT CONTRAST TECHNIQUE: Multidetector CT imaging of the chest, abdomen and pelvis was performed following the standard protocol without IV contrast. Oral enteric contrast was administered. RADIATION DOSE REDUCTION: This exam was performed according to the departmental dose-optimization program which includes automated exposure control, adjustment of the mA and/or kV according to patient size and/or use of iterative reconstruction technique. COMPARISON:  04/23/2021 FINDINGS: CT CHEST FINDINGS Cardiovascular: Aortic atherosclerosis. Normal heart  size. Three-vessel coronary artery calcifications and/or stents. No pericardial effusion. Mediastinum/Nodes: No enlarged mediastinal, hilar, or axillary lymph nodes. Thyroid gland, trachea, and esophagus demonstrate no significant findings. Lungs/Pleura: Moderate centrilobular and paraseptal emphysema. Diffuse bilateral bronchial wall thickening. Redemonstrated, predominantly bandlike post treatment appearance of the paramedian left upper lobe and apex (series 4, image 27). Interval enlargement of a nodule of the anterior left apex, measuring 0.8 x 0.5 cm, previously no greater than 0.3 cm (series 4, image 22). Background of very fine centrilobular pulmonary nodularity, most concentrated in the lung apices. Small, left greater than right pleural effusions, slightly increased compared to prior examination. Incidental note of a conspicuous lingular fissure. Musculoskeletal: No chest wall mass or suspicious osseous lesions identified. CT ABDOMEN PELVIS FINDINGS Hepatobiliary: No solid liver abnormality is seen. Multiple unchanged low-attenuation liver lesions, incompletely characterized by noncontrast CT although again presumably benign cysts or hemangiomata. No gallstones, gallbladder wall thickening, or biliary dilatation. Pancreas: Unremarkable. No pancreatic ductal dilatation or surrounding  inflammatory changes. Spleen: Normal in size without significant abnormality. Adrenals/Urinary Tract: Adrenal glands are unremarkable. Previously noted right adrenal metastasis is not apparent at this time. Kidneys are normal, without renal calculi, solid lesion, or hydronephrosis. Bladder is unremarkable. Stomach/Bowel: Stomach is within normal limits. Appendix appears normal. No evidence of bowel wall thickening, distention, or inflammatory changes. Descending colonic diverticula. Vascular/Lymphatic: Aortic atherosclerosis. No enlarged abdominal or pelvic lymph nodes. Reproductive: Mild prostatomegaly. Other: No abdominal wall  hernia or abnormality. No ascites. Musculoskeletal: No acute osseous findings. Unchanged heterogeneous, trabeculated appearance of the L2 vertebral body, which may reflect a vertebral body hemangioma or monostotic Paget's disease (series 6, image 89). IMPRESSION: 1. Redemonstrated, predominantly bandlike post treatment/post radiation appearance of the paramedian left upper lobe and apex. 2. Interval enlargement of a nodule of the anterior left apex, measuring 0.8 cm, previously no greater than 0.3 cm. This is concerning for recurrent or metastatic disease. 3. Small, left greater than right pleural effusions, slightly increased compared to prior examination. 4. Previously noted right adrenal metastasis is not apparent at this time. 5. No noncontrast CT evidence of metastatic disease in the abdomen or pelvis. 6. Emphysema and diffuse bilateral bronchial wall thickening. Background of very fine centrilobular pulmonary nodularity, most concentrated in the lung apices, consistent with smoking-related respiratory bronchiolitis. 7. Coronary artery disease. Aortic Atherosclerosis (ICD10-I70.0) and Emphysema (ICD10-J43.9). Electronically Signed   By: Delanna Ahmadi M.D.   On: 07/22/2021 14:59    ASSESSMENT AND PLAN: This is a very pleasant 76 years old African-American male with stage IV non-small cell lung cancer, adenocarcinoma with no actionable mutation and PD-L1 expression of 30%, presented with locally advanced disease in the chest as well as solitary metastasis to the right adrenal gland. The patient completed a course of concurrent chemoradiation to the chest with weekly carboplatin for AUC of 2 and paclitaxel 45 mg/M2 status post 7 cycles..  The patient had evidence for disease progression and he was started on systemic chemotherapy with carboplatin, Alimta and Keytruda for 4 cycles and he is currently on maintenance treatment with Alimta and Keytruda every 3 weeks status post 31 more cycles.  He is currently on  treatment with single agent Keytruda because of the renal insufficiency. The patient completed a total of 35 cycles of treatment with immunotherapy.  He has been tolerating this treatment well with no concerning adverse effect except for fatigue. The patient has been on observation for the last 3 months and he is feeling fine except for the palpable left neck supraclavicular lymph node that was previously treated with radiation by Dr. [name]. He had repeat CT scan of the chest, abdomen pelvis performed recently.  I personally and independently reviewed the scan images and discussed the result with the patient. His scan showed no concerning findings for any disease progression except for slightly enlarging anterior left apical pulmonary nodule that need close monitoring and may consider him for SBRT to this nodule if it continues to increase in size. For the left neck mass, I will order CT of the neck to evaluate this area and to rule out any disease progression. For the lack of appetite, he will continue his current treatment on Remeron. I will see him back for follow-up visit in 2 weeks for evaluation and more discussion of his treatment options based on the finding on the CT of the neck. The patient was advised to call immediately if he has any other concerning symptoms in the interval. The patient voices understanding of current  disease status and treatment options and is in agreement with the current care plan. All questions were answered. The patient knows to call the clinic with any problems, questions or concerns. We can certainly see the patient much sooner if necessary.   Disclaimer: This note was dictated with voice recognition software. Similar sounding words can inadvertently be transcribed and may not be corrected upon review.

## 2021-07-26 DIAGNOSIS — I10 Essential (primary) hypertension: Secondary | ICD-10-CM | POA: Diagnosis not present

## 2021-07-27 ENCOUNTER — Other Ambulatory Visit: Payer: Self-pay | Admitting: Cardiovascular Disease

## 2021-07-27 DIAGNOSIS — I4891 Unspecified atrial fibrillation: Secondary | ICD-10-CM

## 2021-07-29 NOTE — Telephone Encounter (Signed)
Prescription refill request for Eliquis received. ?Indication:None ?Last office visit:11/22 ?Scr:1.2 ?Age: 76 ?Weight:74.8 kg ? ?Prescription refilled ? ?

## 2021-08-01 ENCOUNTER — Ambulatory Visit (HOSPITAL_COMMUNITY)
Admission: RE | Admit: 2021-08-01 | Discharge: 2021-08-01 | Disposition: A | Discharge status: Home / Self Care | Payer: Medicare Other | Source: Ambulatory Visit | Attending: Internal Medicine | Admitting: Internal Medicine

## 2021-08-01 ENCOUNTER — Other Ambulatory Visit: Payer: Self-pay

## 2021-08-01 DIAGNOSIS — R221 Localized swelling, mass and lump, neck: Secondary | ICD-10-CM | POA: Diagnosis not present

## 2021-08-01 DIAGNOSIS — C3412 Malignant neoplasm of upper lobe, left bronchus or lung: Secondary | ICD-10-CM | POA: Diagnosis not present

## 2021-08-01 MED ORDER — SODIUM CHLORIDE (PF) 0.9 % IJ SOLN
INTRAMUSCULAR | Status: AC
  Filled 2021-08-01: qty 50

## 2021-08-01 MED ORDER — IOHEXOL 300 MG/ML  SOLN
100.0000 mL | Freq: Once | INTRAMUSCULAR | Status: AC | PRN
  Administered 2021-08-01: 75 mL via INTRAVENOUS

## 2021-08-02 ENCOUNTER — Telehealth: Payer: Self-pay

## 2021-08-02 NOTE — Telephone Encounter (Signed)
Call report received for pt: ? ?IMPRESSION: ?13 x 6 x 5.6 cm left supraclavicular and posterior neck malignancy involving the distal clavicle and intrinsic neck muscles. Malignant appearing left cervical nodes adjacent to the bulky tumor. ?

## 2021-08-05 ENCOUNTER — Inpatient Hospital Stay: Payer: Medicare Other | Admitting: Internal Medicine

## 2021-08-05 ENCOUNTER — Telehealth: Payer: Self-pay | Admitting: Radiation Oncology

## 2021-08-05 ENCOUNTER — Other Ambulatory Visit: Payer: Self-pay

## 2021-08-05 VITALS — BP 126/74 | HR 99 | Temp 97.5°F | Resp 17 | Wt 164.6 lb

## 2021-08-05 DIAGNOSIS — C7971 Secondary malignant neoplasm of right adrenal gland: Secondary | ICD-10-CM | POA: Diagnosis not present

## 2021-08-05 DIAGNOSIS — C3490 Malignant neoplasm of unspecified part of unspecified bronchus or lung: Secondary | ICD-10-CM

## 2021-08-05 DIAGNOSIS — C3412 Malignant neoplasm of upper lobe, left bronchus or lung: Secondary | ICD-10-CM | POA: Diagnosis not present

## 2021-08-05 DIAGNOSIS — C7972 Secondary malignant neoplasm of left adrenal gland: Secondary | ICD-10-CM | POA: Diagnosis not present

## 2021-08-05 NOTE — Progress Notes (Signed)
? ?    [place] ?[phone]   [phone] ? ?OFFICE PROGRESS NOTE ? ?[name], MD ?[address] ?[place] [address] ? ?DIAGNOSIS:  Stage IV (T1b, N3, M1c) non-small cell lung cancer, adenocarcinoma. 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 ? ?Biomarker Findings ?Tumor Mutational Burden - 10 Muts/Mb ?Microsatellite status - MS-Stable ?Genomic Findings ?For a complete list of the genes assayed, please refer to the Appendix. ?ERBB2 amplification - equivocal? ?STK11 K83* ?[place] splice site 510+2H>E ?SMAD4 Q289* ?TP53 E298* ?7 Disease relevant genes with no reportable alterations: ALK, BRAF, ?EGFR, KRAS, MET, RET, ROS1  ?  ?PDL1 expression is 30%. ? ?PRIOR THERAPY: ?1) Weekly concurrent chemoradiation with carboplatin for an AUC of 2 and paclitaxel 45 mg/m2 to the locally advanced disease in the chest. He started the first dose 01/31/2019.  Status post 7 cycles.  Last dose of chemotherapy was given on March 14, 2019. ?2) palliative radiotherapy to the metastatic disease in the right adrenal gland. ?3) palliative radiotherapy to the metastatic deposit in the right obturator externus and abductor magnus under the care of Dr. [name]. ?4) Systemic chemotherapy with carboplatin for AUC of 5, Alimta 500 mg/M2 and Keytruda 200 mg IV every 3 weeks.  First dose April 20, 2019. Status post 35 cycles.   Starting from cycle #5, the patient is on maintenance treatment with Alimta and Keytruda every 3 weeks.  Recently the patient has been on treatment with single agent Keytruda secondary to renal insufficiency. ?  ?CURRENT THERAPY: None ? ?INTERVAL HISTORY: Camden Knotek Arroyave 76 y.o. male returns to the clinic today for follow-up visit.  The patient continues to have pain on the left side of the neck.   He was given pain medication by his primary care physician but not helping much.  He had a significant palpable lymphadenopathy in the left supraclavicular area.  He denied having any current chest pain, shortness of breath, cough or hemoptysis.  He denied having any fever or chills.  He has no nausea, vomiting, diarrhea or constipation.  He has no headache or visual changes.  He has no recent weight loss or night sweats.  He had CT scan of the neck performed recently and he is here for evaluation and discussion of his scan results and treatment options. ? ? ?MEDICAL HISTORY: ?Past Medical History:  ?Diagnosis Date  ? Adenopathy   ? left supraclavicular lymph node  ? Allergies   ? COPD (chronic obstructive pulmonary disease) ([place])   ? Coronary disease   ? Diabetes ([place])   ? Enlarged prostate   ? GERD (gastroesophageal reflux disease)   ? Glaucoma   ? Hyperlipidemia   ? Hypertension   ? Myocardial infarction [facility])   ? nscl ca dx'd 12/2018  ? Sleep apnea   ? wears CPAP  ? Wears partial dentures   ? upper and lower  ? ? ?ALLERGIES:  has no allergies on file. ? ?MEDICATIONS:  ?Current Outpatient Medications  ?Medication Sig Dispense Refill  ? ACCU-CHEK GUIDE test strip     ? albuterol (VENTOLIN HFA) 108 (90 Base) MCG/ACT inhaler     ? amLODipine (NORVASC) 5 MG tablet TAKE 1 TABLET BY MOUTH  DAILY 90 tablet 3  ? aspirin EC 81 MG tablet Take 81 mg by mouth daily.    ?  atorvastatin (LIPITOR) 80 MG tablet Take 80 mg by mouth daily.    ? budesonide-formoterol (SYMBICORT) 160-4.5 MCG/ACT inhaler Inhale 2 puffs into the lungs 2 (two) times daily.    ? ELIQUIS 5 MG TABS tablet TAKE 1 TABLET BY MOUTH  TWICE DAILY 180 tablet 3  ? ezetimibe (ZETIA) 10 MG tablet Take 10 mg by mouth daily.    ? finasteride (PROSCAR) 5 MG tablet Take 5 mg by mouth daily.     ? folic acid (FOLVITE) 1 MG tablet TAKE 1 TABLET BY MOUTH  DAILY 90 tablet 3  ? gabapentin (NEURONTIN) 300 MG capsule Take 300 mg by mouth 2 (two) times daily.    ?  hydrochlorothiazide (HYDRODIURIL) 25 MG tablet Take 25 mg by mouth daily.     ? HYDROcodone-acetaminophen (NORCO/VICODIN) 5-325 MG tablet Take 1 tablet by mouth daily as needed.    ? latanoprost (XALATAN) 0.005 % ophthalmic solution Place 1 drop into both eyes at bedtime.     ? metFORMIN (GLUCOPHAGE) 1000 MG tablet Take 1,000 mg by mouth 2 (two) times daily.     ? metoprolol tartrate (LOPRESSOR) 25 MG tablet Take 25 mg 2 (two) times daily by mouth.    ? mirtazapine (REMERON) 30 MG tablet TAKE 1 TABLET BY MOUTH AT  BEDTIME 90 tablet 3  ? Multiple Vitamin (MULTIVITAMIN WITH MINERALS) TABS tablet Take 1 tablet by mouth daily. Centrum Silver    ? nitroGLYCERIN (NITROSTAT) 0.4 MG SL tablet Place 1 tablet (0.4 mg total) under the tongue every 5 (five) minutes as needed for chest pain. 25 tablet 6  ? ofloxacin (FLOXIN) 0.3 % OTIC solution     ? OVER THE COUNTER MEDICATION Take 1 tablet by mouth daily as needed. Colace    ? pantoprazole (PROTONIX) 40 MG tablet Take 40 mg daily by mouth.    ? Pharmacist Choice Lancets MISC     ? Polyethyl Glycol-Propyl Glycol (SYSTANE) 0.4-0.3 % SOLN Place 1-2 drops into both eyes 3 (three) times daily as needed (dry/irritated eyes.).    ? prochlorperazine (COMPAZINE) 10 MG tablet Take 10 mg by mouth daily as needed for nausea or vomiting.    ? ramipril (ALTACE) 10 MG capsule Take 10 mg by mouth 2 (two) times daily.     ? Respiratory Therapy Supplies (CARETOUCH 2 CPAP HOSE HANGER) MISC See admin instructions.    ? ?No current facility-administered medications for this visit.  ? ? ?SURGICAL HISTORY:  ?Past Surgical History:  ?Procedure Laterality Date  ? ABDOMINAL AORTAGRAM Left 09/16/2013  ? Procedure: ABDOMINAL AORTAGRAM;  Surgeon: Elam Dutch, MD;  Location: [facility] CATH LAB;  Service: Cardiovascular;  Laterality: Left;  ? CARDIAC CATHETERIZATION  01/2011  ? When there was segmental stenosis of the distal RCA, patent PCA stent, patent circumflex stent, and a patent small diagonal with 90%  ISR.   ? COLONOSCOPY W/ BIOPSIES AND POLYPECTOMY    ? CORONARY ANGIOPLASTY  may 2002  ? Non-DES stenting of his circumflex, non-DES stenting of the RCA  ? HYDROCELE EXCISION / REPAIR  2009  ? by Dr Janice Norrie  ? INGUINAL HERNIA REPAIR Bilateral   ? Dr Bubba Camp  ? LYMPH NODE BIOPSY Left 01/10/2019  ? Procedure: left supraclavicular LYMPH NODE BIOPSY;  Surgeon: [name], MD;  Location: [facility];  Service: Thoracic;  Laterality: Left;  ? MULTIPLE TOOTH EXTRACTIONS    ? NM MYOCAR PERF WALL MOTION  01/08/2011  ? moderate in size and intensity area of reversible ischemia  in the basal to mid inferior and septal territories. Abnormal study  ? stents  2008  ? proximal RCA, DES for progession of disease.  ? US ECHOCARDIOGRAPHY  01/12/2012  ? mild concentric LVH, borderline LA enlargement, mild to mod TR  ? ? ?REVIEW OF SYSTEMS:  Constitutional: positive for fatigue ?Eyes: negative ?Ears, nose, mouth, throat, and face: negative ?Respiratory: negative ?Cardiovascular: negative ?Gastrointestinal: negative ?Genitourinary:negative ?Integument/breast: negative ?Hematologic/lymphatic: negative ?Musculoskeletal:positive for neck pain ?Neurological: negative ?Behavioral/Psych: negative ?Endocrine: negative ?Allergic/Immunologic: negative  ? ?PHYSICAL EXAMINATION: General appearance: alert, cooperative, fatigued, and no distress ?Head: Normocephalic, without obvious abnormality, atraumatic ?Neck: no JVD, supple, symmetrical, trachea midline, thyroid not enlarged, symmetric, no tenderness/mass/nodules, and palpable left neck and supraclavicular lymphadenopathy ?Lymph nodes: Supraclavicular adenopathy: Large left supraclavicular lymph node ?Resp: clear to auscultation bilaterally ?Back: symmetric, no curvature. ROM normal. No CVA tenderness. ?Cardio: regular rate and rhythm, S1, S2 normal, no murmur, click, rub or gallop ?GI: soft, non-tender; bowel sounds normal; no masses,  no organomegaly ?Extremities: extremities normal, atraumatic, no  cyanosis or edema ?Neurologic: Alert and oriented X 3, normal strength and tone. Normal symmetric reflexes. Normal coordination and gait ? ? ? ?ECOG PERFORMANCE STATUS: 1 - Symptomatic but completely ambulatory ? ?Bl

## 2021-08-06 ENCOUNTER — Ambulatory Visit
Admission: RE | Admit: 2021-08-06 | Discharge: 2021-08-06 | Disposition: A | Discharge status: Home / Self Care | Payer: Medicare Other | Source: Ambulatory Visit | Attending: Radiation Oncology | Admitting: Radiation Oncology

## 2021-08-06 VITALS — BP 105/70 | HR 94 | Temp 96.0°F | Resp 18 | Ht 69.5 in | Wt 164.0 lb

## 2021-08-06 DIAGNOSIS — C7971 Secondary malignant neoplasm of right adrenal gland: Secondary | ICD-10-CM | POA: Diagnosis not present

## 2021-08-06 DIAGNOSIS — J449 Chronic obstructive pulmonary disease, unspecified: Secondary | ICD-10-CM | POA: Diagnosis not present

## 2021-08-06 DIAGNOSIS — E785 Hyperlipidemia, unspecified: Secondary | ICD-10-CM | POA: Diagnosis not present

## 2021-08-06 DIAGNOSIS — Z7982 Long term (current) use of aspirin: Secondary | ICD-10-CM | POA: Diagnosis not present

## 2021-08-06 DIAGNOSIS — Z79899 Other long term (current) drug therapy: Secondary | ICD-10-CM | POA: Insufficient documentation

## 2021-08-06 DIAGNOSIS — C770 Secondary and unspecified malignant neoplasm of lymph nodes of head, face and neck: Secondary | ICD-10-CM | POA: Insufficient documentation

## 2021-08-06 DIAGNOSIS — C3412 Malignant neoplasm of upper lobe, left bronchus or lung: Secondary | ICD-10-CM | POA: Insufficient documentation

## 2021-08-06 DIAGNOSIS — Z87891 Personal history of nicotine dependence: Secondary | ICD-10-CM | POA: Diagnosis not present

## 2021-08-06 DIAGNOSIS — E119 Type 2 diabetes mellitus without complications: Secondary | ICD-10-CM | POA: Insufficient documentation

## 2021-08-06 DIAGNOSIS — I700 Atherosclerosis of aorta: Secondary | ICD-10-CM | POA: Insufficient documentation

## 2021-08-06 DIAGNOSIS — K219 Gastro-esophageal reflux disease without esophagitis: Secondary | ICD-10-CM | POA: Insufficient documentation

## 2021-08-06 DIAGNOSIS — I252 Old myocardial infarction: Secondary | ICD-10-CM | POA: Diagnosis not present

## 2021-08-06 DIAGNOSIS — Z7901 Long term (current) use of anticoagulants: Secondary | ICD-10-CM | POA: Diagnosis not present

## 2021-08-06 DIAGNOSIS — G4730 Sleep apnea, unspecified: Secondary | ICD-10-CM | POA: Diagnosis not present

## 2021-08-06 DIAGNOSIS — C7989 Secondary malignant neoplasm of other specified sites: Secondary | ICD-10-CM | POA: Diagnosis not present

## 2021-08-06 MED ORDER — OXYCODONE HCL 5 MG PO TABS: 5.0000 mg | ORAL_TABLET | ORAL | 0 refills | Status: DC | PRN

## 2021-08-06 NOTE — Progress Notes (Signed)
Thoracic Location of Tumor / Histology: Left Supraclavicular lymph node ? ?Patient presented with complaints of pain on the left side of his neck.  He was seen by his PCP who gave him some pain medication which provided little relief. ? ?CT Neck 08/01/2021: 13 x 6 x 5.6 cm left supraclavicular and posterior neck malignancy involving the distal clavicle and intrinsic neck muscles. Malignant appearing left cervical nodes adjacent to the bulky tumor.  ? ?CT CAP 07/22/2021: Redemonstrated, predominantly bandlike post treatment/post radiation appearance of the paramedian left upper lobe and apex.  2. Interval enlargement of a nodule of the anterior left apex, measuring 0.8 cm, previously no greater than 0.3 cm. This is concerning for recurrent or metastatic disease. ?3. Small, left greater than right pleural effusions, slightly ?increased compared to prior examination. 4. Previously noted right adrenal metastasis is not apparent at this time.  5. No noncontrast CT evidence of metastatic disease in the abdomen or pelvis. ? ? ?Biopsies of  ? ? ?Past/Anticipated interventions by medical oncology, if any:  ?Dr. Julien Nordmann 08/05/2021 ?-CT scan of the neck performed recently showed 13.0 x 6.0 x 5.6 cm left supraclavicular and posterior neck malignancy involving the distal clavicle and the intrinsic neck muscles. ?-I discussed the scan results with the patient I recommended for him to see Dr. [name] as soon as possible to start palliative radiotherapy to this area.  I will also send a quick message to Dr. [name] to see the patient as soon as possible. ? ? ? ?Signs/Symptoms ?Weight changes, if any:  ?Respiratory complaints, if any: No ?Hemoptysis, if any: No ?Pain issues, if any:  Left neck-on the side and posterior, pain medication does not work.  He reports lack of sleep due to the pain. ?Numbness/Tingling/weakness: Denies ?Swallow: He reports some things have a hard time going down.  He has decreased appetite and has to force himself  to eat.  Drinking fluids well.  Drinking at least 3 bottles of ensure per day. ? ? ?SAFETY ISSUES: ?Prior radiation?  ?03/28/2019-04/07/2019 SBRT Treatment: ?The right adrenal gland target was treated to 50 Gy in 5 fractions.  ?  ?01/31/19 - 03/16/19: ?The patient was treated to the disease within the left lung including his left supraclavicular nodes initially to a dose of 60 Gy using a 4 field, 3-D conformal technique. The patient then received a cone down boost treatment for an additional 6 Gy. This yielded a final total dose of 66 Gy.  ? ? ?Pacemaker/ICD? No  ?Possible current pregnancy? N/a ?Is the patient on methotrexate? No ? ? ?Current Complaints / other details:   ?PRIOR THERAPY: ?1) Weekly concurrent chemoradiation with carboplatin for an AUC of 2 and paclitaxel 45 mg/m2 to the locally advanced disease in the chest. He started the first dose 01/31/2019.  Status post 7 cycles.  Last dose of chemotherapy was given on March 14, 2019. ?2) palliative radiotherapy to the metastatic disease in the right adrenal gland. ?3) palliative radiotherapy to the metastatic deposit in the right obturator externus and abductor magnus under the care of Dr. [name]. ?4) Systemic chemotherapy with carboplatin for AUC of 5, Alimta 500 mg/M2 and Keytruda 200 mg IV every 3 weeks.  First dose April 20, 2019. Status post 35 cycles.   Starting from cycle #5, the patient is on maintenance treatment with Alimta and Keytruda every 3 weeks.  Recently the patient has been on treatment with single agent Keytruda secondary to renal insufficiency. ?

## 2021-08-06 NOTE — Progress Notes (Signed)
?Radiation Oncology         [phone] ?________________________________ ? ?Name: Trevor Bailey        MRN: [id number]  ?Date of Service: 08/06/2021 DOB: 01-28-1946 ? ?VO:[id number], Trevor Moll, MD  Curt Bears, MD    ? ?REFERRING PHYSICIAN: Curt Bears, MD ? ? ?DIAGNOSIS: The primary encounter diagnosis was Primary malignant neoplasm of bronchus of left upper lobe ([place]). A diagnosis of Metastasis to supraclavicular lymph node ([facility]) was also pertinent to this visit. ? ? ?HISTORY OF PRESENT ILLNESS: Trevor Bailey is a 76 y.o. male seen at the request of Dr. 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 He is known to our clinic and received chemoRT as well as SBRT to an adrenal metastasis, and palliative therapy to his right obturator exturnus and adductor magnus. He completed consolidative Keytruda in November 2022.  ? ?Stable treated disease was noted by restaging scans in December 2022, and recent CT staging on 07/22/21 also showed interval enlargement in the left apical nodule measuring 8 mm, previously 5 mm, and slight increase in the small left greater than right pleural effusions. He has otherwise stable disease. He described pain in his posterior neck and a CT neck on 08/01/21 showed a 13 cm mass in the supraclavicular lymph node chain extending into the posterior neck involving the distal clavicle and intrinsic neck muscles. Given these findings, and limitations of additional chemotherapy due to CKD, he's seen to consider palliative radiotherapy to the left neck. ? ?PREVIOUS RADIOTHERAPY:  ? ?01/30/20-02/17/20: ?The right pelvic mass in the right obturator externus and adductor magnus was treated to 60 Gy in 15 fractions.  ? ?03/28/2019-04/07/2019 SBRT Treatment: ?The right adrenal gland target was treated to 50 Gy in 5 fractions.  ? ?01/31/19 - 03/16/19: ?The patient was treated to the disease within the  left lung including his left supraclavicular nodes initially to a dose of 60 Gy using a 4 field, 3-D conformal technique. The patient then received a cone down boost treatment for an additional 6 Gy. This yielded a final total dose of 66 Gy.  ? ? ? ?PAST MEDICAL HISTORY:  ?Past Medical History:  ?Diagnosis Date  ? Adenopathy   ? left supraclavicular lymph node  ? Allergies   ? COPD (chronic obstructive pulmonary disease) ([place])   ? Coronary disease   ? Diabetes ([place])   ? Enlarged prostate   ? GERD (gastroesophageal reflux disease)   ? Glaucoma   ? Hyperlipidemia   ? Hypertension   ? Myocardial infarction [facility])   ? nscl ca dx'd 12/2018  ? Sleep apnea   ? wears CPAP  ? Wears partial dentures   ? upper and lower  ?   ? ? ?PAST SURGICAL HISTORY: ?Past Surgical History:  ?Procedure Laterality Date  ? ABDOMINAL AORTAGRAM Left 09/16/2013  ? Procedure: ABDOMINAL AORTAGRAM;  Surgeon: Elam Dutch, MD;  Location: [facility] CATH LAB;  Service: Cardiovascular;  Laterality: Left;  ? CARDIAC CATHETERIZATION  01/2011  ? When there was segmental stenosis of the distal RCA, patent PCA stent, patent circumflex stent, and a patent small diagonal with 90% ISR.   ? COLONOSCOPY W/ BIOPSIES AND POLYPECTOMY    ? CORONARY ANGIOPLASTY  may 2002  ? Non-DES stenting of his circumflex, non-DES stenting of the RCA  ? HYDROCELE EXCISION / REPAIR  2009  ? by Dr Janice Norrie  ? INGUINAL HERNIA REPAIR Bilateral   ?  Dr Bubba Camp  ? LYMPH NODE BIOPSY Left 01/10/2019  ? Procedure: left supraclavicular LYMPH NODE BIOPSY;  Surgeon: [name], MD;  Location: [facility];  Service: Thoracic;  Laterality: Left;  ? MULTIPLE TOOTH EXTRACTIONS    ? NM MYOCAR PERF WALL MOTION  01/08/2011  ? moderate in size and intensity area of reversible ischemia in the basal to mid inferior and septal territories. Abnormal study  ? stents  2008  ? proximal RCA, DES for progession of disease.  ? US ECHOCARDIOGRAPHY  01/12/2012  ? mild concentric LVH, borderline LA enlargement, mild to mod TR   ? ? ? ?FAMILY HISTORY:  ?Family History  ?Problem Relation Age of Onset  ? Heart attack Mother   ? Heart disease Mother   ? Diabetes Father   ? Stroke Father   ? Hypertension Father   ? Hyperlipidemia Sister   ? Hypertension Sister   ? Breast cancer Sister   ? ? ? ?SOCIAL HISTORY:  reports that he has quit smoking. His smoking use included cigarettes. He has a 5.00 pack-year smoking history. He has never used smokeless tobacco. He reports current alcohol use. He reports that he does not use drugs. The patient is married and lives in [place].  ? ? ?ALLERGIES: Patient has no allergy information on record. ? ? ?MEDICATIONS:  ?Current Outpatient Medications  ?Medication Sig Dispense Refill  ? ACCU-CHEK GUIDE test strip     ? albuterol (VENTOLIN HFA) 108 (90 Base) MCG/ACT inhaler     ? amLODipine (NORVASC) 5 MG tablet TAKE 1 TABLET BY MOUTH  DAILY 90 tablet 3  ? aspirin EC 81 MG tablet Take 81 mg by mouth daily.    ? atorvastatin (LIPITOR) 80 MG tablet Take 80 mg by mouth daily.    ? budesonide-formoterol (SYMBICORT) 160-4.5 MCG/ACT inhaler Inhale 2 puffs into the lungs 2 (two) times daily.    ? ELIQUIS 5 MG TABS tablet TAKE 1 TABLET BY MOUTH  TWICE DAILY 180 tablet 3  ? ezetimibe (ZETIA) 10 MG tablet Take 10 mg by mouth daily.    ? finasteride (PROSCAR) 5 MG tablet Take 5 mg by mouth daily.     ? folic acid (FOLVITE) 1 MG tablet TAKE 1 TABLET BY MOUTH  DAILY 90 tablet 3  ? gabapentin (NEURONTIN) 300 MG capsule Take 300 mg by mouth 2 (two) times daily.    ? hydrochlorothiazide (HYDRODIURIL) 25 MG tablet Take 25 mg by mouth daily.     ? latanoprost (XALATAN) 0.005 % ophthalmic solution Place 1 drop into both eyes at bedtime.     ? metFORMIN (GLUCOPHAGE) 1000 MG tablet Take 1,000 mg by mouth 2 (two) times daily.     ? metoprolol tartrate (LOPRESSOR) 25 MG tablet Take 25 mg 2 (two) times daily by mouth.    ? mirtazapine (REMERON) 30 MG tablet TAKE 1 TABLET BY MOUTH AT  BEDTIME 90 tablet 3  ? Multiple Vitamin  (MULTIVITAMIN WITH MINERALS) TABS tablet Take 1 tablet by mouth daily. Centrum Silver    ? nitroGLYCERIN (NITROSTAT) 0.4 MG SL tablet Place 1 tablet (0.4 mg total) under the tongue every 5 (five) minutes as needed for chest pain. 25 tablet 6  ? ofloxacin (FLOXIN) 0.3 % OTIC solution     ? OVER THE COUNTER MEDICATION Take 1 tablet by mouth daily as needed. Colace    ? oxyCODONE (OXY IR/ROXICODONE) 5 MG immediate release tablet Take 1 tablet (5 mg total) by mouth every 4 (four) hours  as needed for severe pain. 120 tablet 0  ? pantoprazole (PROTONIX) 40 MG tablet Take 40 mg daily by mouth.    ? Pharmacist Choice Lancets MISC     ? Polyethyl Glycol-Propyl Glycol (SYSTANE) 0.4-0.3 % SOLN Place 1-2 drops into both eyes 3 (three) times daily as needed (dry/irritated eyes.).    ? prochlorperazine (COMPAZINE) 10 MG tablet Take 10 mg by mouth daily as needed for nausea or vomiting.    ? ramipril (ALTACE) 10 MG capsule Take 10 mg by mouth 2 (two) times daily.     ? Respiratory Therapy Supplies (CARETOUCH 2 CPAP HOSE HANGER) MISC See admin instructions.    ? ?No current facility-administered medications for this encounter.  ? ? ? ?REVIEW OF SYSTEMS: On review of systems, the patient reports that he is doing okay but his main complaint is related to his neck on the left side. He has noticed fullness and tightness of his skin especially in the last 2 weeks. Today in the shower he saw a small amount of blood on his washcloth from washing this area. He has not found gabapentin or hydrocodone to be helpful. He reports he has not been able to sleep in his bed for about the same amount of time and has to sleep in a recliner to get relief. No swelling of his chest or LUE are noted. He is not able to eat as well lately but denies frank pain with swallowing but certain tougher foods are hard to get down. No other complaints are verbalized.  ? ?PHYSICAL EXAM:  ?Wt Readings from Last 3 Encounters:  ?08/06/21 164 lb (74.4 kg)  ?08/05/21 164  lb 9 oz (74.6 kg)  ?07/25/21 165 lb (74.8 kg)  ? ?Temp Readings from Last 3 Encounters:  ?08/06/21 (!) 96 ?F (35.6 ?C) (Temporal)  ?08/05/21 (!) 97.5 ?F (36.4 ?C) (Tympanic)  ?07/25/21 (!) 97.4 ?F (36.3 ?C) (T

## 2021-08-07 ENCOUNTER — Ambulatory Visit
Admission: RE | Admit: 2021-08-07 | Discharge: 2021-08-07 | Disposition: A | Discharge status: Home / Self Care | Payer: Medicare Other | Source: Ambulatory Visit | Attending: Radiation Oncology | Admitting: Radiation Oncology

## 2021-08-07 ENCOUNTER — Other Ambulatory Visit: Payer: Self-pay

## 2021-08-07 DIAGNOSIS — Z87891 Personal history of nicotine dependence: Secondary | ICD-10-CM | POA: Diagnosis not present

## 2021-08-07 DIAGNOSIS — C770 Secondary and unspecified malignant neoplasm of lymph nodes of head, face and neck: Secondary | ICD-10-CM | POA: Diagnosis not present

## 2021-08-07 DIAGNOSIS — Z510 Encounter for antineoplastic radiation therapy: Secondary | ICD-10-CM | POA: Insufficient documentation

## 2021-08-07 DIAGNOSIS — C3412 Malignant neoplasm of upper lobe, left bronchus or lung: Secondary | ICD-10-CM | POA: Insufficient documentation

## 2021-08-08 ENCOUNTER — Ambulatory Visit: Payer: Medicare Other | Admitting: Internal Medicine

## 2021-08-08 DIAGNOSIS — R000 Tachycardia, unspecified: Secondary | ICD-10-CM | POA: Diagnosis not present

## 2021-08-08 DIAGNOSIS — C3490 Malignant neoplasm of unspecified part of unspecified bronchus or lung: Secondary | ICD-10-CM | POA: Diagnosis not present

## 2021-08-08 DIAGNOSIS — D649 Anemia, unspecified: Secondary | ICD-10-CM | POA: Diagnosis not present

## 2021-08-09 DIAGNOSIS — C3412 Malignant neoplasm of upper lobe, left bronchus or lung: Secondary | ICD-10-CM | POA: Diagnosis not present

## 2021-08-09 DIAGNOSIS — Z87891 Personal history of nicotine dependence: Secondary | ICD-10-CM | POA: Diagnosis not present

## 2021-08-09 DIAGNOSIS — C770 Secondary and unspecified malignant neoplasm of lymph nodes of head, face and neck: Secondary | ICD-10-CM | POA: Diagnosis not present

## 2021-08-12 DIAGNOSIS — Z510 Encounter for antineoplastic radiation therapy: Secondary | ICD-10-CM | POA: Diagnosis not present

## 2021-08-12 DIAGNOSIS — C3412 Malignant neoplasm of upper lobe, left bronchus or lung: Secondary | ICD-10-CM | POA: Diagnosis not present

## 2021-08-12 DIAGNOSIS — C770 Secondary and unspecified malignant neoplasm of lymph nodes of head, face and neck: Secondary | ICD-10-CM | POA: Diagnosis not present

## 2021-08-12 DIAGNOSIS — Z87891 Personal history of nicotine dependence: Secondary | ICD-10-CM | POA: Diagnosis not present

## 2021-08-13 ENCOUNTER — Ambulatory Visit: Payer: Medicare Other | Admitting: Radiation Oncology

## 2021-08-14 ENCOUNTER — Ambulatory Visit
Admission: RE | Admit: 2021-08-14 | Discharge: 2021-08-14 | Disposition: A | Discharge status: Home / Self Care | Payer: Medicare Other | Source: Ambulatory Visit | Attending: Radiation Oncology | Admitting: Radiation Oncology

## 2021-08-14 ENCOUNTER — Other Ambulatory Visit: Payer: Self-pay

## 2021-08-14 DIAGNOSIS — Z87891 Personal history of nicotine dependence: Secondary | ICD-10-CM | POA: Diagnosis not present

## 2021-08-14 DIAGNOSIS — C770 Secondary and unspecified malignant neoplasm of lymph nodes of head, face and neck: Secondary | ICD-10-CM | POA: Diagnosis not present

## 2021-08-14 DIAGNOSIS — Z510 Encounter for antineoplastic radiation therapy: Secondary | ICD-10-CM | POA: Diagnosis not present

## 2021-08-14 DIAGNOSIS — C3412 Malignant neoplasm of upper lobe, left bronchus or lung: Secondary | ICD-10-CM | POA: Diagnosis not present

## 2021-08-15 ENCOUNTER — Ambulatory Visit
Admission: RE | Admit: 2021-08-15 | Discharge: 2021-08-15 | Disposition: A | Discharge status: Home / Self Care | Payer: Medicare Other | Source: Ambulatory Visit | Attending: Radiation Oncology | Admitting: Radiation Oncology

## 2021-08-15 DIAGNOSIS — C3412 Malignant neoplasm of upper lobe, left bronchus or lung: Secondary | ICD-10-CM | POA: Diagnosis not present

## 2021-08-15 DIAGNOSIS — Z510 Encounter for antineoplastic radiation therapy: Secondary | ICD-10-CM | POA: Diagnosis not present

## 2021-08-15 DIAGNOSIS — C770 Secondary and unspecified malignant neoplasm of lymph nodes of head, face and neck: Secondary | ICD-10-CM | POA: Diagnosis not present

## 2021-08-15 DIAGNOSIS — Z87891 Personal history of nicotine dependence: Secondary | ICD-10-CM | POA: Diagnosis not present

## 2021-08-16 ENCOUNTER — Ambulatory Visit
Admission: RE | Admit: 2021-08-16 | Discharge: 2021-08-16 | Disposition: A | Discharge status: Home / Self Care | Payer: Medicare Other | Source: Ambulatory Visit | Attending: Radiation Oncology | Admitting: Radiation Oncology

## 2021-08-16 ENCOUNTER — Other Ambulatory Visit: Payer: Self-pay

## 2021-08-16 DIAGNOSIS — C3412 Malignant neoplasm of upper lobe, left bronchus or lung: Secondary | ICD-10-CM | POA: Diagnosis not present

## 2021-08-16 DIAGNOSIS — Z87891 Personal history of nicotine dependence: Secondary | ICD-10-CM | POA: Diagnosis not present

## 2021-08-16 DIAGNOSIS — Z510 Encounter for antineoplastic radiation therapy: Secondary | ICD-10-CM | POA: Diagnosis not present

## 2021-08-16 DIAGNOSIS — C770 Secondary and unspecified malignant neoplasm of lymph nodes of head, face and neck: Secondary | ICD-10-CM | POA: Diagnosis not present

## 2021-08-19 ENCOUNTER — Other Ambulatory Visit: Payer: Self-pay

## 2021-08-19 ENCOUNTER — Ambulatory Visit
Admission: RE | Admit: 2021-08-19 | Discharge: 2021-08-19 | Disposition: A | Discharge status: Home / Self Care | Payer: Medicare Other | Source: Ambulatory Visit | Attending: Radiation Oncology | Admitting: Radiation Oncology

## 2021-08-19 DIAGNOSIS — C770 Secondary and unspecified malignant neoplasm of lymph nodes of head, face and neck: Secondary | ICD-10-CM | POA: Insufficient documentation

## 2021-08-19 DIAGNOSIS — Z87891 Personal history of nicotine dependence: Secondary | ICD-10-CM | POA: Diagnosis not present

## 2021-08-19 DIAGNOSIS — Z510 Encounter for antineoplastic radiation therapy: Secondary | ICD-10-CM | POA: Insufficient documentation

## 2021-08-19 DIAGNOSIS — C3412 Malignant neoplasm of upper lobe, left bronchus or lung: Secondary | ICD-10-CM | POA: Insufficient documentation

## 2021-08-20 ENCOUNTER — Ambulatory Visit
Admission: RE | Admit: 2021-08-20 | Discharge: 2021-08-20 | Disposition: A | Discharge status: Home / Self Care | Payer: Medicare Other | Source: Ambulatory Visit | Attending: Radiation Oncology | Admitting: Radiation Oncology

## 2021-08-20 DIAGNOSIS — C770 Secondary and unspecified malignant neoplasm of lymph nodes of head, face and neck: Secondary | ICD-10-CM | POA: Diagnosis not present

## 2021-08-20 DIAGNOSIS — C3412 Malignant neoplasm of upper lobe, left bronchus or lung: Secondary | ICD-10-CM | POA: Diagnosis not present

## 2021-08-20 DIAGNOSIS — Z510 Encounter for antineoplastic radiation therapy: Secondary | ICD-10-CM | POA: Diagnosis not present

## 2021-08-20 DIAGNOSIS — Z87891 Personal history of nicotine dependence: Secondary | ICD-10-CM | POA: Diagnosis not present

## 2021-08-21 ENCOUNTER — Ambulatory Visit
Admission: RE | Admit: 2021-08-21 | Discharge: 2021-08-21 | Disposition: A | Discharge status: Home / Self Care | Payer: Medicare Other | Source: Ambulatory Visit | Attending: Radiation Oncology | Admitting: Radiation Oncology

## 2021-08-21 ENCOUNTER — Other Ambulatory Visit: Payer: Self-pay

## 2021-08-21 ENCOUNTER — Telehealth: Payer: Self-pay | Admitting: Medical Oncology

## 2021-08-21 DIAGNOSIS — Z510 Encounter for antineoplastic radiation therapy: Secondary | ICD-10-CM | POA: Diagnosis not present

## 2021-08-21 DIAGNOSIS — Z87891 Personal history of nicotine dependence: Secondary | ICD-10-CM | POA: Diagnosis not present

## 2021-08-21 DIAGNOSIS — C3412 Malignant neoplasm of upper lobe, left bronchus or lung: Secondary | ICD-10-CM | POA: Diagnosis not present

## 2021-08-21 DIAGNOSIS — C770 Secondary and unspecified malignant neoplasm of lymph nodes of head, face and neck: Secondary | ICD-10-CM | POA: Diagnosis not present

## 2021-08-21 NOTE — Telephone Encounter (Signed)
Pt was in the lobby for radiation appt. He reports that he has a lot of pain in his neck and he needs something for pain. Worthy Flank prescribed oxycodone 03/21. Message sent to her and Dr. Ida Rogue team. ?

## 2021-08-22 ENCOUNTER — Other Ambulatory Visit: Payer: Self-pay

## 2021-08-22 ENCOUNTER — Telehealth: Payer: Self-pay | Admitting: Medical Oncology

## 2021-08-22 ENCOUNTER — Other Ambulatory Visit: Payer: Self-pay | Admitting: Medical Oncology

## 2021-08-22 ENCOUNTER — Ambulatory Visit
Admission: RE | Admit: 2021-08-22 | Discharge: 2021-08-22 | Disposition: A | Discharge status: Home / Self Care | Payer: Medicare Other | Source: Ambulatory Visit | Attending: Radiation Oncology | Admitting: Radiation Oncology

## 2021-08-22 DIAGNOSIS — Y92009 Unspecified place in unspecified non-institutional (private) residence as the place of occurrence of the external cause: Secondary | ICD-10-CM | POA: Diagnosis not present

## 2021-08-22 DIAGNOSIS — C3412 Malignant neoplasm of upper lobe, left bronchus or lung: Secondary | ICD-10-CM | POA: Diagnosis not present

## 2021-08-22 DIAGNOSIS — T496X1A Poisoning by otorhinolaryngological drugs and preparations, accidental (unintentional), initial encounter: Secondary | ICD-10-CM | POA: Diagnosis not present

## 2021-08-22 DIAGNOSIS — Z87891 Personal history of nicotine dependence: Secondary | ICD-10-CM | POA: Diagnosis not present

## 2021-08-22 DIAGNOSIS — T50904A Poisoning by unspecified drugs, medicaments and biological substances, undetermined, initial encounter: Secondary | ICD-10-CM | POA: Diagnosis not present

## 2021-08-22 DIAGNOSIS — Z9989 Dependence on other enabling machines and devices: Secondary | ICD-10-CM | POA: Diagnosis not present

## 2021-08-22 DIAGNOSIS — Z803 Family history of malignant neoplasm of breast: Secondary | ICD-10-CM | POA: Diagnosis not present

## 2021-08-22 DIAGNOSIS — Z743 Need for continuous supervision: Secondary | ICD-10-CM | POA: Diagnosis not present

## 2021-08-22 DIAGNOSIS — I4891 Unspecified atrial fibrillation: Secondary | ICD-10-CM | POA: Diagnosis not present

## 2021-08-22 DIAGNOSIS — Z9861 Coronary angioplasty status: Secondary | ICD-10-CM | POA: Diagnosis not present

## 2021-08-22 DIAGNOSIS — D62 Acute posthemorrhagic anemia: Secondary | ICD-10-CM | POA: Diagnosis not present

## 2021-08-22 DIAGNOSIS — Z823 Family history of stroke: Secondary | ICD-10-CM | POA: Diagnosis not present

## 2021-08-22 DIAGNOSIS — Z955 Presence of coronary angioplasty implant and graft: Secondary | ICD-10-CM | POA: Diagnosis not present

## 2021-08-22 DIAGNOSIS — K269 Duodenal ulcer, unspecified as acute or chronic, without hemorrhage or perforation: Secondary | ICD-10-CM | POA: Diagnosis not present

## 2021-08-22 DIAGNOSIS — R197 Diarrhea, unspecified: Secondary | ICD-10-CM | POA: Diagnosis not present

## 2021-08-22 DIAGNOSIS — E119 Type 2 diabetes mellitus without complications: Secondary | ICD-10-CM | POA: Diagnosis not present

## 2021-08-22 DIAGNOSIS — C3492 Malignant neoplasm of unspecified part of left bronchus or lung: Secondary | ICD-10-CM | POA: Diagnosis not present

## 2021-08-22 DIAGNOSIS — K921 Melena: Secondary | ICD-10-CM | POA: Diagnosis not present

## 2021-08-22 DIAGNOSIS — G4733 Obstructive sleep apnea (adult) (pediatric): Secondary | ICD-10-CM | POA: Diagnosis not present

## 2021-08-22 DIAGNOSIS — K264 Chronic or unspecified duodenal ulcer with hemorrhage: Secondary | ICD-10-CM | POA: Diagnosis not present

## 2021-08-22 DIAGNOSIS — D500 Iron deficiency anemia secondary to blood loss (chronic): Secondary | ICD-10-CM | POA: Diagnosis present

## 2021-08-22 DIAGNOSIS — K922 Gastrointestinal hemorrhage, unspecified: Secondary | ICD-10-CM | POA: Diagnosis not present

## 2021-08-22 DIAGNOSIS — R578 Other shock: Secondary | ICD-10-CM | POA: Diagnosis not present

## 2021-08-22 DIAGNOSIS — Z923 Personal history of irradiation: Secondary | ICD-10-CM | POA: Diagnosis not present

## 2021-08-22 DIAGNOSIS — E1159 Type 2 diabetes mellitus with other circulatory complications: Secondary | ICD-10-CM | POA: Diagnosis not present

## 2021-08-22 DIAGNOSIS — Z7901 Long term (current) use of anticoagulants: Secondary | ICD-10-CM | POA: Diagnosis not present

## 2021-08-22 DIAGNOSIS — T550X1A Toxic effect of soaps, accidental (unintentional), initial encounter: Secondary | ICD-10-CM | POA: Diagnosis not present

## 2021-08-22 DIAGNOSIS — E1151 Type 2 diabetes mellitus with diabetic peripheral angiopathy without gangrene: Secondary | ICD-10-CM | POA: Diagnosis not present

## 2021-08-22 DIAGNOSIS — I10 Essential (primary) hypertension: Secondary | ICD-10-CM | POA: Diagnosis not present

## 2021-08-22 DIAGNOSIS — R531 Weakness: Secondary | ICD-10-CM | POA: Diagnosis not present

## 2021-08-22 DIAGNOSIS — J449 Chronic obstructive pulmonary disease, unspecified: Secondary | ICD-10-CM | POA: Diagnosis not present

## 2021-08-22 DIAGNOSIS — Z83438 Family history of other disorder of lipoprotein metabolism and other lipidemia: Secondary | ICD-10-CM | POA: Diagnosis not present

## 2021-08-22 DIAGNOSIS — I2510 Atherosclerotic heart disease of native coronary artery without angina pectoris: Secondary | ICD-10-CM | POA: Diagnosis not present

## 2021-08-22 DIAGNOSIS — G8929 Other chronic pain: Secondary | ICD-10-CM | POA: Diagnosis not present

## 2021-08-22 DIAGNOSIS — E876 Hypokalemia: Secondary | ICD-10-CM | POA: Diagnosis not present

## 2021-08-22 DIAGNOSIS — C770 Secondary and unspecified malignant neoplasm of lymph nodes of head, face and neck: Secondary | ICD-10-CM | POA: Diagnosis not present

## 2021-08-22 DIAGNOSIS — Z8249 Family history of ischemic heart disease and other diseases of the circulatory system: Secondary | ICD-10-CM | POA: Diagnosis not present

## 2021-08-22 DIAGNOSIS — Z510 Encounter for antineoplastic radiation therapy: Secondary | ICD-10-CM | POA: Diagnosis not present

## 2021-08-22 DIAGNOSIS — Z833 Family history of diabetes mellitus: Secondary | ICD-10-CM | POA: Diagnosis not present

## 2021-08-22 DIAGNOSIS — I480 Paroxysmal atrial fibrillation: Secondary | ICD-10-CM | POA: Diagnosis not present

## 2021-08-22 DIAGNOSIS — R64 Cachexia: Secondary | ICD-10-CM | POA: Diagnosis not present

## 2021-08-22 DIAGNOSIS — T50901A Poisoning by unspecified drugs, medicaments and biological substances, accidental (unintentional), initial encounter: Secondary | ICD-10-CM | POA: Diagnosis not present

## 2021-08-22 DIAGNOSIS — R112 Nausea with vomiting, unspecified: Secondary | ICD-10-CM | POA: Diagnosis not present

## 2021-08-22 DIAGNOSIS — L8992 Pressure ulcer of unspecified site, stage 2: Secondary | ICD-10-CM | POA: Diagnosis not present

## 2021-08-22 DIAGNOSIS — Z79899 Other long term (current) drug therapy: Secondary | ICD-10-CM | POA: Diagnosis not present

## 2021-08-22 NOTE — Telephone Encounter (Signed)
F/U Pain management- ? ?LVM for pt to call me or ask to speak to Dr [facility] nurse when he comes in for xrt. I also asked him to bring in his bottle of pain medication. ?

## 2021-08-23 ENCOUNTER — Other Ambulatory Visit: Payer: Self-pay | Admitting: Radiation Oncology

## 2021-08-23 ENCOUNTER — Ambulatory Visit
Admission: RE | Admit: 2021-08-23 | Discharge: 2021-08-23 | Disposition: A | Discharge status: Home / Self Care | Payer: Medicare Other | Source: Ambulatory Visit | Attending: Radiation Oncology | Admitting: Radiation Oncology

## 2021-08-23 DIAGNOSIS — C3412 Malignant neoplasm of upper lobe, left bronchus or lung: Secondary | ICD-10-CM | POA: Diagnosis not present

## 2021-08-23 DIAGNOSIS — Z87891 Personal history of nicotine dependence: Secondary | ICD-10-CM | POA: Diagnosis not present

## 2021-08-23 DIAGNOSIS — C770 Secondary and unspecified malignant neoplasm of lymph nodes of head, face and neck: Secondary | ICD-10-CM | POA: Diagnosis not present

## 2021-08-23 DIAGNOSIS — Z510 Encounter for antineoplastic radiation therapy: Secondary | ICD-10-CM | POA: Diagnosis not present

## 2021-08-23 MED ORDER — OXYCODONE HCL 5 MG PO TABS
5.0000 mg | ORAL_TABLET | Freq: Four times a day (QID) | ORAL | 0 refills | Status: DC | PRN
Start: 2021-08-23 — End: 2021-08-25

## 2021-08-24 ENCOUNTER — Inpatient Hospital Stay (HOSPITAL_COMMUNITY)
Admission: EM | Admit: 2021-08-24 | Discharge: 2021-08-29 | DRG: 378 | Disposition: A | Discharge status: Home / Self Care | Payer: Medicare Other | Attending: Internal Medicine | Admitting: Internal Medicine

## 2021-08-24 DIAGNOSIS — R64 Cachexia: Secondary | ICD-10-CM | POA: Diagnosis present

## 2021-08-24 DIAGNOSIS — Z7901 Long term (current) use of anticoagulants: Secondary | ICD-10-CM

## 2021-08-24 DIAGNOSIS — Z833 Family history of diabetes mellitus: Secondary | ICD-10-CM

## 2021-08-24 DIAGNOSIS — C3412 Malignant neoplasm of upper lobe, left bronchus or lung: Secondary | ICD-10-CM | POA: Diagnosis present

## 2021-08-24 DIAGNOSIS — G4730 Sleep apnea, unspecified: Secondary | ICD-10-CM | POA: Diagnosis present

## 2021-08-24 DIAGNOSIS — Z6822 Body mass index (BMI) 22.0-22.9, adult: Secondary | ICD-10-CM

## 2021-08-24 DIAGNOSIS — T6591XA Toxic effect of unspecified substance, accidental (unintentional), initial encounter: Principal | ICD-10-CM

## 2021-08-24 DIAGNOSIS — K264 Chronic or unspecified duodenal ulcer with hemorrhage: Principal | ICD-10-CM | POA: Diagnosis present

## 2021-08-24 DIAGNOSIS — T550X1A Toxic effect of soaps, accidental (unintentional), initial encounter: Secondary | ICD-10-CM | POA: Diagnosis present

## 2021-08-24 DIAGNOSIS — G8929 Other chronic pain: Secondary | ICD-10-CM | POA: Diagnosis present

## 2021-08-24 DIAGNOSIS — C770 Secondary and unspecified malignant neoplasm of lymph nodes of head, face and neck: Secondary | ICD-10-CM | POA: Diagnosis present

## 2021-08-24 DIAGNOSIS — E1151 Type 2 diabetes mellitus with diabetic peripheral angiopathy without gangrene: Secondary | ICD-10-CM | POA: Diagnosis present

## 2021-08-24 DIAGNOSIS — I4891 Unspecified atrial fibrillation: Secondary | ICD-10-CM | POA: Diagnosis present

## 2021-08-24 DIAGNOSIS — Z823 Family history of stroke: Secondary | ICD-10-CM

## 2021-08-24 DIAGNOSIS — D62 Acute posthemorrhagic anemia: Secondary | ICD-10-CM | POA: Diagnosis present

## 2021-08-24 DIAGNOSIS — Z803 Family history of malignant neoplasm of breast: Secondary | ICD-10-CM

## 2021-08-24 DIAGNOSIS — Y92009 Unspecified place in unspecified non-institutional (private) residence as the place of occurrence of the external cause: Secondary | ICD-10-CM

## 2021-08-24 DIAGNOSIS — T496X1A Poisoning by otorhinolaryngological drugs and preparations, accidental (unintentional), initial encounter: Secondary | ICD-10-CM | POA: Diagnosis present

## 2021-08-24 DIAGNOSIS — R197 Diarrhea, unspecified: Secondary | ICD-10-CM | POA: Diagnosis present

## 2021-08-24 DIAGNOSIS — Z8249 Family history of ischemic heart disease and other diseases of the circulatory system: Secondary | ICD-10-CM

## 2021-08-24 DIAGNOSIS — E876 Hypokalemia: Secondary | ICD-10-CM | POA: Diagnosis not present

## 2021-08-24 DIAGNOSIS — D500 Iron deficiency anemia secondary to blood loss (chronic): Secondary | ICD-10-CM

## 2021-08-24 DIAGNOSIS — Z83438 Family history of other disorder of lipoprotein metabolism and other lipidemia: Secondary | ICD-10-CM

## 2021-08-24 DIAGNOSIS — E119 Type 2 diabetes mellitus without complications: Secondary | ICD-10-CM | POA: Diagnosis present

## 2021-08-24 DIAGNOSIS — Z7951 Long term (current) use of inhaled steroids: Secondary | ICD-10-CM

## 2021-08-24 DIAGNOSIS — I10 Essential (primary) hypertension: Secondary | ICD-10-CM | POA: Diagnosis present

## 2021-08-24 DIAGNOSIS — Z923 Personal history of irradiation: Secondary | ICD-10-CM

## 2021-08-24 DIAGNOSIS — Z87891 Personal history of nicotine dependence: Secondary | ICD-10-CM

## 2021-08-24 DIAGNOSIS — I2510 Atherosclerotic heart disease of native coronary artery without angina pectoris: Secondary | ICD-10-CM | POA: Diagnosis present

## 2021-08-24 DIAGNOSIS — Z955 Presence of coronary angioplasty implant and graft: Secondary | ICD-10-CM

## 2021-08-24 DIAGNOSIS — Z79899 Other long term (current) drug therapy: Secondary | ICD-10-CM

## 2021-08-24 DIAGNOSIS — I252 Old myocardial infarction: Secondary | ICD-10-CM

## 2021-08-24 DIAGNOSIS — K922 Gastrointestinal hemorrhage, unspecified: Secondary | ICD-10-CM | POA: Diagnosis present

## 2021-08-24 NOTE — ED Notes (Signed)
Patient had a bowel movement upon arrival. Patient placed on bed pan, dark stools evaluated.   ?

## 2021-08-25 ENCOUNTER — Encounter (HOSPITAL_COMMUNITY): Payer: Self-pay | Admitting: Internal Medicine

## 2021-08-25 ENCOUNTER — Emergency Department (HOSPITAL_COMMUNITY): Payer: Medicare Other

## 2021-08-25 DIAGNOSIS — R197 Diarrhea, unspecified: Secondary | ICD-10-CM | POA: Diagnosis present

## 2021-08-25 DIAGNOSIS — G8929 Other chronic pain: Secondary | ICD-10-CM | POA: Diagnosis present

## 2021-08-25 DIAGNOSIS — D62 Acute posthemorrhagic anemia: Secondary | ICD-10-CM | POA: Diagnosis present

## 2021-08-25 DIAGNOSIS — Z83438 Family history of other disorder of lipoprotein metabolism and other lipidemia: Secondary | ICD-10-CM | POA: Diagnosis not present

## 2021-08-25 DIAGNOSIS — R64 Cachexia: Secondary | ICD-10-CM | POA: Diagnosis present

## 2021-08-25 DIAGNOSIS — Z79899 Other long term (current) drug therapy: Secondary | ICD-10-CM | POA: Diagnosis not present

## 2021-08-25 DIAGNOSIS — Z833 Family history of diabetes mellitus: Secondary | ICD-10-CM | POA: Diagnosis not present

## 2021-08-25 DIAGNOSIS — E1151 Type 2 diabetes mellitus with diabetic peripheral angiopathy without gangrene: Secondary | ICD-10-CM | POA: Diagnosis present

## 2021-08-25 DIAGNOSIS — Z923 Personal history of irradiation: Secondary | ICD-10-CM | POA: Diagnosis not present

## 2021-08-25 DIAGNOSIS — K922 Gastrointestinal hemorrhage, unspecified: Secondary | ICD-10-CM | POA: Diagnosis not present

## 2021-08-25 DIAGNOSIS — D500 Iron deficiency anemia secondary to blood loss (chronic): Secondary | ICD-10-CM | POA: Diagnosis present

## 2021-08-25 DIAGNOSIS — E876 Hypokalemia: Secondary | ICD-10-CM | POA: Diagnosis not present

## 2021-08-25 DIAGNOSIS — C3412 Malignant neoplasm of upper lobe, left bronchus or lung: Secondary | ICD-10-CM | POA: Diagnosis present

## 2021-08-25 DIAGNOSIS — Y92009 Unspecified place in unspecified non-institutional (private) residence as the place of occurrence of the external cause: Secondary | ICD-10-CM | POA: Diagnosis not present

## 2021-08-25 DIAGNOSIS — Z955 Presence of coronary angioplasty implant and graft: Secondary | ICD-10-CM | POA: Diagnosis not present

## 2021-08-25 DIAGNOSIS — K264 Chronic or unspecified duodenal ulcer with hemorrhage: Secondary | ICD-10-CM | POA: Diagnosis not present

## 2021-08-25 DIAGNOSIS — Z8249 Family history of ischemic heart disease and other diseases of the circulatory system: Secondary | ICD-10-CM | POA: Diagnosis not present

## 2021-08-25 DIAGNOSIS — I4891 Unspecified atrial fibrillation: Secondary | ICD-10-CM | POA: Diagnosis present

## 2021-08-25 DIAGNOSIS — T550X1A Toxic effect of soaps, accidental (unintentional), initial encounter: Secondary | ICD-10-CM | POA: Diagnosis present

## 2021-08-25 DIAGNOSIS — I10 Essential (primary) hypertension: Secondary | ICD-10-CM | POA: Diagnosis not present

## 2021-08-25 DIAGNOSIS — E119 Type 2 diabetes mellitus without complications: Secondary | ICD-10-CM | POA: Diagnosis present

## 2021-08-25 DIAGNOSIS — I2510 Atherosclerotic heart disease of native coronary artery without angina pectoris: Secondary | ICD-10-CM | POA: Diagnosis not present

## 2021-08-25 DIAGNOSIS — Z803 Family history of malignant neoplasm of breast: Secondary | ICD-10-CM | POA: Diagnosis not present

## 2021-08-25 DIAGNOSIS — Z7901 Long term (current) use of anticoagulants: Secondary | ICD-10-CM | POA: Diagnosis not present

## 2021-08-25 DIAGNOSIS — T496X1A Poisoning by otorhinolaryngological drugs and preparations, accidental (unintentional), initial encounter: Secondary | ICD-10-CM | POA: Diagnosis present

## 2021-08-25 DIAGNOSIS — Z823 Family history of stroke: Secondary | ICD-10-CM | POA: Diagnosis not present

## 2021-08-25 DIAGNOSIS — C770 Secondary and unspecified malignant neoplasm of lymph nodes of head, face and neck: Secondary | ICD-10-CM | POA: Diagnosis present

## 2021-08-25 LAB — GLUCOSE, CAPILLARY
Glucose-Capillary: 159 mg/dL — ABNORMAL HIGH (ref 70–99)
Glucose-Capillary: 163 mg/dL — ABNORMAL HIGH (ref 70–99)
Glucose-Capillary: 166 mg/dL — ABNORMAL HIGH (ref 70–99)

## 2021-08-25 LAB — COMPREHENSIVE METABOLIC PANEL
ALT: 14 U/L (ref 0–44)
AST: 16 U/L (ref 15–41)
Albumin: 1.9 g/dL — ABNORMAL LOW (ref 3.5–5.0)
Alkaline Phosphatase: 75 U/L (ref 38–126)
Anion gap: 13 (ref 5–15)
BUN: 75 mg/dL — ABNORMAL HIGH (ref 8–23)
CO2: 22 mmol/L (ref 22–32)
Calcium: 9.2 mg/dL (ref 8.9–10.3)
Chloride: 102 mmol/L (ref 98–111)
Creatinine, Ser: 1.2 mg/dL (ref 0.61–1.24)
GFR, Estimated: >60 mL/min (ref >60)
Glucose, Bld: 297 mg/dL — ABNORMAL HIGH (ref 70–99)
Potassium: 4.6 mmol/L (ref 3.5–5.1)
Sodium: 137 mmol/L (ref 135–145)
Total Bilirubin: 0.3 mg/dL (ref 0.3–1.2)
Total Protein: 5.9 g/dL — ABNORMAL LOW (ref 6.5–8.1)

## 2021-08-25 LAB — CBC WITH DIFFERENTIAL/PLATELET
Abs Immature Granulocytes: 0.09 10*3/uL — ABNORMAL HIGH (ref 0.00–0.07)
Basophils Absolute: 0 10*3/uL (ref 0.0–0.1)
Basophils Relative: 0 %
Eosinophils Absolute: 0 10*3/uL (ref 0.0–0.5)
Eosinophils Relative: 0 %
HCT: 14 % — ABNORMAL LOW (ref 39.0–52.0)
Hemoglobin: 4.1 g/dL — CL (ref 13.0–17.0)
Immature Granulocytes: 1 %
Lymphocytes Relative: 5 %
Lymphs Abs: 0.5 10*3/uL — ABNORMAL LOW (ref 0.7–4.0)
MCH: 24.4 pg — ABNORMAL LOW (ref 26.0–34.0)
MCHC: 29.3 g/dL — ABNORMAL LOW (ref 30.0–36.0)
MCV: 83.3 fL (ref 80.0–100.0)
Monocytes Absolute: 0.9 10*3/uL (ref 0.1–1.0)
Monocytes Relative: 8 %
Neutro Abs: 9.9 10*3/uL — ABNORMAL HIGH (ref 1.7–7.7)
Neutrophils Relative %: 86 %
Platelets: 580 10*3/uL — ABNORMAL HIGH (ref 150–400)
RBC: 1.68 MIL/uL — ABNORMAL LOW (ref 4.22–5.81)
RDW: 18.6 % — ABNORMAL HIGH (ref 11.5–15.5)
WBC: 11.4 10*3/uL — ABNORMAL HIGH (ref 4.0–10.5)
nRBC: 0.6 % — ABNORMAL HIGH (ref 0.0–0.2)

## 2021-08-25 LAB — CBC
HCT: 22.6 % — ABNORMAL LOW (ref 39.0–52.0)
HCT: 22.6 % — ABNORMAL LOW (ref 39.0–52.0)
Hemoglobin: 7.3 g/dL — ABNORMAL LOW (ref 13.0–17.0)
Hemoglobin: 7.1 g/dL — ABNORMAL LOW (ref 13.0–17.0)
MCH: 27.8 pg (ref 26.0–34.0)
MCH: 26.9 pg (ref 26.0–34.0)
MCHC: 32.3 g/dL (ref 30.0–36.0)
MCHC: 31.4 g/dL (ref 30.0–36.0)
MCV: 85.9 fL (ref 80.0–100.0)
MCV: 85.6 fL (ref 80.0–100.0)
Platelets: 534 10*3/uL — ABNORMAL HIGH (ref 150–400)
Platelets: 545 10*3/uL — ABNORMAL HIGH (ref 150–400)
RBC: 2.63 MIL/uL — ABNORMAL LOW (ref 4.22–5.81)
RBC: 2.64 MIL/uL — ABNORMAL LOW (ref 4.22–5.81)
RDW: 17.4 % — ABNORMAL HIGH (ref 11.5–15.5)
RDW: 17.3 % — ABNORMAL HIGH (ref 11.5–15.5)
WBC: 11.5 10*3/uL — ABNORMAL HIGH (ref 4.0–10.5)
WBC: 11.6 10*3/uL — ABNORMAL HIGH (ref 4.0–10.5)
nRBC: 1 % — ABNORMAL HIGH (ref 0.0–0.2)
nRBC: 1.3 % — ABNORMAL HIGH (ref 0.0–0.2)

## 2021-08-25 LAB — ABO/RH: ABO/RH(D): A POS

## 2021-08-25 LAB — HEMOGLOBIN A1C
Hgb A1c MFr Bld: 6.9 % — ABNORMAL HIGH (ref 4.8–5.6)
Mean Plasma Glucose: 151.33 mg/dL

## 2021-08-25 LAB — CBG MONITORING, ED
Glucose-Capillary: 254 mg/dL — ABNORMAL HIGH (ref 70–99)
Glucose-Capillary: 169 mg/dL — ABNORMAL HIGH (ref 70–99)
Glucose-Capillary: 163 mg/dL — ABNORMAL HIGH (ref 70–99)

## 2021-08-25 LAB — POC OCCULT BLOOD, ED: Fecal Occult Bld: POSITIVE — AB

## 2021-08-25 LAB — PREPARE RBC (CROSSMATCH)

## 2021-08-25 MED ORDER — MORPHINE SULFATE (PF) 2 MG/ML IV SOLN
0.5000 mg | INTRAVENOUS | Status: DC | PRN
  Administered 2021-08-25 – 2021-08-28 (×5): 0.5 mg via INTRAVENOUS
  Filled 2021-08-25 (×5): qty 1

## 2021-08-25 MED ORDER — LATANOPROST 0.005 % OP SOLN
1.0000 [drp] | Freq: Every day | OPHTHALMIC | Status: DC
  Administered 2021-08-25 – 2021-08-28 (×4): 1 [drp] via OPHTHALMIC
  Filled 2021-08-25: qty 2.5

## 2021-08-25 MED ORDER — PANTOPRAZOLE SODIUM 40 MG IV SOLR
40.0000 mg | Freq: Once | INTRAVENOUS | Status: DC
  Filled 2021-08-25: qty 10

## 2021-08-25 MED ORDER — PANTOPRAZOLE SODIUM 40 MG IV SOLR: 40.0000 mg | Freq: Once | INTRAVENOUS | Status: DC

## 2021-08-25 MED ORDER — PANTOPRAZOLE INFUSION (NEW) - SIMPLE MED
8.0000 mg/h | INTRAVENOUS | Status: DC
  Administered 2021-08-25 – 2021-08-27 (×4): 8 mg/h via INTRAVENOUS
  Filled 2021-08-25: qty 80
  Filled 2021-08-25: qty 100
  Filled 2021-08-25: qty 80
  Filled 2021-08-25 (×2): qty 100
  Filled 2021-08-25: qty 80

## 2021-08-25 MED ORDER — PANTOPRAZOLE SODIUM 40 MG IV SOLR
40.0000 mg | Freq: Two times a day (BID) | INTRAVENOUS | Status: DC
Start: 2021-08-28 — End: 2021-08-27

## 2021-08-25 MED ORDER — DEXTROSE 5 % IV SOLN: INTRAVENOUS | Status: DC

## 2021-08-25 MED ORDER — INSULIN ASPART 100 UNIT/ML IJ SOLN
0.0000 [IU] | INTRAMUSCULAR | Status: DC
  Administered 2021-08-25 (×4): 1 [IU] via SUBCUTANEOUS
  Administered 2021-08-25: 3 [IU] via SUBCUTANEOUS
  Administered 2021-08-26 (×2): 1 [IU] via SUBCUTANEOUS
  Administered 2021-08-26 – 2021-08-27 (×2): 2 [IU] via SUBCUTANEOUS
  Administered 2021-08-27 – 2021-08-28 (×4): 1 [IU] via SUBCUTANEOUS

## 2021-08-25 MED ORDER — SODIUM CHLORIDE 0.9 % IV SOLN
10.0000 mL/h | Freq: Once | INTRAVENOUS | Status: AC
Start: 2021-08-25 — End: 2021-08-25
  Administered 2021-08-25: 10 mL/h via INTRAVENOUS

## 2021-08-25 MED ORDER — PANTOPRAZOLE 80MG IVPB - SIMPLE MED
80.0000 mg | Freq: Once | INTRAVENOUS | Status: AC
  Administered 2021-08-25: 80 mg via INTRAVENOUS
  Filled 2021-08-25: qty 100

## 2021-08-25 NOTE — ED Provider Notes (Addendum)
?[facility] ?Provider Note ? ?CSN: [id number] ?Arrival date & time: 08/24/21 2328 ? ?Chief Complaint(s) ?Ingestion (Pt was diagnosed with lung cancer 5 weeks and has chronic pain, reports tonight he was in "so much pain that I drunk hand soap and mouthwash so I would stop hurting" denies SI/HI. ) and Diarrhea (After drinking the hand soap pt reports "diarrhea" ) ? ?HPI ?Trevor Bailey is a 76 y.o. male   ? ? ?Ingestion ?This is a new problem. The current episode started 1 to 2 hours ago. Episode frequency: once. Pertinent negatives include no chest pain, no abdominal pain, no headaches and no shortness of breath. Nothing aggravates the symptoms. Nothing relieves the symptoms.  ?Diarrhea ?Quality:  Copious ?Severity:  Moderate ?Onset quality:  Gradual ?Timing:  Intermittent ?Associated symptoms: no abdominal pain, no fever, no headaches and no vomiting   ?Risk factors comment:  Currently getting chemo for metastatic lung cancer ? ?Wife reports that his memory and confusion has been gradually worsening over the past several weeks. No known trauma or infections.  ? ?Past Medical History ?Past Medical History:  ?Diagnosis Date  ? Adenopathy   ? left supraclavicular lymph node  ? Allergies   ? COPD (chronic obstructive pulmonary disease) ([place])   ? Coronary disease   ? Diabetes ([place])   ? Enlarged prostate   ? GERD (gastroesophageal reflux disease)   ? Glaucoma   ? Hyperlipidemia   ? Hypertension   ? Myocardial infarction [facility])   ? nscl ca dx'd 12/2018  ? Sleep apnea   ? wears CPAP  ? Wears partial dentures   ? upper and lower  ? ?Patient Active Problem List  ? Diagnosis Date Noted  ? Acute GI bleeding 08/25/2021  ? Acute blood loss anemia 08/25/2021  ? 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 Neutropenia, drug-induced ([place]) 05/18/2019  ? Secondary malignant neoplasm of right adrenal gland ([place]) 04/19/2019  ? Encounter for antineoplastic immunotherapy 04/11/2019  ?  Primary malignant neoplasm of bronchus of left upper lobe ([place]) 01/13/2019  ? Goals of care, counseling/discussion 01/13/2019  ? Encounter for antineoplastic chemotherapy 01/13/2019  ? Mass of upper lobe of left lung 12/23/2018  ? Chest pain with moderate risk of acute coronary syndrome 03/12/2016  ? Type 2 diabetes mellitus ([place]) 10/07/2015  ? PVD (peripheral vascular disease) ([place]) 02/23/2014  ? Premature atrial contractions 02/09/2014  ? Atherosclerosis of native artery of extremity with intermittent claudication ([place] ) 09/08/2013  ? CAD S/P percutaneous coronary angioplasty 07/04/2013  ? OSA on CPAP 07/04/2013  ? Essential hypertension 07/04/2013  ? Tobacco abuse 07/04/2013  ? Hyperlipidemia with target LDL less than 70 07/04/2013  ? ?Home Medication(s) ?Prior to Admission medications   ?Medication Sig Start Date End Date Taking? Authorizing Provider  ?ACCU-CHEK GUIDE test strip  11/09/19   [provider]  ?albuterol (VENTOLIN HFA) 108 (90 Base) MCG/ACT inhaler  05/10/20   [provider]  ?amLODipine (NORVASC) 5 MG tablet TAKE 1 TABLET BY MOUTH  DAILY 01/29/21   Troy Sine, MD  ?aspirin EC 81 MG tablet Take 81 mg by mouth daily.    [provider]  ?atorvastatin (LIPITOR) 80 MG tablet Take 80 mg by mouth daily.    [provider]  ?budesonide-formoterol (SYMBICORT) 160-4.5 MCG/ACT inhaler Inhale 2 puffs into the lungs 2 (two) times daily.    [name], MD  ?ELIQUIS 5 MG TABS tablet TAKE 1 TABLET BY MOUTH  TWICE DAILY  07/29/21   Troy Sine, MD  ?ezetimibe (ZETIA) 10 MG tablet Take 10 mg by mouth daily.    [provider]  ?finasteride (PROSCAR) 5 MG tablet Take 5 mg by mouth daily.  02/18/16   [provider]  ?folic acid (FOLVITE) 1 MG tablet TAKE 1 TABLET BY MOUTH  DAILY 10/24/20   Curt Bears, MD  ?gabapentin (NEURONTIN) 300 MG capsule Take 300 mg by mouth 2 (two) times daily.    [name], MD  ?hydrochlorothiazide (HYDRODIURIL) 25 MG  tablet Take 25 mg by mouth daily.  05/25/13   [provider]  ?latanoprost (XALATAN) 0.005 % ophthalmic solution Place 1 drop into both eyes at bedtime.  08/09/18   [provider]  ?metFORMIN (GLUCOPHAGE) 1000 MG tablet Take 1,000 mg by mouth 2 (two) times daily.  05/25/13   [provider]  ?metoprolol tartrate (LOPRESSOR) 25 MG tablet Take 25 mg 2 (two) times daily by mouth.    [provider]  ?mirtazapine (REMERON) 30 MG tablet TAKE 1 TABLET BY MOUTH AT  BEDTIME 09/19/20   Curt Bears, MD  ?Multiple Vitamin (MULTIVITAMIN WITH MINERALS) TABS tablet Take 1 tablet by mouth daily. Centrum Silver    [provider]  ?nitroGLYCERIN (NITROSTAT) 0.4 MG SL tablet Place 1 tablet (0.4 mg total) under the tongue every 5 (five) minutes as needed for chest pain. 05/04/18   Troy Sine, MD  ?ofloxacin (FLOXIN) 0.3 % OTIC solution  11/04/19   [provider]  ?OVER THE COUNTER MEDICATION Take 1 tablet by mouth daily as needed. Colace    [provider]  ?oxyCODONE (OXY IR/ROXICODONE) 5 MG immediate release tablet Take 1-2 tablets (5-10 mg total) by mouth every 6 (six) hours as needed for severe pain. 08/23/21   Kyung Rudd, MD  ?pantoprazole (PROTONIX) 40 MG tablet Take 40 mg daily by mouth.    [provider]  ?Pharmacist Choice Lancets [place]  07/30/20   [provider]  ?Polyethyl Glycol-Propyl Glycol (SYSTANE) 0.4-0.3 % SOLN Place 1-2 drops into both eyes 3 (three) times daily as needed (dry/irritated eyes.).    [provider]  ?prochlorperazine (COMPAZINE) 10 MG tablet Take 10 mg by mouth daily as needed for nausea or vomiting.    [provider]  ?ramipril (ALTACE) 10 MG capsule Take 10 mg by mouth 2 (two) times daily.  05/25/13   [provider]  ?Respiratory Therapy Supplies (CARETOUCH 2 CPAP HOSE HANGER) MISC See admin instructions. 07/29/18   [provider]  ?                                                                                                                                   ?Allergies ?Patient has no allergy information on record. ? ?Review of Systems ?Review of Systems  ?Constitutional:  Negative for fever.  ?Respiratory:  Negative for shortness of breath.   ?Cardiovascular:  Negative  for chest pain.  ?Gastrointestinal:  Positive for diarrhea. Negative for abdominal pain and vomiting.  ?Neurological:  Negative for headaches.  ?As noted in HPI ? ?Physical Exam ?Vital Signs  ?I have reviewed the triage vital signs ?BP 115/76   Pulse (!) 118   Temp 98.3 ?F (36.8 ?C) (Oral)   Resp 16   Ht 5\' 11"  (1.803 m)   Wt 72.6 kg   SpO2 99%   BMI 22.32 kg/m?  ? ?Physical Exam ?Vitals reviewed.  ?Constitutional:   ?   General: He is not in acute distress. ?   Appearance: He is well-developed. He is not diaphoretic.  ?HENT:  ?   Head: Normocephalic and atraumatic.  ?   Right Ear: External ear normal.  ?   Left Ear: External ear normal.  ?   Nose: Nose normal.  ?   Mouth/Throat:  ?   Mouth: Mucous membranes are moist.  ?Eyes:  ?   General: No scleral icterus. ?   Conjunctiva/sclera: Conjunctivae normal.  ?   Comments: Pale conjunctiva  ?Neck:  ?   Trachea: Phonation normal.  ?Cardiovascular:  ?   Rate and Rhythm: Regular rhythm. Tachycardia present.  ?Pulmonary:  ?   Effort: Pulmonary effort is normal. No respiratory distress.  ?   Breath sounds: No stridor.  ?Abdominal:  ?   General: There is no distension.  ?   Tenderness: There is no abdominal tenderness.  ?Genitourinary: ?   Rectum: Guaiac result positive.  ?Musculoskeletal:     ?   General: Normal range of motion.  ?   Cervical back: Normal range of motion.  ?Neurological:  ?   Mental Status: He is alert and oriented to person, place, and time.  ?Psychiatric:     ?   Behavior: Behavior normal.  ? ? ?ED Results and Treatments ?Labs ?(all labs ordered are listed, but only abnormal results are displayed) ?Labs Reviewed  ?COMPREHENSIVE METABOLIC PANEL - Abnormal;  Notable for the following components:  ?    Result Value  ? Glucose, Bld 297 (*)   ? BUN 75 (*)   ? Total Protein 5.9 (*)   ? Albumin 1.9 (*)   ? All other components within normal limits  ?CBC WITH DIFFERENTIAL

## 2021-08-25 NOTE — H&P (Signed)
?History and Physical  ? ? ?Trevor Bailey KXF:[id number] DOB: 1946-03-12 DOA: 08/24/2021 ? ?PCP: [name], MD  ?Patient coming from: Home. ? ?Chief Complaint: Ingested accidentally hand soap. ? ?HPI: Trevor Bailey is a 76 y.o. male with history of CAD status post stenting, diabetes mellitus type 2, sleep apnea, atrial fibrillation on Eliquis, stage IV non-small cell lung cancer on chemoradiation last radiation was about a week ago has been experiencing increasing pain around the radiation area in the neck and patient while sitting on the table at his house inadvertently drank a bottle containing hand soap or mouthwash not clear.  And was brought to the ER.  Denies any abdominal pain chest pain or shortness of breath.  Patient denies taking any NSAIDs.  Patient does not recall the last dose of Eliquis. ? ?ED Course: In the ER patient had bowel movement which showed dark and tarry.  Hemoglobin checked was 4.1 stool for occult blood was positive.  Patient was tachycardic but blood pressure was 696 systolic.  Patient was started on Protonix infusion and 2 units of PRBC transfusion has been ordered.  Patient admitted for acute GI bleed with acute blood loss anemia.  Since patient appears mildly confused CT head was done which did not show anything acute. ? ?Review of Systems: As per HPI, rest all negative. ? ? ?Past Medical History:  ?Diagnosis Date  ? Adenopathy   ? left supraclavicular lymph node  ? Allergies   ? COPD (chronic obstructive pulmonary disease) ([place])   ? Coronary disease   ? Diabetes ([place])   ? Enlarged prostate   ? GERD (gastroesophageal reflux disease)   ? Glaucoma   ? Hyperlipidemia   ? Hypertension   ? Myocardial infarction [facility])   ? nscl ca dx'd 12/2018  ? Sleep apnea   ? wears CPAP  ? Wears partial dentures   ? upper and lower  ? ? ?Past Surgical History:  ?Procedure Laterality Date  ? ABDOMINAL AORTAGRAM Left 09/16/2013  ? Procedure: ABDOMINAL AORTAGRAM;  Surgeon: Elam Dutch, MD;  Location[facility]  CATH LAB;  Service: Cardiovascular;  Laterality: Left;  ? CARDIAC CATHETERIZATION  01/2011  ? When there was segmental stenosis of the distal RCA, patent PCA stent, patent circumflex stent, and a patent small diagonal with 90% ISR.   ? COLONOSCOPY W/ BIOPSIES AND POLYPECTOMY    ? CORONARY ANGIOPLASTY  may 2002  ? Non-DES stenting of his circumflex, non-DES stenting of the RCA  ? HYDROCELE EXCISION / REPAIR  2009  ? by Dr Janice Norrie  ? INGUINAL HERNIA REPAIR Bilateral   ? Dr Bubba Camp  ? LYMPH NODE BIOPSY Left 01/10/2019  ? Procedure: left supraclavicular LYMPH NODE BIOPSY;  Surgeon: [name], MD;  Location: [facility];  Service: Thoracic;  Laterality: Left;  ? MULTIPLE TOOTH EXTRACTIONS    ? NM MYOCAR PERF WALL MOTION  01/08/2011  ? moderate in size and intensity area of reversible ischemia in the basal to mid inferior and septal territories. Abnormal study  ? stents  2008  ? proximal RCA, DES for progession of disease.  ? US ECHOCARDIOGRAPHY  01/12/2012  ? mild concentric LVH, borderline LA enlargement, mild to mod TR  ? ? ? reports that he has quit smoking. His smoking use included cigarettes. He has a 5.00 pack-year smoking history. He has never used smokeless tobacco. He reports current alcohol use. He reports that he does not use drugs. ? ?Not on File ? ?Family History  ?  Problem Relation Age of Onset  ? Heart attack Mother   ? Heart disease Mother   ? Diabetes Father   ? Stroke Father   ? Hypertension Father   ? Hyperlipidemia Sister   ? Hypertension Sister   ? Breast cancer Sister   ? ? ?Prior to Admission medications   ?Medication Sig Start Date End Date Taking? Authorizing Provider  ?ACCU-CHEK GUIDE test strip  11/09/19   [provider]  ?albuterol (VENTOLIN HFA) 108 (90 Base) MCG/ACT inhaler  05/10/20   [provider]  ?amLODipine (NORVASC) 5 MG tablet TAKE 1 TABLET BY MOUTH  DAILY 01/29/21   Troy Sine, MD  ?aspirin EC 81 MG tablet Take 81 mg by mouth daily.    [provider]   ?atorvastatin (LIPITOR) 80 MG tablet Take 80 mg by mouth daily.    [provider]  ?budesonide-formoterol (SYMBICORT) 160-4.5 MCG/ACT inhaler Inhale 2 puffs into the lungs 2 (two) times daily.    [name], MD  ?Ar[place] 5 MG TABS tablet TAKE 1 TABLET BY MOUTH  TWICE DAILY 07/29/21   Troy Sine, MD  ?ezetimibe (ZETIA) 10 MG tablet Take 10 mg by mouth daily.    [provider]  ?finasteride (PROSCAR) 5 MG tablet Take 5 mg by mouth daily.  02/18/16   [provider]  ?folic acid (FOLVITE) 1 MG tablet TAKE 1 TABLET BY MOUTH  DAILY 10/24/20   Curt Bears, MD  ?gabapentin (NEURONTIN) 300 MG capsule Take 300 mg by mouth 2 (two) times daily.    [name], MD  ?hydrochlorothiazide (HYDRODIURIL) 25 MG tablet Take 25 mg by mouth daily.  05/25/13   [provider]  ?latanoprost (XALATAN) 0.005 % ophthalmic solution Place 1 drop into both eyes at bedtime.  08/09/18   [provider]  ?metFORMIN (GLUCOPHAGE) 1000 MG tablet Take 1,000 mg by mouth 2 (two) times daily.  05/25/13   [provider]  ?metoprolol tartrate (LOPRESSOR) 25 MG tablet Take 25 mg 2 (two) times daily by mouth.    [provider]  ?mirtazapine (REMERON) 30 MG tablet TAKE 1 TABLET BY MOUTH AT  BEDTIME 09/19/20   Curt Bears, MD  ?Multiple Vitamin (MULTIVITAMIN WITH MINERALS) TABS tablet Take 1 tablet by mouth daily. Centrum Silver    [provider]  ?nitroGLYCERIN (NITROSTAT) 0.4 MG SL tablet Place 1 tablet (0.4 mg total) under the tongue every 5 (five) minutes as needed for chest pain. 05/04/18   Troy Sine, MD  ?ofloxacin (FLOXIN) 0.3 % OTIC solution  11/04/19   [provider]  ?OVER THE COUNTER MEDICATION Take 1 tablet by mouth daily as needed. Colace    [provider]  ?oxyCODONE (OXY IR/ROXICODONE) 5 MG immediate release tablet Take 1-2 tablets (5-10 mg total) by mouth every 6 (six) hours as needed for severe pain. 08/23/21   Kyung Rudd, MD   ?pantoprazole (PROTONIX) 40 MG tablet Take 40 mg daily by mouth.    [provider]  ?Pharmacist Choice Lancets [place]  07/30/20   [provider]  ?Polyethyl Glycol-Propyl Glycol (SYSTANE) 0.4-0.3 % SOLN Place 1-2 drops into both eyes 3 (three) times daily as needed (dry/irritated eyes.).    [provider]  ?prochlorperazine (COMPAZINE) 10 MG tablet Take 10 mg by mouth daily as needed for nausea or vomiting.    [provider]  ?ramipril (ALTACE) 10 MG capsule Take 10 mg by mouth 2 (two) times daily.  05/25/13   [provider]  ?Respiratory Therapy Supplies (CARETOUCH 2 CPAP HOSE HANGER) MISC See admin instructions. 07/29/18   [provider]  ? ? ?Physical Exam: ?Constitutional: Moderately built and nourished. ?Vitals:  ? 08/25/21 0500 08/25/21 0529 08/25/21 0530 08/25/21 0545  ?BP: 129/73 129/73  128/76  ?Pulse:  (!) 123  (!) 118  ?Resp: (!) 22 (!) 21  15  ?Temp:  98.3 ?F (36.8 ?C)  98.3 ?F (36.8 ?C)  ?TempSrc:  Oral Oral Oral  ?SpO2:  100%  99%  ?Weight:      ?Height:      ? ?Eyes: Anicteric no pallor. ?ENMT: No discharge from the ears eyes nose and mouth. ?Neck: No mass felt.  No neck rigidity. ?Respiratory: No rhonchi or crepitations. ?Cardiovascular: S1-S2 heard. ?Abdomen: Soft nontender bowel sound present. ?Musculoskeletal: No edema. ?Skin: Radiation scar on the neck. ?Neurologic: Alert awake oriented to his name and place.  Moving all extremities. ?Psychiatric: Oriented to his name and place. ? ? ?Labs on Admission: I have personally reviewed following labs and imaging studies ? ?CBC: ?Recent Labs  ?Lab 08/25/21 ?0322  ?WBC 11.4*  ?NEUTROABS 9.9*  ?HGB 4.1*  ?HCT 14.0*  ?MCV 83.3  ?PLT 580*  ? ?Basic Metabolic Panel: ?Recent Labs  ?Lab 08/25/21 ?0230  ?NA 137  ?K 4.6  ?CL 102  ?CO2 22  ?GLUCOSE 297*  ?BUN 75*  ?CREATININE 1.20  ?CALCIUM 9.2  ? ?GFR: ?Estimated Creatinine Clearance: 53.8 mL/min (by C-G formula based on SCr of 1.2 mg/dL). ?Liver Function  Tests: ?Recent Labs  ?Lab 08/25/21 ?0230  ?AST 16  ?ALT 14  ?ALKPHOS 75  ?BILITOT 0.3  ?PROT 5.9*  ?ALBUMIN 1.9*  ? ?No results for input(s): LIPASE, AMYLASE in the last 168 hours. ?No results for input(s): AMM

## 2021-08-25 NOTE — Consult Note (Signed)
Referring Provider: Dr. Hal Hope ?Primary Care Physician:  [name], MD ?Primary Gastroenterologist:  Dr. Oletta Lamas ? ?Reason for Consultation:  Melena ? ?HPI: Trevor Bailey is a 76 y.o. male with acute onset of melena X 1 last night without abdominal pain, dizziness, N/V, or hematochezia. Hgb 4.1 (8.2 on 08/08/21). Tachycardic with normotensive in ER. Patient has been on chemoradiation for Stage IV non-small cell lung cancer reportedly for the last 2 weeks per his wife. She gives most of the history and says he has not been on his Eliquis for a week. Was on occasional NSAIDs but denies any for the past 2 weeks. Remote history of occasional alcohol. Last colonoscopy 07/2016 where 2 small hyperplastic polyps were removed. ? ?Past Medical History:  ?Diagnosis Date  ? Adenopathy   ? left supraclavicular lymph node  ? Allergies   ? COPD (chronic obstructive pulmonary disease) ([place])   ? Coronary disease   ? Diabetes ([place])   ? Enlarged prostate   ? GERD (gastroesophageal reflux disease)   ? Glaucoma   ? Hyperlipidemia   ? Hypertension   ? Myocardial infarction [facility])   ? nscl ca dx'd 12/2018  ? Sleep apnea   ? wears CPAP  ? Wears partial dentures   ? upper and lower  ? ? ?Past Surgical History:  ?Procedure Laterality Date  ? ABDOMINAL AORTAGRAM Left 09/16/2013  ? Procedure: ABDOMINAL AORTAGRAM;  Surgeon: Elam Dutch, MD;  Location: [facility] CATH LAB;  Service: Cardiovascular;  Laterality: Left;  ? CARDIAC CATHETERIZATION  01/2011  ? When there was segmental stenosis of the distal RCA, patent PCA stent, patent circumflex stent, and a patent small diagonal with 90% ISR.   ? COLONOSCOPY W/ BIOPSIES AND POLYPECTOMY    ? CORONARY ANGIOPLASTY  may 2002  ? Non-DES stenting of his circumflex, non-DES stenting of the RCA  ? HYDROCELE EXCISION / REPAIR  2009  ? by Dr Janice Norrie  ? INGUINAL HERNIA REPAIR Bilateral   ? Dr Bubba Camp  ? LYMPH NODE BIOPSY Left 01/10/2019  ? Procedure: left supraclavicular LYMPH NODE BIOPSY;  Surgeon: [name], MD;  Location: [facility];  Service: Thoracic;  Laterality: Left;  ? MULTIPLE TOOTH EXTRACTIONS    ? NM MYOCAR PERF WALL MOTION  01/08/2011  ? moderate in size and intensity area of reversible ischemia in the basal to mid inferior and septal territories. Abnormal study  ? stents  2008  ? proximal RCA, DES for progession of disease.  ? US ECHOCARDIOGRAPHY  01/12/2012  ? mild concentric LVH, borderline LA enlargement, mild to mod TR  ? ? ?Prior to Admission medications   ?Medication Sig Start Date End Date Taking? Authorizing Provider  ?ACCU-CHEK GUIDE test strip  11/09/19   [provider]  ?albuterol (VENTOLIN HFA) 108 (90 Base) MCG/ACT inhaler  05/10/20   [provider]  ?amLODipine (NORVASC) 5 MG tablet TAKE 1 TABLET BY MOUTH  DAILY 01/29/21   Troy Sine, MD  ?aspirin EC 81 MG tablet Take 81 mg by mouth daily.    [provider]  ?atorvastatin (LIPITOR) 80 MG tablet Take 80 mg by mouth daily.    [provider]  ?budesonide-formoterol (SYMBICORT) 160-4.5 MCG/ACT inhaler Inhale 2 puffs into the lungs 2 (two) times daily.    [name], MD  ?Arne [place] 5 MG TABS tablet TAKE 1 TABLET BY MOUTH  TWICE DAILY 07/29/21   Troy Sine, MD  ?ezetimibe (ZETIA) 10 MG tablet Take 10 mg by mouth  daily.    [provider]  ?finasteride (PROSCAR) 5 MG tablet Take 5 mg by mouth daily.  02/18/16   [provider]  ?folic acid (FOLVITE) 1 MG tablet TAKE 1 TABLET BY MOUTH  DAILY 10/24/20   Curt Bears, MD  ?gabapentin (NEURONTIN) 300 MG capsule Take 300 mg by mouth 2 (two) times daily.    [name], MD  ?hydrochlorothiazide (HYDRODIURIL) 25 MG tablet Take 25 mg by mouth daily.  05/25/13   [provider]  ?latanoprost (XALATAN) 0.005 % ophthalmic solution Place 1 drop into both eyes at bedtime.  08/09/18   [provider]  ?metFORMIN (GLUCOPHAGE) 1000 MG tablet Take 1,000 mg by mouth 2 (two) times daily.  05/25/13   [provider]   ?metoprolol tartrate (LOPRESSOR) 25 MG tablet Take 25 mg 2 (two) times daily by mouth.    [provider]  ?mirtazapine (REMERON) 30 MG tablet TAKE 1 TABLET BY MOUTH AT  BEDTIME 09/19/20   Curt Bears, MD  ?Multiple Vitamin (MULTIVITAMIN WITH MINERALS) TABS tablet Take 1 tablet by mouth daily. Centrum Silver    [provider]  ?nitroGLYCERIN (NITROSTAT) 0.4 MG SL tablet Place 1 tablet (0.4 mg total) under the tongue every 5 (five) minutes as needed for chest pain. 05/04/18   Troy Sine, MD  ?ofloxacin (FLOXIN) 0.3 % OTIC solution  11/04/19   [provider]  ?OVER THE COUNTER MEDICATION Take 1 tablet by mouth daily as needed. Colace    [provider]  ?oxyCODONE (OXY IR/ROXICODONE) 5 MG immediate release tablet Take 1-2 tablets (5-10 mg total) by mouth every 6 (six) hours as needed for severe pain. 08/23/21   Kyung Rudd, MD  ?pantoprazole (PROTONIX) 40 MG tablet Take 40 mg daily by mouth.    [provider]  ?Pharmacist Choice Lancets [place]  07/30/20   [provider]  ?Polyethyl Glycol-Propyl Glycol (SYSTANE) 0.4-0.3 % SOLN Place 1-2 drops into both eyes 3 (three) times daily as needed (dry/irritated eyes.).    [provider]  ?prochlorperazine (COMPAZINE) 10 MG tablet Take 10 mg by mouth daily as needed for nausea or vomiting.    [provider]  ?ramipril (ALTACE) 10 MG capsule Take 10 mg by mouth 2 (two) times daily.  05/25/13   [provider]  ?Respiratory Therapy Supplies (CARETOUCH 2 CPAP HOSE HANGER) MISC See admin instructions. 07/29/18   [provider]  ? ? ?Scheduled Meds: ? insulin aspart  0-6 Units Subcutaneous Q4H  ? latanoprost  1 drop Both Eyes QHS  ? [START ON 08/28/2021] pantoprazole  40 mg Intravenous Q12H  ? ?Continuous Infusions: ? pantoprazole    ? ?PRN Meds:.morphine injection ? ?Allergies as of 08/24/2021  ? (Not on File)  ? ? ?Family History  ?Problem Relation Age of Onset  ? Heart attack Mother    ? Heart disease Mother   ? Diabetes Father   ? Stroke Father   ? Hypertension Father   ? Hyperlipidemia Sister   ? Hypertension Sister   ? Breast cancer Sister   ? ? ?Social History  ? ?Socioeconomic History  ? Marital status: Married  ?  Spouse name: Not on file  ? Number of children: Not on file  ? Years of education: Not on file  ? Highest education level: Not on file  ?Occupational History  ? Not on file  ?Tobacco Use  ? Smoking status: Former  ?  Packs/day: 0.10  ?  Years: 50.00  ?  Pack years: 5.00  ?  Types: Cigarettes  ? Smokeless tobacco: Never  ? Tobacco comments:  ?  quit smoking cigarettes July 2020  ?Vaping Use  ? Vaping Use: Never used  ?Substance and Sexual Activity  ? Alcohol use: Yes  ?  Alcohol/week: 0.0 standard drinks  ?  Comment: moderately  ? Drug use: No  ? Sexual activity: Not on file  ?Other Topics Concern  ? Not on file  ?Social History Narrative  ? Not on file  ? ?Social Determinants of Health  ? ?Financial Resource Strain: Not on file  ?Food Insecurity: Not on file  ?Transportation Needs: Not on file  ?Physical Activity: Not on file  ?Stress: Not on file  ?Social Connections: Not on file  ?Intimate Partner Violence: Not on file  ? ? ?Review of Systems: All negative except as stated above in HPI. ? ?Physical Exam: ?Vital signs: ?Vitals:  ? 08/25/21 0600 08/25/21 0809  ?BP: 115/76 127/81  ?Pulse:  (!) 107  ?Resp: 16 16  ?Temp:  97.7 ?F (36.5 ?C)  ?SpO2:  100%  ? ?  ?General:   Lethargic, chronically ill-appearing, cachetic, no acute distress  ?Head: normocephalic, atraumatic ?Eyes: anicteric sclera ?ENT: oropharynx clear ?Neck: supple, nontender ?Lungs:  Clear throughout to auscultation.   No wheezes, crackles, or rhonchi. No acute distress. ?Heart:  Regular rate and rhythm; no murmurs, clicks, rubs,  or gallops. ?Abdomen: soft, nontender, nondistended, +BS  ?Rectal:  Deferred ?Ext: no edema ? ?GI:  ?Lab Results: ?Recent Labs  ?  08/25/21 ?0322  ?WBC 11.4*  ?HGB 4.1*  ?HCT 14.0*  ?PLT 580*   ? ?BMET ?Recent Labs  ?  08/25/21 ?0230  ?NA 137  ?K 4.6  ?CL 102  ?CO2 22  ?GLUCOSE 297*  ?BUN 75*  ?CREATININE 1.20  ?CALCIUM 9.2  ? ?LFT ?Recent Labs  ?  08/25/21 ?0230  ?PROT 5.9*  ?ALBUMIN 1.9*  ?AST 16

## 2021-08-25 NOTE — ED Notes (Signed)
This RN and MD Cardma at bedside educating and going over consent form for blood transfusion. Patient and spouse verbalized understanding. Consent form signed.  ?

## 2021-08-25 NOTE — Progress Notes (Signed)
?                                  PROGRESS NOTE                                             ?                                                                                                                     ?                                         ? ? Patient Demographics:  ? ? Trevor Bailey, is a 76 y.o. male, DOB - 12-Jul-1945, ZHG:[id number] ? ?Outpatient Primary MD for the patient is [name], MD    LOS - 0  Admit date - 08/24/2021   ? ?Chief Complaint  ?Patient presents with  ? Ingestion  ?  Pt was diagnosed with lung cancer 5 weeks and has chronic pain, reports tonight he was in "so much pain that I drunk hand soap and mouthwash so I would stop hurting" denies SI/HI.   ? Diarrhea  ?  After drinking the hand soap pt reports "diarrhea"   ?    ? ?Brief Narrative (HPI from H&P)  - 76 y.o. 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 ? ? Subjective:  ? ? Trevor Bailey today has, No headache, No chest pain, No abdominal pain - No Nausea, No new weakness tingling or numbness, no SOB. ? ? Assessment  & Plan :  ? ?Acute GI bleed likely upper GI given that patient was having dark tarry stools in the ER -   Dr. Michail Sermon on-call gastroenterologist for [facility] on board he is getting 2 units of packed RBC transfusion on 08/25/2021 along with IV PPI bolus and transfusion, EGD 08/30/2021, continue to monitor H&H. ?Acute blood loss anemia hemoglobin around 4 last hemoglobin in our system was around 8.  Follow CBC  after transfusion. ?Inadvertently drinking possibly hand soap versus mouthwash.  Patient states he had a lot of pain and he inadvertently drank it.  Denies any suicidal ideation.  I requested the patient's wife to bring the bottle.  They are saying they  are looking for it.  Supportive care. ?History of CAD status post stenting denies any chest pain. ?History of A-fib presently patient is tachycardic.  Appears sinus.  Will get EKG closely monitor. Eliquis held.  Family not sure when the last dose was. ?Diabetes mellitus type 2 we will keep patient on sliding scale coverage. ? ?CBG (last 3)  ?Recent Labs  ?  08/25/21 ?0826  ?GLUCAP 254*  ? ? ?Lab Results  ?Component Value Date  ? HGBA1C 6.6 (H) 01/06/2019  ? ? ?Stage IV lung cancer receiving chemoradiation.  Being followed by Dr. Julien Nordmann oncologist.  His team will be informed of patient's hospitalization. ? ?  ? ?   ? ?Condition - Extremely Guarded ? ?Family Communication  :  wife bedside on 08/25/2021 ? ?Code Status : Full ? ?Consults  : [facility] ? ?PUD Prophylaxis : PPI IV ? ? Procedures  :    ? ?EGD -  ? ?CT head.  Nonacute. ? ?   ? ?Disposition Plan  :   ? ?Status is: Inpatient ? ? ?DVT Prophylaxis  :   ? ?SCDs Start: 08/25/21 0556 ?  ? ?Lab Results  ?Component Value Date  ? PLT 580 (H) 08/25/2021  ? ? ?Diet :  ?Diet Order   ? ?       ?  Diet NPO time specified  Diet effective now       ?  ? ?  ?  ? ?  ?  ? ?Inpatient Medications ? ?Scheduled Meds: ? insulin aspart  0-6 Units Subcutaneous Q4H  ? latanoprost  1 drop Both Eyes QHS  ? [START ON 08/28/2021] pantoprazole  40 mg Intravenous Q12H  ? ?Continuous Infusions: ? pantoprazole 8 mg/hr (08/25/21 0829)  ? ?PRN Meds:.morphine injection ? ?Antibiotics  :   ? ?Anti-infectives (From admission, onward)  ? ? None  ? ?  ? ? ? Time Spent in minutes  30 ? ? ?Lala Lund M.D on 08/25/2021 at 9:18 AM ? ?To page go to [email or web address]  ? ?[facility] -  Office  [phone] ? ?See all Orders from today for further  details ? ? ? Objective:  ? ?Vitals:  ? 08/25/21 0530 08/25/21 0545 08/25/21 0600 08/25/21 0809  ?BP:  128/76 115/76 127/81  ?Pulse:  (!) 118  (!) 107  ?Resp:  15 16 16   ?Temp:  98.3 ?F (36.8 ?C)  97.7 ?F (36.5 ?C)  ?TempSrc: Oral Oral  Oral  ?SpO2:  99%  100%  ?Weight:      ?Height:      ? ? ?Wt Readings from Last 3 Encounters:  ?08/24/21 72.6 kg  ?08/06/21 74.4 kg  ?08/05/21 74.6 kg  ? ? ? ?Intake/Output Summary (Last 24 hours) at 08/25/2021 0918 ?Last data filed at 08/25/2021 6213 ?Gross per 24 hour  ?Intake 500 ml  ?Output --  ?Net 500 ml  ? ? ? ?Physical Exam ? ?Awake Alert, No new F.N deficits, Normal affect ?Kiel.AT,PERRAL ?Supple Neck, No JVD,   ?Symmetrical Chest wall movement, Good air movement bilaterally, CTAB ?RRR,No Gallops,Rubs or new Murmurs,  ?+ve B.Sounds, Abd Soft, No tenderness,   ?No Cyanosis, Clubbing or edema  ?   ? ? Data Review:  ? ? ?CBC ?Recent Labs  ?Lab 08/25/21 ?0322  ?WBC 11.4*  ?HGB 4.1*  ?HCT 14.0*  ?PLT 580*  ?MCV 83.3  ?MCH 24.4*  ?MCHC 29.3*  ?RDW 18.6*  ?LYMPHSABS 0.5*  ?MONOABS 0.9  ?EOSABS 0.0  ?BASOSABS 0.0  ? ? ?  Electrolytes ?Recent Labs  ?Lab 08/25/21 ?0230  ?NA 137  ?K 4.6  ?CL 102  ?CO2 22  ?GLUCOSE 297*  ?BUN 75*  ?CREATININE 1.20  ?CALCIUM 9.2  ?AST 16  ?ALT 14  ?ALKPHOS 75  ?BILITOT 0.3  ?ALBUMIN 1.9*  ? ? ?------------------------------------------------------------------------------------------------------------------ ?No results for input(s): CHOL, HDL, LDLCALC, TRIG, CHOLHDL, LDLDIRECT in the last 72 hours. ? ?Lab Results  ?Component Value Date  ? HGBA1C 6.6 (H) 01/06/2019  ? ? ?No results for input(s): TSH, T4TOTAL, T3FREE, THYROIDAB in the last 72 hours. ? ?Invalid input(s): FREET3 ?------------------------------------------------------------------------------------------------------------------ ?ID Labs ?Recent Labs  ?Lab 08/25/21 ?0230 08/25/21 ?0322  ?WBC  --  11.4*  ?PLT  --  580*  ?CREATININE 1.20  --   ? ?Cardiac Enzymes ?No results for input(s): CKMB, TROPONINI,  MYOGLOBIN in the last 168 hours. ? ?Invalid input(s): CK ? ? ?Micro Results ?No results found for this or any previous visit (from the past 240 hour(s)). ? ?Radiology Reports ?CT Head Wo Contrast ? ?Result Date: 08/25/2021 ?CLINICAL DATA:  Altered mental status EXAM: CT HEAD WITHOUT CONTRAST TECHNIQUE: Contiguous axial images were obtained from the base of the skull through the vertex without intravenous contrast. RADIATION DOSE REDUCTION: This exam was performed according to the departmental dose-optimization program which includes automated exposure control, adjustment of the mA and/or kV according to patient size and/or use of iterative reconstruction technique. COMPARISON:  08/10/2009 FINDINGS: Brain: No evidence of acute infarction, hemorrhage, hydrocephalus, extra-axial collection or mass lesion/mass effect. Chronic white matter ischemic changes are noted. Vascular: No hyperdense vessel or unexpected calcification. Skull: Normal. Negative for fracture or focal lesion. Sinuses/Orbits: No acute finding. Other: None. IMPRESSION: Chronic white matter ischemic changes without acute intracranial abnormality. Electronically Signed   By: Inez Catalina M.D.   On: 08/25/2021 02:03    ? ? ?

## 2021-08-25 NOTE — ED Notes (Signed)
POC-Occult Test-resulted Positive. MD aware. ?

## 2021-08-25 NOTE — H&P (View-Only) (Signed)
Referring Provider: Dr. Hal Hope ?Primary Care Physician:  [name], MD ?Primary Gastroenterologist:  Dr. Oletta Lamas ? ?Reason for Consultation:  Melena ? ?HPI: Trevor Bailey is a 76 y.o. male with acute onset of melena X 1 last night without abdominal pain, dizziness, N/V, or hematochezia. Hgb 4.1 (8.2 on 08/08/21). Tachycardic with normotensive in ER. Patient has been on chemoradiation for Stage IV non-small cell lung cancer reportedly for the last 2 weeks per his wife. She gives most of the history and says he has not been on his Eliquis for a week. Was on occasional NSAIDs but denies any for the past 2 weeks. Remote history of occasional alcohol. Last colonoscopy 07/2016 where 2 small hyperplastic polyps were removed. ? ?Past Medical History:  ?Diagnosis Date  ? Adenopathy   ? left supraclavicular lymph node  ? Allergies   ? COPD (chronic obstructive pulmonary disease) ([place])   ? Coronary disease   ? Diabetes ([place])   ? Enlarged prostate   ? GERD (gastroesophageal reflux disease)   ? Glaucoma   ? Hyperlipidemia   ? Hypertension   ? Myocardial infarction [facility])   ? nscl ca dx'd 12/2018  ? Sleep apnea   ? wears CPAP  ? Wears partial dentures   ? upper and lower  ? ? ?Past Surgical History:  ?Procedure Laterality Date  ? ABDOMINAL AORTAGRAM Left 09/16/2013  ? Procedure: ABDOMINAL AORTAGRAM;  Surgeon: Elam Dutch, MD;  Location: [facility] CATH LAB;  Service: Cardiovascular;  Laterality: Left;  ? CARDIAC CATHETERIZATION  01/2011  ? When there was segmental stenosis of the distal RCA, patent PCA stent, patent circumflex stent, and a patent small diagonal with 90% ISR.   ? COLONOSCOPY W/ BIOPSIES AND POLYPECTOMY    ? CORONARY ANGIOPLASTY  may 2002  ? Non-DES stenting of his circumflex, non-DES stenting of the RCA  ? HYDROCELE EXCISION / REPAIR  2009  ? by Dr Janice Norrie  ? INGUINAL HERNIA REPAIR Bilateral   ? Dr Bubba Camp  ? LYMPH NODE BIOPSY Left 01/10/2019  ? Procedure: left supraclavicular LYMPH NODE BIOPSY;  Surgeon: [name], MD;  Location: [facility];  Service: Thoracic;  Laterality: Left;  ? MULTIPLE TOOTH EXTRACTIONS    ? NM MYOCAR PERF WALL MOTION  01/08/2011  ? moderate in size and intensity area of reversible ischemia in the basal to mid inferior and septal territories. Abnormal study  ? stents  2008  ? proximal RCA, DES for progession of disease.  ? US ECHOCARDIOGRAPHY  01/12/2012  ? mild concentric LVH, borderline LA enlargement, mild to mod TR  ? ? ?Prior to Admission medications   ?Medication Sig Start Date End Date Taking? Authorizing Provider  ?ACCU-CHEK GUIDE test strip  11/09/19   [provider]  ?albuterol (VENTOLIN HFA) 108 (90 Base) MCG/ACT inhaler  05/10/20   [provider]  ?amLODipine (NORVASC) 5 MG tablet TAKE 1 TABLET BY MOUTH  DAILY 01/29/21   Troy Sine, MD  ?aspirin EC 81 MG tablet Take 81 mg by mouth daily.    [provider]  ?atorvastatin (LIPITOR) 80 MG tablet Take 80 mg by mouth daily.    [provider]  ?budesonide-formoterol (SYMBICORT) 160-4.5 MCG/ACT inhaler Inhale 2 puffs into the lungs 2 (two) times daily.    [name], MD  ?Arne [place] 5 MG TABS tablet TAKE 1 TABLET BY MOUTH  TWICE DAILY 07/29/21   Troy Sine, MD  ?ezetimibe (ZETIA) 10 MG tablet Take 10 mg by mouth  daily.    [provider]  ?finasteride (PROSCAR) 5 MG tablet Take 5 mg by mouth daily.  02/18/16   [provider]  ?folic acid (FOLVITE) 1 MG tablet TAKE 1 TABLET BY MOUTH  DAILY 10/24/20   Curt Bears, MD  ?gabapentin (NEURONTIN) 300 MG capsule Take 300 mg by mouth 2 (two) times daily.    [name], MD  ?hydrochlorothiazide (HYDRODIURIL) 25 MG tablet Take 25 mg by mouth daily.  05/25/13   [provider]  ?latanoprost (XALATAN) 0.005 % ophthalmic solution Place 1 drop into both eyes at bedtime.  08/09/18   [provider]  ?metFORMIN (GLUCOPHAGE) 1000 MG tablet Take 1,000 mg by mouth 2 (two) times daily.  05/25/13   [provider]   ?metoprolol tartrate (LOPRESSOR) 25 MG tablet Take 25 mg 2 (two) times daily by mouth.    [provider]  ?mirtazapine (REMERON) 30 MG tablet TAKE 1 TABLET BY MOUTH AT  BEDTIME 09/19/20   Curt Bears, MD  ?Multiple Vitamin (MULTIVITAMIN WITH MINERALS) TABS tablet Take 1 tablet by mouth daily. Centrum Silver    [provider]  ?nitroGLYCERIN (NITROSTAT) 0.4 MG SL tablet Place 1 tablet (0.4 mg total) under the tongue every 5 (five) minutes as needed for chest pain. 05/04/18   Troy Sine, MD  ?ofloxacin (FLOXIN) 0.3 % OTIC solution  11/04/19   [provider]  ?OVER THE COUNTER MEDICATION Take 1 tablet by mouth daily as needed. Colace    [provider]  ?oxyCODONE (OXY IR/ROXICODONE) 5 MG immediate release tablet Take 1-2 tablets (5-10 mg total) by mouth every 6 (six) hours as needed for severe pain. 08/23/21   Kyung Rudd, MD  ?pantoprazole (PROTONIX) 40 MG tablet Take 40 mg daily by mouth.    [provider]  ?Pharmacist Choice Lancets [place]  07/30/20   [provider]  ?Polyethyl Glycol-Propyl Glycol (SYSTANE) 0.4-0.3 % SOLN Place 1-2 drops into both eyes 3 (three) times daily as needed (dry/irritated eyes.).    [provider]  ?prochlorperazine (COMPAZINE) 10 MG tablet Take 10 mg by mouth daily as needed for nausea or vomiting.    [provider]  ?ramipril (ALTACE) 10 MG capsule Take 10 mg by mouth 2 (two) times daily.  05/25/13   [provider]  ?Respiratory Therapy Supplies (CARETOUCH 2 CPAP HOSE HANGER) MISC See admin instructions. 07/29/18   [provider]  ? ? ?Scheduled Meds: ? insulin aspart  0-6 Units Subcutaneous Q4H  ? latanoprost  1 drop Both Eyes QHS  ? [START ON 08/28/2021] pantoprazole  40 mg Intravenous Q12H  ? ?Continuous Infusions: ? pantoprazole    ? ?PRN Meds:.morphine injection ? ?Allergies as of 08/24/2021  ? (Not on File)  ? ? ?Family History  ?Problem Relation Age of Onset  ? Heart attack Mother    ? Heart disease Mother   ? Diabetes Father   ? Stroke Father   ? Hypertension Father   ? Hyperlipidemia Sister   ? Hypertension Sister   ? Breast cancer Sister   ? ? ?Social History  ? ?Socioeconomic History  ? Marital status: Married  ?  Spouse name: Not on file  ? Number of children: Not on file  ? Years of education: Not on file  ? Highest education level: Not on file  ?Occupational History  ? Not on file  ?Tobacco Use  ? Smoking status: Former  ?  Packs/day: 0.10  ?  Years: 50.00  ?  Pack years: 5.00  ?  Types: Cigarettes  ? Smokeless tobacco: Never  ? Tobacco comments:  ?  quit smoking cigarettes July 2020  ?Vaping Use  ? Vaping Use: Never used  ?Substance and Sexual Activity  ? Alcohol use: Yes  ?  Alcohol/week: 0.0 standard drinks  ?  Comment: moderately  ? Drug use: No  ? Sexual activity: Not on file  ?Other Topics Concern  ? Not on file  ?Social History Narrative  ? Not on file  ? ?Social Determinants of Health  ? ?Financial Resource Strain: Not on file  ?Food Insecurity: Not on file  ?Transportation Needs: Not on file  ?Physical Activity: Not on file  ?Stress: Not on file  ?Social Connections: Not on file  ?Intimate Partner Violence: Not on file  ? ? ?Review of Systems: All negative except as stated above in HPI. ? ?Physical Exam: ?Vital signs: ?Vitals:  ? 08/25/21 0600 08/25/21 0809  ?BP: 115/76 127/81  ?Pulse:  (!) 107  ?Resp: 16 16  ?Temp:  97.7 ?F (36.5 ?C)  ?SpO2:  100%  ? ?  ?General:   Lethargic, chronically ill-appearing, cachetic, no acute distress  ?Head: normocephalic, atraumatic ?Eyes: anicteric sclera ?ENT: oropharynx clear ?Neck: supple, nontender ?Lungs:  Clear throughout to auscultation.   No wheezes, crackles, or rhonchi. No acute distress. ?Heart:  Regular rate and rhythm; no murmurs, clicks, rubs,  or gallops. ?Abdomen: soft, nontender, nondistended, +BS  ?Rectal:  Deferred ?Ext: no edema ? ?GI:  ?Lab Results: ?Recent Labs  ?  08/25/21 ?0322  ?WBC 11.4*  ?HGB 4.1*  ?HCT 14.0*  ?PLT 580*   ? ?BMET ?Recent Labs  ?  08/25/21 ?0230  ?NA 137  ?K 4.6  ?CL 102  ?CO2 22  ?GLUCOSE 297*  ?BUN 75*  ?CREATININE 1.20  ?CALCIUM 9.2  ? ?LFT ?Recent Labs  ?  08/25/21 ?0230  ?PROT 5.9*  ?ALBUMIN 1.9*  ?AST 16

## 2021-08-26 ENCOUNTER — Inpatient Hospital Stay (HOSPITAL_COMMUNITY): Payer: Medicare Other | Admitting: Certified Registered Nurse Anesthetist

## 2021-08-26 ENCOUNTER — Encounter (HOSPITAL_COMMUNITY)
Admission: EM | Disposition: A | Discharge status: Home / Self Care | Payer: Self-pay | Source: Home / Self Care | Attending: Internal Medicine

## 2021-08-26 ENCOUNTER — Ambulatory Visit: Payer: Medicare Other

## 2021-08-26 DIAGNOSIS — K922 Gastrointestinal hemorrhage, unspecified: Secondary | ICD-10-CM | POA: Diagnosis not present

## 2021-08-26 DIAGNOSIS — I2510 Atherosclerotic heart disease of native coronary artery without angina pectoris: Secondary | ICD-10-CM

## 2021-08-26 DIAGNOSIS — D62 Acute posthemorrhagic anemia: Secondary | ICD-10-CM

## 2021-08-26 DIAGNOSIS — I10 Essential (primary) hypertension: Secondary | ICD-10-CM

## 2021-08-26 DIAGNOSIS — K264 Chronic or unspecified duodenal ulcer with hemorrhage: Secondary | ICD-10-CM

## 2021-08-26 HISTORY — PX: ESOPHAGOGASTRODUODENOSCOPY (EGD) WITH PROPOFOL: SHX5813

## 2021-08-26 LAB — CBC WITH DIFFERENTIAL/PLATELET
Abs Immature Granulocytes: 0.09 10*3/uL — ABNORMAL HIGH (ref 0.00–0.07)
Basophils Absolute: 0 10*3/uL (ref 0.0–0.1)
Basophils Relative: 0 %
Eosinophils Absolute: 0 10*3/uL (ref 0.0–0.5)
Eosinophils Relative: 0 %
HCT: 22 % — ABNORMAL LOW (ref 39.0–52.0)
Hemoglobin: 7 g/dL — ABNORMAL LOW (ref 13.0–17.0)
Immature Granulocytes: 1 %
Lymphocytes Relative: 5 %
Lymphs Abs: 0.5 10*3/uL — ABNORMAL LOW (ref 0.7–4.0)
MCH: 27 pg (ref 26.0–34.0)
MCHC: 31.8 g/dL (ref 30.0–36.0)
MCV: 84.9 fL (ref 80.0–100.0)
Monocytes Absolute: 1 10*3/uL (ref 0.1–1.0)
Monocytes Relative: 10 %
Neutro Abs: 8.4 10*3/uL — ABNORMAL HIGH (ref 1.7–7.7)
Neutrophils Relative %: 84 %
Platelets: 518 10*3/uL — ABNORMAL HIGH (ref 150–400)
RBC: 2.59 MIL/uL — ABNORMAL LOW (ref 4.22–5.81)
RDW: 17.4 % — ABNORMAL HIGH (ref 11.5–15.5)
WBC: 10 10*3/uL (ref 4.0–10.5)
nRBC: 1.3 % — ABNORMAL HIGH (ref 0.0–0.2)

## 2021-08-26 LAB — COMPREHENSIVE METABOLIC PANEL
ALT: 15 U/L (ref 0–44)
AST: 15 U/L (ref 15–41)
Albumin: 1.7 g/dL — ABNORMAL LOW (ref 3.5–5.0)
Alkaline Phosphatase: 65 U/L (ref 38–126)
Anion gap: 10 (ref 5–15)
BUN: 55 mg/dL — ABNORMAL HIGH (ref 8–23)
CO2: 22 mmol/L (ref 22–32)
Calcium: 8.8 mg/dL — ABNORMAL LOW (ref 8.9–10.3)
Chloride: 111 mmol/L (ref 98–111)
Creatinine, Ser: 1.15 mg/dL (ref 0.61–1.24)
GFR, Estimated: >60 mL/min (ref >60)
Glucose, Bld: 161 mg/dL — ABNORMAL HIGH (ref 70–99)
Potassium: 3.3 mmol/L — ABNORMAL LOW (ref 3.5–5.1)
Sodium: 143 mmol/L (ref 135–145)
Total Bilirubin: 0.5 mg/dL (ref 0.3–1.2)
Total Protein: 5.8 g/dL — ABNORMAL LOW (ref 6.5–8.1)

## 2021-08-26 LAB — GLUCOSE, CAPILLARY
Glucose-Capillary: 158 mg/dL — ABNORMAL HIGH (ref 70–99)
Glucose-Capillary: 152 mg/dL — ABNORMAL HIGH (ref 70–99)
Glucose-Capillary: 169 mg/dL — ABNORMAL HIGH (ref 70–99)
Glucose-Capillary: 173 mg/dL — ABNORMAL HIGH (ref 70–99)
Glucose-Capillary: 237 mg/dL — ABNORMAL HIGH (ref 70–99)
Glucose-Capillary: 166 mg/dL — ABNORMAL HIGH (ref 70–99)
Glucose-Capillary: 127 mg/dL — ABNORMAL HIGH (ref 70–99)

## 2021-08-26 LAB — CBC
HCT: 22.3 % — ABNORMAL LOW (ref 39.0–52.0)
Hemoglobin: 7.2 g/dL — ABNORMAL LOW (ref 13.0–17.0)
MCH: 27.8 pg (ref 26.0–34.0)
MCHC: 32.3 g/dL (ref 30.0–36.0)
MCV: 86.1 fL (ref 80.0–100.0)
Platelets: 500 10*3/uL — ABNORMAL HIGH (ref 150–400)
RBC: 2.59 MIL/uL — ABNORMAL LOW (ref 4.22–5.81)
RDW: 17.8 % — ABNORMAL HIGH (ref 11.5–15.5)
WBC: 9.6 10*3/uL (ref 4.0–10.5)
nRBC: 0.8 % — ABNORMAL HIGH (ref 0.0–0.2)

## 2021-08-26 LAB — OCCULT BLOOD, POC DEVICE: Fecal Occult Bld: POSITIVE — AB

## 2021-08-26 LAB — MAGNESIUM: Magnesium: 2.3 mg/dL (ref 1.7–2.4)

## 2021-08-26 LAB — PREPARE RBC (CROSSMATCH)

## 2021-08-26 SURGERY — ESOPHAGOGASTRODUODENOSCOPY (EGD) WITH PROPOFOL
Anesthesia: Monitor Anesthesia Care

## 2021-08-26 MED ORDER — SODIUM CHLORIDE 0.9% IV SOLUTION: Freq: Once | INTRAVENOUS | Status: AC

## 2021-08-26 MED ORDER — POTASSIUM CHLORIDE 10 MEQ/100ML IV SOLN
10.0000 meq | INTRAVENOUS | Status: AC
  Administered 2021-08-26 (×2): 10 meq via INTRAVENOUS

## 2021-08-26 MED ORDER — POTASSIUM CHLORIDE 10 MEQ/100ML IV SOLN
10.0000 meq | INTRAVENOUS | Status: AC
  Administered 2021-08-26 (×2): 10 meq via INTRAVENOUS
  Filled 2021-08-26 (×2): qty 100

## 2021-08-26 MED ORDER — ORAL CARE MOUTH RINSE
15.0000 mL | Freq: Two times a day (BID) | OROMUCOSAL | Status: DC
  Administered 2021-08-26 – 2021-08-28 (×5): 15 mL via OROMUCOSAL

## 2021-08-26 MED ORDER — LACTATED RINGERS IV SOLN: INTRAVENOUS | Status: DC | PRN

## 2021-08-26 MED ORDER — PHENYLEPHRINE 40 MCG/ML (10ML) SYRINGE FOR IV PUSH (FOR BLOOD PRESSURE SUPPORT)
PREFILLED_SYRINGE | INTRAVENOUS | Status: DC | PRN
  Administered 2021-08-26: 80 ug via INTRAVENOUS

## 2021-08-26 MED ORDER — PROPOFOL 10 MG/ML IV BOLUS
INTRAVENOUS | Status: DC | PRN
Start: 2021-08-26 — End: 2021-08-26
  Administered 2021-08-26: 5 mg via INTRAVENOUS
  Administered 2021-08-26 (×2): 10 mg via INTRAVENOUS
  Administered 2021-08-26: 5 mg via INTRAVENOUS

## 2021-08-26 MED ORDER — SODIUM CHLORIDE 0.9 % IV SOLN: INTRAVENOUS | Status: DC

## 2021-08-26 MED ORDER — PROPOFOL 500 MG/50ML IV EMUL
INTRAVENOUS | Status: DC | PRN
  Administered 2021-08-26: 100 ug/kg/min via INTRAVENOUS

## 2021-08-26 MED ORDER — CHLORHEXIDINE GLUCONATE 0.12 % MT SOLN
15.0000 mL | Freq: Two times a day (BID) | OROMUCOSAL | Status: DC
  Administered 2021-08-26 – 2021-08-28 (×6): 15 mL via OROMUCOSAL
  Filled 2021-08-26 (×4): qty 15

## 2021-08-26 MED ORDER — DEXTROSE 5 % IV SOLN: INTRAVENOUS | Status: AC

## 2021-08-26 MED ORDER — LACTATED RINGERS IV SOLN
INTRAVENOUS | Status: AC | PRN
Start: 2021-08-26 — End: 2021-08-26
  Administered 2021-08-26: 1000 mL via INTRAVENOUS

## 2021-08-26 MED ORDER — LIDOCAINE 2% (20 MG/ML) 5 ML SYRINGE
INTRAMUSCULAR | Status: DC | PRN
  Administered 2021-08-26: 60 mg via INTRAVENOUS

## 2021-08-26 SURGICAL SUPPLY — 15 items

## 2021-08-26 NOTE — Plan of Care (Signed)

## 2021-08-26 NOTE — Anesthesia Procedure Notes (Signed)
Procedure Name: [place] ?Date/Time: 08/26/2021 10:38 AM ?Performed by: Janene Harvey, CRNA ?Pre-anesthesia Checklist: Patient identified, Emergency Drugs available, Suction available and Patient being monitored ?Patient Re-evaluated:Patient Re-evaluated prior to induction ?Oxygen Delivery Method: Nasal cannula ?Induction Type: IV induction ?Placement Confirmation: positive ETCO2 ?Dental Injury: Teeth and Oropharynx as per pre-operative assessment  ? ? ? ? ?

## 2021-08-26 NOTE — Progress Notes (Signed)
OT Cancellation Note ? ?Patient Details ?Name: Trevor Bailey ?MRN: [id number] ?DOB: February 25, 1946 ? ? ?Cancelled Treatment:    Reason Eval/Treat Not Completed: Medical issues which prohibited therapy;Other (comment) (Per RN pt is in a lot of pain and need for blood transfusion, will hold OT eval for today and resume tomorrow.) ? ?Mohammed Mcandrew OTR/L ?08/26/2021, 1:54 PM ?

## 2021-08-26 NOTE — Anesthesia Preprocedure Evaluation (Addendum)
Anesthesia Evaluation  ?Patient identified by MRN, date of birth, ID band ?Patient awake ? ? ? ?Reviewed: ?Allergy & Precautions, NPO status , Patient's Chart, lab work & pertinent test results, reviewed documented beta blocker date and time  ? ?History of Anesthesia Complications ?Negative for: history of anesthetic complications ? ?Airway ?Mallampati: II ? ?TM Distance: >3 FB ?Neck ROM: Full ? ? ? Dental ? ?(+) Dental Advisory Given ?  ?Pulmonary ?sleep apnea and Continuous Positive Airway Pressure Ventilation , COPD,  COPD inhaler, former smoker,  ? ?NSCLC ? ?  ?Pulmonary exam normal ? ? ? ? ? ? ? Cardiovascular ?hypertension, Pt. on medications and Pt. on home beta blockers ?+ CAD, + Past MI, + Cardiac Stents and + Peripheral Vascular Disease  ?Normal cardiovascular exam ? ? ?  ?Neuro/Psych ?negative neurological ROS ? negative psych ROS  ? GI/Hepatic ?Neg liver ROS, GERD  Medicated and Controlled,  ?Endo/Other  ?diabetes, Type 2, Oral Hypoglycemic Agents ?K 3.3 ? ? Renal/GU ?negative Renal ROS  ? ?  ?Musculoskeletal ?negative musculoskeletal ROS ?(+)  ? Abdominal ?  ?Peds ? Hematology ? ?(+) Blood dyscrasia, anemia ,  ?On eliquis ?   ?Anesthesia Other Findings ? ? Reproductive/Obstetrics ? ?  ? ? ? ? ? ? ? ? ? ? ? ? ? ?  ?  ? ? ? ? ? ? ? ?Anesthesia Physical ?Anesthesia Plan ? ?ASA: 3 ? ?Anesthesia Plan: MAC  ? ?Post-op Pain Management: Minimal or no pain anticipated  ? ?Induction:  ? ?PONV Risk Score and Plan: 1 and Propofol infusion and Treatment may vary due to age or medical condition ? ?Airway Management Planned: Nasal Cannula and Natural Airway ? ?Additional Equipment: None ? ?Intra-op Plan:  ? ?Post-operative Plan:  ? ?Informed Consent: I have reviewed the patients History and Physical, chart, labs and discussed the procedure including the risks, benefits and alternatives for the proposed anesthesia with the patient or authorized representative who has indicated his/her  understanding and acceptance.  ? ? ? ? ? ?Plan Discussed with: CRNA and Anesthesiologist ? ?Anesthesia Plan Comments:   ? ? ? ? ? ?Anesthesia Quick Evaluation ? ?

## 2021-08-26 NOTE — Anesthesia Postprocedure Evaluation (Signed)
Anesthesia Post Note ? ?Patient: Trevor Bailey ? ?Procedure(s) Performed: ESOPHAGOGASTRODUODENOSCOPY (EGD) WITH PROPOFOL ? ?  ? ?Patient location during evaluation: PACU ?Anesthesia Type: MAC ?Level of consciousness: awake and alert ?Pain management: pain level controlled ?Vital Signs Assessment: post-procedure vital signs reviewed and stable ?Respiratory status: spontaneous breathing, nonlabored ventilation and respiratory function stable ?Cardiovascular status: stable and blood pressure returned to baseline ?Anesthetic complications: no ? ? ?No notable events documented. ? ?Last Vitals:  ?Vitals:  ? 08/26/21 1115 08/26/21 1208  ?BP: 123/71 127/68  ?Pulse: 92 98  ?Resp: 17 20  ?Temp: 36.7 ?C   ?SpO2: 100% 100%  ?  ?Last Pain:  ?Vitals:  ? 08/26/21 1115  ?TempSrc:   ?PainSc: 0-No pain  ? ? ?  ?  ?  ?  ?  ?  ? ?Audry Pili ? ? ? ? ?

## 2021-08-26 NOTE — Evaluation (Signed)
Physical Therapy Evaluation ?Patient Details ?Name: Trevor Bailey ?MRN: [id number] ?DOB: 14-Feb-1946 ?Today's Date: 08/26/2021 ? ?History of Present Illness ? 76 y.o. male inadvertently drank a bottle containing hand soap or mouthwash and was brought to ED 08/25/21. + GI bleed with Hgb 4.1 with 2 units PRBC transfused. Mild confusion with head CT negative. 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  ?Pt admitted secondary to problem above with deficits below. PTA patient was living with spouse and independent with mobility. Pt currently requires min assist to ambulate only 12 ft (limited by left neck and shoulder pain). Encouraged to maximize mobility to prepare for discharge home, however pt adamant that his pain is too severe. RN made aware.  Anticipate patient will benefit from PT to address problems listed below.Will continue to follow acutely to maximize functional mobility independence and safety.   ?   ?   ? ?Recommendations for follow up therapy are one component of a multi-disciplinary discharge planning process, led by the attending physician.  Recommendations may be updated based on patient status, additional functional criteria and insurance authorization. ? ?Follow Up Recommendations Home health PT (if requires RW at discharge) ? ?  ?Assistance Recommended at Discharge Set up Supervision/Assistance  ?Patient can return home with the following ? A little help with walking and/or transfers;Help with stairs or ramp for entrance ? ?  ?Equipment Recommendations Rolling walker (2 wheels) (may progresss and not need)  ?Recommendations for Other Services ? OT consult  ?  ?Functional Status Assessment Patient has had a recent decline in their functional status and demonstrates the ability to make significant improvements in function in a reasonable and predictable amount of time.   ? ?  ?Precautions / Restrictions Precautions ?Precautions: Fall  ? ?  ? ?Mobility ? Bed Mobility ?Overal bed mobility: Modified Independent ?  ?  ?  ?  ?  ?  ?General bed mobility comments: supine to sit and sit to supine ?  ? ?Transfers ?Overall transfer level: Needs assistance ?Equipment used: None ?Transfers: Sit to/from Stand ?Sit to Stand: Min guard ?  ?  ?  ?  ?  ?General transfer comment: pt stood with bil knees flexed and reported he could not fully stand upright due to left shoulder and neck pain ?  ? ?Ambulation/Gait ?Ambulation/Gait assistance: Min assist ?Gait Distance (Feet): 12 Feet ?Assistive device: Rolling walker (2 wheels), None ?Gait Pattern/deviations: Step-to pattern, Knee flexed in stance - right, Knee flexed in stance - left, Shuffle ?  ?  ?  ?General Gait Details: pt with shuffling gait in crouched posture due to pain; attempted use of RW x 2 steps with pt stating it does not help and abandoning RW with pt returning to bed ? ?Stairs ?  ?  ?  ?  ?  ? ?Wheelchair Mobility ?  ? ?Modified Rankin (Stroke Patients Only) ?  ? ?  ? ?Balance Overall balance assessment: Needs assistance ?Sitting-balance support: No upper extremity supported ?Sitting balance-Leahy Scale: Fair ?  ?  ?Standing balance support: No upper extremity supported ?Standing balance-Leahy Scale: Fair ?Standing balance comment: crouched posture with slight lean to left ?  ?  ?  ?  ?  ?  ?  ?  ?  ?  ?  ?   ? ? ? ?Pertinent Vitals/Pain Pain Assessment ?Pain Assessment: Faces ?Faces Pain Scale: Hurts whole lot ?  Pain Location: left neck and shoulder ?Pain Descriptors / Indicators: Nagging, Constant ?Pain Intervention(s): Limited activity within patient's tolerance, Patient requesting pain meds-RN notified, Monitored during session  ? ? ?Home Living Family/patient expects to be discharged to:: Private residence ?Living Arrangements: Spouse/significant other ?Available Help at Discharge: Family ?Type of Home: House ?Home Access: Stairs  to enter ?  ?Entrance Stairs-Number of Steps: 2 ?  ?Home Layout: One level ?Home Equipment: None ?   ?  ?Prior Function Prior Level of Function : Independent/Modified Independent ?  ?  ?  ?  ?  ?  ?  ?  ?  ? ? ?Hand Dominance  ?   ? ?  ?Extremity/Trunk Assessment  ? Upper Extremity Assessment ?Upper Extremity Assessment: Defer to OT evaluation ?  ? ?Lower Extremity Assessment ?Lower Extremity Assessment: Overall WFL for tasks assessed ?  ? ?Cervical / Trunk Assessment ?Cervical / Trunk Assessment: Other exceptions ?Cervical / Trunk Exceptions: pt holds head in neutral position due to neck/shoulder pain  ?Communication  ? Communication: No difficulties  ?Cognition Arousal/Alertness: Awake/alert ?Behavior During Therapy: [facility] for tasks assessed/performed ?Overall Cognitive Status: Within Functional Limits for tasks assessed ?  ?  ?  ?  ?  ?  ?  ?  ?  ?  ?  ?  ?  ?  ?  ?  ?General Comments: pt primarily preoccupied with his pain ?  ?  ? ?  ?General Comments General comments (skin integrity, edema, etc.): Wife present and encouraging pt to do as much as he can. ? ?  ?Exercises Other Exercises ?Other Exercises: encouraged pt to do AROM with bil LEs (AP, heelslides)  ? ?Assessment/Plan  ?  ?PT Assessment Patient needs continued PT services  ?PT Problem List Decreased range of motion;Decreased activity tolerance;Decreased balance;Decreased mobility;Decreased knowledge of use of DME;Decreased safety awareness;Pain ? ?   ?  ?PT Treatment Interventions DME instruction;Gait training;Functional mobility training;Therapeutic activities;Therapeutic exercise;Balance training;Patient/family education   ? ?PT Goals (Current goals can be found in the Care Plan section)  ?Acute Rehab PT Goals ?Patient Stated Goal: be able to go home when MD releases him ?PT Goal Formulation: With patient ?Time For Goal Achievement: 09/09/21 ?Potential to Achieve Goals: Fair ? ?  ?Frequency Min 3X/week ?  ? ? ?Co-evaluation   ?  ?  ?  ?  ? ? ?  ?AM-PAC  PT "6 Clicks" Mobility  ?Outcome Measure Help needed turning from your back to your side while in a flat bed without using bedrails?: None ?Help needed moving from lying on your back to sitting on the side of a flat bed without using bedrails?: None ?Help needed moving to and from a bed to a chair (including a wheelchair)?: A Little ?Help needed standing up from a chair using your arms (e.g., wheelchair or bedside chair)?: A Little ?Help needed to walk in hospital room?: A Little ?Help needed climbing 3-5 steps with a railing? : A Little ?6 Click Score: 20 ? ?  ?End of Session Equipment Utilized During Treatment: Gait belt ?Activity Tolerance: Patient limited by pain ?Patient left: in bed;with call bell/phone within reach;with bed alarm set;with family/visitor present ?Nurse Communication: Mobility status;Patient requests pain meds;Other (comment) (IV bag to gravity and no through pump) ?PT Visit Diagnosis: Unsteadiness on feet (R26.81);Pain ?Pain - Right/Left: Left ?Pain - part of body: Shoulder (and neck) ?  ? ?Time: 3790-2409 ?PT Time Calculation (min) (ACUTE ONLY): 22 min ? ? ?Charges:   PT Evaluation ?$PT Eval  Low Complexity: 1 Low ?  ?  ?   ? ? ? ?Arby Barrette, PT ?Acute Rehabilitation Services  ?Pager [phone] ?Office [phone] ? ? ?Jeanie Cooks Asheton Scheffler ?08/26/2021, 1:30 PM ? ?

## 2021-08-26 NOTE — Interval H&P Note (Signed)
History and Physical Interval Note: ? ?08/26/2021 ?10:31 AM ? ?Trevor Bailey  has presented today for surgery, with the diagnosis of GI Bleed.  The various methods of treatment have been discussed with the patient and family. After consideration of risks, benefits and other options for treatment, the patient has consented to  Procedure(s): ?ESOPHAGOGASTRODUODENOSCOPY (EGD) WITH PROPOFOL (N/A) as a surgical intervention.  The patient's history has been reviewed, patient examined, no change in status, stable for surgery.  I have reviewed the patient's chart and labs.  Questions were answered to the patient's satisfaction.   ? ? ?[name] ? ? ?

## 2021-08-26 NOTE — Transfer of Care (Signed)
Immediate Anesthesia Transfer of Care Note ? ?Patient: Trevor Bailey ? ?Procedure(s) Performed: ESOPHAGOGASTRODUODENOSCOPY (EGD) WITH PROPOFOL ? ?Patient Location: PACU ? ?Anesthesia Type:MAC ? ?Level of Consciousness: drowsy and patient cooperative ? ?Airway & Oxygen Therapy: Patient Spontanous Breathing and Patient connected to nasal cannula oxygen ? ?Post-op Assessment: Report given to RN and Post -op Vital signs reviewed and stable ? ?Post vital signs: Reviewed and stable ? ?Last Vitals:  ?Vitals Value Taken Time  ?BP 116/66 08/26/21 1058  ?Temp 36.7 ?C 08/26/21 1058  ?Pulse 95 08/26/21 1059  ?Resp 22 08/26/21 1100  ?SpO2 100 % 08/26/21 1059  ?Vitals shown include unvalidated device data. ? ?Last Pain:  ?Vitals:  ? 08/26/21 0919  ?TempSrc: Temporal  ?PainSc: 9   ?   ? ?Patients Stated Pain Goal: 0 (08/26/21 0120) ? ?Complications: No notable events documented. ?

## 2021-08-26 NOTE — Op Note (Signed)
[facility] ?Patient Name: Trevor Bailey ?Procedure Date : 08/26/2021 ?MRN: [id number] ?Attending MD: Arta Silence , MD ?Date of Birth: 1945/05/24 ?CSN: [id number] ?Age: 76 ?Admit Type: Inpatient ?Procedure:                Upper GI endoscopy ?Indications:              Acute post hemorrhagic anemia, Melena ?Providers:                Arta Silence, MD, Carmie End, RN, Janie  ?                          [place], Technician, Reather Laurence, CRNA ?Referring MD:             Curt Bears ?Medicines:                Monitored Anesthesia Care ?Complications:            No immediate complications. ?Estimated Blood Loss:     Estimated blood loss: none. ?Procedure:                Pre-Anesthesia Assessment: ?                          - Prior to the procedure, a History and Physical  ?                          was performed, and patient medications and  ?                          allergies were reviewed. The patient's tolerance of  ?                          previous anesthesia was also reviewed. The risks  ?                          and benefits of the procedure and the sedation  ?                          options and risks were discussed with the patient.  ?                          All questions were answered, and informed consent  ?                          was obtained. Prior Anticoagulants: The patient has  ?                          taken Eliquis (apixaban), last dose was 2 days  ?                          prior to procedure. ASA Grade Assessment: III - A  ?                          patient with severe systemic disease. After  ?  reviewing the risks and benefits, the patient was  ?                          deemed in satisfactory condition to undergo the  ?                          procedure. ?                          After obtaining informed consent, the endoscope was  ?                          passed under direct vision. Throughout the  ?                          procedure,  the patient's blood pressure, pulse, and  ?                          oxygen saturations were monitored continuously. The  ?                          GIF-H190 ([id number]) Olympus endoscope was introduced  ?                          through the mouth, and advanced to the second part  ?                          of duodenum. The upper GI endoscopy was  ?                          accomplished without difficulty. The patient  ?                          tolerated the procedure well. ?Scope In: ?Scope Out: ?Findings: ?     The examined esophagus was normal. ?     The entire examined stomach was normal. ?     One large non-bleeding cratered duodenal ulcer with pigmented material  ?     was found in the duodenal bulb. Ulcer is deep lots of mucous and  ?     adherent debris. No active bleeding. No obvious visible vessel The  ?     lesion was 30 mm in largest dimension. This vessel is NOT amenable to  ?     endoscopic therapy. ?     The exam of the duodenum was otherwise normal except for extensive  ?     pylori and peribulbar duodenal mucosal edema. No active bleeding seen.  ?     No old/fresh blood seen to second portion of duodenum. ?Impression:               - Normal esophagus. ?                          - Normal stomach. ?                          - One large, deep, non-bleeding duodenal ulcer with  ?  pigmented material in duodenal bulb. ?                          - No specimens collected. ?Recommendation:           - Return patient to hospital ward for ongoing care. ?                          - Clear liquid diet today. ?                          - Continue PPI gtt for at least the next 48 hours. ?                          - Patient VERY HIGH RISK for rebleeding; would stay  ?                          off Eliquis for at least the next 2-3 months; if  ?                          this is not possible, then there is high risks of  ?                          rebleeding, and I would suggest that IR be  called  ?                          to consider empiric embolization of duodenal bulb.  ?                          This ulcer is NOT amenable to endoscopic therapy. ?                          - [facility] will follow. ?Procedure Code(s):        --- Professional --- ?                          [phone], Esophagogastroduodenoscopy, flexible,  ?                          transoral; diagnostic, including collection of  ?                          specimen(s) by brushing or washing, when performed  ?                          (separate procedure) ?Diagnosis Code(s):        --- Professional --- ?                          K26.9, Duodenal ulcer, unspecified as acute or  ?                          chronic, without hemorrhage or perforation ?  D62, Acute posthemorrhagic anemia ?                          K92.1, Melena (includes Hematochezia) ?CPT copyright 2019 American Medical Association. All rights reserved. ?The codes documented in this report are preliminary and upon coder review may  ?be revised to meet current compliance requirements. ?Arta Silence, MD ?08/26/2021 11:02:44 AM ?This report has been signed electronically. ?Number of Addenda: 0 ?

## 2021-08-26 NOTE — Progress Notes (Addendum)
?                                  PROGRESS NOTE                                             ?                                                                                                                     ?                                         ? ? Patient Demographics:  ? ? Trevor Bailey, is a 76 y.o. male, DOB - 05/14/1946, YWV:[id number] ? ?Outpatient Primary MD for the patient is [name], MD    LOS - 1  Admit date - 08/24/2021   ? ?Chief Complaint  ?Patient presents with  ? Ingestion  ?  Pt was diagnosed with lung cancer 5 weeks and has chronic pain, reports tonight he was in "so much pain that I drunk hand soap and mouthwash so I would stop hurting" denies SI/HI.   ? Diarrhea  ?  After drinking the hand soap pt reports "diarrhea"   ?    ? ?Brief Narrative (HPI from H&P)  - 76 y.o. 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 ? ? Subjective:  ? ?Patient in bed, appears comfortable, denies any headache, no fever, no chest pain or pressure, no shortness of breath , no abdominal pain. No new focal weakness. ? ? ? Assessment  & Plan :  ? ?Acute GI bleed likely upper GI given that patient was having dark tarry stools in the ER -   Dr. Michail Sermon on-call gastroenterologist for [facility] on board he is getting 2 units of packed RBC transfusion on 08/25/2021 and will get third unit on 08/26/2021, continue  with IV PPI bolus and transfusion, EGD 08/30/2021, continue to monitor H&H and hold Eliquis. ?Acute  blood loss anemia hemoglobin around 4 last hemoglobin in our system was around 8.  Follow CBC after transfusion. ?Inadvertently drinking possibly hand soap versus mouthwash.  Patient states he had a lot of pain and he inadvertently drank it.  Denies any suicidal ideation.  I requested the patient's wife to bring the bottle.  They are saying they are looking for it.  Supportive care. ?History of CAD status post stenting denies any chest pain. ?History of A-fib presently patient is tachycardic.  Appears sinus.  Will get EKG closely monitor. Eliquis held.  Family not sure when the last dose was. ?Diabetes mellitus type 2 we will keep patient on sliding scale coverage. ? ?CBG (last 3)  ?Recent Labs  ?  08/25/21 ?2349 08/26/21 ?0326 08/26/21 ?0746  ?GLUCAP 159* 158* 152*  ? ? ?Lab Results  ?Component Value Date  ? HGBA1C 6.9 (H) 08/25/2021  ? ? ?Stage IV lung cancer receiving chemoradiation.  Being followed by Dr. Julien Nordmann oncologist.  His team will be informed of patient's hospitalization. ? ?8.  Hypokalemia.  Replaced. ? ?   ? ?Condition - Extremely Guarded ? ?Family Communication  :  wife bedside on 08/25/2021 ? ?Code Status : Full ? ?Consults  : [facility] ? ?PUD Prophylaxis : PPI IV ? ? Procedures  :    ? ?EGD -  ? ?CT head.  Nonacute. ? ?   ? ?Disposition Plan  :   ? ?Status is: Inpatient ? ? ?DVT Prophylaxis  :   ? ?SCDs Start: 08/25/21 0556 ?  ? ?Lab Results  ?Component Value Date  ? PLT 518 (H) 08/26/2021  ? ? ?Diet :  ?Diet Order   ? ?       ?  Diet NPO time specified  Diet effective now       ?  ? ?  ?  ? ?  ?  ? ?Inpatient Medications ? ?Scheduled Meds: ? [MAR Hold] chlorhexidine  15 mL Mouth Rinse BID  ? [MAR Hold] insulin aspart  0-6 Units Subcutaneous Q4H  ? [MAR Hold] latanoprost  1 drop Both Eyes QHS  ? [MAR Hold] mouth rinse  15 mL Mouth Rinse q12n4p  ? [MAR Hold] pantoprazole  40 mg Intravenous Q12H  ? [MAR Hold] pantoprazole (PROTONIX) IV  40 mg Intravenous Once  ? ?Continuous Infusions: ? dextrose 35  mL/hr at 08/26/21 0654  ? lactated ringers 1,000 mL (08/26/21 0926)  ? pantoprazole 8 mg/hr (08/26/21 0600)  ? [MAR Hold] potassium chloride 10 mEq (08/26/21 0834)  ? ?PRN Meds:.lactated ringers, [MAR Hold]  morphine injection ? ?Antibiotics  :   ? ?Anti-infectives (From admission, onward)  ? ? None  ? ?  ? ? ? Time Spent in minutes  30 ? ? ?Lala Lund M.D on 08/26/2021 at 10:04 AM ? ?To page go to [email or web address]  ? ?[facility] -  Office  [phone] ? ?See all Orders from today for further details ? ? ? Objective:  ? ?Vitals:  ? 08/25/21 2330 08/26/21 0322 08/26/21 0739 08/26/21 0919  ?BP: 118/73 123/80 114/72 121/73  ?Pulse: 100 100 100 99  ?Resp: 18 18 19 16   ?Temp: 98.8 ?F (37.1 ?C) 97.7 ?F (36.5 ?C)  98 ?F (36.7 ?C)  ?TempSrc: Oral Oral  Temporal  ?SpO2: 100% 100% 100% 100%  ?Weight:    72.6 kg  ?Height:      ? ? ?Wt Readings from Last 3 Encounters:  ?08/26/21 72.6 kg  ?08/06/21 74.4 kg  ?08/05/21 74.6 kg  ? ? ? ?Intake/Output Summary (Last 24 hours) at 08/26/2021 1004 ?Last data filed at 08/26/2021 0600 ?Gross per 24 hour  ?Intake 1303.86 ml  ?Output 300 ml  ?Net 1003.86 ml  ? ? ? ?Physical Exam ? ?Awake Alert x2,  No new F.N deficits,   ?[place].AT,PERRAL ?Supple Neck, No JVD,   ?Symmetrical Chest wall movement, Good air movement bilaterally, CTAB ?RRR,No Gallops, Rubs or new Murmurs,  ?+ve B.Sounds, Abd Soft, No tenderness,   ?No Cyanosis, Clubbing or edema  ?   ? ? Data Review:  ? ? ?CBC ?Recent Labs  ?Lab 08/25/21 ?0322 08/25/21 ?1400 08/25/21 ?1823 08/26/21 ?1540  ?WBC 11.4* 11.5* 11.6* 10.0  ?HGB 4.1* 7.3* 7.1* 7.0*  ?HCT 14.0* 22.6* 22.6* 22.0*  ?PLT 580* 534* 545* 518*  ?MCV 83.3 85.9 85.6 84.9  ?MCH 24.4* 27.8 26.9 27.0  ?MCHC 29.3* 32.3 31.4 31.8  ?RDW 18.6* 17.4* 17.3* 17.4*  ?LYMPHSABS 0.5*  --   --  0.5*  ?MONOABS 0.9  --   --  1.0  ?EOSABS 0.0  --   --  0.0  ?BASOSABS 0.0  --   --  0.0  ? ? ?Electrolytes ?Recent Labs  ?Lab 08/25/21 ?0230 08/25/21 ?0602 08/26/21 ?0867  ?NA 137  --  143  ?K 4.6   --  3.3*  ?CL 102  --  111  ?CO2 22  --  22  ?GLUCOSE 297*  --  161*  ?BUN 75*  --  55*  ?CREATININE 1.20  --  1.15  ?CALCIUM 9.2  --  8.8*  ?AST 16  --  15  ?ALT 14  --  15  ?ALKPHOS 75  --  65  ?BILITOT 0.3  --  0.5  ?ALBUMIN 1.9*  --  1.7*  ?MG  --   --  2.3  ?HGBA1C  --  6.9*  --   ? ? ?------------------------------------------------------------------------------------------------------------------ ?No results for input(s): CHOL, HDL, LDLCALC, TRIG, CHOLHDL, LDLDIRECT in the last 72 hours. ? ?Lab Results  ?Component Value Date  ? HGBA1C 6.9 (H) 08/25/2021  ? ? ?No results for input(s): TSH, T4TOTAL, T3FREE, THYROIDAB in the last 72 hours. ? ?Invalid input(s): FREET3 ?------------------------------------------------------------------------------------------------------------------ ?ID Labs ?Recent Labs  ?Lab 08/25/21 ?0230 08/25/21 ?0322 08/25/21 ?1400 08/25/21 ?1823 08/26/21 ?6195  ?WBC  --  11.4* 11.5* 11.6* 10.0  ?PLT  --  580* 534* 545* 518*  ?CREATININE 1.20  --   --   --  1.15  ? ?Cardiac Enzymes ?No results for input(s): CKMB, TROPONINI, MYOGLOBIN in the last 168 hours. ? ?Invalid input(s): CK ? ? ?Micro Results ?No results found for this or any previous visit (from the past 240 hour(s)). ? ?Radiology Reports ?CT Head Wo Contrast ? ?Result Date: 08/25/2021 ?CLINICAL DATA:  Altered mental status EXAM: CT HEAD WITHOUT CONTRAST TECHNIQUE: Contiguous axial images were obtained from the base of the skull through the vertex without intravenous contrast. RADIATION DOSE REDUCTION: This exam was performed according to the departmental dose-optimization program which includes automated exposure control, adjustment of the mA and/or kV according to patient size and/or use of iterative reconstruction technique. COMPARISON:  08/10/2009 FINDINGS: Brain: No evidence of acute infarction, hemorrhage, hydrocephalus, extra-axial collection or mass lesion/mass effect. Chronic white matter ischemic changes are noted. Vascular:  No hyperdense vessel or unexpected calcification. Skull: Normal. Negative for fracture or focal lesion. Sinuses/Orbits: No acute finding. Other: None. IMPRESSION: Chronic white matter ischemic changes with

## 2021-08-27 ENCOUNTER — Ambulatory Visit: Payer: Medicare Other

## 2021-08-27 ENCOUNTER — Encounter (HOSPITAL_COMMUNITY): Payer: Self-pay | Admitting: Gastroenterology

## 2021-08-27 DIAGNOSIS — K922 Gastrointestinal hemorrhage, unspecified: Secondary | ICD-10-CM | POA: Diagnosis not present

## 2021-08-27 LAB — CBC
HCT: 22.3 % — ABNORMAL LOW (ref 39.0–52.0)
HCT: 27.8 % — ABNORMAL LOW (ref 39.0–52.0)
Hemoglobin: 7.1 g/dL — ABNORMAL LOW (ref 13.0–17.0)
Hemoglobin: 9.1 g/dL — ABNORMAL LOW (ref 13.0–17.0)
MCH: 28.1 pg (ref 26.0–34.0)
MCH: 28.8 pg (ref 26.0–34.0)
MCHC: 31.8 g/dL (ref 30.0–36.0)
MCHC: 32.7 g/dL (ref 30.0–36.0)
MCV: 88.1 fL (ref 80.0–100.0)
MCV: 88 fL (ref 80.0–100.0)
Platelets: 441 10*3/uL — ABNORMAL HIGH (ref 150–400)
Platelets: 424 10*3/uL — ABNORMAL HIGH (ref 150–400)
RBC: 2.53 MIL/uL — ABNORMAL LOW (ref 4.22–5.81)
RBC: 3.16 MIL/uL — ABNORMAL LOW (ref 4.22–5.81)
RDW: 17 % — ABNORMAL HIGH (ref 11.5–15.5)
RDW: 16.8 % — ABNORMAL HIGH (ref 11.5–15.5)
WBC: 7.2 10*3/uL (ref 4.0–10.5)
WBC: 7.7 10*3/uL (ref 4.0–10.5)
nRBC: 0.7 % — ABNORMAL HIGH (ref 0.0–0.2)
nRBC: 0.4 % — ABNORMAL HIGH (ref 0.0–0.2)

## 2021-08-27 LAB — GLUCOSE, CAPILLARY
Glucose-Capillary: 153 mg/dL — ABNORMAL HIGH (ref 70–99)
Glucose-Capillary: 207 mg/dL — ABNORMAL HIGH (ref 70–99)
Glucose-Capillary: 193 mg/dL — ABNORMAL HIGH (ref 70–99)
Glucose-Capillary: 170 mg/dL — ABNORMAL HIGH (ref 70–99)
Glucose-Capillary: 162 mg/dL — ABNORMAL HIGH (ref 70–99)
Glucose-Capillary: 122 mg/dL — ABNORMAL HIGH (ref 70–99)

## 2021-08-27 LAB — COMPREHENSIVE METABOLIC PANEL
ALT: 17 U/L (ref 0–44)
AST: 20 U/L (ref 15–41)
Albumin: 1.6 g/dL — ABNORMAL LOW (ref 3.5–5.0)
Alkaline Phosphatase: 72 U/L (ref 38–126)
Anion gap: 6 (ref 5–15)
BUN: 30 mg/dL — ABNORMAL HIGH (ref 8–23)
CO2: 22 mmol/L (ref 22–32)
Calcium: 8.8 mg/dL — ABNORMAL LOW (ref 8.9–10.3)
Chloride: 109 mmol/L (ref 98–111)
Creatinine, Ser: 1.11 mg/dL (ref 0.61–1.24)
GFR, Estimated: >60 mL/min (ref >60)
Glucose, Bld: 142 mg/dL — ABNORMAL HIGH (ref 70–99)
Potassium: 3.6 mmol/L (ref 3.5–5.1)
Sodium: 137 mmol/L (ref 135–145)
Total Bilirubin: 0.7 mg/dL (ref 0.3–1.2)
Total Protein: 5.2 g/dL — ABNORMAL LOW (ref 6.5–8.1)

## 2021-08-27 LAB — CBC WITH DIFFERENTIAL/PLATELET
Abs Immature Granulocytes: 0.05 10*3/uL (ref 0.00–0.07)
Basophils Absolute: 0 10*3/uL (ref 0.0–0.1)
Basophils Relative: 0 %
Eosinophils Absolute: 0.1 10*3/uL (ref 0.0–0.5)
Eosinophils Relative: 1 %
HCT: 23 % — ABNORMAL LOW (ref 39.0–52.0)
Hemoglobin: 7.4 g/dL — ABNORMAL LOW (ref 13.0–17.0)
Immature Granulocytes: 1 %
Lymphocytes Relative: 6 %
Lymphs Abs: 0.5 10*3/uL — ABNORMAL LOW (ref 0.7–4.0)
MCH: 27.8 pg (ref 26.0–34.0)
MCHC: 32.2 g/dL (ref 30.0–36.0)
MCV: 86.5 fL (ref 80.0–100.0)
Monocytes Absolute: 0.9 10*3/uL (ref 0.1–1.0)
Monocytes Relative: 12 %
Neutro Abs: 6.2 10*3/uL (ref 1.7–7.7)
Neutrophils Relative %: 80 %
Platelets: 417 10*3/uL — ABNORMAL HIGH (ref 150–400)
RBC: 2.66 MIL/uL — ABNORMAL LOW (ref 4.22–5.81)
RDW: 16.8 % — ABNORMAL HIGH (ref 11.5–15.5)
WBC: 7.7 10*3/uL (ref 4.0–10.5)
nRBC: 0.9 % — ABNORMAL HIGH (ref 0.0–0.2)

## 2021-08-27 LAB — MAGNESIUM: Magnesium: 2 mg/dL (ref 1.7–2.4)

## 2021-08-27 LAB — PREPARE RBC (CROSSMATCH)

## 2021-08-27 MED ORDER — PANTOPRAZOLE SODIUM 40 MG IV SOLR: 40.0000 mg | Freq: Two times a day (BID) | INTRAVENOUS | Status: DC

## 2021-08-27 MED ORDER — SODIUM CHLORIDE 0.9% IV SOLUTION: Freq: Once | INTRAVENOUS | Status: AC

## 2021-08-27 MED ORDER — TRAMADOL HCL 50 MG PO TABS
50.0000 mg | ORAL_TABLET | Freq: Two times a day (BID) | ORAL | Status: DC | PRN
  Administered 2021-08-28: 50 mg via ORAL
  Filled 2021-08-27: qty 1

## 2021-08-27 MED ORDER — PANTOPRAZOLE INFUSION (NEW) - SIMPLE MED
8.0000 mg/h | INTRAVENOUS | Status: DC
  Administered 2021-08-27 – 2021-08-28 (×3): 8 mg/h via INTRAVENOUS
  Filled 2021-08-27 (×3): qty 80

## 2021-08-27 NOTE — Progress Notes (Signed)
PT Cancellation Note ? ?Patient Details ?Name: Trevor Bailey ?MRN: [id number] ?DOB: 05-20-1945 ? ? ?Cancelled Treatment:    Reason Eval/Treat Not Completed: Other (comment) ? ?Attempted to work with patient twice this afternoon and both times he refused. He stated he doesn't feel well and feels weak. Despite attempts to educate on rationale for PT and importance of keeping his strength up, he continued to refuse.  ? ? ?Arby Barrette, PT ?Acute Rehabilitation Services  ?Pager [phone] ?Office [phone] ? ?Trevor Bailey ?08/27/2021, 4:01 PM ?

## 2021-08-27 NOTE — Plan of Care (Signed)

## 2021-08-27 NOTE — Progress Notes (Signed)
?                                  PROGRESS NOTE                                             ?                                                                                                                     ?                                         ? ? Patient Demographics:  ? ? Trevor Bailey, is a 76 y.o. male, DOB - May 29, 1945, MWU:[id number] ? ?Outpatient Primary MD for the patient is [name], MD    LOS - 2  Admit date - 08/24/2021   ? ?Chief Complaint  ?Patient presents with  ? Ingestion  ?  Pt was diagnosed with lung cancer 5 weeks and has chronic pain, reports tonight he was in "so much pain that I drunk hand soap and mouthwash so I would stop hurting" denies SI/HI.   ? Diarrhea  ?  After drinking the hand soap pt reports "diarrhea"   ?    ? ?Brief Narrative (HPI from H&P)  - 76 y.o. 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 ? ? Subjective:  ? ?Patient in bed, appears comfortable, denies any headache, no fever, no chest pain or pressure, no shortness of breath , no abdominal pain. No new focal weakness. ? ? Assessment  & Plan :  ? ?Acute GI bleed likely upper GI given that patient was having dark tarry stools in the ER -   Dr. Michail Sermon on-call gastroenterologist for [facility] on board he is getting 3 units of packed RBC last on 08/26/2021, will give another unit on 08/27/2021, do not think he is actively bleeding right now, EGD showed large duodenal ulcer, will continue IV PPI drip for now.   Continue to monitor CBC.  If bleeding reoccurs will consult IR for embolization.  Continue to hold Eliquis for at least 3 more months.  Discontinue aspirin f permanently. ?Acute blood loss anemia hemoglobin around 4 last hemoglobin in our system was around  8.  Plan as #1 above. ?Inadvertently drinking possibly hand soap versus mouthwash.  Patient states he had a lot of pain and he inadvertently drank it.  Denies any suicidal ideation.  I requested the patient's wife to bring the bottle.  They are saying they are looking for it.  Supportive care. ?History of CAD status post stenting denies any chest pain. ?History of A-fib presently patient is tachycardic.  Appears sinus.  Will get EKG closely monitor. Eliquis held as in #1 above.  Family not sure when the last dose was.  Mali vas 2 score is at least greater than 3 will have him follow-up with cardiology for possible Watchman device evaluation in the outpatient setting. ?Diabetes mellitus type 2.  On sliding scale. ? ?CBG (last 3)  ?Recent Labs  ?  08/26/21 ?1951 08/26/21 ?2317 08/27/21 ?0442  ?GLUCAP 166* 127* 153*  ? ? ?Lab Results  ?Component Value Date  ? HGBA1C 6.9 (H) 08/25/2021  ? ? ?Stage IV lung cancer receiving chemoradiation.  Being followed by Dr. Julien Nordmann oncologist.  His team will be informed of patient's hospitalization. ? ?8.  Hypokalemia.  Replaced. ? ?   ? ?Condition - Extremely Guarded ? ?Family Communication  :  wife bedside on 08/25/2021 ? ?Code Status : Full ? ?Consults  : [facility] ? ?PUD Prophylaxis : PPI IV ? ? Procedures  :    ? ?EGD -  ? ?Impression:               - Normal esophagus. ?                          - Normal stomach. ?                          - One large, deep, non-bleeding duodenal ulcer with  ?                          pigmented material in duodenal bulb. ?                          - No specimens collected. ?Recommendation:           - Return patient to hospital ward for ongoing care. ?                          - Clear liquid diet  today. ?                          - Continue PPI gtt for at least the next 48 hours. ?                          - Patient VERY HIGH RISK for rebleeding; would stay  ?                          off Eliquis for at least the next 2-3 months; if  ?                          this is not possible, then there is high risks of  ?  rebleeding, and I would suggest that IR be called  ?                          to consider empiric embolization of duodenal bulb.  ?                          This ulcer is NOT amenable to endoscopic therapy. ?                          - [facility] will follow. ? ?CT head.  Nonacute. ? ?   ? ?Disposition Plan  :   ? ?Status is: Inpatient ? ? ?DVT Prophylaxis  :   ? ?SCDs Start: 08/25/21 0556 ?  ? ?Lab Results  ?Component Value Date  ? PLT 441 (H) 08/27/2021  ? ? ?Diet :  ?Diet Order   ? ?       ?  Diet clear liquid Room service appropriate? Yes; Fluid consistency: Thin  Diet effective now       ?  ? ?  ?  ? ?  ?  ? ?Inpatient Medications ? ?Scheduled Meds: ? sodium chloride   Intravenous Once  ? chlorhexidine  15 mL Mouth Rinse BID  ? insulin aspart  0-6 Units Subcutaneous Q4H  ? latanoprost  1 drop Both Eyes QHS  ? mouth rinse  15 mL Mouth Rinse q12n4p  ? [START ON 08/28/2021] pantoprazole  40 mg Intravenous Q12H  ? pantoprazole (PROTONIX) IV  40 mg Intravenous Once  ? ?Continuous Infusions: ? pantoprazole    ? ?PRN Meds:.morphine injection ? ?Antibiotics  :   ? ?Anti-infectives (From admission, onward)  ? ? None  ? ?  ? ? ? Time Spent in minutes  30 ? ? ?Lala Lund M.D on 08/27/2021 at 10:16 AM ? ?To page go to [email or web address]  ? ?[facility] -  Office  [phone] ? ?See all Orders from today for further details ? ? ? Objective:  ? ?Vitals:  ? 08/26/21 1559 08/26/21 1942 08/26/21 2314 08/27/21 0419  ?BP: 121/73 125/71 117/71 109/73  ?Pulse: 97 97 99 100  ?Resp: 19 16 20 20   ?Temp: 97.7 ?F (36.5 ?C) 98.6 ?F (37 ?C) 97.8 ?F (36.6 ?C) 97.8 ?F (36.6 ?C)  ?TempSrc: Oral  Oral Oral Oral  ?SpO2: 100% 100% 98% 98%  ?Weight:      ?Height:      ? ? ?Wt Readings from Last 3 Encounters:  ?08/26/21 72.6 kg  ?08/06/21 74.4 kg  ?08/05/21 74.6 kg  ? ? ? ?Intake/Output Summary (Last 24 hours) at 08/27/2021 1016 ?Last data filed at 08/26/2021 2328 ?Gross per 24 hour  ?Intake 939.63 ml  ?Output 900 ml  ?Net 39.63 ml  ? ? ? ?Physical Exam ? ?Awake Alert, No new F.N deficits, Normal affect ?[place].AT,PERRAL ?Supple Neck, No JVD,   ?Symmetrical Chest wall movement, Good air movement bilaterally, CTAB ?RRR,No Gallops, Rubs or new Murmurs,  ?+ve B.Sounds, Abd Soft, No tenderness,   ?No Cyanosis, Clubbing or edema  ? ?   ? ? Data Review:  ? ? ?CBC ?Recent Labs  ?Lab 08/25/21 ?0322 08/25/21 ?1400 08/25/21 ?1823 08/26/21 ?4270 08/26/21 ?1454 08/27/21 ?6237 08/27/21 ?6283  ?WBC 11.4*   < > 11.6* 10.0 9.6 7.7 7.2  ?HGB 4.1*   < > 7.1* 7.0* 7.2* 7.4* 7.1*  ?HCT 14.0*   < >  22.6* 22.0* 22.3* 23.0* 22.3*  ?PLT 580*   < > 545* 518* 500* 417* 441*  ?MCV 83.3   < > 85.6 84.9 86.1 86.5 88.1  ?MCH 24.4*   < > 26.9 27.0 27.8 27.8 28.1  ?MCHC 29.3*   < > 31.4 31.8 32.3 32.2 31.8  ?RDW 18.6*   < > 17.3* 17.4* 17.8* 16.8* 17.0*  ?LYMPHSABS 0.5*  --   --  0.5*  --  0.5*  --   ?MONOABS 0.9  --   --  1.0  --  0.9  --   ?EOSABS 0.0  --   --  0.0  --  0.1  --   ?BASOSABS 0.0  --   --  0.0  --  0.0  --   ? < > = values in this interval not displayed.  ? ? ?Electrolytes ?Recent Labs  ?Lab 08/25/21 ?0230 08/25/21 ?0602 08/26/21 ?4970 08/27/21 ?2637  ?NA 137  --  143 137  ?K 4.6  --  3.3* 3.6  ?CL 102  --  111 109  ?CO2 22  --  22 22  ?GLUCOSE 297*  --  161* 142*  ?BUN 75*  --  55* 30*  ?CREATININE 1.20  --  1.15 1.11  ?CALCIUM 9.2  --  8.8* 8.8*  ?AST 16  --  15 20  ?ALT 14  --  15 17  ?ALKPHOS 75  --  65 72  ?BILITOT 0.3  --  0.5 0.7  ?ALBUMIN 1.9*  --  1.7* 1.6*  ?MG  --   --  2.3 2.0  ?HGBA1C  --  6.9*  --   --    ? ? ?------------------------------------------------------------------------------------------------------------------ ?No results for input(s): CHOL, HDL, LDLCALC, TRIG, CHOLHDL, LDLDIRECT in the last 72 hours. ? ?Lab Results  ?Component Value Date  ? HGBA1C 6.9 (H) 08/25/2021  ? ? ?No results for input(s): TSH, T4TO

## 2021-08-27 NOTE — Evaluation (Signed)
Occupational Therapy Evaluation ?Patient Details ?Name: Trevor Bailey ?MRN: [id number] ?DOB: [birth year] ?Today's Date: 08/27/2021 ? ? ?History of Present Illness 76 y.o. male inadvertently drank a bottle containing hand soap or mouthwash and was brought to ED 08/25/21. + GI bleed with Hgb 4.1 with 2 units PRBC transfused. Mild confusion with head CT negative. PMH CAD status post stenting, diabetes mellitus type 2, sleep apnea, atrial fibrillation on Eliquis, stage IV non-small cell lung cancer on chemoradiation  ? ?Clinical Impression ?  ?Pt currently min to min guard assist for toileting transfers and selfcare sit to stand.  Significant limitations in head posturing and neck ROM secondary to pain.  Pt also with left shoulder AROM limitations as well resulting in limited functional use.  Feel pt will benefit from acute care OT to increase ADL independence as well as increasing AROM and reducing pain.    ?   ? ?Recommendations for follow up therapy are one component of a multi-disciplinary discharge planning process, led by the attending physician.  Recommendations may be updated based on patient status, additional functional criteria and insurance authorization.  ? ?Follow Up Recommendations ? Home health OT  ?  ?Assistance Recommended at Discharge PRN  ?Patient can return home with the following A little help with walking and/or transfers;A little help with bathing/dressing/bathroom;Assistance with cooking/housework ? ?  ?Functional Status Assessment ? Patient has had a recent decline in their functional status and demonstrates the ability to make significant improvements in function in a reasonable and predictable amount of time.  ?Equipment Recommendations ? None recommended by OT  ?  ?   ?Precautions / Restrictions Precautions ?Precautions: Fall ?Precaution Comments: cervical and left shoulder limiations and pain secondary to radiation ?Restrictions ?Weight Bearing Restrictions: No  ? ?  ? ?Mobility Bed  Mobility ?Overal bed mobility: Needs Assistance ?Bed Mobility: Supine to Sit, Sit to Supine ?  ?  ?Supine to sit: Min assist ?Sit to supine: Supervision ?  ?General bed mobility comments: cueing for rolling to his right side with min facilitation to transition to sitting ?  ? ?Transfers ?Overall transfer level: Needs assistance ?Equipment used: None ?Transfers: Sit to/from Stand ?Sit to Stand: Min assist ?  ?  ?  ?  ?  ?General transfer comment: Pt with head tilt to the left and flexed in standing secondary to ROM changes and pain. ?  ? ?  ?Balance Overall balance assessment: Needs assistance ?Sitting-balance support: No upper extremity supported, Feet supported ?Sitting balance-Leahy Scale: Good ?  ?  ?Standing balance support: No upper extremity supported, During functional activity ?Standing balance-Leahy Scale: Fair ?  ?  ?  ?  ?  ?  ?  ?  ?  ?  ?  ?  ?   ? ?ADL either performed or assessed with clinical judgement  ? ?ADL Overall ADL's : Needs assistance/impaired ?Eating/Feeding: Independent;Sitting ?  ?Grooming: Wash/dry hands;Wash/dry face;Standing;Min guard ?Grooming Details (indicate cue type and reason): simulated ?  ?  ?  ?  ?  ?  ?Lower Body Dressing: Minimal assistance;Sit to/from stand ?  ?Toilet Transfer: Minimal assistance;Regular Toilet;Grab bars ?  ?Toileting- Clothing Manipulation and Hygiene: Minimal assistance;Sit to/from stand ?  ?  ?  ?Functional mobility during ADLs: Minimal assistance (No device) ?General ADL Comments: Pt limited by severe left neck and left shouder pain secondary to radiation treatments.  Keeps head laterally flexed to the left and forward in sitting with inability to actively maintain midline posture.  ? ? ? ?  Vision Baseline Vision/History: 1 Wears glasses ?Ability to See in Adequate Light: 0 Adequate ?Patient Visual Report: No change from baseline ?Vision Assessment?: No apparent visual deficits  ?   ?Perception Perception ?Perception: Within Functional Limits ?  ?Praxis  Praxis ?Praxis: Intact ?  ? ?Pertinent Vitals/Pain Pain Assessment ?Pain Assessment: Faces ?Pain Score: 5  ?Pain Location: left neck and shoulder ?Pain Descriptors / Indicators: Aching, Discomfort ?Pain Intervention(s): Limited activity within patient's tolerance, Repositioned  ? ? ? ?Hand Dominance Right ?  ?Extremity/Trunk Assessment Upper Extremity Assessment ?Upper Extremity Assessment: LUE deficits/detail ?LUE Deficits / Details: AROM elbow flexion/extenson WFLS with strength at 3+/5.  Grip 4/5 as well.  Shoulder flexion AROM 0-30 degrees with AAROM 0-60 degrees with end range pain secondary to recent radiation treatments. ?LUE: Shoulder pain with ROM ?LUE Sensation: WNL ?LUE Coordination: decreased gross motor ?  ?  ?  ?Cervical / Trunk Assessment ?Cervical / Trunk Assessment: Other exceptions ?Cervical / Trunk Exceptions: Pt holds head laterally tilted to the left secondary to recent and ongoing radiation treatments.  Increased swelling also noted in the left and posterior neck region. ?  ?Communication Communication ?Communication: No difficulties ?  ?Cognition Arousal/Alertness: Awake/alert ?Behavior During Therapy: [facility] for tasks assessed/performed ?Overall Cognitive Status: No family/caregiver present to determine baseline cognitive functioning ?  ?  ?  ?  ?  ?  ?  ?  ?  ?  ?  ?  ?  ?  ?  ?  ?General Comments: Pt oriented to month and year but not day of the week, which he missed by only one day. ?  ?  ?   ?   ?   ? ? ?Home Living Family/patient expects to be discharged to:: Private residence ?Living Arrangements: Spouse/significant other ?Available Help at Discharge: Family ?Type of Home: House ?Home Access: Stairs to enter ?Entrance Stairs-Number of Steps: 2 ?  ?Home Layout: One level ?  ?  ?Bathroom Shower/Tub: Walk-in shower ?  ?  ?  ?  ?Home Equipment: None;Shower seat - built in;Grab bars - toilet;Grab bars - tub/shower ?  ?  ?  ? ?  ?Prior Functioning/Environment Prior Level of Function :  Independent/Modified Independent ?  ?  ?  ?  ?  ?  ?  ?  ?  ? ?  ?  ?OT Problem List: Decreased strength;Decreased range of motion;Decreased coordination;Decreased activity tolerance;Pain;Impaired balance (sitting and/or standing) ?  ?   ?OT Treatment/Interventions: Self-care/ADL training;Manual therapy;Patient/family education;Balance training;Therapeutic activities;DME and/or AE instruction;Therapeutic exercise  ?  ?OT Goals(Current goals can be found in the care plan section) Acute Rehab OT Goals ?Patient Stated Goal: Pt wants his pain to get better and go home soon. ?OT Goal Formulation: With patient ?Time For Goal Achievement: 09/10/21 ?Potential to Achieve Goals: Good  ?OT Frequency: Min 2X/week ?  ? ?   ?AM-PAC OT "6 Clicks" Daily Activity     ?Outcome Measure Help from another person eating meals?: None ?Help from another person taking care of personal grooming?: A Little ?Help from another person toileting, which includes using toliet, bedpan, or urinal?: A Little ?Help from another person bathing (including washing, rinsing, drying)?: A Little ?Help from another person to put on and taking off regular upper body clothing?: A Little ?Help from another person to put on and taking off regular lower body clothing?: A Little ?6 Click Score: 19 ?  ?End of Session Equipment Utilized During Treatment: Gait belt ?Nurse Communication: Mobility status ? ?Activity Tolerance:  Patient limited by pain ?Patient left: in bed;with call bell/phone within reach;with bed alarm set ? ?OT Visit Diagnosis: Unsteadiness on feet (R26.81);Pain;Muscle weakness (generalized) (M62.81) ?Pain - Right/Left: Left ?Pain - part of body: Shoulder (neck)  ?              ?Time: 2620-3559 ?OT Time Calculation (min): 30 min ?Charges:  OT General Charges ?$OT Visit: 1 Visit ?OT Evaluation ?$OT Eval Moderate Complexity: 1 Mod ?OT Treatments ?$Self Care/Home Management : 8-22 mins ?Josanna Hefel OTR/L ?08/27/2021, 11:02 AM ?

## 2021-08-27 NOTE — TOC Initial Note (Signed)
Transition of Care (TOC) - Initial/Assessment Note  ? ? ?Patient Details  ?Name: Trevor Bailey ?MRN: [id number] ?Date of Birth: 05-Jan-1946 ? ?Transition of Care (TOC) CM/SW Contact:    ?Cyndi Bender, RN ?Phone Number: ?08/27/2021, 1:41 PM ? ?Clinical Narrative:            ?Spoke to patient regarding transition needs. Therapy recommends Home Health PT/OT. Patient defers to [facility] to find highly rated agency. Tommi Rumps with [facility] accepted referral. Order for walker. Spoke to [employer] with [employer] and walker will be delivered to home. ?Address, Phone number and PCP verified.  ?TOC to continue to follow for needs ? ?Needs Home health PT/OT orders. ? ? ?Patient Goals and CMS Choice ?Patient states their goals for this hospitalization and ongoing recovery are:: return home ?CMS Medicare.gov Compare Post Acute Care list provided to:: Patient ?Choice offered to / list presented to : Patient ? ?Expected Discharge Plan and Services ?Expected Discharge Plan: [place] ?  ?Discharge Planning Services: CM Consult ?Post Acute Care Choice: Durable Medical Equipment, Home Health ?Living arrangements for the past 2 months: [place] ?                ?DME Arranged: Walker ?DME Agency: [employer] ?Date DME Agency Contacted: 08/27/21 ?Time DME Agency Contacted: 1310 ?Representative spoke with at DME Agency: Maudie Mercury ?HH Arranged: PT, OT ?[place] Agency: [place] ?Date HH Agency Contacted: 08/27/21 ?Time [place]: 4696 ?Representative spoke with at [place]: Tommi Rumps ? ?Prior Living Arrangements/Services ?Living arrangements for the past 2 months: [place] ?Lives with:: Spouse ?Patient language and need for interpreter reviewed:: Yes ?Do you feel safe going back to the place where you live?: Yes      ?Need for Family Participation in Patient Care: Yes (Comment) ?Care giver support system in place?: Yes (comment) ?  ?Criminal Activity/Legal Involvement Pertinent to Current  Situation/Hospitalization: No - Comment as needed ? ?Activities of Daily Living ?Home Assistive Devices/Equipment: CPAP, Eyeglasses, Dentures (specify type) ?ADL Screening (condition at time of admission) ?Patient's cognitive ability adequate to safely complete daily activities?: Yes ?Is the patient deaf or have difficulty hearing?: No ?Does the patient have difficulty seeing, even when wearing glasses/contacts?: No ?Does the patient have difficulty concentrating, remembering, or making decisions?: No ?Patient able to express need for assistance with ADLs?: Yes ?Does the patient have difficulty dressing or bathing?: Yes ?Independently performs ADLs?: No ?Does the patient have difficulty walking or climbing stairs?: Yes ?Weakness of Legs: Both ?Weakness of Arms/Hands: Both ? ?Permission Sought/Granted ?Permission sought to share information with : Case Manager ?  ?   ? Permission granted to share info w AGENCY: [place] ?   ?   ? ?Emotional Assessment ?Appearance:: Appears stated age ?Attitude/Demeanor/Rapport: Engaged ?Affect (typically observed): Accepting ?Orientation: : Oriented to Self, Oriented to Place, Oriented to  Time, Oriented to Situation ?Alcohol / Substance Use: Not Applicable ?Psych Involvement: No (comment) ? ?Admission diagnosis:  Blood loss anemia [D50.0] ?Acute GI bleeding [K92.2] ?Ingestion of nontoxic substance, accidental or unintentional, initial encounter [T65.91XA] ?Patient Active Problem List  ? Diagnosis Date Noted  ? Acute GI bleeding 08/25/2021  ? Acute blood loss anemia 08/25/2021  ? 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 Neutropenia, drug-induced ([place]) 05/18/2019  ? 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 Encounter for antineoplastic immunotherapy 04/11/2019  ? 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  Goals of care, counseling/discussion 01/13/2019  ? Encounter for antineoplastic chemotherapy  01/13/2019  ? Mass of upper lobe of left lung 12/23/2018  ? Chest pain with moderate risk of acute coronary syndrome 03/12/2016  ? Type 2 diabetes mellitus ([place]) 10/07/2015  ? PVD (peripheral vascular disease) ([place]) 02/23/2014  ? Premature atrial contractions 02/09/2014  ? Atherosclerosis of native artery of extremity with intermittent claudication ([place]) 09/08/2013  ? CAD S/P percutaneous coronary angioplasty 07/04/2013  ? OSA on CPAP 07/04/2013  ? Essential hypertension 07/04/2013  ? Tobacco abuse 07/04/2013  ? Hyperlipidemia with target LDL less than 70 07/04/2013  ? ?PCP:  [name], MD ?Pharmacy:   ?[facility] [place], [address] ?[address] [place] ?[place] [address] ?Phone: [phone] Fax: [phone] ? ?[facility] ([place] ?[address] ?[address] ?[place] [address] ?Phone: [phone] Fax: [phone] ? ?[place] ([facility] ) - [place], [address] ?[address] ?[place] [address] ?Phone: [phone] Fax: [phone] ? ? ? ? ?Social Determinants of Health (SDOH) Interventions ?  ? ?Readmission Risk Interventions ?   ? View : No data to display.  ?  ?  ?  ? ? ? ?

## 2021-08-27 NOTE — Progress Notes (Signed)
Subjective: ?No abdominal pain. ?No further melena. ?Tolerating clear liquid diet. ? ?Objective: ?Vital signs in last 24 hours: ?Temp:  [97.7 ?F (36.5 ?C)-98.7 ?F (37.1 ?C)] 97.8 ?F (36.6 ?C) (04/11 0419) ?Pulse Rate:  [97-100] 100 (04/11 0419) ?Resp:  [16-20] 20 (04/11 0419) ?BP: (109-127)/(68-79) 109/73 (04/11 0419) ?SpO2:  [98 %-100 %] 98 % (04/11 0419) ?Weight change:  ?Last BM Date : 08/26/21 ? ?PE: ?GEN:  NAD ?ABD:  Soft, non-tender ? ?Lab Results: ?CBC ?   ?Component Value Date/Time  ? WBC 7.2 08/27/2021 0632  ? RBC 2.53 (L) 08/27/2021 9390  ? HGB 7.1 (L) 08/27/2021 3009  ? HGB 8.6 (L) 07/22/2021 1032  ? HCT 22.3 (L) 08/27/2021 2330  ? PLT 441 (H) 08/27/2021 0762  ? PLT 547 (H) 07/22/2021 1032  ? MCV 88.1 08/27/2021 0632  ? MCH 28.1 08/27/2021 0632  ? MCHC 31.8 08/27/2021 0632  ? RDW 17.0 (H) 08/27/2021 2633  ? LYMPHSABS 0.5 (L) 08/27/2021 0228  ? MONOABS 0.9 08/27/2021 0228  ? EOSABS 0.1 08/27/2021 0228  ? BASOSABS 0.0 08/27/2021 0228  ?CMP  ?   ?Component Value Date/Time  ? NA 137 08/27/2021 0228  ? K 3.6 08/27/2021 0228  ? CL 109 08/27/2021 0228  ? CO2 22 08/27/2021 0228  ? GLUCOSE 142 (H) 08/27/2021 0228  ? BUN 30 (H) 08/27/2021 0228  ? CREATININE 1.11 08/27/2021 0228  ? CREATININE 1.25 (H) 07/22/2021 1032  ? CREATININE 0.94 05/16/2014 1109  ? CALCIUM 8.8 (L) 08/27/2021 0228  ? PROT 5.2 (L) 08/27/2021 0228  ? ALBUMIN 1.6 (L) 08/27/2021 0228  ? AST 20 08/27/2021 0228  ? AST 14 (L) 07/22/2021 1032  ? ALT 17 08/27/2021 0228  ? ALT 15 07/22/2021 1032  ? ALKPHOS 72 08/27/2021 0228  ? BILITOT 0.7 08/27/2021 0228  ? BILITOT 0.3 07/22/2021 1032  ? GFRNONAA >60 08/27/2021 0228  ? GFRNONAA 60 (L) 07/22/2021 1032  ? GFRAA 48 (L) 02/08/2020 1115  ? ? ?Assessment: ? ? Acute blood loss anemia. ?Melena, resolved. ?Chronic anticoagulation (on hold). ?Large non-bleeding (at time of EGD) duodenal ulcer. ? ?Plan: ? ? CBCs and transfuse as needed. ?Advance diet to full liquids. ?Awaiting H. Pylori serologies (can be  addressed as outpatient). ?Hold Eliquis for 2-3 months; if restarted before then, he has high risk of rebleeding. ?If rebleeding or if compelling need to start Eliquis within the next couple months, then would consult IR for consideration of gastroduodenal artery embolizatoin. ?PPI gtt for another 24 hours, consider switch to IV/PO bid tomorrow. ?[facility] will follow. ? ?Trevor Bailey ?08/27/2021, 11:44 AM ? ? ?Cell [phone] ?If no answer or after 5 PM call [phone]  ?

## 2021-08-28 ENCOUNTER — Ambulatory Visit: Payer: Medicare Other

## 2021-08-28 ENCOUNTER — Other Ambulatory Visit: Payer: Self-pay

## 2021-08-28 ENCOUNTER — Ambulatory Visit
Admission: RE | Admit: 2021-08-28 | Discharge: 2021-08-28 | Disposition: A | Discharge status: Home / Self Care | Payer: Medicare Other | Source: Ambulatory Visit | Attending: Radiation Oncology | Admitting: Radiation Oncology

## 2021-08-28 DIAGNOSIS — C770 Secondary and unspecified malignant neoplasm of lymph nodes of head, face and neck: Secondary | ICD-10-CM | POA: Diagnosis not present

## 2021-08-28 DIAGNOSIS — Z87891 Personal history of nicotine dependence: Secondary | ICD-10-CM | POA: Diagnosis not present

## 2021-08-28 DIAGNOSIS — Z510 Encounter for antineoplastic radiation therapy: Secondary | ICD-10-CM | POA: Diagnosis not present

## 2021-08-28 DIAGNOSIS — C3412 Malignant neoplasm of upper lobe, left bronchus or lung: Secondary | ICD-10-CM | POA: Diagnosis not present

## 2021-08-28 LAB — H. PYLORI ANTIBODY, IGG: H Pylori IgG: 0.28 Index Value (ref 0.00–0.79)

## 2021-08-28 LAB — BPAM RBC
Blood Product Expiration Date: 202305062359
Blood Product Expiration Date: 202305062359
Blood Product Expiration Date: 202305062359
Blood Product Expiration Date: 202305062359
ISSUE DATE / TIME: 202304090523
ISSUE DATE / TIME: 202304090936
ISSUE DATE / TIME: 202304101545
ISSUE DATE / TIME: 202304111305
Unit Type and Rh: 6200
Unit Type and Rh: 6200
Unit Type and Rh: 6200
Unit Type and Rh: 6200

## 2021-08-28 LAB — COMPREHENSIVE METABOLIC PANEL
ALT: 24 U/L (ref 0–44)
AST: 27 U/L (ref 15–41)
Albumin: 1.6 g/dL — ABNORMAL LOW (ref 3.5–5.0)
Alkaline Phosphatase: 89 U/L (ref 38–126)
Anion gap: 8 (ref 5–15)
BUN: 18 mg/dL (ref 8–23)
CO2: 22 mmol/L (ref 22–32)
Calcium: 8.8 mg/dL — ABNORMAL LOW (ref 8.9–10.3)
Chloride: 108 mmol/L (ref 98–111)
Creatinine, Ser: 0.92 mg/dL (ref 0.61–1.24)
GFR, Estimated: >60 mL/min (ref >60)
Glucose, Bld: 115 mg/dL — ABNORMAL HIGH (ref 70–99)
Potassium: 3.6 mmol/L (ref 3.5–5.1)
Sodium: 138 mmol/L (ref 135–145)
Total Bilirubin: 0.9 mg/dL (ref 0.3–1.2)
Total Protein: 5.3 g/dL — ABNORMAL LOW (ref 6.5–8.1)

## 2021-08-28 LAB — TYPE AND SCREEN
ABO/RH(D): A POS
Antibody Screen: NEGATIVE
Unit division: 0
Unit division: 0
Unit division: 0
Unit division: 0

## 2021-08-28 LAB — GLUCOSE, CAPILLARY
Glucose-Capillary: 117 mg/dL — ABNORMAL HIGH (ref 70–99)
Glucose-Capillary: 119 mg/dL — ABNORMAL HIGH (ref 70–99)
Glucose-Capillary: 136 mg/dL — ABNORMAL HIGH (ref 70–99)
Glucose-Capillary: 146 mg/dL — ABNORMAL HIGH (ref 70–99)
Glucose-Capillary: 157 mg/dL — ABNORMAL HIGH (ref 70–99)
Glucose-Capillary: 176 mg/dL — ABNORMAL HIGH (ref 70–99)
Glucose-Capillary: 95 mg/dL (ref 70–99)

## 2021-08-28 LAB — CBC WITH DIFFERENTIAL/PLATELET
Abs Immature Granulocytes: 0.03 10*3/uL (ref 0.00–0.07)
Basophils Absolute: 0 10*3/uL (ref 0.0–0.1)
Basophils Relative: 0 %
Eosinophils Absolute: 0.2 10*3/uL (ref 0.0–0.5)
Eosinophils Relative: 2 %
HCT: 27.7 % — ABNORMAL LOW (ref 39.0–52.0)
Hemoglobin: 9.2 g/dL — ABNORMAL LOW (ref 13.0–17.0)
Immature Granulocytes: 0 %
Lymphocytes Relative: 5 %
Lymphs Abs: 0.4 10*3/uL — ABNORMAL LOW (ref 0.7–4.0)
MCH: 29.2 pg (ref 26.0–34.0)
MCHC: 33.2 g/dL (ref 30.0–36.0)
MCV: 87.9 fL (ref 80.0–100.0)
Monocytes Absolute: 0.8 10*3/uL (ref 0.1–1.0)
Monocytes Relative: 10 %
Neutro Abs: 6.4 10*3/uL (ref 1.7–7.7)
Neutrophils Relative %: 83 %
Platelets: 441 10*3/uL — ABNORMAL HIGH (ref 150–400)
RBC: 3.15 MIL/uL — ABNORMAL LOW (ref 4.22–5.81)
RDW: 16.9 % — ABNORMAL HIGH (ref 11.5–15.5)
WBC: 7.8 10*3/uL (ref 4.0–10.5)
nRBC: 0.9 % — ABNORMAL HIGH (ref 0.0–0.2)

## 2021-08-28 LAB — MAGNESIUM: Magnesium: 2 mg/dL (ref 1.7–2.4)

## 2021-08-28 MED ORDER — PANTOPRAZOLE SODIUM 40 MG PO TBEC
40.0000 mg | DELAYED_RELEASE_TABLET | Freq: Two times a day (BID) | ORAL | Status: DC
  Administered 2021-08-28 – 2021-08-29 (×2): 40 mg via ORAL
  Filled 2021-08-28 (×2): qty 1

## 2021-08-28 NOTE — Progress Notes (Signed)
Occupational Therapy Treatment ?Patient Details ?Name: Trevor Bailey ?MRN: [id number] ?DOB: [birth year] ?Today's Date: 08/28/2021 ? ? ?History of present illness 76 y.o. male inadvertently drank a bottle containing hand soap or mouthwash and was brought to ED 08/25/21. + GI bleed with Hgb 4.1 with 2 units PRBC transfused. Mild confusion with head CT negative. 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  ?OT comments ? Pt completed gentle AROM/AAROM in cervical extension, rotation, and lateral flexion to help increase AROM.  Increased pain in the left neck and upper shoulder region with any active movement.  HeTends to still keep his head tilted to the left and flexed with any functional mobility.  Will continue to follow for acute OT needs.  ? ?Recommendations for follow up therapy are one component of a multi-disciplinary discharge planning process, led by the attending physician.  Recommendations may be updated based on patient status, additional functional criteria and insurance authorization. ?   ?Follow Up Recommendations ? Home health OT  ?  ?Assistance Recommended at Discharge PRN  ?Patient can return home with the following ? A little help with walking and/or transfers;A little help with bathing/dressing/bathroom;Assistance with cooking/housework ?  ?Equipment Recommendations ? None recommended by OT  ?  ?   ?Precautions / Restrictions Precautions ?Precautions: Fall ?Precaution Comments: cervical and left shoulder limitations and pain secondary to radiation ?Restrictions ?Weight Bearing Restrictions: No  ? ? ?  ? ?Mobility Bed Mobility ?Overal bed mobility: Needs Assistance ?Bed Mobility: Supine to Sit, Sit to Supine ?  ?  ?Supine to sit: Min assist ?Sit to supine: Supervision ?  ?General bed mobility comments: Mod instructional cueing for attempting to roll to the right side and then transition to  sitting. ?  ? ?Transfers ?Overall transfer level: Needs assistance ?Equipment used: None ?Transfers: Sit to/from Stand ?Sit to Stand: Min assist, Min guard ?  ?  ?  ?  ?  ?General transfer comment: Pt with head tilt to the left and flexed in standing and during mobility secondary to pain and ROM limitations. ?  ?  ?Balance Overall balance assessment: Needs assistance ?Sitting-balance support: No upper extremity supported, Feet supported ?Sitting balance-Leahy Scale: Good ?  ?  ?Standing balance support: No upper extremity supported, During functional activity ?Standing balance-Leahy Scale: Fair ?  ?  ?  ?  ?  ?  ?  ?  ?  ?  ?  ?  ?   ? ?ADL either performed or assessed with clinical judgement  ? ?ADL   ?  ?  ?  ?  ?  ?  ?  ?  ?  ?  ?  ?  ?  ?  ?  ?  ?  ?  ?  ?General ADL Comments: Pt declined need to toilet or work on any selfcare retraining.  Therapist had him work in sitting and in supine on AAROM stretches for his neck.  He was only able to tolerate sitting for approximately 5 mins with completion of one set of cervical extension for 2-3 reps with mod instructional cueing to place head to midline while trying to complete extension.  He then returned to supine with supervision and worked on lateral cervical flexion to the right as well as rotation to the right and left.  Provided handout for reference with cueing to provide gentle prolonged stretch of 30 seconds or more. Instructed him  to work on this daily.   Wife present for this as well.  Finished session with transition back to sitting and functional mobility over to the door and back X2 with min guard assist and no device.  Pt maintaining head tilt to the left with cervical flexion as well.  HR increasing up to 115 with activity.  Transitioned back to bed to complete session. ?  ? ? ?   ?   ?   ? ?Cognition Arousal/Alertness: Awake/alert ?Behavior During Therapy: [facility] for tasks assessed/performed ?Overall Cognitive Status: Within Functional Limits for tasks  assessed ?  ?  ?  ?  ?  ?  ?  ?  ?  ?  ?  ?  ?  ?  ?  ?  ?  ?  ?  ?   ?   ?   ?   ? ? ?Pertinent Vitals/ Pain       Pain Assessment ?Pain Assessment: Faces ?Faces Pain Scale: Hurts whole lot ?Pain Location: left neck and shoulder ?Pain Descriptors / Indicators: Aching, Discomfort, Grimacing, Crying ?Pain Intervention(s): Limited activity within patient's tolerance, Repositioned ? ?   ?   ? ?Frequency ? Min 2X/week  ? ? ? ? ?  ?Progress Toward Goals ? ?OT Goals(current goals can now be found in the care plan section) ? Progress towards OT goals: Progressing toward goals ? ?Acute Rehab OT Goals ?Patient Stated Goal: Pain decrease and transition home soon hopefully. ?OT Goal Formulation: With patient ?Time For Goal Achievement: 09/10/21 ?Potential to Achieve Goals: Good  ?Plan Discharge plan remains appropriate   ? ?   ?AM-PAC OT "6 Clicks" Daily Activity     ?Outcome Measure ? ? Help from another person eating meals?: None ?Help from another person taking care of personal grooming?: A Little ?Help from another person toileting, which includes using toliet, bedpan, or urinal?: A Little ?Help from another person bathing (including washing, rinsing, drying)?: A Little ?Help from another person to put on and taking off regular upper body clothing?: A Little ?Help from another person to put on and taking off regular lower body clothing?: A Little ?6 Click Score: 19 ? ?  ?End of Session Equipment Utilized During Treatment: Gait belt ? ?OT Visit Diagnosis: Unsteadiness on feet (R26.81);Pain;Muscle weakness (generalized) (M62.81) ?Pain - Right/Left: Left ?Pain - part of body: Shoulder ?  ?Activity Tolerance Patient limited by pain ?  ?Patient Left in bed;with call bell/phone within reach;with bed alarm set;with family/visitor present ?  ?Nurse Communication Mobility status ?  ? ?   ? ?Time: 3491-7915 ?OT Time Calculation (min): 39 min ? ?Charges: OT General Charges ?$OT Visit: 1 Visit ?OT Treatments ?$Therapeutic Activity:  38-52 mins ? ? ?Stonewall Doss OTR/L ?08/28/2021, 12:50 PM ?

## 2021-08-28 NOTE — Progress Notes (Signed)
?                                  PROGRESS NOTE                                             ?                                                                                                                     ?                                         ? ? Patient Demographics:  ? ? Trevor Bailey, is a 76 y.o. male, DOB - 12-Jul-1945, DZH:[id number] ? ?Outpatient Primary MD for the patient is [name], MD    LOS - 3  Admit date - 08/24/2021   ? ?Chief Complaint  ?Patient presents with  ? Ingestion  ?  Pt was diagnosed with lung cancer 5 weeks and has chronic pain, reports tonight he was in "so much pain that I drunk hand soap and mouthwash so I would stop hurting" denies SI/HI.   ? Diarrhea  ?  After drinking the hand soap pt reports "diarrhea"   ?    ? ?Brief Narrative (HPI from H&P)  - 76 y.o. 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 ? ? Subjective:  ? ?Patient in bed, appears comfortable, denies any headache, no fever, no chest pain or pressure, no shortness of breath , no abdominal pain. No new focal weakness.  Chronic left-sided neck pain at the site of radiation. ? ? ? Assessment  & Plan :  ? ?Acute GI bleed likely upper GI given that patient was having dark tarry stools in the ER -   Dr. Michail Sermon on-call gastroenterologist for [facility] on board he is getting 3 units of packed RBC last on 08/26/2021, will give another unit on 08/27/2021, do not think he is actively bleeding right now, EGD showed  large duodenal ulcer, will continue IV PPI drip for now.  Continue to monitor CBC.  If bleeding reoccurs will consult IR for embolization.  Continue to hold Eliquis for at least 3 more months.  Discontinue aspirin f permanently. ?Acute blood loss  anemia hemoglobin around 4 last hemoglobin in our system was around 8.  Plan as #1 above. ?Inadvertently drinking possibly hand soap versus mouthwash.  Patient states he had a lot of pain and he inadvertently drank it.  Denies any suicidal ideation.  I requested the patient's wife to bring the bottle.  They are saying they are looking for it.  Supportive care. ?History of CAD status post stenting denies any chest pain. ?History of A-fib presently patient is tachycardic.  Appears sinus.  Will get EKG closely monitor. Eliquis held as in #1 above.  Family not sure when the last dose was.  Mali vas 2 score is at least greater than 3 will have him follow-up with cardiology for possible Watchman device evaluation in the outpatient setting. 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  Currently undergoing radiation treatments at [facility] long.  Has some neck discomfort due to that which will be managed here with supportive care.  Discussed the case with radiation oncology team on 08/27/2021 they do not think that radiation treatments are contributing to his GI pathology.   ?Diabetes mellitus type 2.  On sliding scale. ? ?CBG (last 3)  ?Recent Labs  ?  08/27/21 ?2326 08/28/21 ?3810 08/28/21 ?0748  ?GLUCAP 122* 136* 119*  ? ? ?Lab Results  ?Component Value Date  ? HGBA1C 6.9 (H) 08/25/2021  ? ? ?Stage IV lung cancer receiving chemoradiation.  Being followed by Dr. Julien Nordmann oncologist.  His team will be informed of patient's hospitalization. ? ?8.  Hypokalemia.  Replaced. ? ?   ? ?Condition - Extremely Guarded ? ?Family Communication  :  wife bedside on 08/25/2021 ? ?Code Status : Full ? ?Consults  : [facility] ? ?PUD Prophylaxis : PPI IV ? ? Procedures   :    ? ?EGD -  ? ?Impression:               - Normal esophagus. ?                          - Normal stomach. ?                          - One large, deep, non-bleeding duodenal ulcer with  ?                          pigmented material in duodenal bulb. ?                          - No specimens collected. ?Recommendation:           - Return patient to hospital ward for ongoing care. ?                          - Clear liquid diet today. ?                          - Continue PPI gtt for at least the next 48 hours. ?                          - Patient VERY HIGH RISK for rebleeding; would stay  ?  off Eliquis for at least the next 2-3 months; if  ?                          this is not possible, then there is high risks of  ?                          rebleeding, and I would suggest that IR be called  ?                          to consider empiric embolization of duodenal bulb.  ?                          This ulcer is NOT amenable to endoscopic therapy. ?                          - [facility] will follow. ? ?CT head.  Nonacute. ? ?   ? ?Disposition Plan  :   ? ?Status is: Inpatient ? ? ?DVT Prophylaxis  :   ? ?SCDs Start: 08/25/21 0556 ?  ? ?Lab Results  ?Component Value Date  ? PLT 441 (H) 08/28/2021  ? ? ?Diet :  ?Diet Order   ? ?       ?  DIET DYS 3 Room service appropriate? Yes; Fluid consistency: Thin  Diet effective now       ?  ? ?  ?  ? ?  ?  ? ?Inpatient Medications ? ?Scheduled Meds: ? chlorhexidine  15 mL Mouth Rinse BID  ? insulin aspart  0-6 Units Subcutaneous Q4H  ? latanoprost  1 drop Both Eyes QHS  ? mouth rinse  15 mL Mouth Rinse q12n4p  ? pantoprazole  40 mg Oral BID AC  ? ?Continuous Infusions: ? ? ?PRN Meds:.morphine injection, traMADol ? ?Antibiotics  :   ? ?Anti-infectives (From admission, onward)  ? ? None  ? ?  ? ? ? Time Spent in minutes  30 ? ? ?Lala Lund M.D on 08/28/2021 at 9:10 AM ? ?To page go to [email or web address]  ? ?[facility] -  Office  [phone] ? ?See all  Orders from today for further details ? ? ? Objective:  ? ?Vitals:  ? 08/27/21 2323 08/28/21 0000 08/28/21 0317 08/28/21 0746  ?BP: 132/79 115/76 126/76 128/79  ?Pulse: 95 95 98 98  ?Resp: (!) 23 20 18 18   ?Temp: 98.6 ?F (37 ?C)  98.4 ?F (36.9 ?C) 98 ?F (36.7 ?C)  ?TempSrc: Oral  Oral Oral  ?SpO2: 97% 97% 97% 97%  ?Weight:      ?Height:      ? ? ?Wt Readings from Last 3 Encounters:  ?08/26/21 72.6 kg  ?08/06/21 74.4 kg  ?08/05/21 74.6 kg  ? ? ? ?Intake/Output Summary (Last 24 hours) at 08/28/2021 0910 ?Last data filed at 08/28/2021 0800 ?Gross per 24 hour  ?Intake 1397.5 ml  ?Output 100 ml  ?Net 1297.5 ml  ? ? ? ?Physical Exam ? ?Awake Alert, No new F.N deficits, Normal affect ?[place].AT,PERRAL ?Supple Neck, No JVD,   ?Symmetrical Chest wall movement, Good air movement bilaterally, CTAB ?RRR,No Gallops, Rubs or new Murmurs,  ?+ve B.Sounds, Abd Soft, No tenderness,   ?No Cyanosis, Clubbing or edema  ? ? Data Review:  ? ? ?CBC ?Recent Labs  ?  Lab 08/25/21 ?0322 08/25/21 ?1400 08/26/21 ?2836 08/26/21 ?1454 08/27/21 ?0228 08/27/21 ?6294 08/27/21 ?2136 08/28/21 ?0100  ?WBC 11.4*   < > 10.0 9.6 7.7 7.2 7.7 7.8  ?HGB 4.1*   < > 7.0* 7.2* 7.4* 7.1* 9.1* 9.2*  ?HCT 14.0*   < > 22.0* 22.3* 23.0* 22.3* 27.8* 27.7*  ?PLT 580*   < > 518* 500* 417* 441* 424* 441*  ?MCV 83.3   < > 84.9 86.1 86.5 88.1 88.0 87.9  ?MCH 24.4*   < > 27.0 27.8 27.8 28.1 28.8 29.2  ?MCHC 29.3*   < > 31.8 32.3 32.2 31.8 32.7 33.2  ?RDW 18.6*   < > 17.4* 17.8* 16.8* 17.0* 16.8* 16.9*  ?LYMPHSABS 0.5*  --  0.5*  --  0.5*  --   --  0.4*  ?MONOABS 0.9  --  1.0  --  0.9  --   --  0.8  ?EOSABS 0.0  --  0.0  --  0.1  --   --  0.2  ?BASOSABS 0.0  --  0.0  --  0.0  --   --  0.0  ? < > = values in this interval not displayed.  ? ? ?Electrolytes ?Recent Labs  ?Lab 08/25/21 ?0230 08/25/21 ?0602 08/26/21 ?7654 08/27/21 ?0228 08/28/21 ?0100  ?NA 137  --  143 137 138  ?K 4.6  --  3.3* 3.6 3.6  ?CL 102  --  111 109 108  ?CO2 22  --  22 22 22   ?GLUCOSE 297*  --  161* 142* 115*   ?BUN 75*  --  55* 30* 18  ?CREATININE 1.20  --  1.15 1.11 0.92  ?CALCIUM 9.2  --  8.8* 8.8* 8.8*  ?AST 16  --  15 20 27   ?ALT 14  --  15 17 24   ?ALKPHOS 75  --  65 72 89  ?BILITOT 0.3  --  0.5 0.7 0.9

## 2021-08-28 NOTE — Progress Notes (Signed)
Subjective: ?No abdominal pain. ?Tolerating diet. ?No blood in stool. ? ?Objective: ?Vital signs in last 24 hours: ?Temp:  [97.9 ?F (36.6 ?C)-99.3 ?F (37.4 ?C)] 98 ?F (36.7 ?C) (04/12 0746) ?Pulse Rate:  [92-99] 98 (04/12 0746) ?Resp:  [17-23] 18 (04/12 0746) ?BP: (106-132)/(74-79) 128/79 (04/12 0746) ?SpO2:  [96 %-97 %] 97 % (04/12 0746) ?Weight change:  ?Last BM Date : 08/26/21 ? ?PE: ?GEN:  NAD ?ABD:  Soft, non-tender ? ?Lab Results: ?CBC ?   ?Component Value Date/Time  ? WBC 7.8 08/28/2021 0100  ? RBC 3.15 (L) 08/28/2021 0100  ? HGB 9.2 (L) 08/28/2021 0100  ? HGB 8.6 (L) 07/22/2021 1032  ? HCT 27.7 (L) 08/28/2021 0100  ? PLT 441 (H) 08/28/2021 0100  ? PLT 547 (H) 07/22/2021 1032  ? MCV 87.9 08/28/2021 0100  ? MCH 29.2 08/28/2021 0100  ? MCHC 33.2 08/28/2021 0100  ? RDW 16.9 (H) 08/28/2021 0100  ? LYMPHSABS 0.4 (L) 08/28/2021 0100  ? MONOABS 0.8 08/28/2021 0100  ? EOSABS 0.2 08/28/2021 0100  ? BASOSABS 0.0 08/28/2021 0100  ?CMP  ?   ?Component Value Date/Time  ? NA 138 08/28/2021 0100  ? K 3.6 08/28/2021 0100  ? CL 108 08/28/2021 0100  ? CO2 22 08/28/2021 0100  ? GLUCOSE 115 (H) 08/28/2021 0100  ? BUN 18 08/28/2021 0100  ? CREATININE 0.92 08/28/2021 0100  ? CREATININE 1.25 (H) 07/22/2021 1032  ? CREATININE 0.94 05/16/2014 1109  ? CALCIUM 8.8 (L) 08/28/2021 0100  ? PROT 5.3 (L) 08/28/2021 0100  ? ALBUMIN 1.6 (L) 08/28/2021 0100  ? AST 27 08/28/2021 0100  ? AST 14 (L) 07/22/2021 1032  ? ALT 24 08/28/2021 0100  ? ALT 15 07/22/2021 1032  ? ALKPHOS 89 08/28/2021 0100  ? BILITOT 0.9 08/28/2021 0100  ? BILITOT 0.3 07/22/2021 1032  ? GFRNONAA >60 08/28/2021 0100  ? GFRNONAA 60 (L) 07/22/2021 1032  ? GFRAA 48 (L) 02/08/2020 1115  ? ?Assessment: ? ? Acute blood loss anemia. ?Melena, resolved. ?Chronic anticoagulation (on hold). ?Large non-bleeding (at time of EGD) duodenal ulcer. ? ?Plan: ? ? Still awaiting H. Pylori serologies. ?Advance diet. ?Transition to oral pantoprazole 40 mg po bid. ?No apixaban for at least next 2  months; if cessation of this length not possible, then would consult IR for consideration of GDA embolization. ?If no issues by tomorrow, with advance in diet and change to oral PPI, could consider discharge home from GI perspective. ?[facility] will sign-off; we will arrange outpatient GI follow-up; thank you for consultation. ? ? [name] ?08/28/2021, 8:44 AM ? ? ?Cell [phone] ?If no answer or after 5 PM call [phone]  ?

## 2021-08-28 NOTE — Plan of Care (Signed)

## 2021-08-28 NOTE — Progress Notes (Signed)
PT Cancellation Note ? ?Patient Details ?Name: Trevor Bailey ?MRN: [id number] ?DOB: Mar 19, 1946 ? ? ?Cancelled Treatment:    Reason Eval/Treat Not Completed: Patient at procedure or test/unavailable [facility] present to transport for chemo. PT will re-attempt as time allows.  ? ?Kris No A. Gilford Rile, PT, DPT ?Acute Rehabilitation Services ?Pager [phone] ?Office [phone] ? ? ? ?Trevor Bailey A Leane Loring ?08/28/2021, 12:49 PM ?

## 2021-08-28 NOTE — Progress Notes (Signed)
Patient back from Outside procedure. Helped him to the bathroom. No complaints or concerns stated at this time. ?

## 2021-08-28 NOTE — Plan of Care (Signed)
?  Problem: Education: ?Goal: Knowledge of General Education information will improve ?Description: Including pain rating scale, medication(s)/side effects and non-pharmacologic comfort measures ?08/28/2021 1537 by Iverson Alamin, RN ?Outcome: Progressing ?08/28/2021 1447 by Iverson Alamin, RN ?Outcome: Progressing ?  ?Problem: Health Behavior/Discharge Planning: ?Goal: Ability to manage health-related needs will improve ?08/28/2021 1537 by Iverson Alamin, RN ?Outcome: Progressing ?08/28/2021 1447 by Iverson Alamin, RN ?Outcome: Progressing ?  ?Problem: Clinical Measurements: ?Goal: Ability to maintain clinical measurements within normal limits will improve ?08/28/2021 1537 by Iverson Alamin, RN ?Outcome: Progressing ?08/28/2021 1447 by Iverson Alamin, RN ?Outcome: Progressing ?Goal: Will remain free from infection ?08/28/2021 1537 by Iverson Alamin, RN ?Outcome: Progressing ?08/28/2021 1447 by Iverson Alamin, RN ?Outcome: Progressing ?Goal: Diagnostic test results will improve ?08/28/2021 1537 by Iverson Alamin, RN ?Outcome: Progressing ?08/28/2021 1447 by Iverson Alamin, RN ?Outcome: Progressing ?Goal: Respiratory complications will improve ?08/28/2021 1537 by Iverson Alamin, RN ?Outcome: Progressing ?08/28/2021 1447 by Iverson Alamin, RN ?Outcome: Progressing ?Goal: Cardiovascular complication will be avoided ?08/28/2021 1537 by Iverson Alamin, RN ?Outcome: Progressing ?08/28/2021 1447 by Iverson Alamin, RN ?Outcome: Progressing ?  ?Problem: Activity: ?Goal: Risk for activity intolerance will decrease ?08/28/2021 1537 by Iverson Alamin, RN ?Outcome: Progressing ?08/28/2021 1447 by Iverson Alamin, RN ?Outcome: Progressing ?  ?Problem: Nutrition: ?Goal: Adequate nutrition will be maintained ?08/28/2021 1537 by Iverson Alamin, RN ?Outcome: Progressing ?08/28/2021 1447 by Iverson Alamin, RN ?Outcome: Progressing ?  ?Problem: Coping: ?Goal: Level of anxiety will decrease ?08/28/2021 1537 by Iverson Alamin, RN ?Outcome:  Progressing ?08/28/2021 1447 by Iverson Alamin, RN ?Outcome: Progressing ?  ?Problem: Elimination: ?Goal: Will not experience complications related to bowel motility ?08/28/2021 1537 by Iverson Alamin, RN ?Outcome: Progressing ?08/28/2021 1447 by Iverson Alamin, RN ?Outcome: Progressing ?Goal: Will not experience complications related to urinary retention ?08/28/2021 1537 by Iverson Alamin, RN ?Outcome: Progressing ?08/28/2021 1447 by Iverson Alamin, RN ?Outcome: Progressing ?  ?Problem: Pain Managment: ?Goal: General experience of comfort will improve ?08/28/2021 1537 by Iverson Alamin, RN ?Outcome: Progressing ?08/28/2021 1447 by Iverson Alamin, RN ?Outcome: Progressing ?  ?Problem: Safety: ?Goal: Ability to remain free from injury will improve ?08/28/2021 1537 by Iverson Alamin, RN ?Outcome: Progressing ?08/28/2021 1447 by Iverson Alamin, RN ?Outcome: Progressing ?  ?Problem: Skin Integrity: ?Goal: Risk for impaired skin integrity will decrease ?08/28/2021 1537 by Iverson Alamin, RN ?Outcome: Progressing ?08/28/2021 1447 by Iverson Alamin, RN ?Outcome: Progressing ?  ?

## 2021-08-28 NOTE — Care Management Important Message (Signed)
Important Message ? ?Patient Details  ?Name: Trevor Bailey ?MRN: [id number] ?Date of Birth: Apr 03, 1946 ? ? ?Medicare Important Message Given:  Yes ? ? ? ? ?[name] ?08/28/2021, 3:16 PM ?

## 2021-08-29 ENCOUNTER — Ambulatory Visit: Payer: Medicare Other

## 2021-08-29 ENCOUNTER — Encounter: Payer: Self-pay | Admitting: Internal Medicine

## 2021-08-29 ENCOUNTER — Other Ambulatory Visit (HOSPITAL_COMMUNITY): Payer: Self-pay

## 2021-08-29 ENCOUNTER — Ambulatory Visit
Admission: RE | Admit: 2021-08-29 | Discharge: 2021-08-29 | Disposition: A | Discharge status: Home / Self Care | Payer: Medicare Other | Source: Ambulatory Visit | Attending: Radiation Oncology | Admitting: Radiation Oncology

## 2021-08-29 DIAGNOSIS — Z87891 Personal history of nicotine dependence: Secondary | ICD-10-CM | POA: Diagnosis not present

## 2021-08-29 DIAGNOSIS — C770 Secondary and unspecified malignant neoplasm of lymph nodes of head, face and neck: Secondary | ICD-10-CM | POA: Diagnosis not present

## 2021-08-29 DIAGNOSIS — Z510 Encounter for antineoplastic radiation therapy: Secondary | ICD-10-CM | POA: Diagnosis not present

## 2021-08-29 DIAGNOSIS — C3412 Malignant neoplasm of upper lobe, left bronchus or lung: Secondary | ICD-10-CM | POA: Diagnosis not present

## 2021-08-29 LAB — CBC WITH DIFFERENTIAL/PLATELET
Abs Immature Granulocytes: 0.06 10*3/uL (ref 0.00–0.07)
Basophils Absolute: 0 10*3/uL (ref 0.0–0.1)
Basophils Relative: 0 %
Eosinophils Absolute: 0.1 10*3/uL (ref 0.0–0.5)
Eosinophils Relative: 1 %
HCT: 28.2 % — ABNORMAL LOW (ref 39.0–52.0)
Hemoglobin: 9.2 g/dL — ABNORMAL LOW (ref 13.0–17.0)
Immature Granulocytes: 1 %
Lymphocytes Relative: 6 %
Lymphs Abs: 0.5 10*3/uL — ABNORMAL LOW (ref 0.7–4.0)
MCH: 29 pg (ref 26.0–34.0)
MCHC: 32.6 g/dL (ref 30.0–36.0)
MCV: 89 fL (ref 80.0–100.0)
Monocytes Absolute: 0.9 10*3/uL (ref 0.1–1.0)
Monocytes Relative: 11 %
Neutro Abs: 6.4 10*3/uL (ref 1.7–7.7)
Neutrophils Relative %: 81 %
Platelets: 446 10*3/uL — ABNORMAL HIGH (ref 150–400)
RBC: 3.17 MIL/uL — ABNORMAL LOW (ref 4.22–5.81)
RDW: 17.2 % — ABNORMAL HIGH (ref 11.5–15.5)
WBC: 8 10*3/uL (ref 4.0–10.5)
nRBC: 0 % (ref 0.0–0.2)

## 2021-08-29 LAB — COMPREHENSIVE METABOLIC PANEL
ALT: 30 U/L (ref 0–44)
AST: 30 U/L (ref 15–41)
Albumin: 1.6 g/dL — ABNORMAL LOW (ref 3.5–5.0)
Alkaline Phosphatase: 102 U/L (ref 38–126)
Anion gap: 8 (ref 5–15)
BUN: 19 mg/dL (ref 8–23)
CO2: 22 mmol/L (ref 22–32)
Calcium: 8.9 mg/dL (ref 8.9–10.3)
Chloride: 107 mmol/L (ref 98–111)
Creatinine, Ser: 0.96 mg/dL (ref 0.61–1.24)
GFR, Estimated: >60 mL/min (ref >60)
Glucose, Bld: 106 mg/dL — ABNORMAL HIGH (ref 70–99)
Potassium: 3.5 mmol/L (ref 3.5–5.1)
Sodium: 137 mmol/L (ref 135–145)
Total Bilirubin: 1.1 mg/dL (ref 0.3–1.2)
Total Protein: 5.5 g/dL — ABNORMAL LOW (ref 6.5–8.1)

## 2021-08-29 LAB — MAGNESIUM: Magnesium: 1.9 mg/dL (ref 1.7–2.4)

## 2021-08-29 LAB — GLUCOSE, CAPILLARY: Glucose-Capillary: 118 mg/dL — ABNORMAL HIGH (ref 70–99)

## 2021-08-29 MED ORDER — POTASSIUM CHLORIDE CRYS ER 20 MEQ PO TBCR
40.0000 meq | EXTENDED_RELEASE_TABLET | Freq: Once | ORAL | Status: AC
  Administered 2021-08-29: 40 meq via ORAL
  Filled 2021-08-29: qty 2

## 2021-08-29 MED ORDER — PANTOPRAZOLE SODIUM 40 MG PO TBEC
40.0000 mg | DELAYED_RELEASE_TABLET | Freq: Two times a day (BID) | ORAL | 2 refills | Status: DC
  Filled 2021-08-29: qty 60, 30d supply, fill #0

## 2021-08-29 MED ORDER — TRAMADOL HCL 50 MG PO TABS
50.0000 mg | ORAL_TABLET | Freq: Two times a day (BID) | ORAL | 0 refills | Status: DC | PRN
Start: 2021-08-29 — End: 2021-09-14
  Filled 2021-08-29: qty 10, 5d supply, fill #0

## 2021-08-29 NOTE — Progress Notes (Signed)
Patient is being discharged this morning. Discharge instructions and education provided to patient and significant other.  ?

## 2021-08-29 NOTE — Discharge Summary (Signed)
?                                                                                ? Trevor Bailey PXT:[id number] DOB: 1945-11-09 DOA: 08/24/2021 ? ?PCP: [name], MD ? ?Admit date: 08/24/2021  Discharge date: 08/29/2021 ? ?Admitted From: Home   Disposition:  Home ? ? ?Recommendations for Outpatient Follow-up:  ? ?Follow up with PCP in 1-2 weeks ? ?PCP Please obtain BMP/CBC, 2 view CXR in 1week,  (see Discharge instructions)  ? ?PCP Please follow up on the following pending results: No aspirin or Eliquis till cleared by GI to do so, monitor CBC closely. ? ? ?Home Health: PT,OT  ?Equipment/Devices: walker  ?Consultations: GI ?Discharge Condition: Stable    ?CODE STATUS: Full    ?Diet Recommendation: Heart Healthy - Soft ?  ? ?Chief Complaint  ?Patient presents with  ? Ingestion  ?  Pt was diagnosed with lung cancer 5 weeks and has chronic pain, reports tonight he was in "so much pain that I drunk hand soap and mouthwash so I would stop hurting" denies SI/HI.   ? Diarrhea  ?  After drinking the hand soap pt reports "diarrhea"   ?  ? ?Brief history of present illness from the day of admission and additional interim summary   ? ?76 y.o. 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 ? ?                                                               Hospital Course  ? ?  ?Acute GI bleed likely upper GI given that patient was having dark tarry stools in the  ER -   Dr. Michail Sermon on-call gastroenterologist for [facility] GI on board he received total 4 units of packed RBCs last on 08/27/2021, now H&H stable with no signs of active bleeding, he was seen by GI, EGD showed large duodenal ulcer, per GI continue to hold aspirin and Eliquis till he sees GI and cleared by them to do so, PPI twice daily with close outpatient GI and PCP follow-up. ?Acute blood loss anemia hemoglobin around 4 last hemoglobin in our system was around 8.  Plan as #1 above. ?Inadvertently drinking possibly hand soap versus mouthwash.  Patient states he had a lot of pain and he inadvertently drank it.  Denies any suicidal ideation.  Requested patient advised to be careful with oral intake. ?History of CAD status post stenting denies any chest pain. ?History of A-fib presently  patient is tachycardic.  Appears sinus.  Will get EKG closely monitor. Eliquis held as in #1 above.  Family not sure when the last dose was.  Mali vas 2 score is at least greater than 3 will have him follow-up with cardiology for possible Watchman device evaluation in the outpatient setting. 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  Currently undergoing radiation treatments at [facility] long.  Has some neck discomfort due to that which will be managed here with supportive care.  Discussed the case with radiation oncology team on 08/27/2021 they do not think that radiation treatments are contributing to his GI pathology.   ?Diabetes mellitus type 2.  Home regimen. ? ?Discharge diagnosis   ? ? ?Principal Problem: ?  Acute GI bleeding ?Active Problems: ?  CAD S/P percutaneous coronary angioplasty ?  Essential hypertension ?  Type 2 diabetes mellitus ([place]) ?  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  Acute blood loss anemia ? ? ? ?Discharge instructions   ? ?Discharge Instructions   ? ? Discharge instructions   Complete by: As directed ?  ? Follow with Primary MD [name],  MD and your gastroenterologist in 7 days, do not resume your Eliquis until you have been cleared by your gastroenterologist to do so ? ?Get CBC, CMP, 2 view Chest X ray -  checked next visit within 1 week by Primary MD   ? ?Activity: As tolerated with Full fall precautions use walker/cane & assistance as needed ? ?Disposition Home   ? ?Diet: Heart Healthy Soft diet   ?  ? ?Special Instructions: If you have smoked or chewed Tobacco  in the last 2 yrs please stop smoking, stop any regular Alcohol  and or any Recreational drug use. ? ?On your next visit with your primary care physician please Get Medicines reviewed and adjusted. ? ?Please request your Prim.MD to go over all Hospital Tests and Procedure/Radiological results at the follow up, please get all Hospital records sent to your Prim MD by signing hospital release before you go home. ? ?If you experience worsening of your admission symptoms, develop shortness of breath, life threatening emergency, suicidal or homicidal thoughts you must seek medical attention immediately by calling 911 or calling your MD immediately  if symptoms less severe. ? ?You Must read complete instructions/literature along with all the possible adverse reactions/side effects for all the Medicines you take and that have been prescribed to you. Take any new Medicines after you have completely understood and accpet all the possible adverse reactions/side effects.  ? Increase activity slowly   Complete by: As directed ?  ? ?  ? ? ?Discharge Medications  ? ?Allergies as of 08/29/2021   ?No Known Allergies ?  ? ?  ?Medication List  ?  ? ?STOP taking these medications   ? ?aspirin EC 81 MG tablet ?  ?Eliquis 5 MG Tabs tablet ?Generic drug: apixaban ?  ? ?  ? ?TAKE these medications   ? ?Accu-Chek Guide test strip ?Generic drug: glucose blood ?  ?albuterol 108 (90 Base) MCG/ACT inhaler ?Commonly known as: VENTOLIN HFA ?  ?amLODipine 5 MG tablet ?Commonly known as: NORVASC ?TAKE 1 TABLET BY MOUTH   DAILY ?  ?atorvastatin 80 MG tablet ?Commonly known as: LIPITOR ?Take 80 mg by mouth daily. ?  ?budesonide-formoterol 160-4.5 MCG/ACT inhaler ?Commonly known as: SYMBICORT ?Inhale 2 puffs into the lungs 2 (two) times daily. ?  ?CareTouch  2 CPAP Hose [place] Misc ?See admin instructions. ?  ?ezetimibe 10 MG tablet ?Commonly known as: ZETIA ?Take 10 mg by mouth daily. ?  ?finasteride 5 MG tablet ?Commonly known as: PROSCAR ?Take 5 mg by mouth daily. ?  ?folic acid 1 MG tablet ?Commonly known as: FOLVITE ?TAKE 1 TABLET BY MOUTH  DAILY ?  ?gabapentin 300 MG capsule ?Commonly known as: NEURONTIN ?Take 300 mg by mouth 2 (two) times daily. ?  ?latanoprost 0.005 % ophthalmic solution ?Commonly known as: XALATAN ?Place 1 drop into both eyes at bedtime. ?  ?metFORMIN 1000 MG tablet ?Commonly known as: GLUCOPHAGE ?Take 1,000 mg by mouth 2 (two) times daily. ?  ?metoprolol tartrate 25 MG tablet ?Commonly known as: LOPRESSOR ?Take 25 mg 2 (two) times daily by mouth. ?  ?mirtazapine 30 MG tablet ?Commonly known as: REMERON ?TAKE 1 TABLET BY MOUTH AT  BEDTIME ?  ?multivitamin with minerals Tabs tablet ?Take 1 tablet by mouth daily. Centrum Silver ?  ?nitroGLYCERIN 0.4 MG SL tablet ?Commonly known as: Nitrostat ?Place 1 tablet (0.4 mg total) under the tongue every 5 (five) minutes as needed for chest pain. ?  ?pantoprazole 40 MG tablet ?Commonly known as: PROTONIX ?Take 1 tablet (40 mg total) by mouth 2 (two) times daily before a meal. ?What changed: when to take this ?  ?Pharmacist Choice Lancets Misc ?  ?ramipril 10 MG capsule ?Commonly known as: ALTACE ?Take 10 mg by mouth 2 (two) times daily. ?  ?Systane 0.4-0.3 % Soln ?Generic drug: Polyethyl Glycol-Propyl Glycol ?Place 1-2 drops into both eyes 3 (three) times daily as needed (dry/irritated eyes.). ?  ?traMADol 50 MG tablet ?Commonly known as: ULTRAM ?Take 1 tablet (50 mg total) by mouth every 12 (twelve) hours as needed for moderate pain or severe pain. ?  ? ?  ? ?  ?  ? ? ?   ?Durable Medical Equipment  ?(From admission, onward)  ?  ? ? ?  ? ?  Start     Ordered  ? 08/29/21 0743  For home use only DME Walker rolling  Once       ?Comments: 5 wheel  ?Question Answer Comment

## 2021-08-29 NOTE — Discharge Instructions (Signed)
Follow with Primary MD [name], MD and your gastroenterologist in 7 days, do not resume your Eliquis until you have been cleared by your gastroenterologist to do so ? ?Get CBC, CMP, 2 view Chest X ray -  checked next visit within 1 week by Primary MD   ? ?Activity: As tolerated with Full fall precautions use walker/cane & assistance as needed ? ?Disposition Home   ? ?Diet: Heart Healthy Soft diet   ?  ? ?Special Instructions: If you have smoked or chewed Tobacco  in the last 2 yrs please stop smoking, stop any regular Alcohol  and or any Recreational drug use. ? ?On your next visit with your primary care physician please Get Medicines reviewed and adjusted. ? ?Please request your Prim.MD to go over all Hospital Tests and Procedure/Radiological results at the follow up, please get all Hospital records sent to your Prim MD by signing hospital release before you go home. ? ?If you experience worsening of your admission symptoms, develop shortness of breath, life threatening emergency, suicidal or homicidal thoughts you must seek medical attention immediately by calling 911 or calling your MD immediately  if symptoms less severe. ? ?You Must read complete instructions/literature along with all the possible adverse reactions/side effects for all the Medicines you take and that have been prescribed to you. Take any new Medicines after you have completely understood and accpet all the possible adverse reactions/side effects.  ? ?  ?

## 2021-08-30 ENCOUNTER — Ambulatory Visit: Payer: Medicare Other

## 2021-08-30 ENCOUNTER — Ambulatory Visit
Admission: RE | Admit: 2021-08-30 | Discharge: 2021-08-30 | Disposition: A | Discharge status: Home / Self Care | Payer: Medicare Other | Source: Ambulatory Visit | Attending: Radiation Oncology | Admitting: Radiation Oncology

## 2021-08-30 ENCOUNTER — Other Ambulatory Visit: Payer: Self-pay

## 2021-08-30 DIAGNOSIS — G4733 Obstructive sleep apnea (adult) (pediatric): Secondary | ICD-10-CM | POA: Diagnosis not present

## 2021-08-30 DIAGNOSIS — R5381 Other malaise: Secondary | ICD-10-CM | POA: Diagnosis not present

## 2021-08-30 DIAGNOSIS — Y842 Radiological procedure and radiotherapy as the cause of abnormal reaction of the patient, or of later complication, without mention of misadventure at the time of the procedure: Secondary | ICD-10-CM | POA: Diagnosis present

## 2021-08-30 DIAGNOSIS — C760 Malignant neoplasm of head, face and neck: Secondary | ICD-10-CM | POA: Diagnosis not present

## 2021-08-30 DIAGNOSIS — I480 Paroxysmal atrial fibrillation: Secondary | ICD-10-CM | POA: Diagnosis not present

## 2021-08-30 DIAGNOSIS — J90 Pleural effusion, not elsewhere classified: Secondary | ICD-10-CM | POA: Diagnosis not present

## 2021-08-30 DIAGNOSIS — Z833 Family history of diabetes mellitus: Secondary | ICD-10-CM | POA: Diagnosis not present

## 2021-08-30 DIAGNOSIS — C770 Secondary and unspecified malignant neoplasm of lymph nodes of head, face and neck: Secondary | ICD-10-CM | POA: Diagnosis not present

## 2021-08-30 DIAGNOSIS — D62 Acute posthemorrhagic anemia: Secondary | ICD-10-CM | POA: Diagnosis not present

## 2021-08-30 DIAGNOSIS — Z8249 Family history of ischemic heart disease and other diseases of the circulatory system: Secondary | ICD-10-CM | POA: Diagnosis not present

## 2021-08-30 DIAGNOSIS — E1152 Type 2 diabetes mellitus with diabetic peripheral angiopathy with gangrene: Secondary | ICD-10-CM | POA: Diagnosis not present

## 2021-08-30 DIAGNOSIS — D492 Neoplasm of unspecified behavior of bone, soft tissue, and skin: Secondary | ICD-10-CM | POA: Diagnosis not present

## 2021-08-30 DIAGNOSIS — C773 Secondary and unspecified malignant neoplasm of axilla and upper limb lymph nodes: Secondary | ICD-10-CM | POA: Diagnosis not present

## 2021-08-30 DIAGNOSIS — J432 Centrilobular emphysema: Secondary | ICD-10-CM | POA: Diagnosis not present

## 2021-08-30 DIAGNOSIS — M25512 Pain in left shoulder: Secondary | ICD-10-CM | POA: Diagnosis present

## 2021-08-30 DIAGNOSIS — R6889 Other general symptoms and signs: Secondary | ICD-10-CM | POA: Diagnosis not present

## 2021-08-30 DIAGNOSIS — K264 Chronic or unspecified duodenal ulcer with hemorrhage: Secondary | ICD-10-CM | POA: Diagnosis not present

## 2021-08-30 DIAGNOSIS — Z510 Encounter for antineoplastic radiation therapy: Secondary | ICD-10-CM | POA: Diagnosis not present

## 2021-08-30 DIAGNOSIS — J341 Cyst and mucocele of nose and nasal sinus: Secondary | ICD-10-CM | POA: Diagnosis not present

## 2021-08-30 DIAGNOSIS — I2510 Atherosclerotic heart disease of native coronary artery without angina pectoris: Secondary | ICD-10-CM | POA: Diagnosis not present

## 2021-08-30 DIAGNOSIS — L89151 Pressure ulcer of sacral region, stage 1: Secondary | ICD-10-CM | POA: Diagnosis not present

## 2021-08-30 DIAGNOSIS — C7989 Secondary malignant neoplasm of other specified sites: Secondary | ICD-10-CM | POA: Diagnosis not present

## 2021-08-30 DIAGNOSIS — Z9861 Coronary angioplasty status: Secondary | ICD-10-CM | POA: Diagnosis not present

## 2021-08-30 DIAGNOSIS — Z743 Need for continuous supervision: Secondary | ICD-10-CM | POA: Diagnosis not present

## 2021-08-30 DIAGNOSIS — R590 Localized enlarged lymph nodes: Secondary | ICD-10-CM | POA: Diagnosis not present

## 2021-08-30 DIAGNOSIS — C3412 Malignant neoplasm of upper lobe, left bronchus or lung: Secondary | ICD-10-CM | POA: Diagnosis not present

## 2021-08-30 DIAGNOSIS — Z9989 Dependence on other enabling machines and devices: Secondary | ICD-10-CM | POA: Diagnosis not present

## 2021-08-30 DIAGNOSIS — I10 Essential (primary) hypertension: Secondary | ICD-10-CM | POA: Diagnosis not present

## 2021-08-30 DIAGNOSIS — K922 Gastrointestinal hemorrhage, unspecified: Secondary | ICD-10-CM | POA: Diagnosis not present

## 2021-08-30 DIAGNOSIS — I959 Hypotension, unspecified: Secondary | ICD-10-CM | POA: Diagnosis not present

## 2021-08-30 DIAGNOSIS — E785 Hyperlipidemia, unspecified: Secondary | ICD-10-CM | POA: Diagnosis not present

## 2021-08-30 DIAGNOSIS — R578 Other shock: Secondary | ICD-10-CM | POA: Diagnosis not present

## 2021-08-30 DIAGNOSIS — Z87891 Personal history of nicotine dependence: Secondary | ICD-10-CM | POA: Diagnosis not present

## 2021-08-30 DIAGNOSIS — C3490 Malignant neoplasm of unspecified part of unspecified bronchus or lung: Secondary | ICD-10-CM | POA: Diagnosis not present

## 2021-08-30 DIAGNOSIS — G893 Neoplasm related pain (acute) (chronic): Secondary | ICD-10-CM | POA: Diagnosis not present

## 2021-08-30 DIAGNOSIS — Z83438 Family history of other disorder of lipoprotein metabolism and other lipidemia: Secondary | ICD-10-CM | POA: Diagnosis not present

## 2021-08-30 DIAGNOSIS — R0902 Hypoxemia: Secondary | ICD-10-CM | POA: Diagnosis not present

## 2021-08-30 DIAGNOSIS — J438 Other emphysema: Secondary | ICD-10-CM | POA: Diagnosis not present

## 2021-08-30 DIAGNOSIS — L8992 Pressure ulcer of unspecified site, stage 2: Secondary | ICD-10-CM | POA: Diagnosis not present

## 2021-08-30 DIAGNOSIS — E1159 Type 2 diabetes mellitus with other circulatory complications: Secondary | ICD-10-CM | POA: Diagnosis not present

## 2021-08-30 DIAGNOSIS — C7951 Secondary malignant neoplasm of bone: Secondary | ICD-10-CM | POA: Diagnosis not present

## 2021-08-30 DIAGNOSIS — N179 Acute kidney failure, unspecified: Secondary | ICD-10-CM | POA: Diagnosis not present

## 2021-09-01 ENCOUNTER — Other Ambulatory Visit: Payer: Self-pay

## 2021-09-01 ENCOUNTER — Encounter (HOSPITAL_COMMUNITY): Payer: Self-pay

## 2021-09-01 ENCOUNTER — Emergency Department (HOSPITAL_COMMUNITY): Payer: Medicare Other

## 2021-09-01 ENCOUNTER — Inpatient Hospital Stay (HOSPITAL_COMMUNITY)
Admission: EM | Admit: 2021-09-01 | Discharge: 2021-09-04 | DRG: 377 | Disposition: A | Discharge status: Home Health | Payer: Medicare Other | Attending: Internal Medicine | Admitting: Internal Medicine

## 2021-09-01 DIAGNOSIS — C7951 Secondary malignant neoplasm of bone: Secondary | ICD-10-CM | POA: Diagnosis present

## 2021-09-01 DIAGNOSIS — E119 Type 2 diabetes mellitus without complications: Secondary | ICD-10-CM

## 2021-09-01 DIAGNOSIS — Z7901 Long term (current) use of anticoagulants: Secondary | ICD-10-CM

## 2021-09-01 DIAGNOSIS — K264 Chronic or unspecified duodenal ulcer with hemorrhage: Principal | ICD-10-CM | POA: Diagnosis present

## 2021-09-01 DIAGNOSIS — Y842 Radiological procedure and radiotherapy as the cause of abnormal reaction of the patient, or of later complication, without mention of misadventure at the time of the procedure: Secondary | ICD-10-CM | POA: Diagnosis present

## 2021-09-01 DIAGNOSIS — E785 Hyperlipidemia, unspecified: Secondary | ICD-10-CM | POA: Diagnosis present

## 2021-09-01 DIAGNOSIS — R5381 Other malaise: Secondary | ICD-10-CM | POA: Diagnosis not present

## 2021-09-01 DIAGNOSIS — C3490 Malignant neoplasm of unspecified part of unspecified bronchus or lung: Secondary | ICD-10-CM | POA: Diagnosis present

## 2021-09-01 DIAGNOSIS — M25512 Pain in left shoulder: Secondary | ICD-10-CM | POA: Diagnosis present

## 2021-09-01 DIAGNOSIS — I480 Paroxysmal atrial fibrillation: Secondary | ICD-10-CM | POA: Diagnosis not present

## 2021-09-01 DIAGNOSIS — Z9989 Dependence on other enabling machines and devices: Secondary | ICD-10-CM | POA: Diagnosis not present

## 2021-09-01 DIAGNOSIS — I2510 Atherosclerotic heart disease of native coronary artery without angina pectoris: Secondary | ICD-10-CM | POA: Diagnosis not present

## 2021-09-01 DIAGNOSIS — J438 Other emphysema: Secondary | ICD-10-CM | POA: Diagnosis present

## 2021-09-01 DIAGNOSIS — C7989 Secondary malignant neoplasm of other specified sites: Secondary | ICD-10-CM | POA: Diagnosis present

## 2021-09-01 DIAGNOSIS — K922 Gastrointestinal hemorrhage, unspecified: Secondary | ICD-10-CM | POA: Diagnosis not present

## 2021-09-01 DIAGNOSIS — Z83438 Family history of other disorder of lipoprotein metabolism and other lipidemia: Secondary | ICD-10-CM

## 2021-09-01 DIAGNOSIS — Z79899 Other long term (current) drug therapy: Secondary | ICD-10-CM

## 2021-09-01 DIAGNOSIS — Z833 Family history of diabetes mellitus: Secondary | ICD-10-CM

## 2021-09-01 DIAGNOSIS — I10 Essential (primary) hypertension: Secondary | ICD-10-CM | POA: Diagnosis not present

## 2021-09-01 DIAGNOSIS — C3412 Malignant neoplasm of upper lobe, left bronchus or lung: Secondary | ICD-10-CM | POA: Diagnosis not present

## 2021-09-01 DIAGNOSIS — G4733 Obstructive sleep apnea (adult) (pediatric): Secondary | ICD-10-CM

## 2021-09-01 DIAGNOSIS — E1152 Type 2 diabetes mellitus with diabetic peripheral angiopathy with gangrene: Secondary | ICD-10-CM | POA: Diagnosis present

## 2021-09-01 DIAGNOSIS — R578 Other shock: Secondary | ICD-10-CM | POA: Diagnosis present

## 2021-09-01 DIAGNOSIS — D62 Acute posthemorrhagic anemia: Secondary | ICD-10-CM | POA: Diagnosis present

## 2021-09-01 DIAGNOSIS — K219 Gastro-esophageal reflux disease without esophagitis: Secondary | ICD-10-CM | POA: Diagnosis present

## 2021-09-01 DIAGNOSIS — G893 Neoplasm related pain (acute) (chronic): Secondary | ICD-10-CM | POA: Diagnosis present

## 2021-09-01 DIAGNOSIS — J90 Pleural effusion, not elsewhere classified: Secondary | ICD-10-CM | POA: Diagnosis present

## 2021-09-01 DIAGNOSIS — Z8249 Family history of ischemic heart disease and other diseases of the circulatory system: Secondary | ICD-10-CM | POA: Diagnosis not present

## 2021-09-01 DIAGNOSIS — N400 Enlarged prostate without lower urinary tract symptoms: Secondary | ICD-10-CM | POA: Diagnosis present

## 2021-09-01 DIAGNOSIS — C770 Secondary and unspecified malignant neoplasm of lymph nodes of head, face and neck: Secondary | ICD-10-CM | POA: Diagnosis present

## 2021-09-01 DIAGNOSIS — C773 Secondary and unspecified malignant neoplasm of axilla and upper limb lymph nodes: Secondary | ICD-10-CM | POA: Diagnosis present

## 2021-09-01 DIAGNOSIS — L89151 Pressure ulcer of sacral region, stage 1: Secondary | ICD-10-CM | POA: Diagnosis present

## 2021-09-01 DIAGNOSIS — Z955 Presence of coronary angioplasty implant and graft: Secondary | ICD-10-CM

## 2021-09-01 DIAGNOSIS — N179 Acute kidney failure, unspecified: Secondary | ICD-10-CM | POA: Diagnosis present

## 2021-09-01 DIAGNOSIS — Z7984 Long term (current) use of oral hypoglycemic drugs: Secondary | ICD-10-CM

## 2021-09-01 DIAGNOSIS — J432 Centrilobular emphysema: Secondary | ICD-10-CM | POA: Diagnosis present

## 2021-09-01 DIAGNOSIS — E1159 Type 2 diabetes mellitus with other circulatory complications: Secondary | ICD-10-CM | POA: Diagnosis not present

## 2021-09-01 DIAGNOSIS — Z9861 Coronary angioplasty status: Secondary | ICD-10-CM | POA: Diagnosis not present

## 2021-09-01 DIAGNOSIS — L8992 Pressure ulcer of unspecified site, stage 2: Secondary | ICD-10-CM | POA: Diagnosis not present

## 2021-09-01 DIAGNOSIS — Z87891 Personal history of nicotine dependence: Secondary | ICD-10-CM

## 2021-09-01 DIAGNOSIS — H409 Unspecified glaucoma: Secondary | ICD-10-CM | POA: Diagnosis present

## 2021-09-01 DIAGNOSIS — L8990 Pressure ulcer of unspecified site, unspecified stage: Secondary | ICD-10-CM | POA: Insufficient documentation

## 2021-09-01 DIAGNOSIS — Z7951 Long term (current) use of inhaled steroids: Secondary | ICD-10-CM

## 2021-09-01 LAB — CBC WITH DIFFERENTIAL/PLATELET
Abs Immature Granulocytes: 0.09 10*3/uL — ABNORMAL HIGH (ref 0.00–0.07)
Basophils Absolute: 0 10*3/uL (ref 0.0–0.1)
Basophils Relative: 0 %
Eosinophils Absolute: 0.1 10*3/uL (ref 0.0–0.5)
Eosinophils Relative: 1 %
HCT: 22.6 % — ABNORMAL LOW (ref 39.0–52.0)
Hemoglobin: 7.1 g/dL — ABNORMAL LOW (ref 13.0–17.0)
Immature Granulocytes: 1 %
Lymphocytes Relative: 4 %
Lymphs Abs: 0.4 10*3/uL — ABNORMAL LOW (ref 0.7–4.0)
MCH: 29.1 pg (ref 26.0–34.0)
MCHC: 31.4 g/dL (ref 30.0–36.0)
MCV: 92.6 fL (ref 80.0–100.0)
Monocytes Absolute: 0.9 10*3/uL (ref 0.1–1.0)
Monocytes Relative: 9 %
Neutro Abs: 9.4 10*3/uL — ABNORMAL HIGH (ref 1.7–7.7)
Neutrophils Relative %: 85 %
Platelets: 554 10*3/uL — ABNORMAL HIGH (ref 150–400)
RBC: 2.44 MIL/uL — ABNORMAL LOW (ref 4.22–5.81)
RDW: 17.5 % — ABNORMAL HIGH (ref 11.5–15.5)
WBC: 11 10*3/uL — ABNORMAL HIGH (ref 4.0–10.5)
nRBC: 0 % (ref 0.0–0.2)

## 2021-09-01 LAB — COMPREHENSIVE METABOLIC PANEL
ALT: 17 U/L (ref 0–44)
AST: 17 U/L (ref 15–41)
Albumin: 1.7 g/dL — ABNORMAL LOW (ref 3.5–5.0)
Alkaline Phosphatase: 95 U/L (ref 38–126)
Anion gap: 7 (ref 5–15)
BUN: 48 mg/dL — ABNORMAL HIGH (ref 8–23)
CO2: 18 mmol/L — ABNORMAL LOW (ref 22–32)
Calcium: 8.2 mg/dL — ABNORMAL LOW (ref 8.9–10.3)
Chloride: 111 mmol/L (ref 98–111)
Creatinine, Ser: 1.8 mg/dL — ABNORMAL HIGH (ref 0.61–1.24)
GFR, Estimated: 39 mL/min — ABNORMAL LOW (ref >60)
Glucose, Bld: 115 mg/dL — ABNORMAL HIGH (ref 70–99)
Potassium: 4.7 mmol/L (ref 3.5–5.1)
Sodium: 136 mmol/L (ref 135–145)
Total Bilirubin: 0.4 mg/dL (ref 0.3–1.2)
Total Protein: 4.8 g/dL — ABNORMAL LOW (ref 6.5–8.1)

## 2021-09-01 LAB — PREPARE RBC (CROSSMATCH)

## 2021-09-01 LAB — HEMOGLOBIN: Hemoglobin: 9.2 g/dL — ABNORMAL LOW (ref 13.0–17.0)

## 2021-09-01 LAB — LACTIC ACID, PLASMA: Lactic Acid, Venous: 1.5 mmol/L (ref 0.5–1.9)

## 2021-09-01 MED ORDER — ACETAMINOPHEN 325 MG PO TABS: 650.0000 mg | ORAL_TABLET | Freq: Four times a day (QID) | ORAL | Status: DC | PRN

## 2021-09-01 MED ORDER — ORAL CARE MOUTH RINSE
15.0000 mL | Freq: Two times a day (BID) | OROMUCOSAL | Status: DC
  Administered 2021-09-02 – 2021-09-04 (×6): 15 mL via OROMUCOSAL

## 2021-09-01 MED ORDER — PANTOPRAZOLE 80MG IVPB - SIMPLE MED
80.0000 mg | Freq: Once | INTRAVENOUS | Status: DC
Start: 2021-09-01 — End: 2021-09-01

## 2021-09-01 MED ORDER — ONDANSETRON HCL 4 MG PO TABS: 4.0000 mg | ORAL_TABLET | Freq: Four times a day (QID) | ORAL | Status: DC | PRN

## 2021-09-01 MED ORDER — PANTOPRAZOLE SODIUM 40 MG IV SOLR
40.0000 mg | Freq: Two times a day (BID) | INTRAVENOUS | Status: DC
Start: 2021-09-05 — End: 2021-09-01

## 2021-09-01 MED ORDER — LACTATED RINGERS IV BOLUS
1000.0000 mL | Freq: Once | INTRAVENOUS | Status: AC
Start: 2021-09-01 — End: 2021-09-01
  Administered 2021-09-01: 1000 mL via INTRAVENOUS

## 2021-09-01 MED ORDER — ALBUTEROL SULFATE (2.5 MG/3ML) 0.083% IN NEBU
2.5000 mg | INHALATION_SOLUTION | RESPIRATORY_TRACT | Status: DC | PRN
Start: 2021-09-01 — End: 2021-09-04

## 2021-09-01 MED ORDER — CHLORHEXIDINE GLUCONATE CLOTH 2 % EX PADS
6.0000 | MEDICATED_PAD | Freq: Every day | CUTANEOUS | Status: DC
  Administered 2021-09-03 (×2): 6 via TOPICAL

## 2021-09-01 MED ORDER — PANTOPRAZOLE INFUSION (NEW) - SIMPLE MED
8.0000 mg/h | INTRAVENOUS | Status: DC
  Administered 2021-09-01 – 2021-09-03 (×4): 8 mg/h via INTRAVENOUS
  Filled 2021-09-01: qty 80
  Filled 2021-09-01: qty 100
  Filled 2021-09-01 (×2): qty 80

## 2021-09-01 MED ORDER — SODIUM CHLORIDE 0.9% IV SOLUTION: Freq: Once | INTRAVENOUS | Status: AC

## 2021-09-01 MED ORDER — LACTATED RINGERS IV SOLN: INTRAVENOUS | Status: DC

## 2021-09-01 MED ORDER — ONDANSETRON HCL 4 MG/2ML IJ SOLN: 4.0000 mg | Freq: Four times a day (QID) | INTRAMUSCULAR | Status: DC | PRN

## 2021-09-01 MED ORDER — ACETAMINOPHEN 650 MG RE SUPP: 650.0000 mg | Freq: Four times a day (QID) | RECTAL | Status: DC | PRN

## 2021-09-01 NOTE — ED Provider Notes (Signed)
?[facility] DEPT ?Provider Note ? ? ?CSN: [id number] ?Arrival date & time: 09/01/21  1810 ? ?  ? ?History ? ?Chief Complaint  ?Patient presents with  ? Hypotension  ? Shoulder Pain  ? ? ?Trevor Bailey is a 76 y.o. male. ? ?76 year old male presents from home due to left shoulder pain.  Patient has history of lung cancer and is currently receiving chemotherapy as well as radiation treatment.  Denies any trauma to his shoulder.  No fever or chills.  Denies any trauma.  Patient has had some dark stools.  Denies any abdominal discomfort.  States he has had increasing dyspnea on exertion.  Called EMS and patient was found to be hypotensive and patient transported here after receiving IV fluids  ? ? ?  ? ?Home Medications ?Prior to Admission medications   ?Medication Sig Start Date End Date Taking? Authorizing Provider  ?ACCU-CHEK GUIDE test strip  11/09/19   [provider]  ?albuterol (VENTOLIN HFA) 108 (90 Base) MCG/ACT inhaler  05/10/20   [provider]  ?amLODipine (NORVASC) 5 MG tablet TAKE 1 TABLET BY MOUTH  DAILY 01/29/21   Troy Sine, MD  ?atorvastatin (LIPITOR) 80 MG tablet Take 80 mg by mouth daily.    [provider]  ?budesonide-formoterol (SYMBICORT) 160-4.5 MCG/ACT inhaler Inhale 2 puffs into the lungs 2 (two) times daily.    [name], MD  ?ezetimibe (ZETIA) 10 MG tablet Take 10 mg by mouth daily.    [provider]  ?finasteride (PROSCAR) 5 MG tablet Take 5 mg by mouth daily.  02/18/16   [provider]  ?folic acid (FOLVITE) 1 MG tablet TAKE 1 TABLET BY MOUTH  DAILY 10/24/20   Curt Bears, MD  ?gabapentin (NEURONTIN) 300 MG capsule Take 300 mg by mouth 2 (two) times daily.    [name], MD  ?latanoprost (XALATAN) 0.005 % ophthalmic solution Place 1 drop into both eyes at bedtime.  08/09/18   [provider]  ?metFORMIN (GLUCOPHAGE) 1000 MG tablet Take 1,000 mg by mouth 2 (two) times daily.  05/25/13    [provider]  ?metoprolol tartrate (LOPRESSOR) 25 MG tablet Take 25 mg 2 (two) times daily by mouth.    [provider]  ?mirtazapine (REMERON) 30 MG tablet TAKE 1 TABLET BY MOUTH AT  BEDTIME 09/19/20   Curt Bears, MD  ?Multiple Vitamin (MULTIVITAMIN WITH MINERALS) TABS tablet Take 1 tablet by mouth daily. Centrum Silver    [provider]  ?nitroGLYCERIN (NITROSTAT) 0.4 MG SL tablet Place 1 tablet (0.4 mg total) under the tongue every 5 (five) minutes as needed for chest pain. 05/04/18   Troy Sine, MD  ?pantoprazole (PROTONIX) 40 MG tablet Take 1 tablet (40 mg total) by mouth 2 (two) times daily before a meal. 08/29/21   Thurnell Lose, MD  ?Pharmacist Choice Lancets [place]  07/30/20   [provider]  ?Polyethyl Glycol-Propyl Glycol (SYSTANE) 0.4-0.3 % SOLN Place 1-2 drops into both eyes 3 (three) times daily as needed (dry/irritated eyes.).    [provider]  ?ramipril (ALTACE) 10 MG capsule Take 10 mg by mouth 2 (two) times daily. 05/25/13   [provider]  ?Respiratory Therapy Supplies (CARETOUCH 2 CPAP HOSE HANGER) MISC See admin instructions. 07/29/18   [provider]  ?traMADol (ULTRAM) 50 MG tablet Take 1 tablet (50 mg total) by mouth every 12 (twelve) hours as needed for moderate pain or severe pain. 08/29/21   Lala Lund  K, MD  ?   ? ?Allergies    ?Patient has no known allergies.   ? ?Review of Systems   ?Review of Systems  ?All other systems reviewed and are negative. ? ?Physical Exam ?Updated Vital Signs ?BP (!) 65/44 (BP Location: Right Arm)   Pulse 95   Temp (!) 97.4 ?F (36.3 ?C) (Oral)   Resp 17   Ht 1.803 m (5\' 11" )   Wt 72.6 kg   SpO2 100%   BMI 22.32 kg/m?  ?Physical Exam ?Vitals and nursing note reviewed.  ?Constitutional:   ?   General: He is not in acute distress. ?   Appearance: Normal appearance. He is well-developed. He is not toxic-appearing.  ?HENT:  ?   Head: Normocephalic and atraumatic.  ?Eyes:  ?    General: Lids are normal.  ?   Pupils: Pupils are equal, round, and reactive to light.  ?   Comments: Pale conjunctive   ?Neck:  ?   Thyroid: No thyroid mass.  ?   Trachea: No tracheal deviation.  ?Cardiovascular:  ?   Rate and Rhythm: Normal rate and regular rhythm.  ?   Heart sounds: Normal heart sounds. No murmur heard. ?  No gallop.  ?Pulmonary:  ?   Effort: Pulmonary effort is normal. No respiratory distress.  ?   Breath sounds: Normal breath sounds. No stridor. No decreased breath sounds, wheezing, rhonchi or rales.  ?Abdominal:  ?   General: There is no distension.  ?   Palpations: Abdomen is soft.  ?   Tenderness: There is no abdominal tenderness. There is no rebound.  ?Musculoskeletal:     ?   General: No tenderness.  ?   Left shoulder: Decreased range of motion.  ?     Arms: ? ?   Cervical back: Normal range of motion and neck supple.  ?Skin: ?   General: Skin is warm and dry.  ?   Findings: No abrasion or rash.  ?Neurological:  ?   Mental Status: He is alert and oriented to person, place, and time. Mental status is at baseline.  ?   GCS: GCS eye subscore is 4. GCS verbal subscore is 5. GCS motor subscore is 6.  ?   Cranial Nerves: No cranial nerve deficit.  ?   Sensory: No sensory deficit.  ?   Motor: Motor function is intact.  ?Psychiatric:     ?   Attention and Perception: Attention normal.     ?   Speech: Speech normal.     ?   Behavior: Behavior normal.  ? ? ?ED Results / Procedures / Treatments   ?Labs ?(all labs ordered are listed, but only abnormal results are displayed) ?Labs Reviewed  ?CBC WITH DIFFERENTIAL/PLATELET  ?COMPREHENSIVE METABOLIC PANEL  ?LACTIC ACID, PLASMA  ?LACTIC ACID, PLASMA  ?TYPE AND SCREEN  ? ? ?EKG ?None ? ?Radiology ?No results found. ? ?Procedures ?Procedures  ? ? ?Medications Ordered in ED ?Medications  ?lactated ringers bolus 1,000 mL (has no administration in time range)  ?lactated ringers infusion (has no administration in time range)  ? ? ?ED Course/ Medical Decision  Making/ A&P ?  ?                        ?Medical Decision Making ?Amount and/or Complexity of Data Reviewed ?Labs: ordered. ?Radiology: ordered. ? ?Risk ?Prescription drug management. ? ? ?Patient presented with hypotension and concern for GI bleed.  Patient had grossly  bloody stool here in the department.  Review of his old record shows that he has a history of an ulcer in his upper GI tract.  Patient given IV fluid resuscitation along with 2 units of emergency release blood.  Good response to this and blood pressure is not above 100.  Hemoglobin was 7.1.  Patient has evidence of acute kidney injury with creatinine up to 1.8.  Discussed with GI on-call, Dr. Therisa Doyne, who states that the patient's GI lesions are only amenable to IR treatment with embolization.  Recommends hospitalist admission with IR consult.  Will consult hospitalist for admission ? ?CRITICAL CARE ?Performed by: Leota Jacobsen ?Total critical care time: 75 minutes ?Critical care time was exclusive of separately billable procedures and treating other patients. ?Critical care was necessary to treat or prevent imminent or life-threatening deterioration. ?Critical care was time spent personally by me on the following activities: development of treatment plan with patient and/or surrogate as well as nursing, discussions with consultants, evaluation of patient's response to treatment, examination of patient, obtaining history from patient or surrogate, ordering and performing treatments and interventions, ordering and review of laboratory studies, ordering and review of radiographic studies, pulse oximetry and re-evaluation of patient's condition. ? ?  ? ? ? ? ?Final Clinical Impression(s) / ED Diagnoses ?Final diagnoses:  ?None  ? ? ?Rx / DC Orders ?ED Discharge Orders   ? ? None  ? ?  ? ? ?  ?Lacretia Leigh, MD ?09/01/21 2015 ? ?

## 2021-09-01 NOTE — H&P (Signed)
?History and Physical  ? ? ?Trevor Bailey YOV:[id number] DOB: 05/19/1946 DOA: 09/01/2021 ? ?PCP: [name], MD  ?Patient coming from: home ? ?I have personally briefly reviewed patient's old medical records in [place] ? ?Chief Complaint:home ? ?HPI: Trevor Bailey is a 76 y.o. male with medical history significant of CAD status post stenting, diabetes mellitus type 2, sleep apnea, atrial fibrillation on Eliquis, stage IV non-small cell lung cancer on chemoradiation. Patient has recent interim history of admission for accidental ingestion with subsequent bloody stools for which he was hospitalized (08/24/21-08/28/21).  He was treated with 4 units prbc stablized .Workup at that time noted large duodenal ulcer. His  and eliquis was discontinued and he was discharged on ppi bid.Patient now returns to ED BIB [facility] with complaint of left shoulder pain with hypotension 60/40 in the field. ON further questioning patient relieved that he has had some recurrent dark stools beginning a few days after his last discharge. He denies any anticoagulation,or nsaid use.Of note his h/h on discharged was  9.1 and now on admit 7.2. He notes no presyncope, chest pain , n/v/sob/ palpitations. Secondarily patient has complaint of  left sided shoulder pain at area of radiation changes. He denies any f/c and noted he has had drainage form this are are for some time but noted pain today. ? ? ?ED Course:  ?Afeb, bp 65/44 repeat 86/53,sat 95% Hr 97%  ?Labs: ?Wbc ?11, hgb 7.1, plt 554, pmn 9.4  ?NA 136, K 4.7, co2, 18, cr 1.8up from 0.9 ?Cxr:  ?1. Unchanged post treatment changes in the medial left lung apex and ?small left pleural effusion. ? ?Transfusion of 1 units ordered in Ed ?Review of Systems: As per HPI otherwise 10 point review of systems negative.  ? ?Past Medical History:  ?Diagnosis Date  ? Adenopathy   ? left supraclavicular lymph node  ? Allergies   ? COPD (chronic obstructive pulmonary disease) ([place])   ? Coronary disease    ? Diabetes ([place])   ? Enlarged prostate   ? GERD (gastroesophageal reflux disease)   ? Glaucoma   ? Hyperlipidemia   ? Hypertension   ? Myocardial infarction [facility])   ? nscl ca dx'd 12/2018  ? Sleep apnea   ? wears CPAP  ? Wears partial dentures   ? upper and lower  ? ? ?Past Surgical History:  ?Procedure Laterality Date  ? ABDOMINAL AORTAGRAM Left 09/16/2013  ? Procedure: ABDOMINAL AORTAGRAM;  Surgeon: Elam Dutch, MD;  Location: [facility] CATH LAB;  Service: Cardiovascular;  Laterality: Left;  ? CARDIAC CATHETERIZATION  01/2011  ? When there was segmental stenosis of the distal RCA, patent PCA stent, patent circumflex stent, and a patent small diagonal with 90% ISR.   ? COLONOSCOPY W/ BIOPSIES AND POLYPECTOMY    ? CORONARY ANGIOPLASTY  may 2002  ? Non-DES stenting of his circumflex, non-DES stenting of the RCA  ? ESOPHAGOGASTRODUODENOSCOPY (EGD) WITH PROPOFOL N/A 08/26/2021  ? Procedure: ESOPHAGOGASTRODUODENOSCOPY (EGD) WITH PROPOFOL;  Surgeon: Arta Silence, MD;  Location: [facility];  Service: Gastroenterology;  Laterality: N/A;  ? HYDROCELE EXCISION / REPAIR  2009  ? by Dr Janice Norrie  ? INGUINAL HERNIA REPAIR Bilateral   ? Dr Bubba Camp  ? LYMPH NODE BIOPSY Left 01/10/2019  ? Procedure: left supraclavicular LYMPH NODE BIOPSY;  Surgeon: [name], MD;  Location: [facility];  Service: Thoracic;  Laterality: Left;  ? MULTIPLE TOOTH EXTRACTIONS    ? NM MYOCAR PERF WALL MOTION  01/08/2011  ? moderate in size and intensity area of reversible ischemia in the basal to mid inferior and septal territories. Abnormal study  ? stents  2008  ? proximal RCA, DES for progession of disease.  ? US ECHOCARDIOGRAPHY  01/12/2012  ? mild concentric LVH, borderline LA enlargement, mild to mod TR  ? ? ? reports that he has quit smoking. His smoking use included cigarettes. He has a 5.00 pack-year smoking history. He has never used smokeless tobacco. He reports current alcohol use. He reports that he does not use drugs. ? ?No Known  Allergies ? ?Family History  ?Problem Relation Age of Onset  ? Heart attack Mother   ? Heart disease Mother   ? Diabetes Father   ? Stroke Father   ? Hypertension Father   ? Hyperlipidemia Sister   ? Hypertension Sister   ? Breast cancer Sister   ? ? ?Prior to Admission medications   ?Medication Sig Start Date End Date Taking? Authorizing Provider  ?ACCU-CHEK GUIDE test strip  11/09/19   [provider]  ?albuterol (VENTOLIN HFA) 108 (90 Base) MCG/ACT inhaler  05/10/20   [provider]  ?amLODipine (NORVASC) 5 MG tablet TAKE 1 TABLET BY MOUTH  DAILY 01/29/21   Troy Sine, MD  ?atorvastatin (LIPITOR) 80 MG tablet Take 80 mg by mouth daily.    [provider]  ?budesonide-formoterol (SYMBICORT) 160-4.5 MCG/ACT inhaler Inhale 2 puffs into the lungs 2 (two) times daily.    [name], MD  ?ezetimibe (ZETIA) 10 MG tablet Take 10 mg by mouth daily.    [provider]  ?finasteride (PROSCAR) 5 MG tablet Take 5 mg by mouth daily.  02/18/16   [provider]  ?folic acid (FOLVITE) 1 MG tablet TAKE 1 TABLET BY MOUTH  DAILY 10/24/20   Curt Bears, MD  ?gabapentin (NEURONTIN) 300 MG capsule Take 300 mg by mouth 2 (two) times daily.    [name], MD  ?latanoprost (XALATAN) 0.005 % ophthalmic solution Place 1 drop into both eyes at bedtime.  08/09/18   [provider]  ?metFORMIN (GLUCOPHAGE) 1000 MG tablet Take 1,000 mg by mouth 2 (two) times daily.  05/25/13   [provider]  ?metoprolol tartrate (LOPRESSOR) 25 MG tablet Take 25 mg 2 (two) times daily by mouth.    [provider]  ?mirtazapine (REMERON) 30 MG tablet TAKE 1 TABLET BY MOUTH AT  BEDTIME 09/19/20   Curt Bears, MD  ?Multiple Vitamin (MULTIVITAMIN WITH MINERALS) TABS tablet Take 1 tablet by mouth daily. Centrum Silver    [provider]  ?nitroGLYCERIN (NITROSTAT) 0.4 MG SL tablet Place 1 tablet (0.4 mg total) under the tongue every 5 (five) minutes as needed for chest  pain. 05/04/18   Troy Sine, MD  ?pantoprazole (PROTONIX) 40 MG tablet Take 1 tablet (40 mg total) by mouth 2 (two) times daily before a meal. 08/29/21   Thurnell Lose, MD  ?Pharmacist Choice Lancets [place]  07/30/20   [provider]  ?Polyethyl Glycol-Propyl Glycol (SYSTANE) 0.4-0.3 % SOLN Place 1-2 drops into both eyes 3 (three) times daily as needed (dry/irritated eyes.).    [provider]  ?ramipril (ALTACE) 10 MG capsule Take 10 mg by mouth 2 (two) times daily. 05/25/13   [provider]  ?Respiratory Therapy Supplies (CARETOUCH 2 CPAP HOSE HANGER) MISC See admin instructions. 07/29/18   [provider]  ?traMADol (ULTRAM) 50 MG tablet Take 1 tablet (50 mg total) by mouth  every 12 (twelve) hours as needed for moderate pain or severe pain. 08/29/21   Thurnell Lose, MD  ? ? ?Physical Exam: ?Vitals:  ? 09/01/21 1942 09/01/21 2000 09/01/21 2015 09/01/21 2030  ?BP: (!) 89/61 (!) 90/57 (!) 91/57 (!) 89/56  ?Pulse: 93 92 94 92  ?Resp: 15 17 17 16   ?Temp:      ?TempSrc:      ?SpO2: 99% 100% 100% 100%  ?Weight:      ?Height:      ? ? ? ?Vitals:  ? 09/01/21 1942 09/01/21 2000 09/01/21 2015 09/01/21 2030  ?BP: (!) 89/61 (!) 90/57 (!) 91/57 (!) 89/56  ?Pulse: 93 92 94 92  ?Resp: 15 17 17 16   ?Temp:      ?TempSrc:      ?SpO2: 99% 100% 100% 100%  ?Weight:      ?Height:      ?Constitutional: NAD, calm, comfortable ?Eyes: PERRL, lids and conjunctivae normal ?ENMT: Mucous membranes are moist. Posterior pharynx clear of any exudate or lesions.Normal dentition.  ?Neck: noted area of radiation changes o left  lateral and posterior neck with area of induration /skin necrosis. Induration extending to superior area of shoulder ?Respiratory: clear to auscultation bilaterally, no wheezing, no crackles. Normal respiratory effort. No accessory muscle use.  ?Cardiovascular: Regular rate and rhythm, no murmurs / rubs / gallops. No extremity edema. 2+ pedal pulses. No carotid bruits.  ?Abdomen: no  tenderness, no masses palpated. No hepatosplenomegaly. Bowel sounds positive.  ?Musculoskeletal: no clubbing / cyanosis. No joint deformity upper and lower extremities. Good ROM, no contractures. Normal muscle tone

## 2021-09-01 NOTE — ED Triage Notes (Signed)
BIB [facility] for hypotension 60s/40s. EMS called for L shoulder pain. Pt being treated for lung CA.  ?

## 2021-09-02 ENCOUNTER — Ambulatory Visit
Admission: RE | Admit: 2021-09-02 | Discharge: 2021-09-02 | Disposition: A | Discharge status: Home / Self Care | Payer: Medicare Other | Source: Ambulatory Visit | Attending: Radiation Oncology | Admitting: Radiation Oncology

## 2021-09-02 ENCOUNTER — Inpatient Hospital Stay (HOSPITAL_COMMUNITY): Payer: Medicare Other

## 2021-09-02 ENCOUNTER — Encounter: Payer: Self-pay | Admitting: Radiation Oncology

## 2021-09-02 DIAGNOSIS — Z87891 Personal history of nicotine dependence: Secondary | ICD-10-CM | POA: Diagnosis not present

## 2021-09-02 DIAGNOSIS — R578 Other shock: Secondary | ICD-10-CM

## 2021-09-02 DIAGNOSIS — I480 Paroxysmal atrial fibrillation: Secondary | ICD-10-CM

## 2021-09-02 DIAGNOSIS — Z510 Encounter for antineoplastic radiation therapy: Secondary | ICD-10-CM | POA: Diagnosis not present

## 2021-09-02 DIAGNOSIS — C3412 Malignant neoplasm of upper lobe, left bronchus or lung: Secondary | ICD-10-CM

## 2021-09-02 DIAGNOSIS — Z9989 Dependence on other enabling machines and devices: Secondary | ICD-10-CM

## 2021-09-02 DIAGNOSIS — I10 Essential (primary) hypertension: Secondary | ICD-10-CM

## 2021-09-02 DIAGNOSIS — D62 Acute posthemorrhagic anemia: Secondary | ICD-10-CM | POA: Diagnosis not present

## 2021-09-02 DIAGNOSIS — C770 Secondary and unspecified malignant neoplasm of lymph nodes of head, face and neck: Secondary | ICD-10-CM | POA: Diagnosis not present

## 2021-09-02 DIAGNOSIS — E1159 Type 2 diabetes mellitus with other circulatory complications: Secondary | ICD-10-CM

## 2021-09-02 DIAGNOSIS — I2510 Atherosclerotic heart disease of native coronary artery without angina pectoris: Secondary | ICD-10-CM

## 2021-09-02 DIAGNOSIS — G4733 Obstructive sleep apnea (adult) (pediatric): Secondary | ICD-10-CM

## 2021-09-02 DIAGNOSIS — L8992 Pressure ulcer of unspecified site, stage 2: Secondary | ICD-10-CM

## 2021-09-02 DIAGNOSIS — Z9861 Coronary angioplasty status: Secondary | ICD-10-CM

## 2021-09-02 DIAGNOSIS — L8990 Pressure ulcer of unspecified site, unspecified stage: Secondary | ICD-10-CM | POA: Insufficient documentation

## 2021-09-02 LAB — TROPONIN I (HIGH SENSITIVITY)
Troponin I (High Sensitivity): 11 ng/L (ref <18)
Troponin I (High Sensitivity): 12 ng/L (ref <18)

## 2021-09-02 LAB — CBC
HCT: 35.8 % — ABNORMAL LOW (ref 39.0–52.0)
HCT: 34.6 % — ABNORMAL LOW (ref 39.0–52.0)
Hemoglobin: 11.4 g/dL — ABNORMAL LOW (ref 13.0–17.0)
Hemoglobin: 11.6 g/dL — ABNORMAL LOW (ref 13.0–17.0)
MCH: 28.9 pg (ref 26.0–34.0)
MCH: 29.4 pg (ref 26.0–34.0)
MCHC: 31.8 g/dL (ref 30.0–36.0)
MCHC: 33.5 g/dL (ref 30.0–36.0)
MCV: 90.9 fL (ref 80.0–100.0)
MCV: 87.6 fL (ref 80.0–100.0)
Platelets: 440 10*3/uL — ABNORMAL HIGH (ref 150–400)
Platelets: 417 10*3/uL — ABNORMAL HIGH (ref 150–400)
RBC: 3.94 MIL/uL — ABNORMAL LOW (ref 4.22–5.81)
RBC: 3.95 MIL/uL — ABNORMAL LOW (ref 4.22–5.81)
RDW: 16.7 % — ABNORMAL HIGH (ref 11.5–15.5)
RDW: 16.8 % — ABNORMAL HIGH (ref 11.5–15.5)
WBC: 8.5 10*3/uL (ref 4.0–10.5)
WBC: 9.1 10*3/uL (ref 4.0–10.5)
nRBC: 0 % (ref 0.0–0.2)
nRBC: 0 % (ref 0.0–0.2)

## 2021-09-02 LAB — BASIC METABOLIC PANEL
Anion gap: 7 (ref 5–15)
BUN: 45 mg/dL — ABNORMAL HIGH (ref 8–23)
CO2: 21 mmol/L — ABNORMAL LOW (ref 22–32)
Calcium: 8.7 mg/dL — ABNORMAL LOW (ref 8.9–10.3)
Chloride: 109 mmol/L (ref 98–111)
Creatinine, Ser: 1.45 mg/dL — ABNORMAL HIGH (ref 0.61–1.24)
GFR, Estimated: 50 mL/min — ABNORMAL LOW (ref >60)
Glucose, Bld: 106 mg/dL — ABNORMAL HIGH (ref 70–99)
Potassium: 4.7 mmol/L (ref 3.5–5.1)
Sodium: 137 mmol/L (ref 135–145)

## 2021-09-02 LAB — GLUCOSE, CAPILLARY
Glucose-Capillary: 106 mg/dL — ABNORMAL HIGH (ref 70–99)
Glucose-Capillary: 111 mg/dL — ABNORMAL HIGH (ref 70–99)

## 2021-09-02 LAB — LACTIC ACID, PLASMA: Lactic Acid, Venous: 1.7 mmol/L (ref 0.5–1.9)

## 2021-09-02 LAB — PROCALCITONIN: Procalcitonin: 0.41 ng/mL

## 2021-09-02 LAB — MAGNESIUM: Magnesium: 1.8 mg/dL (ref 1.7–2.4)

## 2021-09-02 LAB — MRSA NEXT GEN BY PCR, NASAL: MRSA by PCR Next Gen: NOT DETECTED

## 2021-09-02 MED ORDER — POLYVINYL ALCOHOL 1.4 % OP SOLN: 1.0000 [drp] | Freq: Three times a day (TID) | OPHTHALMIC | Status: DC | PRN

## 2021-09-02 MED ORDER — INSULIN ASPART 100 UNIT/ML IJ SOLN: 0.0000 [IU] | Freq: Every day | INTRAMUSCULAR | Status: DC

## 2021-09-02 MED ORDER — ATORVASTATIN CALCIUM 40 MG PO TABS
80.0000 mg | ORAL_TABLET | Freq: Every day | ORAL | Status: DC
  Administered 2021-09-02 – 2021-09-04 (×3): 80 mg via ORAL
  Filled 2021-09-02 (×3): qty 2

## 2021-09-02 MED ORDER — EZETIMIBE 10 MG PO TABS
10.0000 mg | ORAL_TABLET | Freq: Every day | ORAL | Status: DC
  Administered 2021-09-03 – 2021-09-04 (×2): 10 mg via ORAL
  Filled 2021-09-02 (×2): qty 1

## 2021-09-02 MED ORDER — LACTATED RINGERS IV SOLN
INTRAVENOUS | Status: DC
Start: 2021-09-02 — End: 2021-09-04

## 2021-09-02 MED ORDER — METOPROLOL TARTRATE 25 MG PO TABS
25.0000 mg | ORAL_TABLET | Freq: Two times a day (BID) | ORAL | Status: DC
  Administered 2021-09-02 – 2021-09-04 (×4): 25 mg via ORAL
  Filled 2021-09-02 (×4): qty 1

## 2021-09-02 MED ORDER — INSULIN ASPART 100 UNIT/ML IJ SOLN
0.0000 [IU] | Freq: Three times a day (TID) | INTRAMUSCULAR | Status: DC
  Administered 2021-09-03 – 2021-09-04 (×2): 2 [IU] via SUBCUTANEOUS

## 2021-09-02 MED ORDER — ACETAMINOPHEN 10 MG/ML IV SOLN: 1000.0000 mg | Freq: Three times a day (TID) | INTRAVENOUS | Status: DC | PRN

## 2021-09-02 MED ORDER — FOLIC ACID 1 MG PO TABS
1.0000 mg | ORAL_TABLET | Freq: Every day | ORAL | Status: DC
  Administered 2021-09-02 – 2021-09-04 (×3): 1 mg via ORAL
  Filled 2021-09-02 (×3): qty 1

## 2021-09-02 MED ORDER — LATANOPROST 0.005 % OP SOLN
1.0000 [drp] | Freq: Every day | OPHTHALMIC | Status: DC
  Administered 2021-09-03: 1 [drp] via OPHTHALMIC
  Filled 2021-09-02: qty 2.5

## 2021-09-02 NOTE — Assessment & Plan Note (Signed)
-   CPAP at night °

## 2021-09-02 NOTE — Assessment & Plan Note (Addendum)
Recent Labs  ?  08/27/21 ?0228 08/27/21 ?0632 08/27/21 ?2136 08/28/21 ?0100 08/29/21 ?0132 09/01/21 ?1915 09/01/21 ?2310 09/02/21 ?0759 09/02/21 ?1405 09/03/21 ?0250  ?HGB 7.4* 7.1* 9.1* 9.2* 9.2* 7.1* 9.2* 11.4* 11.6* 11.3*  ?Patient was hypotensive to 60/40 per EMS.  He was transfused 3 units with appropriate response.  Recent EGD on 4/10 with one large, deep, non-bleeding duodenal ulcer with pigmented material in duodenal bulb.  Now hemodynamically stable except for mild tachycardia.  H&H stable after 3 units.  Reportedly had a small bloody BMs this morning but no major bleeding. ?-GI recommended IR consult for possible empiric embolization  ?-IR consulted but did not feel embolization is indicated now ?-Advance to full liquid diet today ?-Change Protonix to p.o. ?-Monitor H&H.  Transfuse for Hgb less than 8.0 given history of CAD ?-Transfer to telemetry. ? ?

## 2021-09-02 NOTE — Assessment & Plan Note (Signed)
Followed by Dr. Julien Nordmann.  Continues to endorse left shoulder and neck pain likely from radiation treatment.  CT of soft tissue neck as above. ?-Added oncology to treatment team ?

## 2021-09-02 NOTE — Assessment & Plan Note (Signed)
Remote stenting.  No chest pain or dyspnea. ?-Resume home meds ?

## 2021-09-02 NOTE — Plan of Care (Signed)
Discussed with the patient plan of care for the evening, pain management and bowel movements with some teach back displayed.  Encouraged to press cal bell anytime he feels the urge to have a bowel movement. ? ?Problem: Education: ?Goal: Knowledge of General Education information will improve ?Description: Including pain rating scale, medication(s)/side effects and non-pharmacologic comfort measures ?Outcome: Progressing ?  ?Problem: Health Behavior/Discharge Planning: ?Goal: Ability to manage health-related needs will improve ?Outcome: Progressing ?  ?

## 2021-09-02 NOTE — Progress Notes (Signed)
Received consult request for upper GI bleed on Mr. Soo.  After review with Dr. Vernard Gambles, it was determined in the absence of confirmed site for active bleed and in the presence of atherosclerotic disease and renal insufficiency, an image guided intervention at this time is not indicated.  If a change in clinical status, the case can be reviewed once again.   ? ? ?Electronically Signed: ?Pasty Spillers, PA-C ?09/02/2021, 5:05 PM ?  ?

## 2021-09-02 NOTE — Hospital Course (Addendum)
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 Protonix twice daily. ? ?Patient returns with brisk interval bleeding and hypotension, and admitted for hemorrhagic shock/ABLA due to GI bleed.  Hgb down to 7.2 (from 9.1 on 4/12).  He was transfused 3 units with appropriate response.  GI consulted and recommended IR consult for possible empiric embolization but IR did not feel this is indicated now.  H&H remained stable.  Advancing diet. ? ?Patient has left neck/shoulder pain at the area of radiation.  CT soft tissue neck redemonstrated large left supraclavicular and left posterior neck malignancy involving bones, muscles and lymph nodes with increased left> right pleural effusion. ?

## 2021-09-02 NOTE — Progress Notes (Addendum)
?PROGRESS NOTE ? ?Trevor Bailey VCB:[id number] DOB: 1946/05/11  ? ?PCP: [name], MD ? ?Patient is from: Home.  Lives with his wife.  Independently ambulates at baseline. ? ?DOA: 09/01/2021 LOS: 1 ? ?Chief complaints ?Chief Complaint  ?Patient presents with  ? Hypotension  ? Shoulder Pain  ?  ? 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 Protonix twice daily. ? ?Patient returns with brisk interval bleeding and hypotension, and admitted for hemorrhagic shock/ABLA due to GI bleed.  Hgb down to 7.2 (from 9.1 on 4/12).  He was transfused 3 units with appropriate response.  GI consulted and recommended IR consult for possible embolization given large size duodenal ulcer that cannot be intervened endoscopically.  ? ?Patient has left neck/shoulder pain at the area of radiation.  CT soft tissue neck redemonstrated large left supraclavicular and left posterior neck malignancy involving bones, muscles and lymph nodes with increased left> right pleural effusion.  ? ?Subjective: ?Seen and examined earlier this morning.  No major events overnight of this morning.  No complaints other than left shoulder pain.  Has not had a bowel movement when I saw him but he has a small BMs with some blood per RN later in the morning.  He denies chest pain, dyspnea, palpitation or lightheadedness.  Hemodynamically stable except for mild tachycardia.  ? ?Objective: ?Vitals:  ? 09/02/21 0800 09/02/21 1214 09/02/21 1300 09/02/21 1600  ?BP: 116/78 111/71 108/75   ?Pulse: (!) 101 99 (!) 108   ?Resp: 16 14 14    ?Temp: 98.2 ?F (36.8 ?C)   97.7 ?F (36.5 ?C)  ?TempSrc: Oral   Oral  ?SpO2: 100% 99% 100%   ?Weight:      ?Height:       ? ? ?Examination: ? ?GENERAL: No apparent distress.  Nontoxic. ?HEENT: MMM.  Vision and hearing grossly intact.  ?NECK: Soft tissue swelling and inflammation with central ulceration over left neck and adjacent shoulder ?RESP:  No IWOB.  Fair aeration bilaterally. ?CVS:  RRR. Heart sounds normal.  ?ABD/GI/GU: BS+. Abd soft, NTND.  ?MSK/EXT:  Moves extremities.  Seems to have frozen left shoulder. ?SKIN: Swelling and discoloration of skin is over left neck and adjacent shoulder with central ulceration.  No fluctuance or signs of infection.  See picture below for more ?NEURO: Awake, alert and oriented appropriately.  No apparent focal neuro deficit. ?PSYCH: Calm. Normal affect.  ? ? ?Procedures:  ?None ? ?Microbiology summarized: ?MRSA PCR screen negative. ? ?Assessment and Plan: ?* Hemorrhagic shock/ABLA due to upper GI bleed ?Recent Labs  ?  08/26/21 ?1454 08/27/21 ?0228 08/27/21 ?0632 08/27/21 ?2136 08/28/21 ?0100 08/29/21 ?0132 09/01/21 ?1915 09/01/21 ?2310 09/02/21 ?0759 09/02/21 ?1405  ?HGB 7.2* 7.4* 7.1* 9.1* 9.2* 9.2* 7.1* 9.2* 11.4* 11.6*  ?Patient was hypotensive to 60/40 per EMS.  He was transfused 3 units with appropriate response.  Recent EGD on 4/10 with one large, deep, non-bleeding duodenal ulcer with pigmented material in duodenal bulb.  Now hemodynamically stable except for mild tachycardia.  H&H stable after 3 units.  Reportedly had a small bloody BMs this morning but no major bleeding. ?-GI recommended IR consult for possible empiric embolization  ?-IR consulted ?-Allow clear liquid diet for  now ?-Continue Protonix drip and transition to twice daily ?-Monitor H&H.  Transfuse for Hgb less than 8.0 given history of CAD ?-Transfer to telemetry. ? ? 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 Julien Nordmann.  Continues to endorse left shoulder and neck pain likely from radiation treatment.  CT of soft tissue neck as above. ?-Added oncology to treatment  team ? ?Paroxysmal atrial fibrillation ([facility]) ?Seems to be in sinus rhythm with ectopic beats.  Rate controlled. ?-Resume home metoprolol. ?-Off anticoagulation due to GI bleed. ? 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 ?Recent Labs  ?Lab 08/28/21 ?1620 08/28/21 ?1939 08/28/21 ?2037 08/28/21 ?2340 08/29/21 ?0342  ?GLUCAP 176* 146* 157* 95 118*  ?-Start CBG monitoring and SSI-sensitive ?-Hold home metformin. ?-Continue statin ?-Does not seem to take gabapentin at home. ? ? ?Essential hypertension ?Resume home meds starting with metoprolol ? ?OSA on CPAP ?CPAP at night ? ?CAD S/P percutaneous coronary angioplasty ?Remote stenting.  No chest pain or dyspnea. ?-Resume home meds ? ? ?Pressure skin injury: POA ?Patient has left neck and shoulder ulceration from radiation and metastatic lung cancer. ?Pressure Injury 09/02/21 Sacrum Medial Stage 1 -  Intact skin with non-blanchable redness of a localized area usually over a bony prominence. discoloration, tenderness (Active)  ?09/02/21 0045  ?Location: Sacrum  ?Location Orientation: Medial  ?Staging: Stage 1 -  Intact skin with non-blanchable redness of a localized area usually over a bony prominence.  ?Wound Description (Comments): discoloration, tenderness  ?Present on Admission: Yes  ? ?DVT prophylaxis:  ?SCDs Start: 09/01/21 2255 due to GI bleed. ? ?Code Status: Full code ?Family Communication: Patient and/or RN. Available if any question.  ?Level of care: Stepdown.  Transferred to progressive care. ?Status is: Inpatient ?Remains inpatient appropriate because: Due to hemorrhagic shock from GI bleed ? ? ?Final disposition: TBD ?Consultants:  ?[facility] gastro[facility]ology ?Interventional radiology ?Oncology ? ?Sch Meds:  ?Scheduled Meds: ? atorvastatin  80 mg Oral Daily  ? Chlorhexidine Gluconate Cloth  6 each Topical Daily  ? [START ON 09/03/2021] ezetimibe  10 mg Oral Daily  ? folic acid  1 mg Oral Daily  ? insulin aspart  0-5  Units Subcutaneous QHS  ? [START ON 09/03/2021] insulin aspart  0-9 Units Subcutaneous TID WC  ? latanoprost  1 drop Both Eyes QHS  ? mouth rinse  15 mL Mouth Rinse BID  ? metoprolol tartrate  25 mg Oral BID  ? ?Continuous Infusions: ? acetaminophen    ? lactated ringers    ? pantoprazole 8 mg/hr (09/02/21 0900)  ? ?PRN Meds:.acetaminophen, acetaminophen **OR** acetaminophen, albuterol, ondansetron **OR** ondansetron (ZOFRAN) IV, polyvinyl alcohol ? ?Antimicrobials: ?Anti-infectives (From admission, onward)  ? ? None  ? ?  ? ? ? ?I have personally reviewed the following labs and images: ?CBC: ?Recent Labs  ?Lab 08/27/21 ?0228 08/27/21 ?0632 08/28/21 ?0100 08/29/21 ?0132 09/01/21 ?1915 09/01/21 ?2310 09/02/21 ?0759 09/02/21 ?1405  ?WBC 7.7   < > 7.8 8.0 11.0*  --  8.5 9.1  ?NEUTROABS 6.2  --  6.4 6.4 9.4*  --   --   --   ?HGB 7.4*   < > 9.2* 9.2* 7.1* 9.2* 11.4* 11.6*  ?HCT 23.0*   < > 27.7* 28.2* 22.6*  --  35.8* 34.6*  ?MCV 86.5   < > 87.9 89.0 92.6  --  90.9 87.6  ?PLT 417*   < > 441* 446* 554*  --  440* 417*  ? < > = values in this interval not displayed.  ? ?BMP &GFR ?Recent Labs  ?Lab 08/27/21 ?0228 08/28/21 ?0100 08/29/21 ?0132 09/01/21 ?1915 09/02/21 ?0759  ?NA 137 138 137 136 137  ?K 3.6 3.6 3.5 4.7 4.7  ?CL 109 108 107 111 109  ?CO2 22 22 22  18* 21*  ?GLUCOSE 142* 115* 106* 115* 106*  ?BUN 30* 18 19 48* 45*  ?CREATININE 1.11 0.92 0.96 1.80* 1.45*  ?CALCIUM 8.8* 8.8* 8.9 8.2* 8.7*  ?MG 2.0 2.0 1.9  --  1.8  ? ?Estimated Creatinine Clearance: 43 mL/min (A) (by C-G formula based on SCr of 1.45 mg/dL (H)). ?Liver & Pancreas: ?Recent Labs  ?Lab 08/27/21 ?0228 08/28/21 ?0100 08/29/21 ?0132 09/01/21 ?1915  ?AST 20 27 30 17   ?ALT 17 24 30 17   ?ALKPHOS 72 89 102 95  ?BILITOT 0.7 0.9 1.1 0.4  ?PROT 5.2* 5.3* 5.5* 4.8*  ?ALBUMIN 1.6* 1.6* 1.6* 1.7*  ? ?No results for input(s): LIPASE, AMYLASE in the last 168 hours. ?No results for input(s): AMMONIA in the last 168 hours. ?Diabetic: ?No results for input(s): HGBA1C in  the last 72 hours. ?Recent Labs  ?Lab 08/28/21 ?1620 08/28/21 ?1939 08/28/21 ?2037 08/28/21 ?2340 08/29/21 ?0342  ?GLUCAP 176* 146* 157* 95 118*  ? ?Cardiac Enzymes: ?No results for input(s): CKTOTAL, CKMB, CKMBINDEX, TROPONI

## 2021-09-02 NOTE — Assessment & Plan Note (Addendum)
A1c 6.9% about a week ago. ?Recent Labs  ?Lab 08/29/21 ?0342 09/02/21 ?1718 09/02/21 ?2142 09/03/21 ?9150 09/03/21 ?1139  ?GLUCAP 118* 106* 111* 109* 167*  ?-Start CBG monitoring and SSI-sensitive ?-Continue statin. ?-Does not seem to take gabapentin at home. ?-Continue holding home metformin. ? ?

## 2021-09-02 NOTE — Assessment & Plan Note (Addendum)
Seems to be in sinus rhythm with ectopic beats.  Rate controlled. ?-Resume home metoprolol. ?-Off anticoagulation due to GI bleed. ?

## 2021-09-02 NOTE — Assessment & Plan Note (Signed)
Resume home meds starting with metoprolol ?

## 2021-09-03 ENCOUNTER — Telehealth (HOSPITAL_COMMUNITY): Payer: Self-pay

## 2021-09-03 DIAGNOSIS — N179 Acute kidney failure, unspecified: Secondary | ICD-10-CM

## 2021-09-03 DIAGNOSIS — R5381 Other malaise: Secondary | ICD-10-CM

## 2021-09-03 LAB — RENAL FUNCTION PANEL
Albumin: 1.8 g/dL — ABNORMAL LOW (ref 3.5–5.0)
Anion gap: 7 (ref 5–15)
BUN: 28 mg/dL — ABNORMAL HIGH (ref 8–23)
CO2: 22 mmol/L (ref 22–32)
Calcium: 8.7 mg/dL — ABNORMAL LOW (ref 8.9–10.3)
Chloride: 108 mmol/L (ref 98–111)
Creatinine, Ser: 0.99 mg/dL (ref 0.61–1.24)
GFR, Estimated: >60 mL/min (ref >60)
Glucose, Bld: 105 mg/dL — ABNORMAL HIGH (ref 70–99)
Phosphorus: 2.3 mg/dL — ABNORMAL LOW (ref 2.5–4.6)
Potassium: 4.2 mmol/L (ref 3.5–5.1)
Sodium: 137 mmol/L (ref 135–145)

## 2021-09-03 LAB — CBC
HCT: 33.9 % — ABNORMAL LOW (ref 39.0–52.0)
Hemoglobin: 11.3 g/dL — ABNORMAL LOW (ref 13.0–17.0)
MCH: 29.2 pg (ref 26.0–34.0)
MCHC: 33.3 g/dL (ref 30.0–36.0)
MCV: 87.6 fL (ref 80.0–100.0)
Platelets: 433 10*3/uL — ABNORMAL HIGH (ref 150–400)
RBC: 3.87 MIL/uL — ABNORMAL LOW (ref 4.22–5.81)
RDW: 16.5 % — ABNORMAL HIGH (ref 11.5–15.5)
WBC: 7.1 10*3/uL (ref 4.0–10.5)
nRBC: 0 % (ref 0.0–0.2)

## 2021-09-03 LAB — GLUCOSE, CAPILLARY
Glucose-Capillary: 109 mg/dL — ABNORMAL HIGH (ref 70–99)
Glucose-Capillary: 167 mg/dL — ABNORMAL HIGH (ref 70–99)
Glucose-Capillary: 92 mg/dL (ref 70–99)
Glucose-Capillary: 113 mg/dL — ABNORMAL HIGH (ref 70–99)

## 2021-09-03 LAB — MAGNESIUM: Magnesium: 1.3 mg/dL — ABNORMAL LOW (ref 1.7–2.4)

## 2021-09-03 MED ORDER — MAGNESIUM SULFATE 2 GM/50ML IV SOLN
2.0000 g | Freq: Once | INTRAVENOUS | Status: AC
  Administered 2021-09-03: 2 g via INTRAVENOUS
  Filled 2021-09-03: qty 50

## 2021-09-03 MED ORDER — PANTOPRAZOLE SODIUM 40 MG PO TBEC
40.0000 mg | DELAYED_RELEASE_TABLET | Freq: Two times a day (BID) | ORAL | Status: DC
  Administered 2021-09-03 – 2021-09-04 (×3): 40 mg via ORAL
  Filled 2021-09-03 (×3): qty 1

## 2021-09-03 MED ORDER — ACETAMINOPHEN 500 MG PO TABS
1000.0000 mg | ORAL_TABLET | Freq: Three times a day (TID) | ORAL | Status: DC
  Administered 2021-09-03 – 2021-09-04 (×3): 1000 mg via ORAL
  Filled 2021-09-03 (×3): qty 2

## 2021-09-03 MED ORDER — OXYCODONE HCL 5 MG PO TABS
5.0000 mg | ORAL_TABLET | Freq: Four times a day (QID) | ORAL | Status: DC | PRN
  Administered 2021-09-03 – 2021-09-04 (×2): 5 mg via ORAL
  Filled 2021-09-03 (×2): qty 1

## 2021-09-03 NOTE — Assessment & Plan Note (Signed)
PT/OT eval ?

## 2021-09-03 NOTE — Progress Notes (Signed)
?PROGRESS NOTE ? ?Trevor Bailey QMV:[id number] DOB: Nov 27, 1945  ? ?PCP: [name], MD ? ?Patient is from: Home.  Lives with his wife.  Independently ambulates at baseline. ? ?DOA: 09/01/2021 LOS: 2 ? ?Chief complaints ?Chief Complaint  ?Patient presents with  ? Hypotension  ? Shoulder Pain  ?  ? 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 Protonix twice daily. ? ?Patient returns with brisk interval bleeding and hypotension, and admitted for hemorrhagic shock/ABLA due to GI bleed.  Hgb down to 7.2 (from 9.1 on 4/12).  He was transfused 3 units with appropriate response.  GI consulted and recommended IR consult for possible empiric embolization but IR did not feel this is indicated now.  H&H remained stable.  Advancing diet. ? ?Patient has left neck/shoulder pain at the area of radiation.  CT soft tissue neck redemonstrated large left supraclavicular and left posterior neck malignancy involving bones, muscles and lymph nodes with increased left> right pleural effusion.  ? ?Subjective: ?Seen and examined earlier this morning.  No major events overnight of this morning.  Feels well except for left shoulder and leg pain from metastatic cancer.  Denies chest pain, dyspnea, palpitation, dizziness, nausea, vomiting or abdominal pain.  No further bleeding or bowel movement.  Tolerated clear liquid diet. ? ?Objective: ?Vitals:  ? 09/03/21 1000 09/03/21 1100 09/03/21 1200 09/03/21 1300  ?BP: (!) 146/72 116/78 121/78 105/66  ?Pulse: 98 99 79 73  ?Resp: 18 15 13 11   ?Temp:   98 ?F (36.7 ?C)   ?TempSrc:   Oral   ?SpO2: 100% 100% 100% 99%  ?Weight:      ?Height:      ? ? ?Examination: ? ?GENERAL: No apparent distress.   Nontoxic. ?HEENT: MMM.  Vision and hearing grossly intact.  ?NECK: Soft tissue swelling and inflammation with central ulceration over left neck and abdomen shoulder ?RESP:  No IWOB.  Fair aeration bilaterally. ?CVS:  RRR. Heart sounds normal.  ?ABD/GI/GU: BS+. Abd soft, NTND.  ?MSK/EXT:  Moves extremities. No apparent deformity. No edema.  ?SKIN: no apparent skin lesion or wound ?NEURO: Awake and alert. Oriented appropriately.  No apparent focal neuro deficit. ?PSYCH: Calm. Normal affect.  ? ?Left neck/shoulder ? ? ?Procedures:  ?None ? ?Microbiology summarized: ?MRSA PCR screen negative. ? ?Assessment and Plan: ?* Hemorrhagic shock/ABLA due to upper GI bleed ?Recent Labs  ?  08/27/21 ?0228 08/27/21 ?0632 08/27/21 ?2136 08/28/21 ?0100 08/29/21 ?0132 09/01/21 ?1915 09/01/21 ?2310 09/02/21 ?0759 09/02/21 ?1405 09/03/21 ?0250  ?HGB 7.4* 7.1* 9.1* 9.2* 9.2* 7.1* 9.2* 11.4* 11.6* 11.3*  ?Patient was hypotensive to 60/40 per EMS.  He was transfused 3 units with appropriate response.  Recent EGD on 4/10 with one large, deep, non-bleeding duodenal ulcer with pigmented material in duodenal bulb.  Now hemodynamically stable except for mild tachycardia.  H&H stable after 3 units.  Reportedly had a small bloody BMs this morning but no major bleeding. ?-GI recommended IR consult for possible empiric embolization  ?-IR consulted but did not feel embolization is indicated now ?-Advance to full liquid diet today ?-Change Protonix to p.o. ?-Monitor H&H.  Transfuse for Hgb less than 8.0 given history of CAD ?-Transfer to telemetry. ? ? 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 Julien Nordmann.  Continues to endorse left shoulder and neck pain likely from radiation treatment.  CT of soft tissue neck as above. ?-Added oncology to treatment team ? ?Paroxysmal atrial fibrillation (HCC) ?Seems to be in sinus rhythm with ectopic beats.  Rate controlled. ?-Resume home metoprolol. ?-Off  anticoagulation due to GI bleed. ? ?Physical deconditioning ?PT/OT eval ? 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 ?Recent Labs  ?Lab 08/29/21 ?0342 09/02/21 ?1718 09/02/21 ?2142 09/03/21 ?4097 09/03/21 ?1139  ?GLUCAP 118* 106* 111* 109* 167*  ?-Start CBG monitoring and SSI-sensitive ?-Continue statin. ?-Does not seem to take gabapentin at home. ?-Continue holding home metformin. ? ? ?Essential hypertension ?Resume home meds starting with metoprolol ? ?OSA on CPAP ?CPAP at night ? ?CAD S/P percutaneous coronary angioplasty ?Remote stenting.  No chest pain or dyspnea. ?-Resume home meds ? ? ?Pressure skin injury: POA ?Patient has left neck and shoulder ulceration from radiation and metastatic lung cancer. ?Pressure Injury 09/02/21 Sacrum Medial Stage 1 -  Intact skin with non-blanchable redness of a localized area usually over a bony prominence. discoloration, tenderness (Active)  ?09/02/21 0045  ?Location: Sacrum  ?Location Orientation: Medial  ?Staging: Stage 1 -  Intact skin with non-blanchable redness of a localized area usually over a bony prominence.  ?Wound Description (Comments): discoloration, tenderness  ?Present on Admission: Yes  ?Dressing Type Foam - Lift dressing to assess site every shift 09/03/21 0800  ? ?DVT prophylaxis:  ?SCDs Start: 09/01/21 2255 due to GI bleed. ? ?Code Status: Full code ?Family Communication: Updated patient's wife at bedside. ?Level of care: Telemetry.  ?Status is: Inpatient ?Remains inpatient appropriate because: Due to hemorrhagic shock from GI bleed and therapy evaluation for disposition ? ? ?Final disposition: Likely home with home meds after therapy evaluation. ?Consultants:  ?[facility]-signed off ?Interventional radiology-signed off ?Oncology ? ?Sch Meds:  ?Scheduled Meds: ? acetaminophen  1,000 mg Oral Q8H  ? atorvastatin  80 mg Oral Daily  ? Chlorhexidine Gluconate Cloth  6 each Topical Daily  ? ezetimibe  10  mg Oral Daily  ? folic acid  1 mg Oral Daily  ? insulin aspart  0-5 Units Subcutaneous QHS  ? insulin aspart  0-9 Units Subcutaneous TID WC  ? latanoprost  1 drop Both Eyes QHS  ? mouth rinse  15 mL Mouth Rinse BID  ? metoprolol tartrate  25 mg Oral BID  ? pantoprazole  40 mg Oral BID  ? ?Continuous Infusions: ? lactated ringers 75 mL/hr at 09/03/21 0800  ? ?PRN Meds:.albuterol, ondansetron **OR** ondansetron (ZOFRAN) IV, oxyCODONE, polyvinyl alcohol ? ?Antimicrobials: ?Anti-infectives (From admission, onward)  ? ? None  ? ?  ? ? ? ?I have personally reviewed the following labs and images: ?CBC: ?Recent Labs  ?Lab 08/28/21 ?0100 08/29/21 ?0132 09/01/21 ?1915 09/01/21 ?2310 09/02/21 ?0759 09/02/21 ?1405 09/03/21 ?0250  ?WBC 7.8 8.0 11.0*  --  8.5 9.1 7.1  ?NEUTROABS 6.4 6.4 9.4*  --   --   --   --   ?HGB 9.2* 9.2* 7.1* 9.2* 11.4* 11.6* 11.3*  ?HCT 27.7* 28.2* 22.6*  --  35.8* 34.6* 33.9*  ?MCV 87.9 89.0 92.6  --  90.9 87.6 87.6  ?PLT 441* 446* 554*  --  440* 417* 433*  ? ?BMP &GFR ?Recent Labs  ?Lab 08/28/21 ?0100 08/29/21 ?0132 09/01/21 ?1915 09/02/21 ?0759 09/03/21 ?0250  ?NA 138 137 136 137  137  ?K 3.6 3.5 4.7 4.7 4.2  ?CL 108 107 111 109 108  ?CO2 22 22 18* 21* 22  ?GLUCOSE 115* 106* 115* 106* 105*  ?BUN 18 19 48* 45* 28*  ?CREATININE 0.92 0.96 1.80* 1.45* 0.99  ?CALCIUM 8.8* 8.9 8.2* 8.7* 8.7*  ?MG 2.0 1.9  --  1.8 1.3*  ?PHOS  --   --   --   --  2.3*  ? ?Estimated Creatinine Clearance: 63.2 mL/min (by C-G formula based on SCr of 0.99 mg/dL). ?Liver & Pancreas: ?Recent Labs  ?Lab 08/28/21 ?0100 08/29/21 ?0132 09/01/21 ?1915 09/03/21 ?0250  ?AST 27 30 17   --   ?ALT 24 30 17   --   ?ALKPHOS 89 102 95  --   ?BILITOT 0.9 1.1 0.4  --   ?PROT 5.3* 5.5* 4.8*  --   ?ALBUMIN 1.6* 1.6* 1.7* 1.8*  ? ?No results for input(s): LIPASE, AMYLASE in the last 168 hours. ?No results for input(s): AMMONIA in the last 168 hours. ?Diabetic: ?No results for input(s): HGBA1C in the last 72 hours. ?Recent Labs  ?Lab 08/29/21 ?0342  09/02/21 ?1718 09/02/21 ?2142 09/03/21 ?5284 09/03/21 ?1139  ?GLUCAP 118* 106* 111* 109* 167*  ? ?Cardiac Enzymes: ?No results for input(s): CKTOTAL, CKMB, CKMBINDEX, TROPONINI in the last 168 hours. ?No results for input(s

## 2021-09-03 NOTE — Assessment & Plan Note (Signed)
Recent Labs  ?  04/25/21 ?4431 07/22/21 ?1032 08/25/21 ?0230 08/26/21 ?5400 08/27/21 ?0228 08/28/21 ?0100 08/29/21 ?0132 09/01/21 ?1915 09/02/21 ?0759 09/03/21 ?0250  ?BUN 35* 29* 75* 55* 30* 18 19 48* 45* 28*  ?CREATININE 1.46* 1.25* 1.20 1.15 1.11 0.92 0.96 1.80* 1.45* 0.99  ?Resolved. D/c IVF ?

## 2021-09-03 NOTE — Assessment & Plan Note (Signed)
Mg 1.3.  Received 2 g IV magnesium sulfate ?-Recheck in the morning ?

## 2021-09-04 LAB — CBC
HCT: 34.6 % — ABNORMAL LOW (ref 39.0–52.0)
Hemoglobin: 11.2 g/dL — ABNORMAL LOW (ref 13.0–17.0)
MCH: 28.9 pg (ref 26.0–34.0)
MCHC: 32.4 g/dL (ref 30.0–36.0)
MCV: 89.4 fL (ref 80.0–100.0)
Platelets: 470 10*3/uL — ABNORMAL HIGH (ref 150–400)
RBC: 3.87 MIL/uL — ABNORMAL LOW (ref 4.22–5.81)
RDW: 16.2 % — ABNORMAL HIGH (ref 11.5–15.5)
WBC: 6.7 10*3/uL (ref 4.0–10.5)
nRBC: 0 % (ref 0.0–0.2)

## 2021-09-04 LAB — RENAL FUNCTION PANEL
Albumin: 1.8 g/dL — ABNORMAL LOW (ref 3.5–5.0)
Anion gap: 7 (ref 5–15)
BUN: 17 mg/dL (ref 8–23)
CO2: 24 mmol/L (ref 22–32)
Calcium: 8.6 mg/dL — ABNORMAL LOW (ref 8.9–10.3)
Chloride: 104 mmol/L (ref 98–111)
Creatinine, Ser: 0.85 mg/dL (ref 0.61–1.24)
GFR, Estimated: >60 mL/min (ref >60)
Glucose, Bld: 98 mg/dL (ref 70–99)
Phosphorus: 2.5 mg/dL (ref 2.5–4.6)
Potassium: 4.3 mmol/L (ref 3.5–5.1)
Sodium: 135 mmol/L (ref 135–145)

## 2021-09-04 LAB — GLUCOSE, CAPILLARY
Glucose-Capillary: 99 mg/dL (ref 70–99)
Glucose-Capillary: 181 mg/dL — ABNORMAL HIGH (ref 70–99)

## 2021-09-04 LAB — MAGNESIUM: Magnesium: 1.4 mg/dL — ABNORMAL LOW (ref 1.7–2.4)

## 2021-09-04 MED ORDER — MAGNESIUM SULFATE 2 GM/50ML IV SOLN
2.0000 g | Freq: Once | INTRAVENOUS | Status: AC
  Administered 2021-09-04: 2 g via INTRAVENOUS
  Filled 2021-09-04: qty 50

## 2021-09-04 MED ORDER — OXYCODONE HCL 5 MG PO TABS: 5.0000 mg | ORAL_TABLET | Freq: Four times a day (QID) | ORAL | 0 refills | Status: DC | PRN

## 2021-09-04 MED ORDER — ACETAMINOPHEN 500 MG PO TABS
1000.0000 mg | ORAL_TABLET | Freq: Three times a day (TID) | ORAL | 0 refills | Status: DC
Start: 2021-09-04 — End: 2021-09-10

## 2021-09-04 MED ORDER — ONDANSETRON HCL 4 MG PO TABS
4.0000 mg | ORAL_TABLET | Freq: Four times a day (QID) | ORAL | 0 refills | Status: AC | PRN
Start: 2021-09-04 — End: ?

## 2021-09-04 NOTE — TOC Progression Note (Signed)
Transition of Care (TOC) - Progression Note  ? ? ?Patient Details  ?Name: Trevor Bailey ?MRN: [id number] ?Date of Birth: 20-Jun-1945 ? ?Transition of Care (TOC) CM/SW Contact  ?Purcell Mouton, RN ?Phone Number: ?09/04/2021, 12:01 PM ? ?Clinical Narrative:    ? ?Spoke with pt's wife concerning Home Health. [facility], referral given to in house rep [place].   ? ?  ?  ? ?Expected Discharge Plan and Services ?  ?  ?  ?  ?  ?Expected Discharge Date: 09/04/21               ?  ?  ?  ?  ?  ?  ?  ?  ?  ?  ? ? ?Social Determinants of Health (SDOH) Interventions ?  ? ?Readmission Risk Interventions ?   ? View : No data to display.  ?  ?  ?  ? ? ?

## 2021-09-04 NOTE — Plan of Care (Signed)

## 2021-09-04 NOTE — Evaluation (Signed)
Occupational Therapy Evaluation ?Patient Details ?Name: Trevor Bailey ?MRN: [id number] ?DOB: Jul 20, 1945 ?Today's Date: 09/04/2021 ? ? ?History of Present Illness Patient is a 76 year old male admitted with bleeding and hypotension, pt dx with hemorrhagic shock/ABLA due to GI bleed. Patient with recent hospitalization for GI bleed received 4 units RBC. 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 ?Clinical Impression ?  ?Patient lives with spouse and is primarily independent with self care tasks. Patient presents with L shoulder, neck pain limiting overall strength and ROM due to radiation therapy. Patient able to perform stand pivot transfer to recliner chair at min G level, and able to use bilateral upper extremities to feed himself. Recommend continued acute OT services to maximize functional use of L UE, increase overall activity tolerance in order to D/C home with spouse support.   ?   ? ?Recommendations for follow up therapy are one component of a multi-disciplinary discharge planning process, led by the attending physician.  Recommendations may be updated based on patient status, additional functional criteria and insurance authorization.  ? ?Follow Up Recommendations ? Home health OT  ?  ?Assistance Recommended at Discharge PRN  ?Patient can return home with the following A little help with walking and/or transfers;A little help with bathing/dressing/bathroom;Assistance with cooking/housework ? ?  ?Functional Status Assessment ? Patient has had a recent decline in their functional status and demonstrates the ability to make significant improvements in function in a reasonable and predictable amount of time.  ?Equipment Recommendations ? None recommended by OT  ?  ?   ?Precautions / Restrictions Precautions ?Precautions: Fall ?Precaution Comments: cervical and left shoulder limitations and pain secondary to radiation ?Restrictions ?Weight  Bearing Restrictions: No  ? ?  ? ?Mobility Bed Mobility ?Overal bed mobility: Modified Independent ?  ?  ?  ?  ?  ?  ?  ?  ? ? ? ?  ?Balance Overall balance assessment: Mild deficits observed, not formally tested ?  ?  ?  ?  ?  ?  ?  ?  ?  ?  ?  ?  ?  ?  ?  ?  ?  ?  ?   ? ?ADL either performed or assessed with clinical judgement  ? ?ADL Overall ADL's : Needs assistance/impaired ?Eating/Feeding: Independent;Sitting ?  ?Grooming: Set up;Sitting ?  ?Upper Body Bathing: Minimal assistance;Sitting ?  ?Lower Body Bathing: Minimal assistance;Sitting/lateral leans;Sit to/from stand ?  ?Upper Body Dressing : Minimal assistance;Sitting ?  ?Lower Body Dressing: Minimal assistance;Sit to/from stand ?  ?Toilet Transfer: Min guard;Stand-pivot ?Toilet Transfer Details (indicate cue type and reason): Patient is able to power up to standing and pivot to recliner chair at min G level ?Toileting- Clothing Manipulation and Hygiene: Minimal assistance;Sit to/from stand ?  ?  ?  ?Functional mobility during ADLs: Min guard ?General ADL Comments: Patient presents with decreased activity tolerance 2* pain Lneck/shoulder  ? ? ? ?   ?   ?   ?   ? ?Pertinent Vitals/Pain Pain Assessment ?Pain Assessment: 0-10 ?Pain Score: 10-Worst pain ever ?Pain Location: left neck and shoulder ?Pain Descriptors / Indicators: Aching, Discomfort, Grimacing ?Pain Intervention(s): Patient requesting pain meds-RN notified  ? ? ? ?Hand Dominance Right ?  ?Extremity/Trunk Assessment Upper Extremity Assessment ?Upper Extremity Assessment: LUE deficits/detail ?LUE Deficits / Details: Patient with limited elbow flexion ~90 degrees 2* pain, significantly limited shoulder flexion ~30 degrees ?LUE: Unable to  fully assess due to pain ?  ?Lower Extremity Assessment ?Lower Extremity Assessment: Defer to PT evaluation ?  ?Cervical / Trunk Assessment ?Cervical / Trunk Assessment: Other exceptions ?Cervical / Trunk Exceptions: Patient tilts his head to right side due to L  shoulder/neck pain 2* radiation ?  ?Communication Communication ?Communication: No difficulties ?  ?Cognition Arousal/Alertness: Awake/alert ?Behavior During Therapy: [facility] for tasks assessed/performed ?Overall Cognitive Status: Within Functional Limits for tasks assessed ?  ?  ?  ?  ?  ?  ?  ?  ?  ?  ?  ?  ?  ?  ?  ?  ?  ?  ?  ?   ?Exercises Exercises: Other exercises ?Other Exercises ?Other Exercises: encourage patient to perform A/AAROM L UE including elbow and shoulder to maximize functional use ?  ?   ? ? ?Home Living Family/patient expects to be discharged to:: Private residence ?Living Arrangements: Spouse/significant other ?Available Help at Discharge: Family ?Type of Home: House ?Home Access: Stairs to enter ?Entrance Stairs-Number of Steps: 2 ?  ?Home Layout: One level ?  ?  ?Bathroom Shower/Tub: Walk-in shower ?  ?Bathroom Toilet: Handicapped height ?  ?  ?Home Equipment: Shower seat - built in;Grab bars - toilet;Grab bars - tub/shower ?  ?  ?  ? ?  ?Prior Functioning/Environment Prior Level of Function : Independent/Modified Independent ?  ?  ?  ?  ?  ?  ?  ?  ?  ? ?  ?  ?OT Problem List: Decreased strength;Decreased range of motion;Decreased coordination;Decreased activity tolerance;Pain;Impaired balance (sitting and/or standing) ?  ?   ?OT Treatment/Interventions: Self-care/ADL training;Patient/family education;Balance training;Therapeutic activities;DME and/or AE instruction;Therapeutic exercise  ?  ?OT Goals(Current goals can be found in the care plan section) Acute Rehab OT Goals ?Patient Stated Goal: Less pain ?OT Goal Formulation: With patient ?Time For Goal Achievement: 09/18/21 ?Potential to Achieve Goals: Good  ?OT Frequency: Min 2X/week ?  ? ?   ?AM-PAC OT "6 Clicks" Daily Activity     ?Outcome Measure Help from another person eating meals?: None ?Help from another person taking care of personal grooming?: A Little ?Help from another person toileting, which includes using toliet, bedpan, or  urinal?: A Little ?Help from another person bathing (including washing, rinsing, drying)?: A Little ?Help from another person to put on and taking off regular upper body clothing?: A Little ?Help from another person to put on and taking off regular lower body clothing?: A Little ?6 Click Score: 19 ?  ?End of Session Nurse Communication: Mobility status ? ?Activity Tolerance: Patient limited by pain ?Patient left: in chair;with call bell/phone within reach;with chair alarm set ? ?OT Visit Diagnosis: Unsteadiness on feet (R26.81);Pain;Muscle weakness (generalized) (M62.81) ?Pain - Right/Left: Left ?Pain - part of body: Shoulder  ?              ?Time: 6579-0383 ?OT Time Calculation (min): 16 min ?Charges:  OT General Charges ?$OT Visit: 1 Visit ?OT Evaluation ?$OT Eval Low Complexity: 1 Low ? ?Delbert Phenix OT ?OT pager: [phone] ? ?Rosemary Holms ?09/04/2021, 11:42 AM ?

## 2021-09-04 NOTE — Discharge Summary (Addendum)
Physician Discharge Summary  ?Trevor Bailey QQI:[id number] DOB: May 25, 1945 DOA: 09/01/2021 ? ?PCP: [name], MD ? ?Admit date: 09/01/2021 ?Discharge date: 09/04/2021 ? ?Admitted From: Home ?Disposition: Home ? ?Recommendations for Outpatient Follow-up:  ?Follow up with PCP in 1 week with repeat CBC/BMP ?Outpatient follow-up with IR/oncology and radiation oncology ?Follow up in ED if symptoms worsen or new appear ? ? ?Home Health: No ?Equipment/Devices: None ? ?Discharge Condition: Guarded ?CODE STATUS: Full ?Diet recommendation: Heart healthy ? 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 Protonix twice daily presented with GI bleeding and hypotension and was admitted for hemorrhagic shock/acute blood loss anemia due to GI bleed.  Hemoglobin was down to 7.2 (from 9.1 on 08/28/2021).  He was transfused 3 units of packed red cells.  GI recommended IR consultation for possible embolization.  IR evaluated the patient and recommended that embolization was not indicated at that time since H&H had stabilized and bleeding possibly stopped.  Diet was advanced.  He is tolerating diet.  Hemoglobin is currently stable.  He will be discharged home today with outpatient follow-up with IR/oncology and radiation oncology. ? ?Discharge Diagnoses:  ? ?Hemorrhagic shock/acute blood loss anemia due to upper GI bleeding ?-Patient was hypotensive to 60/40 per EMS.  Required 3 units packed red cells transfusion during this hospitalization. ?- Recent EGD on 08/26/21 with one large, deep, non-bleeding duodenal ulcer with pigmented material in duodenal bulb.  GI recommended IR involvement for possible empiric embolization. ?-IR evaluated the  patient and recommended that embolization was not indicated at that time since H&H had stabilized and bleeding possibly stopped.   ?-Diet was advanced.  He is tolerating diet.  Hemoglobin is currently stable, 11.2 today.  He will be discharged home today with outpatient follow-up with IR ?-Outpatient follow-up of hemoglobin. ? 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 ?-CT soft tissue neck redemonstrated large left supraclavicular and left posterior neck malignancy involving bones, muscles and lymph nodes with increased left> right pleural effusion.  ?-Continue pain management. ? ?Paroxysmal A-fib ?-Continue home metoprolol.  Off anticoagulation due to recent GI bleed ? ?Diabetes mellitus type 2 ?-Recent A1c 6.9.  Continue carb modified diet.  Resume metformin on discharge ? ?Essential hypertension ?-Blood pressure intermittently on the lower side.  Continue metoprolol.  Ramipril to remain on hold till reevaluation by PCP ? ?Hypomagnesemia ?-Replace prior to discharge ? ?Stage I medial sacral ulcer: Present on admission ?-Continue local wound care. ? ?OSA on CPAP ?-Continue CPAP at night ? ?CAD with history of remote stenting ?Hyperlipidemia ?-Continue statin and metoprolol.  Outpatient follow-up with cardiology ? ?Discharge Instructions ? ?Discharge Instructions   ? ? Ambulatory referral to Interventional Radiology   Complete by: As directed ?  ? Diet - low sodium heart healthy   Complete by: As directed ?  ? Increase activity slowly   Complete by: As directed ?  ? No wound care   Complete by: As directed ?  ? ?  ? ?Allergies as of 09/04/2021   ?No Known Allergies ?  ? ?  ?Medication List  ?  ? ?STOP taking these medications   ? ?gabapentin 300 MG capsule ?  Commonly known as: NEURONTIN ?  ?mirtazapine 30 MG tablet ?Commonly known as: REMERON ?  ?ramipril 10 MG capsule ?Commonly known as: ALTACE ?  ? ?  ? ?TAKE these  medications   ? ?Accu-Chek Guide test strip ?Generic drug: glucose blood ?  ?acetaminophen 500 MG tablet ?Commonly known as: TYLENOL ?Take 2 tablets (1,000 mg total) by mouth every 8 (eight) hours. ?  ?albuterol 108 (90 Base) MCG/ACT inhaler ?Commonly known as: VENTOLIN HFA ?Inhale 1 puff into the lungs daily as needed for wheezing. ?  ?atorvastatin 80 MG tablet ?Commonly known as: LIPITOR ?Take 80 mg by mouth daily. ?  ?budesonide-formoterol 160-4.5 MCG/ACT inhaler ?Commonly known as: SYMBICORT ?Inhale 2 puffs into the lungs 2 (two) times daily. ?  ?CareTouch 2 CPAP Hose [place] Misc ?See admin instructions. ?  ?ezetimibe 10 MG tablet ?Commonly known as: ZETIA ?Take 10 mg by mouth daily. ?  ?finasteride 5 MG tablet ?Commonly known as: PROSCAR ?Take 5 mg by mouth daily. ?  ?folic acid 1 MG tablet ?Commonly known as: FOLVITE ?TAKE 1 TABLET BY MOUTH  DAILY ?  ?latanoprost 0.005 % ophthalmic solution ?Commonly known as: XALATAN ?Place 1 drop into both eyes at bedtime. ?  ?metFORMIN 1000 MG tablet ?Commonly known as: GLUCOPHAGE ?Take 1,000 mg by mouth 2 (two) times daily. ?  ?metoprolol tartrate 25 MG tablet ?Commonly known as: LOPRESSOR ?Take 25 mg by mouth 2 (two) times daily. ?  ?multivitamin with minerals Tabs tablet ?Take 1 tablet by mouth daily. Centrum Silver ?  ?nitroGLYCERIN 0.4 MG SL tablet ?Commonly known as: NITROSTAT ?Place 0.4 mg under the tongue every 5 (five) minutes as needed for chest pain. ?  ?ondansetron 4 MG tablet ?Commonly known as: ZOFRAN ?Take 1 tablet (4 mg total) by mouth every 6 (six) hours as needed for nausea. ?  ?oxyCODONE 5 MG immediate release tablet ?Commonly known as: Oxy IR/ROXICODONE ?Take 1 tablet (5 mg total) by mouth every 6 (six) hours as needed for severe pain. ?  ?pantoprazole 40 MG tablet ?Commonly known as: PROTONIX ?Take 1 tablet (40 mg total) by mouth 2 (two) times daily before a meal. ?  ?Pharmacist Choice Lancets Misc ?  ?Systane 0.4-0.3 % Soln ?Generic drug: Polyethyl  Glycol-Propyl Glycol ?Place 1-2 drops into both eyes 3 (three) times daily as needed (dry/irritated eyes.). ?  ?traMADol 50 MG tablet ?Commonly known as: ULTRAM ?Take 1 tablet (50 mg total) by mouth every 12 (twelve) hours as needed for moderate pain or severe pain. ?  ? ?  ? ? Follow-up Information   ? ? [name], MD. Schedule an appointment as soon as possible for a visit in 1 week(s).   ?Specialty: Internal Medicine ?Contact information: ?[address] ?[place] [address] ?[phone] ? ? ?  ?  ? ? Troy Sine, MD .   ?Specialty: Cardiology ?Contact information: ?[address] ?[address] ?[place] [address] ?[phone] ? ? ?  ?  ? ? Curt Bears, MD. Schedule an appointment as soon as possible for a visit in 1 week(s).   ?Specialty: Oncology ?Contact information: ?[address] ?[place] [address] ?[phone] ? ? ?  ?  ? ?  ?  ? ?  ? ?No Known Allergies ? ?Consultations: ?IR ? ? ?Procedures/Studies: ?CT Head Wo Contrast ? ?Result Date: 08/25/2021 ?CLINICAL DATA:  Altered mental status EXAM: CT HEAD WITHOUT CONTRAST TECHNIQUE: Contiguous axial images were obtained from the base of the skull through the vertex  without intravenous contrast. RADIATION DOSE REDUCTION: This exam was performed according to the departmental dose-optimization program which includes automated exposure control, adjustment of the mA and/or kV according to patient size and/or use of iterative reconstruction technique. COMPARISON:  08/10/2009 FINDINGS: Brain: No evidence of acute infarction, hemorrhage, hydrocephalus, extra-axial collection or mass lesion/mass effect. Chronic white matter ischemic changes are noted. Vascular: No hyperdense vessel or unexpected calcification. Skull: Normal. Negative for fracture or focal lesion. Sinuses/Orbits: No acute finding. Other: None. IMPRESSION: Chronic white matter ischemic changes without acute intracranial abnormality. Electronically Signed   By:  Inez Catalina M.D.   On: 08/25/2021 02:03  ? ?CT SOFT TISSUE NECK WO CONTRAST ? ?Result Date: 09/02/2021 ?CLINICAL DATA:  Pain in the left neck extending to the shoulder, area of radiation changes and exud

## 2021-09-04 NOTE — TOC Progression Note (Signed)
Transition of Care (TOC) - Progression Note  ? ? ?Patient Details  ?Name: Trevor Bailey ?MRN: [id number] ?Date of Birth: 1946-02-24 ? ?Transition of Care (TOC) CM/SW Contact  ?Purcell Mouton, RN ?Phone Number: ?09/04/2021, 11:17 AM ? ?Clinical Narrative:    ? ? ?Transition of Care (TOC) Screening Note ? ? ?Patient Details  ?Name: Trevor Bailey ?Date of Birth: [birth year] ? ? ?Transition of Care (TOC) CM/SW Contact:    ?Purcell Mouton, RN ?Phone Number: ?09/04/2021, 11:17 AM ? ? ? ?Transition of Care Department [facility]) has reviewed patient and no TOC needs have been identified at this time. We will continue to monitor patient advancement through interdisciplinary progression rounds. If new patient transition needs arise, please place a TOC consult. ?  ? ?  ?  ? ?Expected Discharge Plan and Services ?  ?  ?  ?  ?  ?Expected Discharge Date: 09/04/21               ?  ?  ?  ?  ?  ?  ?  ?  ?  ?  ? ? ?Social Determinants of Health (SDOH) Interventions ?  ? ?Readmission Risk Interventions ?   ? View : No data to display.  ?  ?  ?  ? ? ?

## 2021-09-05 LAB — TYPE AND SCREEN
ABO/RH(D): A POS
Antibody Screen: NEGATIVE
Unit division: 0
Unit division: 0
Unit division: 0
Unit division: 0
Unit division: 0

## 2021-09-05 LAB — BPAM RBC
Blood Product Expiration Date: 202304262359
Blood Product Expiration Date: 202304262359
Blood Product Expiration Date: 202305062359
Blood Product Expiration Date: 202305172359
Blood Product Expiration Date: 202305192359
ISSUE DATE / TIME: 202304161914
ISSUE DATE / TIME: 202304161914
ISSUE DATE / TIME: 202304170251
ISSUE DATE / TIME: 202304190917
Unit Type and Rh: 6200
Unit Type and Rh: 6200
Unit Type and Rh: 6200
Unit Type and Rh: 9500
Unit Type and Rh: 9500

## 2021-09-09 DIAGNOSIS — I959 Hypotension, unspecified: Secondary | ICD-10-CM | POA: Diagnosis not present

## 2021-09-09 DIAGNOSIS — K922 Gastrointestinal hemorrhage, unspecified: Secondary | ICD-10-CM | POA: Diagnosis not present

## 2021-09-09 DIAGNOSIS — K269 Duodenal ulcer, unspecified as acute or chronic, without hemorrhage or perforation: Secondary | ICD-10-CM | POA: Diagnosis not present

## 2021-09-10 ENCOUNTER — Other Ambulatory Visit: Payer: Self-pay

## 2021-09-10 ENCOUNTER — Encounter (HOSPITAL_COMMUNITY): Payer: Self-pay

## 2021-09-10 ENCOUNTER — Emergency Department (HOSPITAL_COMMUNITY): Payer: Medicare Other

## 2021-09-10 ENCOUNTER — Inpatient Hospital Stay (HOSPITAL_COMMUNITY)
Admission: EM | Admit: 2021-09-10 | Discharge: 2021-09-14 | DRG: 357 | Disposition: A | Discharge status: Hospice | Payer: Medicare Other | Attending: Internal Medicine | Admitting: Internal Medicine

## 2021-09-10 DIAGNOSIS — K922 Gastrointestinal hemorrhage, unspecified: Principal | ICD-10-CM | POA: Diagnosis present

## 2021-09-10 DIAGNOSIS — E871 Hypo-osmolality and hyponatremia: Secondary | ICD-10-CM | POA: Diagnosis not present

## 2021-09-10 DIAGNOSIS — E1165 Type 2 diabetes mellitus with hyperglycemia: Secondary | ICD-10-CM | POA: Diagnosis present

## 2021-09-10 DIAGNOSIS — E8809 Other disorders of plasma-protein metabolism, not elsewhere classified: Secondary | ICD-10-CM

## 2021-09-10 DIAGNOSIS — I10 Essential (primary) hypertension: Secondary | ICD-10-CM | POA: Diagnosis present

## 2021-09-10 DIAGNOSIS — E119 Type 2 diabetes mellitus without complications: Secondary | ICD-10-CM | POA: Diagnosis not present

## 2021-09-10 DIAGNOSIS — Z955 Presence of coronary angioplasty implant and graft: Secondary | ICD-10-CM

## 2021-09-10 DIAGNOSIS — Z9221 Personal history of antineoplastic chemotherapy: Secondary | ICD-10-CM

## 2021-09-10 DIAGNOSIS — Z83438 Family history of other disorder of lipoprotein metabolism and other lipidemia: Secondary | ICD-10-CM

## 2021-09-10 DIAGNOSIS — K264 Chronic or unspecified duodenal ulcer with hemorrhage: Secondary | ICD-10-CM | POA: Diagnosis not present

## 2021-09-10 DIAGNOSIS — Z87891 Personal history of nicotine dependence: Secondary | ICD-10-CM

## 2021-09-10 DIAGNOSIS — K319 Disease of stomach and duodenum, unspecified: Secondary | ICD-10-CM | POA: Diagnosis not present

## 2021-09-10 DIAGNOSIS — Z7984 Long term (current) use of oral hypoglycemic drugs: Secondary | ICD-10-CM

## 2021-09-10 DIAGNOSIS — E785 Hyperlipidemia, unspecified: Secondary | ICD-10-CM | POA: Diagnosis present

## 2021-09-10 DIAGNOSIS — M25519 Pain in unspecified shoulder: Secondary | ICD-10-CM | POA: Diagnosis not present

## 2021-09-10 DIAGNOSIS — Z7951 Long term (current) use of inhaled steroids: Secondary | ICD-10-CM

## 2021-09-10 DIAGNOSIS — D75839 Thrombocytosis, unspecified: Secondary | ICD-10-CM

## 2021-09-10 DIAGNOSIS — R079 Chest pain, unspecified: Secondary | ICD-10-CM | POA: Diagnosis not present

## 2021-09-10 DIAGNOSIS — Z9861 Coronary angioplasty status: Secondary | ICD-10-CM

## 2021-09-10 DIAGNOSIS — J449 Chronic obstructive pulmonary disease, unspecified: Secondary | ICD-10-CM | POA: Diagnosis present

## 2021-09-10 DIAGNOSIS — D649 Anemia, unspecified: Secondary | ICD-10-CM | POA: Diagnosis not present

## 2021-09-10 DIAGNOSIS — Z7189 Other specified counseling: Secondary | ICD-10-CM | POA: Diagnosis not present

## 2021-09-10 DIAGNOSIS — C3412 Malignant neoplasm of upper lobe, left bronchus or lung: Secondary | ICD-10-CM | POA: Diagnosis not present

## 2021-09-10 DIAGNOSIS — F22 Delusional disorders: Secondary | ICD-10-CM | POA: Diagnosis present

## 2021-09-10 DIAGNOSIS — Z5329 Procedure and treatment not carried out because of patient's decision for other reasons: Secondary | ICD-10-CM | POA: Diagnosis present

## 2021-09-10 DIAGNOSIS — Z803 Family history of malignant neoplasm of breast: Secondary | ICD-10-CM

## 2021-09-10 DIAGNOSIS — Z515 Encounter for palliative care: Secondary | ICD-10-CM

## 2021-09-10 DIAGNOSIS — I480 Paroxysmal atrial fibrillation: Secondary | ICD-10-CM | POA: Diagnosis not present

## 2021-09-10 DIAGNOSIS — Z7901 Long term (current) use of anticoagulants: Secondary | ICD-10-CM

## 2021-09-10 DIAGNOSIS — I2510 Atherosclerotic heart disease of native coronary artery without angina pectoris: Secondary | ICD-10-CM

## 2021-09-10 DIAGNOSIS — D62 Acute posthemorrhagic anemia: Secondary | ICD-10-CM

## 2021-09-10 DIAGNOSIS — C778 Secondary and unspecified malignant neoplasm of lymph nodes of multiple regions: Secondary | ICD-10-CM | POA: Diagnosis present

## 2021-09-10 DIAGNOSIS — I252 Old myocardial infarction: Secondary | ICD-10-CM

## 2021-09-10 DIAGNOSIS — Z79899 Other long term (current) drug therapy: Secondary | ICD-10-CM | POA: Diagnosis not present

## 2021-09-10 DIAGNOSIS — Z833 Family history of diabetes mellitus: Secondary | ICD-10-CM

## 2021-09-10 DIAGNOSIS — J90 Pleural effusion, not elsewhere classified: Secondary | ICD-10-CM | POA: Diagnosis not present

## 2021-09-10 DIAGNOSIS — K921 Melena: Secondary | ICD-10-CM | POA: Diagnosis not present

## 2021-09-10 DIAGNOSIS — R410 Disorientation, unspecified: Secondary | ICD-10-CM | POA: Diagnosis not present

## 2021-09-10 DIAGNOSIS — G893 Neoplasm related pain (acute) (chronic): Secondary | ICD-10-CM | POA: Diagnosis not present

## 2021-09-10 DIAGNOSIS — Z9989 Dependence on other enabling machines and devices: Secondary | ICD-10-CM

## 2021-09-10 DIAGNOSIS — N179 Acute kidney failure, unspecified: Secondary | ICD-10-CM | POA: Diagnosis not present

## 2021-09-10 DIAGNOSIS — K269 Duodenal ulcer, unspecified as acute or chronic, without hemorrhage or perforation: Secondary | ICD-10-CM | POA: Diagnosis not present

## 2021-09-10 DIAGNOSIS — Z66 Do not resuscitate: Secondary | ICD-10-CM

## 2021-09-10 DIAGNOSIS — Z8249 Family history of ischemic heart disease and other diseases of the circulatory system: Secondary | ICD-10-CM

## 2021-09-10 DIAGNOSIS — R000 Tachycardia, unspecified: Secondary | ICD-10-CM | POA: Diagnosis not present

## 2021-09-10 DIAGNOSIS — Z923 Personal history of irradiation: Secondary | ICD-10-CM

## 2021-09-10 DIAGNOSIS — Z85118 Personal history of other malignant neoplasm of bronchus and lung: Secondary | ICD-10-CM

## 2021-09-10 DIAGNOSIS — E861 Hypovolemia: Secondary | ICD-10-CM | POA: Diagnosis present

## 2021-09-10 DIAGNOSIS — C7951 Secondary malignant neoplasm of bone: Secondary | ICD-10-CM | POA: Diagnosis not present

## 2021-09-10 DIAGNOSIS — G4733 Obstructive sleep apnea (adult) (pediatric): Secondary | ICD-10-CM | POA: Diagnosis not present

## 2021-09-10 DIAGNOSIS — Z823 Family history of stroke: Secondary | ICD-10-CM

## 2021-09-10 LAB — PROTIME-INR
INR: 1.1 (ref 0.8–1.2)
Prothrombin Time: 13.8 seconds (ref 11.4–15.2)

## 2021-09-10 LAB — CBC WITH DIFFERENTIAL/PLATELET
Abs Immature Granulocytes: 0.05 10*3/uL (ref 0.00–0.07)
Basophils Absolute: 0 10*3/uL (ref 0.0–0.1)
Basophils Relative: 0 %
Eosinophils Absolute: 0 10*3/uL (ref 0.0–0.5)
Eosinophils Relative: 0 %
HCT: 22.5 % — ABNORMAL LOW (ref 39.0–52.0)
Hemoglobin: 7 g/dL — ABNORMAL LOW (ref 13.0–17.0)
Immature Granulocytes: 1 %
Lymphocytes Relative: 8 %
Lymphs Abs: 0.8 10*3/uL (ref 0.7–4.0)
MCH: 28.9 pg (ref 26.0–34.0)
MCHC: 31.1 g/dL (ref 30.0–36.0)
MCV: 93 fL (ref 80.0–100.0)
Monocytes Absolute: 0.6 10*3/uL (ref 0.1–1.0)
Monocytes Relative: 6 %
Neutro Abs: 8.2 10*3/uL — ABNORMAL HIGH (ref 1.7–7.7)
Neutrophils Relative %: 85 %
Platelets: 604 10*3/uL — ABNORMAL HIGH (ref 150–400)
RBC: 2.42 MIL/uL — ABNORMAL LOW (ref 4.22–5.81)
RDW: 16.3 % — ABNORMAL HIGH (ref 11.5–15.5)
WBC: 9.7 10*3/uL (ref 4.0–10.5)
nRBC: 0 % (ref 0.0–0.2)

## 2021-09-10 LAB — CBC
HCT: 16.7 % — ABNORMAL LOW (ref 39.0–52.0)
Hemoglobin: 5.3 g/dL — CL (ref 13.0–17.0)
MCH: 29.6 pg (ref 26.0–34.0)
MCHC: 31.7 g/dL (ref 30.0–36.0)
MCV: 93.3 fL (ref 80.0–100.0)
Platelets: 606 10*3/uL — ABNORMAL HIGH (ref 150–400)
RBC: 1.79 MIL/uL — ABNORMAL LOW (ref 4.22–5.81)
RDW: 16.4 % — ABNORMAL HIGH (ref 11.5–15.5)
WBC: 8.3 10*3/uL (ref 4.0–10.5)
nRBC: 0 % (ref 0.0–0.2)

## 2021-09-10 LAB — LIPASE, BLOOD: Lipase: 27 U/L (ref 11–51)

## 2021-09-10 LAB — GLUCOSE, CAPILLARY
Glucose-Capillary: 128 mg/dL — ABNORMAL HIGH (ref 70–99)
Glucose-Capillary: 155 mg/dL — ABNORMAL HIGH (ref 70–99)
Glucose-Capillary: 127 mg/dL — ABNORMAL HIGH (ref 70–99)

## 2021-09-10 LAB — COMPREHENSIVE METABOLIC PANEL
ALT: 17 U/L (ref 0–44)
AST: 21 U/L (ref 15–41)
Albumin: 1.8 g/dL — ABNORMAL LOW (ref 3.5–5.0)
Alkaline Phosphatase: 96 U/L (ref 38–126)
Anion gap: 11 (ref 5–15)
BUN: 49 mg/dL — ABNORMAL HIGH (ref 8–23)
CO2: 20 mmol/L — ABNORMAL LOW (ref 22–32)
Calcium: 8.6 mg/dL — ABNORMAL LOW (ref 8.9–10.3)
Chloride: 103 mmol/L (ref 98–111)
Creatinine, Ser: 1.68 mg/dL — ABNORMAL HIGH (ref 0.61–1.24)
GFR, Estimated: 42 mL/min — ABNORMAL LOW (ref >60)
Glucose, Bld: 196 mg/dL — ABNORMAL HIGH (ref 70–99)
Potassium: 5.1 mmol/L (ref 3.5–5.1)
Sodium: 134 mmol/L — ABNORMAL LOW (ref 135–145)
Total Bilirubin: 0.5 mg/dL (ref 0.3–1.2)
Total Protein: 5.5 g/dL — ABNORMAL LOW (ref 6.5–8.1)

## 2021-09-10 LAB — HEMOGLOBIN AND HEMATOCRIT, BLOOD
HCT: 26.1 % — ABNORMAL LOW (ref 39.0–52.0)
Hemoglobin: 8.6 g/dL — ABNORMAL LOW (ref 13.0–17.0)

## 2021-09-10 LAB — PREPARE RBC (CROSSMATCH)

## 2021-09-10 MED ORDER — ACETAMINOPHEN 500 MG PO TABS
1000.0000 mg | ORAL_TABLET | Freq: Two times a day (BID) | ORAL | Status: DC
Start: 2021-09-10 — End: 2021-09-12
  Administered 2021-09-10 – 2021-09-11 (×3): 1000 mg via ORAL
  Filled 2021-09-10 (×3): qty 2

## 2021-09-10 MED ORDER — MOMETASONE FURO-FORMOTEROL FUM 200-5 MCG/ACT IN AERO
2.0000 | INHALATION_SPRAY | Freq: Two times a day (BID) | RESPIRATORY_TRACT | Status: DC
  Administered 2021-09-11 – 2021-09-12 (×3): 2 via RESPIRATORY_TRACT
  Filled 2021-09-10: qty 8.8

## 2021-09-10 MED ORDER — INSULIN ASPART 100 UNIT/ML IJ SOLN
0.0000 [IU] | Freq: Every day | INTRAMUSCULAR | Status: DC
  Administered 2021-09-10: 1 [IU] via SUBCUTANEOUS

## 2021-09-10 MED ORDER — SODIUM CHLORIDE 0.9 % IV SOLN: INTRAVENOUS | Status: DC

## 2021-09-10 MED ORDER — INSULIN ASPART 100 UNIT/ML IJ SOLN
0.0000 [IU] | Freq: Three times a day (TID) | INTRAMUSCULAR | Status: DC
  Administered 2021-09-10: 2 [IU] via SUBCUTANEOUS
  Administered 2021-09-10: 3 [IU] via SUBCUTANEOUS
  Administered 2021-09-11: 2 [IU] via SUBCUTANEOUS
  Administered 2021-09-11: 3 [IU] via SUBCUTANEOUS
  Administered 2021-09-12: 2 [IU] via SUBCUTANEOUS

## 2021-09-10 MED ORDER — ONDANSETRON HCL 4 MG/2ML IJ SOLN
4.0000 mg | Freq: Four times a day (QID) | INTRAMUSCULAR | Status: DC | PRN
Start: 2021-09-10 — End: 2021-09-14
  Administered 2021-09-11: 4 mg via INTRAVENOUS
  Filled 2021-09-10: qty 2

## 2021-09-10 MED ORDER — ORAL CARE MOUTH RINSE
15.0000 mL | Freq: Two times a day (BID) | OROMUCOSAL | Status: DC
  Administered 2021-09-10 – 2021-09-14 (×6): 15 mL via OROMUCOSAL

## 2021-09-10 MED ORDER — MORPHINE SULFATE (PF) 2 MG/ML IV SOLN
2.0000 mg | INTRAVENOUS | Status: DC | PRN
  Administered 2021-09-10 – 2021-09-11 (×12): 2 mg via INTRAVENOUS
  Filled 2021-09-10 (×12): qty 1

## 2021-09-10 MED ORDER — ONDANSETRON HCL 4 MG PO TABS
4.0000 mg | ORAL_TABLET | Freq: Four times a day (QID) | ORAL | Status: DC | PRN
Start: 2021-09-10 — End: 2021-09-14

## 2021-09-10 MED ORDER — FENTANYL CITRATE PF 50 MCG/ML IJ SOSY
50.0000 ug | PREFILLED_SYRINGE | Freq: Once | INTRAMUSCULAR | Status: AC
  Administered 2021-09-10: 50 ug via INTRAVENOUS
  Filled 2021-09-10: qty 1

## 2021-09-10 MED ORDER — PANTOPRAZOLE SODIUM 40 MG IV SOLR: 40.0000 mg | Freq: Two times a day (BID) | INTRAVENOUS | Status: DC

## 2021-09-10 MED ORDER — HYDROCODONE-ACETAMINOPHEN 5-325 MG PO TABS
1.0000 | ORAL_TABLET | Freq: Four times a day (QID) | ORAL | Status: DC | PRN
  Administered 2021-09-10: 1 via ORAL
  Filled 2021-09-10: qty 1

## 2021-09-10 MED ORDER — SODIUM CHLORIDE 0.9 % IV BOLUS
500.0000 mL | Freq: Once | INTRAVENOUS | Status: AC
  Administered 2021-09-10: 500 mL via INTRAVENOUS

## 2021-09-10 MED ORDER — PANTOPRAZOLE INFUSION (NEW) - SIMPLE MED
8.0000 mg/h | INTRAVENOUS | Status: DC
  Administered 2021-09-10 – 2021-09-12 (×6): 8 mg/h via INTRAVENOUS
  Filled 2021-09-10 (×3): qty 80
  Filled 2021-09-10: qty 100
  Filled 2021-09-10 (×3): qty 80

## 2021-09-10 MED ORDER — IPRATROPIUM-ALBUTEROL 0.5-2.5 (3) MG/3ML IN SOLN: 3.0000 mL | RESPIRATORY_TRACT | Status: DC | PRN

## 2021-09-10 MED ORDER — PANTOPRAZOLE 80MG IVPB - SIMPLE MED
80.0000 mg | Freq: Once | INTRAVENOUS | Status: AC
  Administered 2021-09-10: 80 mg via INTRAVENOUS
  Filled 2021-09-10: qty 80

## 2021-09-10 MED ORDER — ATORVASTATIN CALCIUM 40 MG PO TABS
80.0000 mg | ORAL_TABLET | Freq: Every day | ORAL | Status: DC
  Administered 2021-09-10 – 2021-09-11 (×2): 80 mg via ORAL
  Filled 2021-09-10 (×2): qty 2

## 2021-09-10 MED ORDER — SODIUM CHLORIDE 0.9% IV SOLUTION: Freq: Once | INTRAVENOUS | Status: AC

## 2021-09-10 MED ORDER — CHLORHEXIDINE GLUCONATE CLOTH 2 % EX PADS
6.0000 | MEDICATED_PAD | Freq: Every day | CUTANEOUS | Status: DC
  Administered 2021-09-10 – 2021-09-14 (×3): 6 via TOPICAL

## 2021-09-10 NOTE — Progress Notes (Signed)
Patient ID: Trevor Bailey, male   DOB: 03-29-1946, 76 y.o.   MRN: [id number] ? ? ? ?Referring Physician(s): ?Alekh,K ? ?Supervising Physician: Markus Daft ? ?Patient Status:  [facility] - In-pt ? ?Chief Complaint: ?Neck pain, epigastric pain, recent GI bleeding, duodenal ulcer ? ?Subjective: ?Patient known to IR service from right adrenal gland nodule biopsy on 01/28/2019.  He has a past medical history significant for coronary artery disease with prior stent, diabetes, obstructive sleep apnea, atrial fibrillation on Eliquis, stage IV non-small cell lung cancer with left neck/shoulder mets status post chemoradiation and recent hospitalization from 4/8-4/20/23 secondary to GI bleed.  He was found to have a large duodenal ulcer for which he was treated with 4 units of blood and discharged on p.o. Protonix twice daily.  The vessels were not amenable to endoscopic therapy.  He was evaluated by our service on 4/17 and it was determined that in the absence of confirmed site for active bleed and in light of atherosclerotic disease and renal insufficiency image guided intervention was not indicated at the time.  He presented to [facility] ED again earlier today with neck pain, shoulder pain, epigastric discomfort but no frank hematochezia or melena.  Last known episode of melena was 2 days ago.  Current labs include WBC 9.7, hemoglobin 7 down from 11.2, platelets 604k, creatinine 1.68 up from 0.85, PT/INR pend.  Patient has again been seen by GI team and they are planning EGD tentatively for 4/26.  Packed red blood cells ordered.  Request again received from GI/TRH service to consider possible angio with embolization. ?Patient currently denies fever, headache, chest pain, dyspnea, cough, nausea, vomiting. ? ? ?Past Medical History:  ?Diagnosis Date  ? Adenopathy   ? left supraclavicular lymph node  ? Allergies   ? COPD (chronic obstructive pulmonary disease) ([place])   ? Coronary disease   ? Diabetes ([place])   ? Enlarged prostate   ? GERD  (gastroesophageal reflux disease)   ? Glaucoma   ? Hyperlipidemia   ? Hypertension   ? Myocardial infarction [facility])   ? nscl ca dx'd 12/2018  ? Sleep apnea   ? wears CPAP  ? Wears partial dentures   ? upper and lower  ? ?Past Surgical History:  ?Procedure Laterality Date  ? ABDOMINAL AORTAGRAM Left 09/16/2013  ? Procedure: ABDOMINAL AORTAGRAM;  Surgeon: Elam Dutch, MD;  Location: [facility] CATH LAB;  Service: Cardiovascular;  Laterality: Left;  ? CARDIAC CATHETERIZATION  01/2011  ? When there was segmental stenosis of the distal RCA, patent PCA stent, patent circumflex stent, and a patent small diagonal with 90% ISR.   ? COLONOSCOPY W/ BIOPSIES AND POLYPECTOMY    ? CORONARY ANGIOPLASTY  may 2002  ? Non-DES stenting of his circumflex, non-DES stenting of the RCA  ? ESOPHAGOGASTRODUODENOSCOPY (EGD) WITH PROPOFOL N/A 08/26/2021  ? Procedure: ESOPHAGOGASTRODUODENOSCOPY (EGD) WITH PROPOFOL;  Surgeon: Arta Silence, MD;  Location: [place];  Service: Gastroenterology;  Laterality: N/A;  ? HYDROCELE EXCISION / REPAIR  2009  ? by Dr Janice Norrie  ? INGUINAL HERNIA REPAIR Bilateral   ? Dr Bubba Camp  ? LYMPH NODE BIOPSY Left 01/10/2019  ? Procedure: left supraclavicular LYMPH NODE BIOPSY;  Surgeon: [name], MD;  Location: [place];  Service: Thoracic;  Laterality: Left;  ? MULTIPLE TOOTH EXTRACTIONS    ? NM MYOCAR PERF WALL MOTION  01/08/2011  ? moderate in size and intensity area of reversible ischemia in the basal to mid inferior and septal territories. Abnormal  study  ? stents  2008  ? proximal RCA, DES for progession of disease.  ? US ECHOCARDIOGRAPHY  01/12/2012  ? mild concentric LVH, borderline LA enlargement, mild to mod TR  ? ? ? ?Allergies: ?Patient has no known allergies. ? ?Medications: ?Prior to Admission medications   ?Medication Sig Start Date End Date Taking? Authorizing Provider  ?ACCU-CHEK GUIDE test strip  11/09/19   [provider]  ?acetaminophen (TYLENOL) 500 MG tablet Take 2 tablets (1,000 mg total)  by mouth every 8 (eight) hours. 09/04/21   Aline August, MD  ?albuterol (VENTOLIN HFA) 108 (90 Base) MCG/ACT inhaler Inhale 1 puff into the lungs daily as needed for wheezing. 05/10/20   [provider]  ?atorvastatin (LIPITOR) 80 MG tablet Take 80 mg by mouth daily.    [provider]  ?budesonide-formoterol (SYMBICORT) 160-4.5 MCG/ACT inhaler Inhale 2 puffs into the lungs 2 (two) times daily. ?Patient not taking: Reported on 09/01/2021    [name], MD  ?ezetimibe (ZETIA) 10 MG tablet Take 10 mg by mouth daily.    [provider]  ?finasteride (PROSCAR) 5 MG tablet Take 5 mg by mouth daily.  ?Patient not taking: Reported on 09/01/2021 02/18/16   [provider]  ?folic acid (FOLVITE) 1 MG tablet TAKE 1 TABLET BY MOUTH  DAILY ?Patient taking differently: Take 1 mg by mouth daily. 10/24/20   Curt Bears, MD  ?latanoprost (XALATAN) 0.005 % ophthalmic solution Place 1 drop into both eyes at bedtime.  08/09/18   [provider]  ?metFORMIN (GLUCOPHAGE) 1000 MG tablet Take 1,000 mg by mouth 2 (two) times daily.  05/25/13   [provider]  ?metoprolol tartrate (LOPRESSOR) 25 MG tablet Take 25 mg by mouth 2 (two) times daily.    [provider]  ?Multiple Vitamin (MULTIVITAMIN WITH MINERALS) TABS tablet Take 1 tablet by mouth daily. Centrum Silver    [provider]  ?nitroGLYCERIN (NITROSTAT) 0.4 MG SL tablet Place 0.4 mg under the tongue every 5 (five) minutes as needed for chest pain. ?Patient not taking: Reported on 09/01/2021    [provider]  ?ondansetron (ZOFRAN) 4 MG tablet Take 1 tablet (4 mg total) by mouth every 6 (six) hours as needed for nausea. 09/04/21   Aline August, MD  ?oxyCODONE (OXY IR/ROXICODONE) 5 MG immediate release tablet Take 1 tablet (5 mg total) by mouth every 6 (six) hours as needed for severe pain. 09/04/21   Aline August, MD  ?pantoprazole (PROTONIX) 40 MG tablet Take 1 tablet (40 mg total) by mouth 2  (two) times daily before a meal. 08/29/21   Thurnell Lose, MD  ?Pharmacist Choice Lancets [place]  07/30/20   [provider]  ?Polyethyl Glycol-Propyl Glycol (SYSTANE) 0.4-0.3 % SOLN Place 1-2 drops into both eyes 3 (three) times daily as needed (dry/irritated eyes.).    [provider]  ?Respiratory Therapy Supplies (CARETOUCH 2 CPAP HOSE HANGER) MISC See admin instructions. 07/29/18   [provider]  ?traMADol (ULTRAM) 50 MG tablet Take 1 tablet (50 mg total) by mouth every 12 (twelve) hours as needed for moderate pain or severe pain. 08/29/21   Thurnell Lose, MD  ? ? ? ?Vital Signs: ?BP 100/64   Pulse 95   Temp 98 ?F (36.7 ?C) (Oral)   Resp 13   Ht 5\' 11"  (1.803 m)   Wt 160 lb (72.6 kg)   SpO2 96%   BMI 22.32 kg/m?  ? ?Physical Exam patient awake, alert.  Chest  clear to auscultation bilaterally.  Heart with nl rate, irreg rhythm; abd soft,+BS, some mild gen tenderness to palpation; no LE edema ? ?Imaging: ?DG Chest Port 1 View ? ?Result Date: 09/10/2021 ?CLINICAL DATA:  GI bleeding and chest pain EXAM: PORTABLE CHEST 1 VIEW COMPARISON:  09/01/2021 FINDINGS: Hazy appearance of the left chest attributed to pleural fluid based on chest CT last month. Post treatment volume loss and opacity at the left apex. Clear right lung. Normal heart size and mediastinal contours. IMPRESSION: Small left pleural effusion similar to CT from last month. Electronically Signed   By: Jorje Guild M.D.   On: 09/10/2021 05:40   ? ?Labs: ? ?CBC: ?Recent Labs  ?  09/02/21 ?1405 09/03/21 ?0250 09/04/21 ?0321 09/10/21 ?0330  ?WBC 9.1 7.1 6.7 9.7  ?HGB 11.6* 11.3* 11.2* 7.0*  ?HCT 34.6* 33.9* 34.6* 22.5*  ?PLT 417* 433* 470* 604*  ? ? ?COAGS: ?No results for input(s): INR, APTT in the last 8760 hours. ? ?BMP: ?Recent Labs  ?  09/02/21 ?0759 09/03/21 ?0250 09/04/21 ?0321 09/10/21 ?0330  ?NA 137 137 135 134*  ?K 4.7 4.2 4.3 5.1  ?CL 109 108 104 103  ?CO2 21* 22 24 20*  ?GLUCOSE 106* 105* 98 196*  ?BUN 45*  28* 17 49*  ?CALCIUM 8.7* 8.7* 8.6* 8.6*  ?CREATININE 1.45* 0.99 0.85 1.68*  ?GFRNONAA 50* >60 >60 42*  ? ? ?LIVER FUNCTION TESTS: ?Recent Labs  ?  08/28/21 ?0100 08/29/21 ?0132 09/01/21 ?1915 09/03/21 ?7622

## 2021-09-10 NOTE — Progress Notes (Signed)
?  Transition of Care (TOC) Screening Note ? ? ?Patient Details  ?Name: Trevor Bailey ?Date of Birth: 03/24/1946 ? ? ?Transition of Care (TOC) CM/SW Contact:    ?Madex Seals, LCSW ?Phone Number: ?09/10/2021, 11:40 AM ? ? ? ?Transition of Care Department [facility]) has reviewed patient and no TOC needs have been identified at this time. We will continue to monitor patient advancement through interdisciplinary progression rounds. If new patient transition needs arise, please place a TOC consult. ? ? ?

## 2021-09-10 NOTE — Plan of Care (Signed)
  Problem: Education: Goal: Knowledge of General Education information will improve Description: Including pain rating scale, medication(s)/side effects and non-pharmacologic comfort measures Outcome: Progressing   Problem: Clinical Measurements: Goal: Ability to maintain clinical measurements within normal limits will improve Outcome: Progressing   

## 2021-09-10 NOTE — ED Notes (Signed)
Per Beth@ [employer]- units are ready for pick-up. RN advised. ENMiles ?

## 2021-09-10 NOTE — H&P (Signed)
?History and Physical  ? ? ?Trevor Bailey FYB:[id number] DOB: 1945-06-26 DOA: 09/10/2021 ? ?PCP: [name], MD  ? ?Patient coming from: Home ? ?I have personally briefly reviewed patient's old medical records in [place] ? ?Chief Complaint: Weakness and left shoulder/neck pain ? ?HPI: Trevor Bailey is a 76 y.o. 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 Protonix twice daily followed by ?Another hospitalization from 09/01/2021-09/04/2021 for hemorrhagic shock/acute blood loss anemia from GI bleeding requiring 3 units packed red cells transfusion for which GI recommended IR evaluation but by the time IR evaluated patient had stabilized with stabilized hemoglobin and patient was discharged after hemoglobin remained stable and patient tolerated diet.  Patient presented today with weakness and left shoulder/neck pain.  Patient has been dealing with progressively worsening left neck/shoulder pain for the last month and is on pain medications and is being followed by oncology and radiation oncology for the same.  He also complains of worsening generalized weakness but denies any bloody stools.  He had an episode of black stools 2 days ago.  He has had some intermittent abdominal pain but no nausea, vomiting or diarrhea.  Has intermittent cough and shortness of breath which has not changed.  Denies any fever but feels cold.  No loss of consciousness, seizures, dysuria or increased frequency of urination. ? ?ED Course: Patient was apparently tachycardic and hypotensive for EMS.  Blood pressure improved in the ED without intervention.  Hemoglobin was 7, hemoglobin 11.2 on discharge on 09/04/2021.   Creatinine 1.68, 0.85 on 09/04/2021.  2 units packed red cells transfusion was ordered by ED provider.  GI was consulted by ED provider. ?Hospitalist service was called to evaluate the patient. ? ?Review of Systems: As per HPI otherwise all other systems were reviewed and are negative. ? ? ?Past Medical History:  ?Diagnosis Date  ? Adenopathy   ? left supraclavicular lymph node  ? Allergies   ? COPD (chronic obstructive pulmonary disease) ([place])   ? Coronary disease   ? Diabetes ([place])   ? Enlarged prostate   ? GERD (gastroesophageal reflux disease)   ? Glaucoma   ? Hyperlipidemia   ? Hypertension   ? Myocardial infarction [facility])   ? nscl ca dx'd 12/2018  ? Sleep apnea   ? wears CPAP  ? Wears partial dentures   ? upper and lower  ? ? ?Past Surgical History:  ?Procedure Laterality Date  ? ABDOMINAL AORTAGRAM Left 09/16/2013  ? Procedure: ABDOMINAL AORTAGRAM;  Surgeon: Elam Dutch, MD;  Location: [facility] CATH LAB;  Service: Cardiovascular;  Laterality: Left;  ? CARDIAC CATHETERIZATION  01/2011  ? When there was segmental stenosis of the distal RCA, patent PCA stent, patent circumflex stent, and a patent small diagonal with 90% ISR.   ? COLONOSCOPY W/ BIOPSIES AND POLYPECTOMY    ? CORONARY ANGIOPLASTY  may 2002  ? Non-DES stenting of his circumflex, non-DES stenting of the RCA  ? ESOPHAGOGASTRODUODENOSCOPY (EGD) WITH PROPOFOL N/A 08/26/2021  ? Procedure: ESOPHAGOGASTRODUODENOSCOPY (EGD) WITH PROPOFOL;  Surgeon: Arta Silence, MD;  Location: [place];  Service: Gastroenterology;  Laterality: N/A;  ? HYDROCELE EXCISION / REPAIR  2009  ? by Dr Janice Norrie  ? INGUINAL HERNIA REPAIR Bilateral   ?  Dr Bubba Camp  ? LYMPH NODE BIOPSY Left 01/10/2019  ? Procedure: left supraclavicular LYMPH NODE BIOPSY;  Surgeon: [name], MD;  Location: [facility];  Service: Thoracic;  Laterality: Left;  ? MULTIPLE TOOTH EXTRACTIONS    ? NM MYOCAR PERF WALL MOTION  01/08/2011  ? moderate in size and intensity area of reversible ischemia in the basal  to mid inferior and septal territories. Abnormal study  ? stents  2008  ? proximal RCA, DES for progession of disease.  ? US ECHOCARDIOGRAPHY  01/12/2012  ? mild concentric LVH, borderline LA enlargement, mild to mod TR  ? ?Social history ? reports that he has quit smoking. His smoking use included cigarettes. He has a 5.00 pack-year smoking history. He has never used smokeless tobacco. He reports current alcohol use. He reports that he does not use drugs. ? ?No Known Allergies ? ?Family History  ?Problem Relation Age of Onset  ? Heart attack Mother   ? Heart disease Mother   ? Diabetes Father   ? Stroke Father   ? Hypertension Father   ? Hyperlipidemia Sister   ? Hypertension Sister   ? Breast cancer Sister   ? ? ?Prior to Admission medications   ?Medication Sig Start Date End Date Taking? Authorizing Provider  ?ACCU-CHEK GUIDE test strip  11/09/19   [provider]  ?acetaminophen (TYLENOL) 500 MG tablet Take 2 tablets (1,000 mg total) by mouth every 8 (eight) hours. 09/04/21   Aline August, MD  ?albuterol (VENTOLIN HFA) 108 (90 Base) MCG/ACT inhaler Inhale 1 puff into the lungs daily as needed for wheezing. 05/10/20   [provider]  ?atorvastatin (LIPITOR) 80 MG tablet Take 80 mg by mouth daily.    [provider]  ?budesonide-formoterol (SYMBICORT) 160-4.5 MCG/ACT inhaler Inhale 2 puffs into the lungs 2 (two) times daily. ?Patient not taking: Reported on 09/01/2021    [name], MD  ?ezetimibe (ZETIA) 10 MG tablet Take 10 mg by mouth daily.    [provider]  ?finasteride (PROSCAR) 5 MG tablet Take 5 mg by mouth daily.  ?Patient not taking: Reported on 09/01/2021 02/18/16   [provider]  ?folic acid (FOLVITE) 1 MG tablet TAKE 1 TABLET BY MOUTH  DAILY ?Patient taking differently: Take 1 mg by mouth daily. 10/24/20   Curt Bears, MD  ?latanoprost (XALATAN) 0.005 % ophthalmic solution Place 1 drop into both eyes at bedtime.  08/09/18   [provider]   ?metFORMIN (GLUCOPHAGE) 1000 MG tablet Take 1,000 mg by mouth 2 (two) times daily.  05/25/13   [provider]  ?metoprolol tartrate (LOPRESSOR) 25 MG tablet Take 25 mg by mouth 2 (two) times daily.    [provider]  ?Multiple Vitamin (MULTIVITAMIN WITH MINERALS) TABS tablet Take 1 tablet by mouth daily. Centrum Silver    [provider]  ?nitroGLYCERIN (NITROSTAT) 0.4 MG SL tablet Place 0.4 mg under the tongue every 5 (five) minutes as needed for chest pain. ?Patient not taking: Reported on 09/01/2021    [provider]  ?ondansetron (ZOFRAN) 4 MG tablet Take 1 tablet (4 mg total) by mouth every 6 (six) hours as needed for nausea. 09/04/21   Aline August, MD  ?oxyCODONE (OXY IR/ROXICODONE) 5 MG immediate release tablet Take 1 tablet (5 mg total) by mouth every 6 (six) hours as needed for severe pain. 09/04/21   Aline August, MD  ?pantoprazole (PROTONIX) 40 MG tablet Take 1 tablet (40 mg total) by mouth 2 (two) times  daily before a meal. 08/29/21   Thurnell Lose, MD  ?Pharmacist Choice Lancets [place]  07/30/20   [provider]  ?Polyethyl Glycol-Propyl Glycol (SYSTANE) 0.4-0.3 % SOLN Place 1-2 drops into both eyes 3 (three) times daily as needed (dry/irritated eyes.).    [provider]  ?Respiratory Therapy Supplies (CARETOUCH 2 CPAP HOSE HANGER) MISC See admin instructions. 07/29/18   [provider]  ?traMADol (ULTRAM) 50 MG tablet Take 1 tablet (50 mg total) by mouth every 12 (twelve) hours as needed for moderate pain or severe pain. 08/29/21   Thurnell Lose, MD  ? ? ?Physical Exam: ?Vitals:  ? 09/10/21 0700 09/10/21 0730 09/10/21 0800 09/10/21 0807  ?BP: 107/80 119/64 100/64   ?Pulse: 94 95    ?Resp: 17 13 13    ?Temp:    98 ?F (36.7 ?C)  ?TempSrc:    Oral  ?SpO2: 100% 100% 96%   ?Weight:      ?Height:      ? ? ?Constitutional: NAD, calm, comfortable.  Chronically ill looking and deconditioned.  Elderly male lying in bed. ?Vitals:  ? 09/10/21  0700 09/10/21 0730 09/10/21 0800 09/10/21 0807  ?BP: 107/80 119/64 100/64   ?Pulse: 94 95    ?Resp: 17 13 13    ?Temp:    98 ?F (36.7 ?C)  ?TempSrc:    Oral  ?SpO2: 100% 100% 96%   ?Weight:      ?Height:

## 2021-09-10 NOTE — Consult Note (Signed)
? ?                                                                                ?Consultation Note ?Date: 09/10/2021  ? ?Patient Name: Trevor Bailey  ?DOB: 1946/03/27  MRN: [id number]  Age / Sex: 77 y.o., male  ?PCP: Trevor Carol, MD ?Referring Physician: Aline August, MD ? ?Reason for Consultation: Establishing goals of care, Pain control, and Psychosocial/spiritual support ? ?HPI/Patient Profile: 76 y.o. 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 Protonix twice daily  ? ?Another hospitalization from 09/01/2021-09/04/2021 for hemorrhagic shock/acute blood loss anemia from GI bleeding requiring 3 units packed red cells transfusion for which GI recommended IR evaluation but by the time IR evaluated patient had stabilized with stabilized hemoglobin and patient was discharged after hemoglobin remained stable and patient tolerated diet.   ? ?Admission 08/24/21 having drank a bottle containing hand soap  or mouthwash secondary to increasing pain around the radiation area in his neck.  Unsure of exact events of this admission. ? ?Patient admitted with weakness and left shoulder/neck pain.  Patient has been dealing with progressively worsening left neck/shoulder pain for the last month and is on pain medications and is being followed by oncology and radiation oncology for the same.  He also complains of worsening generalized weakness but denies any bloody stools.  He had an episode of black stools 2 days ago.  He has had some intermittent abdominal pain but no nausea, vomiting or diarrhea.  Has intermittent cough and shortness of breath which has not changed.  ? ?ED Course:  Patient was apparently tachycardic and hypotensive for EMS.  Blood pressure improved in the ED without intervention.  Hemoglobin was 7, hemoglobin 11.2 on discharge on 09/04/2021.  Creatinine 1.68, 0.85 on 09/04/2021.  2 units packed red cells transfusion was ordered by ED provider.   ? ?Patient and family face ongoing treatment option decisions, advanced directive decisions and anticipatory care needs. ? ?Clinical Assessment and Goals of Care: ? ?This NP Trevor Bailey reviewed medical records, received report from team, assessed the patient and then meet at the patient's bedside along with his wife to discuss diagnosis, prognosis, GOC, EOL wishes disposition and options. ?  ?Concept of Palliative Care was introduced as specialized medical care for people and their families living with serious illness.  If focuses on providing relief from the symptoms and stress of a serious illness.  The goal is to improve quality of life for both the patient and the family. ? ? Values and goals of care important to patient and family were attempted to be elicited. ?  ? ?Created space and opportunity for patient  and family to explore thoughts and feelings regarding current medical situation. ?Is able to clearly speak to the seriousness of his cancer diagnosis, however he and his wife are "confused with what we  are supposed to  do next." ? ? ?Education offered on the natural trajectory of human this within the context of multiple co-morbidities, and the limitations of medical interventions to prolong quality of life when the body begins to fail to thrive. ? ?Today [name] repeats multiple times "all I want to do is be comfortable" ? ?Patient's wife is able to speak to [name] continued physical and functional decline and the increasing care needs in the home. ?  ?A  discussion was had today regarding advanced directives.  Concepts specific to code status, artifical feeding and hydration, continued IV antibiotics and  rehospitalization was had.   ? ?The difference between a aggressive medical intervention path  and a palliative comfort care path for this patient at this time was had.  ? ? Questions and concerns addressed.  Patient  encouraged to call with questions or concerns.   ?  ?PMT will continue to support holistically. ?  ?  ?  ?  ? ?No documented healthcare power of attorney or advanced care planning documents currently.  Encourage patient to secure these documents with the assistance of our spiritual care department/declined at this time. ? ?Education offered on the importance of clarifying goals of care, and the value of exploring end-of-life wishes ensuring that decisions are made with the patient's goals of care in best interest. ?  ? ?NEXT OF KIN/wife/main decision maker  ?  ? ?SUMMARY OF RECOMMENDATIONS   ? ?Code Status/Advance Care Planning: ?Full code ?Educated patient/family to consider DNR/DNI status understanding evidenced based poor outcomes in similar hospitalized patient, as the cause of arrest is likely associated with advanced chronic illness rather than an easily reversible acute cardio-pulmonary event.  ? ? ? ?Symptom Management:  ?Bony metastatic pain: add Tylenol 1000 mg p.o. every 12 hours scheduled ?               Education offered on pain management strategies ?Continue with Norco 5-325 mg 1 to 2 tablets p.o. every 6 hours as needed for moderate pain ?Continue morphine 2 mg injection IV every 2 hours as needed for severe pain. ?Recommend stool softener to offset effects of opioids once GI clears patient with GI bleed ? ?Palliative Prophylaxis:  ?Delirium Protocol, Frequent Pain Assessment, and Oral Care ? ?Additional Recommendations (Limitations, Scope, Preferences): ?Full Scope Treatment ? ?Psycho-social/Spiritual:  ?Desire for further Chaplaincy support:no-declined ?Additional Recommendations: Education on Hospice ? ?Prognosis:  ?Unable to determine ? ?Discharge Planning: To Be Determined  ? ?   ? ?Primary Diagnoses: ?Present on Admission: ? GI bleed ? 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 Paroxysmal atrial fibrillation (HCC) ? Essential hypertension ? AKI (acute kidney injury) ([place]) ? ? ?I have reviewed the medical record, interviewed the patient and family, and examined the patient. The following aspects are pertinent. ? ?Past Medical History:  ?Diagnosis Date  ? Adenopathy   ? left supraclavicular lymph node  ? Allergies   ? COPD (chronic obstructive pulmonary disease) ([place])   ? Coronary disease   ? Diabetes ([place])   ? Enlarged prostate   ? GERD (gastroesophageal reflux disease)   ? Glaucoma   ? Hyperlipidemia   ? Hypertension   ? Myocardial infarction [facility])   ? nscl ca dx'd 12/2018  ? Sleep apnea   ? wears CPAP  ? Wears partial dentures   ? upper and lower  ? ?Social History  ? ?Socioeconomic History  ? Marital status: Married  ?  Spouse name: Not  on file  ? Number of children: Not on file  ? Years of education: Not on file  ? Highest education level: Not on file  ?Occupational History  ? Not on file  ?Tobacco Use  ? Smoking status: Former  ?  Packs/day: 0.10  ?  Years: 50.00  ?  Pack years: 5.00  ?  Types: Cigarettes  ? Smokeless tobacco: Never  ? Tobacco comments:  ?  quit smoking cigarettes July 2020  ?Vaping Use  ? Vaping Use: Never used  ?Substance and Sexual Activity  ? Alcohol use: Yes  ?  Alcohol/week: 0.0 standard drinks  ?  Comment: moderately  ? Drug use: No  ? Sexual activity: Not on file  ?Other Topics Concern  ? Not on file  ?Social History Narrative  ? Not on file  ? ?Social Determinants of Health  ? ?Financial Resource Strain: Not on file  ?Food Insecurity: Not on file  ?Transportation Needs: Not on file  ?Physical Activity: Not on file  ?Stress: Not on file  ?Social Connections: Not on file  ? ?Family History  ?Problem Relation Age of Onset  ? Heart attack Mother   ? Heart disease Mother   ? Diabetes Father   ? Stroke Father   ? Hypertension Father    ? Hyperlipidemia Sister   ? Hypertension Sister   ? Breast cancer Sister   ? ?Scheduled Meds: ? acetaminophen  1,000 mg Oral Q12H  ? atorvastatin  80 mg Oral QHS  ? Chlorhexidine Gluconate Cloth  6 each Topical [place]oma Laming

## 2021-09-10 NOTE — ED Notes (Signed)
Save blue in main lab 

## 2021-09-10 NOTE — ED Provider Notes (Signed)
?[facility] DEPT ?Provider Note ? ? ?CSN: [id number] ?Arrival date & time: 09/10/21  0310 ? ?  ? ?History ? ?Chief Complaint  ?Patient presents with  ? Shoulder Pain  ? ? ?Trevor Bailey is a 76 y.o. male. ? ?The history is provided by the patient and medical records.  ?Shoulder Pain ?Trevor Bailey is a 76 y.o. male who presents to the Emergency Department complaining of neck pain. He presents to the ED for evaluation of one month of progressive posterior and lateral neck pain as well as shoulder blade pain.   ? ?Feels cold, no fever.  Has some intermittent left upper chest pain.  Has cough - baseline.  Has sob - baseline.  Has epigastric abdominal pain - started this afternoon.  No N/V/D.  No hematochezia or melena - had some recently while in the hospital.  No leg edema.  ? ?Stage IV lung cancer.  ?  ? ?Home Medications ?Prior to Admission medications   ?Medication Sig Start Date End Date Taking? Authorizing Provider  ?ACCU-CHEK GUIDE test strip  11/09/19   [provider]  ?acetaminophen (TYLENOL) 500 MG tablet Take 2 tablets (1,000 mg total) by mouth every 8 (eight) hours. 09/04/21   Aline August, MD  ?albuterol (VENTOLIN HFA) 108 (90 Base) MCG/ACT inhaler Inhale 1 puff into the lungs daily as needed for wheezing. 05/10/20   [provider]  ?atorvastatin (LIPITOR) 80 MG tablet Take 80 mg by mouth daily.    [provider]  ?budesonide-formoterol (SYMBICORT) 160-4.5 MCG/ACT inhaler Inhale 2 puffs into the lungs 2 (two) times daily. ?Patient not taking: Reported on 09/01/2021    [name], MD  ?ezetimibe (ZETIA) 10 MG tablet Take 10 mg by mouth daily.    [provider]  ?finasteride (PROSCAR) 5 MG tablet Take 5 mg by mouth daily.  ?Patient not taking: Reported on 09/01/2021 02/18/16   [provider]  ?folic acid (FOLVITE) 1 MG tablet TAKE 1 TABLET BY MOUTH  DAILY ?Patient taking differently: Take 1 mg by mouth daily. 10/24/20   Curt Bears, MD  ?latanoprost (XALATAN) 0.005 % ophthalmic solution Place 1 drop into both eyes at bedtime.  08/09/18   [provider]  ?metFORMIN (GLUCOPHAGE) 1000 MG tablet Take 1,000 mg by mouth 2 (two) times daily.  05/25/13   [provider]  ?metoprolol tartrate (LOPRESSOR) 25 MG tablet Take 25 mg by mouth 2 (two) times daily.    [provider]  ?Multiple Vitamin (MULTIVITAMIN WITH MINERALS) TABS tablet Take 1 tablet by mouth daily. Centrum Silver    [provider]  ?nitroGLYCERIN (NITROSTAT) 0.4 MG SL tablet Place 0.4 mg under the tongue every 5 (five) minutes as needed for chest pain. ?Patient not taking: Reported on 09/01/2021    [provider]  ?ondansetron (ZOFRAN) 4 MG tablet Take 1 tablet (4 mg total) by mouth every 6 (six) hours as needed for nausea. 09/04/21   Aline August, MD  ?oxyCODONE (OXY IR/ROXICODONE) 5 MG immediate release tablet Take 1 tablet (5 mg total) by mouth every 6 (six) hours as needed for severe pain. 09/04/21   Aline August, MD  ?pantoprazole (PROTONIX) 40 MG tablet Take 1 tablet (40 mg total) by mouth 2 (two) times daily before a meal. 08/29/21   Thurnell Lose, MD  ?Pharmacist Choice Lancets [place]  07/30/20   [provider]  ?Polyethyl Glycol-Propyl Glycol (SYSTANE) 0.4-0.3 % SOLN Place 1-2 drops into both eyes 3 (three)  times daily as needed (dry/irritated eyes.).    [provider]  ?Respiratory Therapy Supplies (CARETOUCH 2 CPAP HOSE HANGER) MISC See admin instructions. 07/29/18   [provider]  ?traMADol (ULTRAM) 50 MG tablet Take 1 tablet (50 mg total) by mouth every 12 (twelve) hours as needed for moderate pain or severe pain. 08/29/21   Thurnell Lose, MD  ?   ? ?Allergies    ?Patient has no known allergies.   ? ?Review of Systems   ?Review of Systems  ?All other systems reviewed and are negative. ? ?Physical Exam ?Updated Vital Signs ?BP 107/80   Pulse 95   Temp 97.6 ?F (36.4 ?C) (Oral)   Resp 17    Ht 5\' 11"  (1.803 m)   Wt 72.6 kg   SpO2 100%   BMI 22.32 kg/m?  ?Physical Exam ?Vitals and nursing note reviewed.  ?Constitutional:   ?   Appearance: He is well-developed.  ?HENT:  ?   Head: Normocephalic and atraumatic.  ?Neck:  ?   Comments: Thick firm swelling to left posterior lateral neck with local tenderness to palpation ?Cardiovascular:  ?   Rate and Rhythm: Regular rhythm. Tachycardia present.  ?   Heart sounds: No murmur heard. ?Pulmonary:  ?   Effort: Pulmonary effort is normal. No respiratory distress.  ?   Comments: rhonchi ?Abdominal:  ?   Palpations: Abdomen is soft.  ?   Tenderness: There is no guarding or rebound.  ?   Comments: Mild to moderate epigastric tenderness  ?Musculoskeletal:     ?   General: No swelling or tenderness.  ?Skin: ?   General: Skin is warm and dry.  ?Neurological:  ?   Mental Status: He is alert and oriented to person, place, and time.  ?Psychiatric:     ?   Behavior: Behavior normal.  ? ? ?ED Results / Procedures / Treatments   ?Labs ?(all labs ordered are listed, but only abnormal results are displayed) ?Labs Reviewed  ?CBC WITH DIFFERENTIAL/PLATELET - Abnormal; Notable for the following components:  ?    Result Value  ? RBC 2.42 (*)   ? Hemoglobin 7.0 (*)   ? HCT 22.5 (*)   ? RDW 16.3 (*)   ? Platelets 604 (*)   ? Neutro Abs 8.2 (*)   ? All other components within normal limits  ?COMPREHENSIVE METABOLIC PANEL - Abnormal; Notable for the following components:  ? Sodium 134 (*)   ? CO2 20 (*)   ? Glucose, Bld 196 (*)   ? BUN 49 (*)   ? Creatinine, Ser 1.68 (*)   ? Calcium 8.6 (*)   ? Total Protein 5.5 (*)   ? Albumin 1.8 (*)   ? GFR, Estimated 42 (*)   ? All other components within normal limits  ?LIPASE, BLOOD  ?TYPE AND SCREEN  ?PREPARE RBC (CROSSMATCH)  ? ? ?EKG ?EKG Interpretation ? ?Date/Time:  Tuesday September 10 2021 05:33:39 EDT ?Ventricular Rate:  97 ?PR Interval:  202 ?QRS Duration: 85 ?QT Interval:  307 ?QTC Calculation: 390 ?R Axis:   43 ?Text  Interpretation: Sinus rhythm Atrial premature complexes Nonspecific T abnormalities, lateral leads Confirmed by Quintella Reichert [id number]) on 09/10/2021 6:08:52 AM ? ?Radiology ?DG Chest Port 1 View ? ?Result Date: 09/10/2021 ?CLINICAL DATA:  GI bleeding and chest pain EXAM: PORTABLE CHEST 1 VIEW COMPARISON:  09/01/2021 FINDINGS: Hazy appearance of the left chest attributed to pleural fluid based on chest CT last month. Post treatment volume  loss and opacity at the left apex. Clear right lung. Normal heart size and mediastinal contours. IMPRESSION: Small left pleural effusion similar to CT from last month. Electronically Signed   By: Jorje Guild M.D.   On: 09/10/2021 05:40   ? ?Procedures ?Procedures  ? ?CRITICAL CARE ?Performed by: Quintella Reichert ? ? ?Total critical care time: 35 minutes ? ?Critical care time was exclusive of separately billable procedures and treating other patients. ? ?Critical care was necessary to treat or prevent imminent or life-threatening deterioration. ? ?Critical care was time spent personally by me on the following activities: development of treatment plan with patient and/or surrogate as well as nursing, discussions with consultants, evaluation of patient's response to treatment, examination of patient, obtaining history from patient or surrogate, ordering and performing treatments and interventions, ordering and review of laboratory studies, ordering and review of radiographic studies, pulse oximetry and re-evaluation of patient's condition. ? ?Medications Ordered in ED ?Medications  ?0.9 %  sodium chloride infusion (Manually program via Guardrails IV Fluids) (0 mLs Intravenous Hold 09/10/21 0602)  ?pantoprozole (PROTONIX) 80 mg /NS 100 mL infusion (8 mg/hr Intravenous New Bag/Given 09/10/21 0619)  ?pantoprazole (PROTONIX) injection 40 mg (has no administration in time range)  ?fentaNYL (SUBLIMAZE) injection 50 mcg (50 mcg Intravenous Given 09/10/21 0522)  ?pantoprazole (PROTONIX) 80 mg  /NS 100 mL IVPB (0 mg Intravenous Stopped 09/10/21 0618)  ?sodium chloride 0.9 % bolus 500 mL (0 mLs Intravenous Stopped 09/10/21 0601)  ? ? ?ED Course/ Medical Decision Making/ A&P ?  ?                        ?Medical Decision

## 2021-09-10 NOTE — Consult Note (Signed)
Referring Provider: TRH ?Primary Care Physician:  [name], MD ?Primary Gastroenterologist:  [facility] ? ?Reason for Consultation:  melena, anemia ? ?HPI: Trevor Bailey is a 76 y.o. male with past medical history significant of CAD status post stenting, diabetes mellitus type 2, sleep apnea, atrial fibrillation on Eliquis, stage IV non-small cell lung cancer on chemoradiation.  ? ?Patient has recent history of admission for anemia and melena for which he was hospitalized (08/24/21-08/28/21).  He was treated with 4 units prbc stablized .Workup at that time noted large duodenal ulcer. His  and eliquis was discontinued and he was discharged on ppi bid.  ? ?At presentation to the ED patient's hemoglobin was 7.  Previously was 11.2 on 09/04/2021.  Patient reports 1 episode of melena 2 days ago.  No bowel movemens since. Notes last time he had melena was prior to his first admission.  No melena in between first admission and current admission.  ? ?EGD 08/26/2021 ?1 large nonbleeding cratered duodenal ulcer.  Lesion was 30 mm in largest dimension.  The vessels not amenable to endoscopic therapy. ? ?Last colonoscopy 07/2016 where 2 small hyperplastic polyps were removed. ? ?He denies NSAID use, alcohol use, smoking.  Denies family history of colon cancer. ? ?Unsure if he is taking his Eliquis recently because his granddaughter puts his medicines together for him.  Patient did not recall if he was supposed to stop his Eliquis or not. ? ?Past Medical History:  ?Diagnosis Date  ? Adenopathy   ? left supraclavicular lymph node  ? Allergies   ? COPD (chronic obstructive pulmonary disease) ([place])   ? Coronary disease   ? Diabetes ([place])   ? Enlarged prostate   ? GERD (gastroesophageal reflux disease)   ? Glaucoma   ? Hyperlipidemia   ? Hypertension   ? Myocardial infarction [facility])   ? nscl ca dx'd 12/2018  ? Sleep apnea   ? wears CPAP  ? Wears partial dentures   ? upper and lower  ? ? ?Past Surgical History:  ?Procedure Laterality  Date  ? ABDOMINAL AORTAGRAM Left 09/16/2013  ? Procedure: ABDOMINAL AORTAGRAM;  Surgeon: Elam Dutch, MD;  Location: [facility] C[facility] LAB;  Service: Cardiovascular;  Laterality: Left;  ? CARDIAC CATHETERIZATION  01/2011  ? When there was segmental stenosis of the distal RCA, patent PCA stent, patent circumflex stent, and a patent small diagonal with 90% ISR.   ? COLONOSCOPY W/ BIOPSIES AND POLYPECTOMY    ? CORONARY ANGIOPLASTY  may 2002  ? Non-DES stenting of his circumflex, non-DES stenting of the RCA  ? ESOPHAGOGASTRODUODENOSCOPY (EGD) WITH PROPOFOL N/A 08/26/2021  ? Procedure: ESOPHAGOGASTRODUODENOSCOPY (EGD) WITH PROPOFOL;  Surgeon: Arta Silence, MD;  Location: [place];  Service: Gastroenterology;  Laterality: N/A;  ? HYDROCELE EXCISION / REPAIR  2009  ? by Dr Janice Norrie  ? INGUINAL HERNIA REPAIR Bilateral   ? Dr Bubba Camp  ? LYMPH NODE BIOPSY Left 01/10/2019  ? Procedure: left supraclavicular LYMPH NODE BIOPSY;  Surgeon: [name], MD;  Location: [place];  Service: Thoracic;  Laterality: Left;  ? MULTIPLE TOOTH EXTRACTIONS    ? NM MYOCAR PERF WALL MOTION  01/08/2011  ? moderate in size and intensity area of reversible ischemia in the basal to mid inferior and septal territories. Abnormal study  ? stents  2008  ? proximal RCA, DES for progession of disease.  ? US ECHOCARDIOGRAPHY  01/12/2012  ? mild concentric LVH, borderline LA enlargement, mild to mod TR  ? ? ?  Prior to Admission medications   ?Medication Sig Start Date End Date Taking? Authorizing Provider  ?ACCU-CHEK GUIDE test strip  11/09/19   [provider]  ?acetaminophen (TYLENOL) 500 MG tablet Take 2 tablets (1,000 mg total) by mouth every 8 (eight) hours. 09/04/21   Aline August, MD  ?albuterol (VENTOLIN HFA) 108 (90 Base) MCG/ACT inhaler Inhale 1 puff into the lungs daily as needed for wheezing. 05/10/20   [provider]  ?atorvastatin (LIPITOR) 80 MG tablet Take 80 mg by mouth daily.    [provider]   ?budesonide-formoterol (SYMBICORT) 160-4.5 MCG/ACT inhaler Inhale 2 puffs into the lungs 2 (two) times daily. ?Patient not taking: Reported on 09/01/2021    [name], MD  ?ezetimibe (ZETIA) 10 MG tablet Take 10 mg by mouth daily.    [provider]  ?finasteride (PROSCAR) 5 MG tablet Take 5 mg by mouth daily.  ?Patient not taking: Reported on 09/01/2021 02/18/16   [provider]  ?folic acid (FOLVITE) 1 MG tablet TAKE 1 TABLET BY MOUTH  DAILY ?Patient taking differently: Take 1 mg by mouth daily. 10/24/20   Curt Bears, MD  ?latanoprost (XALATAN) 0.005 % ophthalmic solution Place 1 drop into both eyes at bedtime.  08/09/18   [provider]  ?metFORMIN (GLUCOPHAGE) 1000 MG tablet Take 1,000 mg by mouth 2 (two) times daily.  05/25/13   [provider]  ?metoprolol tartrate (LOPRESSOR) 25 MG tablet Take 25 mg by mouth 2 (two) times daily.    [provider]  ?Multiple Vitamin (MULTIVITAMIN WITH MINERALS) TABS tablet Take 1 tablet by mouth daily. Centrum Silver    [provider]  ?nitroGLYCERIN (NITROSTAT) 0.4 MG SL tablet Place 0.4 mg under the tongue every 5 (five) minutes as needed for chest pain. ?Patient not taking: Reported on 09/01/2021    [provider]  ?ondansetron (ZOFRAN) 4 MG tablet Take 1 tablet (4 mg total) by mouth every 6 (six) hours as needed for nausea. 09/04/21   Aline August, MD  ?oxyCODONE (OXY IR/ROXICODONE) 5 MG immediate release tablet Take 1 tablet (5 mg total) by mouth every 6 (six) hours as needed for severe pain. 09/04/21   Aline August, MD  ?pantoprazole (PROTONIX) 40 MG tablet Take 1 tablet (40 mg total) by mouth 2 (two) times daily before a meal. 08/29/21   Thurnell Lose, MD  ?Pharmacist Choice Lancets [place]  07/30/20   [provider]  ?Polyethyl Glycol-Propyl Glycol (SYSTANE) 0.4-0.3 % SOLN Place 1-2 drops into both eyes 3 (three) times daily as needed (dry/irritated eyes.).    [provider]   ?Respiratory Therapy Supplies (CARETOUCH 2 CPAP HOSE HANGER) MISC See admin instructions. 07/29/18   [provider]  ?traMADol (ULTRAM) 50 MG tablet Take 1 tablet (50 mg total) by mouth every 12 (twelve) hours as needed for moderate pain or severe pain. 08/29/21   Thurnell Lose, MD  ? ? ?Scheduled Meds: ? sodium chloride   Intravenous Once  ? [START ON 09/13/2021] pantoprazole  40 mg Intravenous Q12H  ? ?Continuous Infusions: ? pantoprazole 8 mg/hr (09/10/21 9563)  ? ?PRN Meds:. ? ?Allergies as of 09/10/2021  ? (No Known Allergies)  ? ? ?Family History  ?Problem Relation Age of Onset  ? Heart attack Mother   ? Heart disease Mother   ? Diabetes Father   ? Stroke Father   ? Hypertension Father   ? Hyperlipidemia Sister   ? Hypertension Sister   ? Breast cancer Sister   ? ? ?  Social History  ? ?Socioeconomic History  ? Marital status: Married  ?  Spouse name: Not on file  ? Number of children: Not on file  ? Years of education: Not on file  ? Highest education level: Not on file  ?Occupational History  ? Not on file  ?Tobacco Use  ? Smoking status: Former  ?  Packs/day: 0.10  ?  Years: 50.00  ?  Pack years: 5.00  ?  Types: Cigarettes  ? Smokeless tobacco: Never  ? Tobacco comments:  ?  quit smoking cigarettes July 2020  ?Vaping Use  ? Vaping Use: Never used  ?Substance and Sexual Activity  ? Alcohol use: Yes  ?  Alcohol/week: 0.0 standard drinks  ?  Comment: moderately  ? Drug use: No  ? Sexual activity: Not on file  ?Other Topics Concern  ? Not on file  ?Social History Narrative  ? Not on file  ? ?Social Determinants of Health  ? ?Financial Resource Strain: Not on file  ?Food Insecurity: Not on file  ?Transportation Needs: Not on file  ?Physical Activity: Not on file  ?Stress: Not on file  ?Social Connections: Not on file  ?Intimate Partner Violence: Not on file  ? ? ?Review of Systems: Review of Systems  ?Constitutional:  Negative for chills and fever.  ?HENT:  Negative for hearing loss and tinnitus.    ?Eyes:  Negative for blurred vision and double vision.  ?Respiratory:  Negative for cough and hemoptysis.   ?Cardiovascular:  Negative for chest pain and palpitations.  ?Gastrointestinal:  Positive for melena. Negative for abdomi

## 2021-09-10 NOTE — ED Triage Notes (Signed)
Pt BIB EMS with reports of left shoulder pain for a while.  ?

## 2021-09-11 ENCOUNTER — Observation Stay (HOSPITAL_COMMUNITY): Payer: Medicare Other | Admitting: Anesthesiology

## 2021-09-11 ENCOUNTER — Encounter (HOSPITAL_COMMUNITY)
Admission: EM | Disposition: A | Discharge status: Hospice | Payer: Self-pay | Source: Home / Self Care | Attending: Internal Medicine

## 2021-09-11 ENCOUNTER — Inpatient Hospital Stay (HOSPITAL_COMMUNITY): Payer: Medicare Other

## 2021-09-11 DIAGNOSIS — C3412 Malignant neoplasm of upper lobe, left bronchus or lung: Secondary | ICD-10-CM | POA: Diagnosis not present

## 2021-09-11 DIAGNOSIS — Z923 Personal history of irradiation: Secondary | ICD-10-CM | POA: Diagnosis not present

## 2021-09-11 DIAGNOSIS — E1165 Type 2 diabetes mellitus with hyperglycemia: Secondary | ICD-10-CM | POA: Diagnosis present

## 2021-09-11 DIAGNOSIS — K264 Chronic or unspecified duodenal ulcer with hemorrhage: Secondary | ICD-10-CM | POA: Diagnosis not present

## 2021-09-11 DIAGNOSIS — K921 Melena: Secondary | ICD-10-CM | POA: Diagnosis not present

## 2021-09-11 DIAGNOSIS — G893 Neoplasm related pain (acute) (chronic): Secondary | ICD-10-CM

## 2021-09-11 DIAGNOSIS — E861 Hypovolemia: Secondary | ICD-10-CM | POA: Diagnosis present

## 2021-09-11 DIAGNOSIS — Z87891 Personal history of nicotine dependence: Secondary | ICD-10-CM

## 2021-09-11 DIAGNOSIS — J449 Chronic obstructive pulmonary disease, unspecified: Secondary | ICD-10-CM | POA: Diagnosis not present

## 2021-09-11 DIAGNOSIS — Z9989 Dependence on other enabling machines and devices: Secondary | ICD-10-CM

## 2021-09-11 DIAGNOSIS — D62 Acute posthemorrhagic anemia: Secondary | ICD-10-CM | POA: Diagnosis present

## 2021-09-11 DIAGNOSIS — C7951 Secondary malignant neoplasm of bone: Secondary | ICD-10-CM | POA: Diagnosis present

## 2021-09-11 DIAGNOSIS — E8809 Other disorders of plasma-protein metabolism, not elsewhere classified: Secondary | ICD-10-CM | POA: Diagnosis present

## 2021-09-11 DIAGNOSIS — G4733 Obstructive sleep apnea (adult) (pediatric): Secondary | ICD-10-CM

## 2021-09-11 DIAGNOSIS — K319 Disease of stomach and duodenum, unspecified: Secondary | ICD-10-CM | POA: Diagnosis not present

## 2021-09-11 DIAGNOSIS — Z955 Presence of coronary angioplasty implant and graft: Secondary | ICD-10-CM | POA: Diagnosis not present

## 2021-09-11 DIAGNOSIS — Z79899 Other long term (current) drug therapy: Secondary | ICD-10-CM | POA: Diagnosis not present

## 2021-09-11 DIAGNOSIS — K269 Duodenal ulcer, unspecified as acute or chronic, without hemorrhage or perforation: Secondary | ICD-10-CM | POA: Diagnosis not present

## 2021-09-11 DIAGNOSIS — I2510 Atherosclerotic heart disease of native coronary artery without angina pectoris: Secondary | ICD-10-CM | POA: Diagnosis present

## 2021-09-11 DIAGNOSIS — I10 Essential (primary) hypertension: Secondary | ICD-10-CM | POA: Diagnosis present

## 2021-09-11 DIAGNOSIS — Z7984 Long term (current) use of oral hypoglycemic drugs: Secondary | ICD-10-CM | POA: Diagnosis not present

## 2021-09-11 DIAGNOSIS — Z9861 Coronary angioplasty status: Secondary | ICD-10-CM | POA: Diagnosis not present

## 2021-09-11 DIAGNOSIS — Z7189 Other specified counseling: Secondary | ICD-10-CM | POA: Diagnosis not present

## 2021-09-11 DIAGNOSIS — K922 Gastrointestinal hemorrhage, unspecified: Secondary | ICD-10-CM | POA: Diagnosis not present

## 2021-09-11 DIAGNOSIS — Z515 Encounter for palliative care: Secondary | ICD-10-CM | POA: Diagnosis not present

## 2021-09-11 DIAGNOSIS — I480 Paroxysmal atrial fibrillation: Secondary | ICD-10-CM | POA: Diagnosis present

## 2021-09-11 DIAGNOSIS — F22 Delusional disorders: Secondary | ICD-10-CM | POA: Diagnosis present

## 2021-09-11 DIAGNOSIS — D649 Anemia, unspecified: Secondary | ICD-10-CM | POA: Diagnosis not present

## 2021-09-11 DIAGNOSIS — Z5329 Procedure and treatment not carried out because of patient's decision for other reasons: Secondary | ICD-10-CM | POA: Diagnosis present

## 2021-09-11 DIAGNOSIS — C778 Secondary and unspecified malignant neoplasm of lymph nodes of multiple regions: Secondary | ICD-10-CM | POA: Diagnosis present

## 2021-09-11 DIAGNOSIS — N179 Acute kidney failure, unspecified: Secondary | ICD-10-CM | POA: Diagnosis present

## 2021-09-11 DIAGNOSIS — Z7951 Long term (current) use of inhaled steroids: Secondary | ICD-10-CM | POA: Diagnosis not present

## 2021-09-11 DIAGNOSIS — E871 Hypo-osmolality and hyponatremia: Secondary | ICD-10-CM | POA: Diagnosis present

## 2021-09-11 DIAGNOSIS — Z66 Do not resuscitate: Secondary | ICD-10-CM | POA: Diagnosis not present

## 2021-09-11 HISTORY — PX: IR ANGIOGRAM SELECTIVE EACH ADDITIONAL VESSEL: IMG667

## 2021-09-11 HISTORY — PX: BIOPSY: SHX5522

## 2021-09-11 HISTORY — PX: ESOPHAGOGASTRODUODENOSCOPY: SHX5428

## 2021-09-11 HISTORY — PX: IR US GUIDE VASC ACCESS RIGHT: IMG2390

## 2021-09-11 HISTORY — PX: IR ANGIOGRAM VISCERAL SELECTIVE: IMG657

## 2021-09-11 HISTORY — PX: IR EMBO ART  VEN HEMORR LYMPH EXTRAV  INC GUIDE ROADMAPPING: IMG5450

## 2021-09-11 LAB — COMPREHENSIVE METABOLIC PANEL
ALT: 11 U/L (ref 0–44)
AST: 12 U/L — ABNORMAL LOW (ref 15–41)
Albumin: 1.8 g/dL — ABNORMAL LOW (ref 3.5–5.0)
Alkaline Phosphatase: 83 U/L (ref 38–126)
Anion gap: 8 (ref 5–15)
BUN: 37 mg/dL — ABNORMAL HIGH (ref 8–23)
CO2: 19 mmol/L — ABNORMAL LOW (ref 22–32)
Calcium: 8 mg/dL — ABNORMAL LOW (ref 8.9–10.3)
Chloride: 108 mmol/L (ref 98–111)
Creatinine, Ser: 1.26 mg/dL — ABNORMAL HIGH (ref 0.61–1.24)
GFR, Estimated: 59 mL/min — ABNORMAL LOW (ref >60)
Glucose, Bld: 148 mg/dL — ABNORMAL HIGH (ref 70–99)
Potassium: 4.1 mmol/L (ref 3.5–5.1)
Sodium: 135 mmol/L (ref 135–145)
Total Bilirubin: 0.5 mg/dL (ref 0.3–1.2)
Total Protein: 4.9 g/dL — ABNORMAL LOW (ref 6.5–8.1)

## 2021-09-11 LAB — GLUCOSE, CAPILLARY
Glucose-Capillary: 155 mg/dL — ABNORMAL HIGH (ref 70–99)
Glucose-Capillary: 120 mg/dL — ABNORMAL HIGH (ref 70–99)
Glucose-Capillary: 143 mg/dL — ABNORMAL HIGH (ref 70–99)
Glucose-Capillary: 130 mg/dL — ABNORMAL HIGH (ref 70–99)

## 2021-09-11 LAB — TYPE AND SCREEN
ABO/RH(D): A POS
Antibody Screen: NEGATIVE
Unit division: 0
Unit division: 0

## 2021-09-11 LAB — CBC
HCT: 25.6 % — ABNORMAL LOW (ref 39.0–52.0)
HCT: 25.6 % — ABNORMAL LOW (ref 39.0–52.0)
Hemoglobin: 8.3 g/dL — ABNORMAL LOW (ref 13.0–17.0)
Hemoglobin: 8.4 g/dL — ABNORMAL LOW (ref 13.0–17.0)
MCH: 29 pg (ref 26.0–34.0)
MCH: 29.6 pg (ref 26.0–34.0)
MCHC: 32.4 g/dL (ref 30.0–36.0)
MCHC: 32.8 g/dL (ref 30.0–36.0)
MCV: 89.5 fL (ref 80.0–100.0)
MCV: 90.1 fL (ref 80.0–100.0)
Platelets: 430 10*3/uL — ABNORMAL HIGH (ref 150–400)
Platelets: 429 10*3/uL — ABNORMAL HIGH (ref 150–400)
RBC: 2.86 MIL/uL — ABNORMAL LOW (ref 4.22–5.81)
RBC: 2.84 MIL/uL — ABNORMAL LOW (ref 4.22–5.81)
RDW: 16.4 % — ABNORMAL HIGH (ref 11.5–15.5)
RDW: 16.8 % — ABNORMAL HIGH (ref 11.5–15.5)
WBC: 8.4 10*3/uL (ref 4.0–10.5)
WBC: 9.7 10*3/uL (ref 4.0–10.5)
nRBC: 0 % (ref 0.0–0.2)
nRBC: 0.2 % (ref 0.0–0.2)

## 2021-09-11 LAB — BPAM RBC
Blood Product Expiration Date: 202305182359
Blood Product Expiration Date: 202305182359
ISSUE DATE / TIME: 202304251041
ISSUE DATE / TIME: 202304251255
Unit Type and Rh: 6200
Unit Type and Rh: 6200

## 2021-09-11 LAB — MAGNESIUM: Magnesium: 1.6 mg/dL — ABNORMAL LOW (ref 1.7–2.4)

## 2021-09-11 SURGERY — EGD (ESOPHAGOGASTRODUODENOSCOPY)
Anesthesia: Monitor Anesthesia Care

## 2021-09-11 MED ORDER — SODIUM CHLORIDE 0.9 % IV SOLN: INTRAVENOUS | Status: DC | PRN

## 2021-09-11 MED ORDER — FENTANYL CITRATE (PF) 100 MCG/2ML IJ SOLN
INTRAMUSCULAR | Status: AC
  Filled 2021-09-11: qty 2

## 2021-09-11 MED ORDER — LIDOCAINE HCL 1 % IJ SOLN
INTRAMUSCULAR | Status: AC
  Filled 2021-09-11: qty 20

## 2021-09-11 MED ORDER — IOHEXOL 300 MG/ML  SOLN: 100.0000 mL | Freq: Once | INTRAMUSCULAR | Status: DC | PRN

## 2021-09-11 MED ORDER — PROPOFOL 500 MG/50ML IV EMUL
INTRAVENOUS | Status: AC
  Filled 2021-09-11: qty 50

## 2021-09-11 MED ORDER — PROPOFOL 10 MG/ML IV BOLUS
INTRAVENOUS | Status: DC | PRN
  Administered 2021-09-11 (×2): 10 mg via INTRAVENOUS

## 2021-09-11 MED ORDER — MIDAZOLAM HCL 2 MG/2ML IJ SOLN
INTRAMUSCULAR | Status: AC
  Filled 2021-09-11: qty 2

## 2021-09-11 MED ORDER — HYDROCODONE-ACETAMINOPHEN 5-325 MG PO TABS
1.0000 | ORAL_TABLET | Freq: Four times a day (QID) | ORAL | Status: DC | PRN
  Administered 2021-09-11: 2 via ORAL
  Administered 2021-09-11: 1 via ORAL
  Filled 2021-09-11: qty 2
  Filled 2021-09-11: qty 1
  Filled 2021-09-11 (×2): qty 2

## 2021-09-11 MED ORDER — LIDOCAINE HCL 1 % IJ SOLN
INTRAMUSCULAR | Status: AC | PRN
  Administered 2021-09-11: 5 mL

## 2021-09-11 MED ORDER — METOPROLOL TARTRATE 5 MG/5ML IV SOLN
2.5000 mg | Freq: Four times a day (QID) | INTRAVENOUS | Status: DC
  Administered 2021-09-11 – 2021-09-12 (×3): 2.5 mg via INTRAVENOUS
  Filled 2021-09-11 (×3): qty 5

## 2021-09-11 MED ORDER — FENTANYL CITRATE (PF) 100 MCG/2ML IJ SOLN
INTRAMUSCULAR | Status: AC | PRN
  Administered 2021-09-11 (×2): 50 ug via INTRAVENOUS

## 2021-09-11 MED ORDER — MIDAZOLAM HCL 2 MG/2ML IJ SOLN
INTRAMUSCULAR | Status: AC | PRN
Start: 2021-09-11 — End: 2021-09-11
  Administered 2021-09-11: .5 mg via INTRAVENOUS
  Administered 2021-09-11: 1 mg via INTRAVENOUS

## 2021-09-11 MED ORDER — PROPOFOL 500 MG/50ML IV EMUL
INTRAVENOUS | Status: DC | PRN
  Administered 2021-09-11: 80 ug/kg/min via INTRAVENOUS

## 2021-09-11 MED ORDER — PHENYLEPHRINE 80 MCG/ML (10ML) SYRINGE FOR IV PUSH (FOR BLOOD PRESSURE SUPPORT)
PREFILLED_SYRINGE | INTRAVENOUS | Status: DC | PRN
  Administered 2021-09-11 (×2): 100 ug via INTRAVENOUS

## 2021-09-11 MED ORDER — MAGNESIUM SULFATE 4 GM/100ML IV SOLN
4.0000 g | Freq: Once | INTRAVENOUS | Status: AC
  Administered 2021-09-11: 4 g via INTRAVENOUS
  Filled 2021-09-11: qty 100

## 2021-09-11 NOTE — Assessment & Plan Note (Addendum)
?   Seen by GI and underwent EGD on 4/26 ?? Patient found to have bleeding duodenal ulcer on EGD, not amenable to endoscopic management ?? Interventional radiology consulted and performed visceral arteriogram with embolization ?? Protonix has been changed to twice a day ?

## 2021-09-11 NOTE — Progress Notes (Signed)
Chaplain engaged in an initial visit with Trevor Bailey and his sister.  Sister described Trevor Bailey as being her oldest brother and always being there to help her.  She voiced that they are very close and have been safe places for each other.  She voiced that Trevor Bailey often looked to her as a confidant.  Chaplain was able to learn about Trevor Bailey's multiple hospital stays.  While Trevor Bailey's sister desires for Trevor Bailey to be well she also uplifted that she wants God's will to be done.  ? ?Chaplain offered prayer with her and Trevor Bailey.  Trevor Bailey expressed being in some pain.  Chaplain also offered a blessing of rest to be upon him. Chaplain created relationship and rapport, engaged in narrative pastoral care, reflective listening and support.  ? ? ? 09/11/21 1300  ?Clinical Encounter Type  ?Visited With Patient and family together  ?Visit Type Initial;Spiritual support  ?Spiritual Encounters  ?Spiritual Needs Prayer;Emotional;Grief support  ? ? ?

## 2021-09-11 NOTE — Plan of Care (Signed)
?  Problem: Education: ?Goal: Knowledge of General Education information will improve ?Description: Including pain rating scale, medication(s)/side effects and non-pharmacologic comfort measures ?Outcome: Progressing ?  ?Problem: Nutrition: ?Goal: Adequate nutrition will be maintained ?Outcome: Progressing ?  ?Problem: Coping: ?Goal: Level of anxiety will decrease ?Outcome: Progressing ?  ?Problem: Elimination: ?Goal: Will not experience complications related to bowel motility ?Outcome: Progressing ?Goal: Will not experience complications related to urinary retention ?Outcome: Progressing ?  ?

## 2021-09-11 NOTE — Assessment & Plan Note (Signed)
?   Likely related to chronic disease/poor p.o. intake ?? Nutrition consult ?

## 2021-09-11 NOTE — Progress Notes (Signed)
?Progress Note ? ? ?Patient: Trevor Bailey NFA:[id number] DOB: 04-16-1946 DOA: 09/10/2021     0 ?DOS: the patient was seen and examined on 09/11/2021 ?  ?Brief hospital course: ?76 year old male with a history of non-small cell lung cancer stage IV, paroxysmal atrial fibrillation, recent admission for GI bleeding, readmitted to the hospital with worsening anemia secondary to GI bleed.  Seen by GI and underwent endoscopy where he was found that he had bleeding duodenal ulcer that was not amenable to endoscopic management.  Interventional radiology consulted for visceral arteriogram with embolization.  Palliative care following for goals of care. ? ?Assessment and Plan: ?* GI bleed ?Seen by GI and underwent EGD on 4/26 ?Patient found to have bleeding duodenal ulcer on EGD, not amenable to endoscopic management ?Interventional radiology consulted for visceral arteriogram and embolization ?Continue on Protonix infusion ? 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 Julien Nordmann, oncology informed of admission ?Recent CT neck redemonstrated large left supraclavicular and left posterior neck malignancy involving bones, muscles and lymph nodes ? ?Paroxysmal atrial fibrillation (HCC) ?Chronically on metoprolol for rate control ?Not on anticoagulation due to risk of bleeding, ongoing GI bleeding issues ?He was tachycardic earlier today ?Started on low-dose IV Lopressor for now. ? ? ?Hypoalbuminemia ?Likely related to chronic disease/poor p.o. intake ?Nutrition consult ? ?Hyponatremia ?Mild, secondary to hypovolemia ?Improved with IV fluids ? ?AKI (acute kidney injury) ([place]) ?Related to severe anemia and hypovolemia ?Creatinine 1.6 on admission ?This is trended down to 1.2 today with volume resuscitation ? ?Acute on chronic blood loss anemia ?Secondary to GI blood loss ?Hgb was 11.2 on last discharge 4/25 ?Hgb down to 5.3 on readmission 4/25 ?S/p 2 unit prbc ?Follow up hemoglobin  improved to 8.6 and has been stable ?Continue to follow ? ?Goals of care, counseling/discussion ?Palliative care following ?Patient has requested full code/full measures for now ?Patient would like to speak to oncology to further discuss prognosis ?In his current state, with multiple comorbidities, I feel that a DNR status would be reasonable.  Likely need to have further discussions around this. ? ?Controlled NIDDM-2 with hyperglycemia and other complications ?He is on metformin prior to discharge which is currently on hold ?Recent A1c 6.9 ?Continue on sliding scale insulin ? ?Essential hypertension ?Continue to hold ramipril ?Restarted on low-dose metoprolol for rate control, blood pressures currently stable ? ?OSA on CPAP ?Continue CPAP nightly ? ?CAD S/P percutaneous coronary angioplasty ?Remote history of stenting ?No chest pain at present ?We will avoid antiplatelets in light of GI bleeding ?Continue statin and metoprolol ? ? ? ? ?  ? ?Subjective: Reports that left shoulder/neck pain is better after receiving morphine.  Does not have any shortness of breath ? ?Physical Exam: ?Vitals:  ? 09/11/21 1605 09/11/21 1610 09/11/21 1615 09/11/21 1620  ?BP: 114/80 106/83 114/77 130/75  ?Pulse: (!) 102 (!) 105 99 97  ?Resp: 13 (!) 8 (!) 9 15  ?Temp:      ?TempSrc:      ?SpO2: 100% 99% 100% 100%  ?Weight:      ?Height:      ? ?General exam: Alert, awake, oriented x 3 ?Respiratory system: Coarse breath sounds that clear with coughing. Respiratory effort normal. ?Cardiovascular system:RRR. No murmurs, rubs, gallops. ?Gastrointestinal system: Abdomen is nondistended, soft and nontender. No organomegaly or masses felt. Normal bowel sounds heard. ?Central nervous system: Alert and oriented. No focal neurological deficits. ?Extremities: No C/C/E, +pedal pulses ?Skin: No rashes, lesions  or ulcers ?Psychiatry: Judgement and insight appear normal. Mood & affect appropriate.  ? ?Data Reviewed: ? ?CBC shows stable hemoglobin.   Creatinine improved from 1.6-1.2 ? ?Family Communication: Discussed with wife and sister at the bedside ? ?Disposition: ?Status is: Inpatient ?Remains inpatient appropriate because: Ongoing issues with GI bleeding requiring interventional radiology intervention ? Planned Discharge Destination: Home with Home Health ? ? ? ?Time spent: 35 minutes ? ?Author: ?Kathie Dike, MD ?09/11/2021 5:05 PM ? ?For on call review [email or web address].  ?

## 2021-09-11 NOTE — Op Note (Signed)
[facility] ?Patient Name: Trevor Bailey ?Procedure Date: 09/11/2021 ?MRN: [id number] ?Attending MD: Otis Brace , MD ?Date of Birth: 10/15/1945 ?CSN: [id number] ?Age: 76 ?Admit Type: Inpatient ?Procedure:                Upper GI endoscopy ?Indications:              Melena, Recent gastrointestinal bleeding ?Providers:                Otis Brace, MD, Carlyn Reichert, RN, Alphonzo Grieve  ?                          [name] Roach, Technician ?Referring MD:              ?Medicines:                 ?Complications:            No immediate complications. ?Estimated Blood Loss:     Estimated blood loss was minimal. ?Procedure:                Pre-Anesthesia Assessment: ?                          - Prior to the procedure, a History and Physical  ?                          was performed, and patient medications and  ?                          allergies were reviewed. The patient's tolerance of  ?                          previous anesthesia was also reviewed. The risks  ?                          and benefits of the procedure and the sedation  ?                          options and risks were discussed with the patient.  ?                          All questions were answered, and informed consent  ?                          was obtained. Prior Anticoagulants: The patient has  ?                          taken Eliquis (apixaban), last dose was 2 days  ?                          prior to procedure. ASA Grade Assessment: III - A  ?                          patient with severe systemic disease. After  ?  reviewing the risks and benefits, the patient was  ?                          deemed in satisfactory condition to undergo the  ?                          procedure. ?                          After obtaining informed consent, the endoscope was  ?                          passed under direct vision. Throughout the  ?                          procedure, the patient's blood pressure, pulse, and  ?                           oxygen saturations were monitored continuously. The  ?                          GIF-H190 ([id number]) Olympus endoscope was introduced  ?                          through the mouth, and advanced to the second part  ?                          of duodenum. The upper GI endoscopy was performed  ?                          with moderate difficulty. The patient tolerated the  ?                          procedure well. ?Scope In: ?Scope Out: ?Findings: ?     The Z-line was regular and was found 40 cm from the incisors. ?     Red blood was found in the entire examined stomach. ?     Clotted blood was found in the entire examined stomach. Blood clots were  ?     removed. No active bleeding seen in the stomach. ?     Biopsies were taken with a cold forceps for histology. ?     The cardia and gastric fundus were normal on retroflexion. ?     One non-obstructing cratered duodenal ulcer with pigmented material was  ?     found in the duodenal bulb. The lesion was 25 mm in largest dimension.  ?     There was a large blood clot on the duodenal bulb ulcer which was  ?     removed. There were multiple areas that appear to be source of bleeding  ?     within the ulcer base. I do not think multiple areas were amenable for  ?     endoscopic treatment.. Perforation of the bowel wall cannot be ruled out. ?Impression:               - Z-line regular, 40 cm from the incisors. ?                          -  Red blood in the entire stomach. ?                          - Clotted blood in the entire stomach. Biopsied. ?                          - Non-obstructing duodenal ulcer with pigmented  ?                          material. ?Moderate Sedation: ?     Moderate (conscious) sedation was personally administered by an  ?     anesthesia professional. The following parameters were monitored: oxygen  ?     saturation, heart rate, blood pressure, and response to care. ?Recommendation:           - Return patient to hospital ward for  ongoing care. ?                          - Clear liquid diet. ?                          - Continue present medications. ?                          - Await pathology results. ?                          - Perform a flat plate and upright abdominal x-ray  ?                          at appointment to be scheduled. ?Procedure Code(s):        --- Professional --- ?                          [phone], Esophagogastroduodenoscopy, flexible,  ?                          transoral; with biopsy, single or multiple ?Diagnosis Code(s):        --- Professional --- ?                          K92.2, Gastrointestinal hemorrhage, unspecified ?                          K26.9, Duodenal ulcer, unspecified as acute or  ?                          chronic, without hemorrhage or perforation ?                          K92.1, Melena (includes Hematochezia) ?CPT copyright 2019 American Medical Association. All rights reserved. ?The codes documented in this report are preliminary and upon coder review may  ?be revised to meet current compliance requirements. ?Otis Brace, MD ?Otis Brace, MD ?09/11/2021 12:15:56 PM ?Number of Addenda: 0 ?

## 2021-09-11 NOTE — Assessment & Plan Note (Addendum)
?   Secondary to GI blood loss ?? Hgb was 11.2 on last discharge 4/25 ?? Hgb down to 5.3 on readmission 4/25 ?? S/p 2 unit prbc ?? Follow up hemoglobin improved to 8.6 and has been stable ? ?

## 2021-09-11 NOTE — Anesthesia Preprocedure Evaluation (Addendum)
Anesthesia Evaluation  ?Patient identified by MRN, date of birth, ID band ?Patient awake ? ? ? ?Reviewed: ?Allergy & Precautions, NPO status , Patient's Chart, lab work & pertinent test results ? ?Airway ?Mallampati: II ? ?TM Distance: >3 FB ?Neck ROM: Full ? ? ? Dental ? ?(+) Dental Advisory Given, Partial Lower, Partial Upper ?  ?Pulmonary ?sleep apnea and Continuous Positive Airway Pressure Ventilation , COPD, former smoker,  ?Stage IV NSCLC with metastasis to left neck and shoulder and lymph nodes ?  ?Pulmonary exam normal ?breath sounds clear to auscultation ? ? ? ? ? ? Cardiovascular ?hypertension, Pt. on home beta blockers ?+ CAD, + Past MI, + Cardiac Stents and + Peripheral Vascular Disease  ?+ dysrhythmias Atrial Fibrillation  ?Rhythm:Regular Rate:Tachycardia ? ? ?  ?Neuro/Psych ?negative neurological ROS ? negative psych ROS  ? GI/Hepatic ?Neg liver ROS, GERD  Medicated,  ?Endo/Other  ?diabetes, Type 2, Oral Hypoglycemic Agents ? Renal/GU ?Renal disease (AKI)  ? ?  ?Musculoskeletal ?negative musculoskeletal ROS ?(+)  ? Abdominal ?  ?Peds ? Hematology ? ?(+) Blood dyscrasia, anemia ,   ?Anesthesia Other Findings ?Day of surgery medications reviewed with the patient. ? Reproductive/Obstetrics ? ?  ? ? ? ? ? ? ? ? ? ? ? ? ? ?  ?  ? ? ? ? ? ?Anesthesia Physical ?Anesthesia Plan ? ?ASA: 4 ? ?Anesthesia Plan: MAC  ? ?Post-op Pain Management: Minimal or no pain anticipated  ? ?Induction: Intravenous ? ?PONV Risk Score and Plan: 1 and TIVA ? ?Airway Management Planned: Natural Airway and Nasal Cannula ? ?Additional Equipment:  ? ?Intra-op Plan:  ? ?Post-operative Plan:  ? ?Informed Consent: I have reviewed the patients History and Physical, chart, labs and discussed the procedure including the risks, benefits and alternatives for the proposed anesthesia with the patient or authorized representative who has indicated his/her understanding and acceptance.  ? ? ? ?Dental advisory  given ? ?Plan Discussed with: CRNA ? ?Anesthesia Plan Comments:   ? ? ? ? ? ?Anesthesia Quick Evaluation ? ?

## 2021-09-11 NOTE — Assessment & Plan Note (Addendum)
?   Related to severe anemia and hypovolemia ?? Creatinine 1.6 on admission ?? This trended down to 1.2 with volume resuscitation ?

## 2021-09-11 NOTE — Procedures (Signed)
Pre-procedure Diagnosis: Upper GI bleeding d/t duodenal ulcer ?Post-procedure Diagnosis: Same ? ?Post mesenteric arteriogram and percutaneous embolization of the GDA.   ? ?Complications: None Immediate ? ?EBL: None ? ?Keep right leg straight for 4 hrs.   ? ?Signed: ?Sandi Mariscal ?Pager: [phone] ?09/11/2021, 5:36 PM ? ? ?

## 2021-09-11 NOTE — Assessment & Plan Note (Addendum)
?   Chronically on metoprolol for rate control ?? Not on anticoagulation due to risk of bleeding, ongoing GI bleeding issues ? ? ?

## 2021-09-11 NOTE — Assessment & Plan Note (Signed)
?   Mild, secondary to hypovolemia ?? Improved with IV fluids ?

## 2021-09-11 NOTE — Hospital Course (Addendum)
76 year old male with a history of non-small cell lung cancer stage IV, paroxysmal atrial fibrillation, recent admission for GI bleeding, readmitted to the hospital with worsening anemia secondary to GI bleed.  Seen by GI and underwent endoscopy where he was found that he had bleeding duodenal ulcer that was not amenable to endoscopic management.  Interventional radiology consulted for visceral arteriogram with embolization.  Palliative care following for goals of care. After discussing with oncology and palliative care, patient and family elected to transition to comfort care. Since then he has developed significant agitation and delirium. ?

## 2021-09-11 NOTE — Progress Notes (Signed)
[facility] Progress Note ? ?Trevor Bailey 76 y.o. [birth year] ? ?CC:   GI bleed ? ?Subjective: ?Patient seen and examined at bedside.  Denies any further bleeding episodes. ? ?ROS : Afebrile.  Negative for chest pain.  Positive for weakness. ? ? ?Objective: ?Vital signs in last 24 hours: ?Vitals:  ? 09/11/21 1000 09/11/21 1057  ?BP: 131/73 139/64  ?Pulse: (!) 119 (!) 112  ?Resp: (!) 21 12  ?Temp:  97.7 ?F (36.5 ?C)  ?SpO2: 99% 95%  ? ? ?Physical Exam: ? ?General -resting comfortably.  Not in acute distress. ?Abdomen -soft, nontender, nondistended, bowel sounds present.  No peritoneal signs ?Neuro -alert and oriented x3 ?Psych -and affect normal. ? ?Lab Results: ?Recent Labs  ?  09/10/21 ?0330 09/11/21 ?0249  ?NA 134* 135  ?K 5.1 4.1  ?CL 103 108  ?CO2 20* 19*  ?GLUCOSE 196* 148*  ?BUN 49* 37*  ?CREATININE 1.68* 1.26*  ?CALCIUM 8.6* 8.0*  ?MG  --  1.6*  ? ?Recent Labs  ?  09/10/21 ?0330 09/11/21 ?0249  ?AST 21 12*  ?ALT 17 11  ?ALKPHOS 96 83  ?BILITOT 0.5 0.5  ?PROT 5.5* 4.9*  ?ALBUMIN 1.8* 1.8*  ? ?Recent Labs  ?  09/10/21 ?0330 09/10/21 ?1045 09/10/21 ?1706 09/11/21 ?0249  ?WBC 9.7 8.3  --  8.4  ?NEUTROABS 8.2*  --   --   --   ?HGB 7.0* 5.3* 8.6* 8.3*  ?HCT 22.5* 16.7* 26.1* 25.6*  ?MCV 93.0 93.3  --  89.5  ?PLT 604* 606*  --  430*  ? ?Recent Labs  ?  09/10/21 ?1011  ?LABPROT 13.8  ?INR 1.1  ? ? ? ? ?Assessment/Plan: ?Assessment ?------------------- ?-Anemia with hemoglobin of 5.3 yesterday.  Hemoglobin improved to 8.6 after blood transfusion patient denies any overt bleeding in last few days.  GI earlier this month showed large duodenal bulb ulcer which was not amenable for endoscopic treatment at that time. ?-History of atrial fibrillation -not sure of last dose of Eliquis. ?-History of stage IV non-small cell lung cancer ?  ?Recommendations ?------------------------ ?-Proceed with EGD today. ?-Continue IV PPI ?-Continue to hold anticoagulation ? ?Risks (bleeding, infection, bowel perforation that could  require surgery, sedation-related changes in cardiopulmonary systems), benefits (identification and possible treatment of source of symptoms, exclusion of certain causes of symptoms), and alternatives (watchful waiting, radiographic imaging studies, empiric medical treatment)  were explained to patient/family in detail and patient wishes to proceed.  ? ? ?Otis Brace MD, FACP ?09/11/2021, 11:06 AM ? ?Contact #  [phone]  ?

## 2021-09-11 NOTE — Assessment & Plan Note (Addendum)
?   He is on metformin prior to discharge which is currently on hold ?? Recent A1c 6.9 ?? Sliding scale insulin discontinued based on goals of care ?

## 2021-09-11 NOTE — Assessment & Plan Note (Addendum)
?   Follows with Dr. Julien Nordmann ?? Recent CT neck redemonstrated large left supraclavicular and left posterior neck malignancy involving bones, muscles and lymph nodes ?? Not a candidate for further systemic chemotherapy ?? Oncology recommendations to pursue palliative care/hospice ?

## 2021-09-11 NOTE — Assessment & Plan Note (Addendum)
?   Seen by oncology and it was felt that he would not be a candidate for further systemic chemotherapy.  Recommendations were for palliative/hospice care ?? Patient seen by palliative care service and discussed goals of care with patient and family ?? They have elected to pursue hospice care with goal of therapy to be patient's comfort ?? Wife is hopeful for residential hospice placement ?? Discussed with Dr. Rowe Pavy who also agrees with residential hospice placement ?

## 2021-09-11 NOTE — Anesthesia Postprocedure Evaluation (Signed)
Anesthesia Post Note ? ?Patient: Trevor Bailey ? ?Procedure(s) Performed: ESOPHAGOGASTRODUODENOSCOPY (EGD) ?BIOPSY ? ?  ? ?Patient location during evaluation: Endoscopy ?Anesthesia Type: MAC ?Level of consciousness: oriented, awake and alert and awake ?Pain management: pain level controlled ?Vital Signs Assessment: post-procedure vital signs reviewed and stable ?Respiratory status: spontaneous breathing, nonlabored ventilation and respiratory function stable ?Cardiovascular status: blood pressure returned to baseline and stable ?Postop Assessment: no headache, no backache and no apparent nausea or vomiting ?Anesthetic complications: no ? ? ?No notable events documented. ? ?Last Vitals:  ?Vitals:  ? 09/11/21 1300 09/11/21 1505  ?BP:  107/79  ?Pulse: 94 100  ?Resp: 15 13  ?Temp:    ?SpO2: 100% 97%  ?  ?Last Pain:  ?Vitals:  ? 09/11/21 1505  ?TempSrc:   ?PainSc: 0-No pain  ? ? ?  ?  ?  ?  ?  ?  ? ?[place] ? ? ? ? ?

## 2021-09-11 NOTE — Assessment & Plan Note (Addendum)
He had been refusing cpap ?-discontinued cpap based on goals of care ?

## 2021-09-11 NOTE — Assessment & Plan Note (Addendum)
?   Chronically on ramipril and metoprolol as an outpatient ?? Discontinued based on goals of care ?

## 2021-09-11 NOTE — Transfer of Care (Signed)
Immediate Anesthesia Transfer of Care Note ? ?Patient: Trevor Bailey ? ?Procedure(s) Performed: ESOPHAGOGASTRODUODENOSCOPY (EGD) ?BIOPSY ? ?Patient Location: PACU ? ?Anesthesia Type:MAC ? ?Level of Consciousness: awake, alert , oriented and patient cooperative ? ?Airway & Oxygen Therapy: Patient Spontanous Breathing ? ?Post-op Assessment: Report given to RN and Post -op Vital signs reviewed and stable ? ?Post vital signs: Reviewed and stable ? ?Last Vitals:  ?Vitals Value Taken Time  ?BP 98/62 09/11/21 1203  ?Temp 36.4 ?C 09/11/21 1202  ?Pulse 96 09/11/21 1205  ?Resp 12 09/11/21 1205  ?SpO2 97 % 09/11/21 1205  ?Vitals shown include unvalidated device data. ? ?Last Pain:  ?Vitals:  ? 09/11/21 1202  ?TempSrc: Tympanic  ?PainSc: Asleep  ?   ? ?  ? ?Complications: No notable events documented. ?

## 2021-09-11 NOTE — Progress Notes (Signed)
PT Cancellation Note ? ?Patient Details ?Name: Trevor Bailey ?MRN: [id number] ?DOB: 09/01/1945 ? ? ?Cancelled Treatment:    Reason Eval/Treat Not Completed: Patient at procedure or test/unavailable (Pt in EGD. Will return as able.) ? ? ?Alvira Philips ?09/11/2021, 12:32 PM ?Arrie Aran M,PT ?Acute Rehab Services ?[phone] ?[phone] (pager)  ?

## 2021-09-11 NOTE — Progress Notes (Signed)
Patient declines the use of nocturnal CPAP tonight.  ?

## 2021-09-11 NOTE — Assessment & Plan Note (Addendum)
?   Remote history of stenting ?? No chest pain at present ?? We will avoid antiplatelets in light of GI bleeding ? ?

## 2021-09-11 NOTE — Progress Notes (Signed)
Patient ID: ESKER DEVER, male   DOB: 26-May-1945, 76 y.o.   MRN: [id number] ? ? ? ?Progress Note from the Palliative Medicine Team at [facility] ? ? ?Patient Name: Shaheem Pichon Bracco        ?Date: 09/11/2021 ?DOB: 12/17/1945  Age: 76 y.o. MRN#: [id number] ?Attending Physician: Kathie Dike, MD ?Primary Care Physician: [name], MD ?Admit Date: 09/10/2021 ? ? ?Medical records reviewed  ? ?76 y.o. 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 Protonix twice daily  ? ?Admitted for treatment and stabilization.  EGD scheduled for today. ? ? ?This NP visited patient at the bedside as a follow up to  yesterday's [place] for palliative medicine needs and emotional support. ? ?Patient is generally uncomfortable, continues to state that comfort and dignity are his main focus at this time. ? ?Continued education offered on patient's current medical situation specific to his multiple co-morbidities.   ? ?Education offered on the difference between aggressive medical intervention path and a palliative comfort path for this patient, at this time, in this situation was offered. ? ?Education offered on hospice services;    philosophy and eligibility.   ? ? ?Plan of Care ?-Full code ?        -Educated/encouraged patient to consider DNR/DNI status understanding evidenced based poor outcomes in similar hospitalized patient, as the cause of arrest is likely associated with advanced chronic illness rather than an easily reversible acute cardio-pulmonary event.  ?-Upper endoscopy today ?-Patient hopes for oncology to weigh in offering him some direction regarding treatment plan ?-Continue  to treat the treatable hoping for improvement ? ? ?Discussed with patient the importance of continued conversation with family and their  medical providers regarding overall plan of care and treatment options,  ensuring decisions are within the context of the patients values and GOCs. ? ?Questions and concerns addressed    ? ? ?Wadie Lessen NP  ?Palliative Medicine Team Team Phone # [phone] ?Pager [phone] ?  ?

## 2021-09-12 ENCOUNTER — Encounter (HOSPITAL_COMMUNITY): Payer: Self-pay | Admitting: Gastroenterology

## 2021-09-12 DIAGNOSIS — K922 Gastrointestinal hemorrhage, unspecified: Secondary | ICD-10-CM | POA: Diagnosis not present

## 2021-09-12 DIAGNOSIS — Z515 Encounter for palliative care: Secondary | ICD-10-CM | POA: Diagnosis not present

## 2021-09-12 DIAGNOSIS — R410 Disorientation, unspecified: Secondary | ICD-10-CM | POA: Diagnosis not present

## 2021-09-12 DIAGNOSIS — G893 Neoplasm related pain (acute) (chronic): Secondary | ICD-10-CM | POA: Diagnosis not present

## 2021-09-12 DIAGNOSIS — C3412 Malignant neoplasm of upper lobe, left bronchus or lung: Secondary | ICD-10-CM | POA: Diagnosis not present

## 2021-09-12 LAB — GLUCOSE, CAPILLARY
Glucose-Capillary: 142 mg/dL — ABNORMAL HIGH (ref 70–99)
Glucose-Capillary: 134 mg/dL — ABNORMAL HIGH (ref 70–99)

## 2021-09-12 LAB — SURGICAL PATHOLOGY

## 2021-09-12 MED ORDER — HALOPERIDOL LACTATE 5 MG/ML IJ SOLN: 5.0000 mg | Freq: Four times a day (QID) | INTRAMUSCULAR | Status: DC | PRN

## 2021-09-12 MED ORDER — ZIPRASIDONE MESYLATE 20 MG IM SOLR
20.0000 mg | Freq: Once | INTRAMUSCULAR | Status: AC
  Administered 2021-09-12: 20 mg via INTRAMUSCULAR
  Filled 2021-09-12: qty 20

## 2021-09-12 MED ORDER — PANTOPRAZOLE SODIUM 40 MG IV SOLR
40.0000 mg | Freq: Two times a day (BID) | INTRAVENOUS | Status: DC
Start: 2021-09-12 — End: 2021-09-14
  Administered 2021-09-12 – 2021-09-14 (×5): 40 mg via INTRAVENOUS
  Filled 2021-09-12 (×5): qty 10

## 2021-09-12 MED ORDER — LORAZEPAM 2 MG/ML IJ SOLN
1.0000 mg | INTRAMUSCULAR | Status: DC | PRN
  Administered 2021-09-12 – 2021-09-13 (×4): 1 mg via INTRAVENOUS
  Filled 2021-09-12 (×5): qty 1

## 2021-09-12 MED ORDER — METOPROLOL TARTRATE 25 MG PO TABS: 25.0000 mg | ORAL_TABLET | Freq: Two times a day (BID) | ORAL | Status: DC

## 2021-09-12 MED ORDER — MORPHINE SULFATE (PF) 2 MG/ML IV SOLN
2.0000 mg | INTRAVENOUS | Status: DC | PRN
  Administered 2021-09-12 – 2021-09-14 (×5): 2 mg via INTRAVENOUS
  Filled 2021-09-12 (×6): qty 1

## 2021-09-12 MED ORDER — STERILE WATER FOR INJECTION IJ SOLN
INTRAMUSCULAR | Status: AC
  Filled 2021-09-12: qty 10

## 2021-09-12 MED ORDER — QUETIAPINE FUMARATE 50 MG PO TABS
50.0000 mg | ORAL_TABLET | Freq: Every day | ORAL | Status: DC
  Administered 2021-09-12: 50 mg via ORAL
  Filled 2021-09-12: qty 1

## 2021-09-12 NOTE — Progress Notes (Signed)
Patient presents as confused and agitated. Patient stated he wants to take all of his patient care equipment off and he will not keep his IV in. Patient not redirectable and needs constant reassurance.  ?

## 2021-09-12 NOTE — Progress Notes (Signed)
Sent Dr. Roderic Palau secure chat and let him know that patient has pulled his IV out and refuses for nurse to start another one and that there is no longer a sitter available. MD asked RN to try to get another IV if he will agree.  ?

## 2021-09-12 NOTE — Progress Notes (Signed)
Patient agitated and refusing to cooperate with nurses and palliative care NP [facility]. Dr. Roderic Palau called to room and at bedside talking to patient. Patient stating that hospital staff have kidnapped him and he wants to call 911. Patients wife was at bedside but has now left to go home. Patient insisting he wants to go home. Palliative care just met with patient and his wife and patient is transitioned to comfort care. Patient paranoid and refusing to cooperate with staff.  ?

## 2021-09-12 NOTE — Progress Notes (Signed)
Patient pulled his IV out and peed in the floor. RN wiped urine up from the floor and asked patient if she could change his socks and gown and patient refused. Patient making racial slurs towards RN. There is no sitter at the bedside at this time because [place], NT was pulled to TCU. Per unit manager there is not another sitter to sit with patient. Patient sitting up in bedside chair.  ?

## 2021-09-12 NOTE — Progress Notes (Signed)
Patient complains of pain but refusing pain medication when offered. Offered to reposition patient and he refuses both times offered recently. Wife and family at bedside.  ?

## 2021-09-12 NOTE — Assessment & Plan Note (Addendum)
?   Patient had progressive paranoia and delusions.  ?? Suspect that his change in mental status may be related to hospital delirium. No evidence of infection or other metabolic issues ?? He has required multiple doses of ativan and haldol for agitation ?? patient lacked insight to into his medical conditions, was clearly delusional and lacked capacity to make informed decisions around his care/disposition ?? Today he appears to be more calm and able to discuss his medical issues ?? Continue scheduled haldol and as needed ativan ?? Will continue monitor mental status for agitation ?

## 2021-09-12 NOTE — Progress Notes (Signed)
Patient much more cooperative while niece visiting. Agreeable to take oral Seroquel while she was in the room. Ate 25 % of dinner tray as well.  ?

## 2021-09-12 NOTE — Progress Notes (Signed)
Patient ID: JUBAL RADEMAKER, male   DOB: 1946-05-07, 76 y.o.   MRN: [id number] ? ? ? ?Progress Note from the Palliative Medicine Team at [facility] ? ? ?Patient Name: Demico Ploch Guirguis        ?Date: 09/12/2021 ?DOB: 23-Jun-1945  Age: 76 y.o. MRN#: [id number] ?Attending Physician: Kathie Dike, MD ?Primary Care Physician: [name], MD ?Admit Date: 09/10/2021 ? ? ?Medical records reviewed  ? ?76 y.o. 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 Protonix twice daily  ? ?Admitted for treatment and stabilization.  EGD yesterday  ? ?This NP visited patient at the bedside as a follow up to  yesterday's [place] for palliative medicine needs and emotional support.  [phone]) ? ?Patient is extremely lethargic, voice is garbled and complains of pain in left shoulder area.   Was present at bedside for Dr. Lew Dawes visit recommending a comfort path and hospice.  Patient is able to communicate agreement with this plan. ? ?       I spoke with wife by telephone and plan to meet her today at noon.  DNR/DNI placed ? ?(1200-1230) ? ?Wife and patient's sister at bedside , continued education offered on patient's current medical situation specific to his multiple co-morbidities.  Discussed Dr. Lew Dawes recommendation for comfort, all family members in agreement. ? ?Education offered on the difference between aggressive medical intervention path and a palliative comfort path for this patient, at this time, in this situation was offered. ? ?Education offered on hospice services;    philosophy and eligibility.  Education offered on hospice benefit both in the home versus  residential. ? ?Plan of Care ?-DNR/DNI- documented today  ?-No artificial feeding or hydration now or in the future ?-No further diagnostic, Lab draws finger sticks ?-Minimize medications utilizing those to enhance comfort only ?     Symptom management: ?            -Morphine 2 mg IV every 1 hour as needed for pain or dyspnea ?            -Ativan 1 mg IV every 4 hours as needed for agitation ?-MOST form completed to reflect full comfort ?-Reevaluate in 24 hours for appropriate transition of care.  Patient's wife is hopeful for residential hospice. ? ? ?Questions and concerns addressed    ? ? ?Wadie Lessen NP  ?Palliative Medicine Team Team Phone # [phone] ?Pager [phone] ?  ?

## 2021-09-12 NOTE — Progress Notes (Signed)
? ? ?  OVERNIGHT PROGRESS REPORT ? ?Notified by RN for continued confusion and accusing  speech to staff. ?Patient is able to state name, and roughly the location, occasionally his problem of cancer but not current situation or time/date. ? ?Patient has removed devices and IV's multiple times tonight. ? ?Patient was argumentative with attempts to reorient him to time/place/ situation. ?He requested to call his wife, which was done and he did speak with her per ICU charge RN. ? ?Some minutes later he was more calm and remains so at the time of this note/bedside visit. ? ?No medications were used at the time of this note. ? ? ?Gershon Cull MSNA MSN ACNPC-AG ?Acute Care Nurse Practitioner ?[facility] ?[place] ? ? ? ?

## 2021-09-12 NOTE — Progress Notes (Signed)
?Progress Note ? ? ?Patient: Trevor Bailey PRF:[id number] DOB: [birth year] DOA: 09/10/2021     1 ?DOS: the patient was seen and examined on 09/12/2021 ?  ?Brief hospital course: ?76 year old male with a history of non-small cell lung cancer stage IV, paroxysmal atrial fibrillation, recent admission for GI bleeding, readmitted to the hospital with worsening anemia secondary to GI bleed.  Seen by GI and underwent endoscopy where he was found that he had bleeding duodenal ulcer that was not amenable to endoscopic management.  Interventional radiology consulted for visceral arteriogram with embolization.  Palliative care following for goals of care. ? ?Assessment and Plan: ?* GI bleed ?Seen by GI and underwent EGD on 4/26 ?Patient found to have bleeding duodenal ulcer on EGD, not amenable to endoscopic management ?Interventional radiology consulted and performed visceral arteriogram with embolization ?Protonix has been changed to twice a day ? 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 Julien Nordmann ?Recent CT neck redemonstrated large left supraclavicular and left posterior neck malignancy involving bones, muscles and lymph nodes ?Not a candidate for further systemic chemotherapy ? ?Paroxysmal atrial fibrillation (HCC) ?Chronically on metoprolol for rate control ?Not on anticoagulation due to risk of bleeding, ongoing GI bleeding issues ? ? ? ?Delirium ?Patient has had progressive paranoia and delusions.  ?Suspect that his change in mental status may be related to hospital delirium. No evidence of infection or other metabolic issues ?He has currently pulled out his IV ?He has been started on seroquel, which he took today ?Currently, He lacks insight to into his medical conditions, is clearly delusional and lacks capacity to make informed decisions around his care/disposition ?If IV can be replaced, can consider starting haldol 5mg  BID and use ativan prn.  ?If patient does not  allow IV to be replaced and remains agitated with risk of self harm, we could use geodon IM  ? ?Hypoalbuminemia ?Likely related to chronic disease/poor p.o. intake ?Nutrition consult ? ?Hyponatremia ?Mild, secondary to hypovolemia ?Improved with IV fluids ? ?AKI (acute kidney injury) ([place]) ?Related to severe anemia and hypovolemia ?Creatinine 1.6 on admission ?This trended down to 1.2 with volume resuscitation ? ?Acute on chronic blood loss anemia ?Secondary to GI blood loss ?Hgb was 11.2 on last discharge 4/25 ?Hgb down to 5.3 on readmission 4/25 ?S/p 2 unit prbc ?Follow up hemoglobin improved to 8.6 and has been stable ? ? ?Goals of care, counseling/discussion ?Seen by oncology and it was felt that he would not be a candidate for further systemic chemotherapy.  Recommendations were for palliative/hospice care ?Patient seen by palliative care service and discussed goals of care with patient and family ?They have elected to pursue hospice care with goal of therapy to be patient's comfort ? 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 ?Essential hypertension ?Chronically on ramipril and metoprolol as an outpatient ? ?OSA on CPAP ?Continue CPAP nightly ? ?CAD S/P percutaneous coronary angioplasty ?Remote history of stenting ?No chest pain at present ?We will avoid antiplatelets in light of GI bleeding ? ? ? ? ? ?  ? ?Subjective: Patient was somnolent this morning on initial visit.  He had received multiple doses of morphine overnight.  During the day, mental status became more alert, but he became increasingly agitated and paranoid.  He was repeatedly saying "you guys have kidnapped me and  are keeping me here against my will".  He also demanded to speak to the [facility].  He was unable to tell me why he was admitted to the hospital, what procedures he had  done yesterday, he was unaware that his sister was in the room earlier, did not remember his primary care physician who he saw earlier this month and has a longstanding relationship with. ? ?Physical Exam: ?Vitals:  ? 09/12/21 0900 09/12/21 1000 09/12/21 1100 09/12/21 1200  ?BP: 124/80 119/66 121/79 117/63  ?Pulse: (!) 102 89 (!) 104 97  ?Resp: 17 16 (!) 23 14  ?Temp:      ?TempSrc:      ?SpO2: 97% 97% 100% 97%  ?Weight:      ?Height:      ? ?General exam: sitting up in chair, awake, agitated ?Respiratory system: Clear to auscultation. Respiratory effort normal. ?Cardiovascular system: Regular, tachycardic. No murmurs, rubs, gallops. ?Gastrointestinal system: Abdomen is nondistended, soft and nontender. No organomegaly or masses felt. Normal bowel sounds heard. ?Central nervous system: No focal neurological deficits. ?Extremities: No C/C/E, +pedal pulses ?Skin: No rashes, lesions or ulcers ?Psychiatry: paranoid and delusional.  Lacks insight into his overall medical problems/condition..  ? ? ?Family Communication: Discussed with wife and sister at the bedside ? ?Disposition: ?Status is: Inpatient ?Remains inpatient appropriate because: Ongoing issues with GI bleeding requiring interventional radiology intervention ? Planned Discharge Destination: Home with Home Health ? ? ? ?Time spent: 35 minutes ? ?Author: ?Kathie Dike, MD ?09/12/2021 6:34 PM ? ?For on call review [email or web address].  ?

## 2021-09-12 NOTE — Progress Notes (Signed)
CHART NOTE ?I saw the patient today.  Unfortunately his condition continues to decline with worsening tumor in the neck area as well as frequent gastrointestinal hemorrhage.  I discussed with the patient consideration of palliative care and hospice at this point.  I do not think the patient will be a good candidate for any additional systemic chemotherapy in the future. ?The patient was seen by the palliative care team and he is in agreement to proceeding with comfort care at this point. ?Thank you all for the good care you provided to Trevor Bailey. ?

## 2021-09-12 NOTE — Progress Notes (Signed)
Patient has been confused all night to the point of anger and accusing staff of holding him against his will. Patient able to state name, location, but not situation or time. Patient states he is here because of cancer, but unable to recall bleeding ulcer, EGD, or pulling out four IV sites overnight. Called patient's wife to inform her of his concerns and handed him the phone to talk to her. Patient began arguing with wife demanding she call the police. Patient's heart rate coincidently jumped up to 120-130's. On call provider Gershon Cull, NP aware. ?

## 2021-09-12 NOTE — Progress Notes (Signed)
Trevor Bailey Progress Note ? ?Trevor Bailey 76 y.o. 10-May-1946 ? ?CC: Duodenal ulcer, GI bleed ? ? ?Subjective: ?Patient seen and examined lying comfortably in bed.  He is status post GDA embolization and EGD 09/12/2021. ? ?EGD significant for red and clotted blood in the entire stomach as well as a large non-obstructing duodenal ulcer. ? ?Denies any bowel movements today.  Denies abdominal pain. ? ? ?ROS : Review of Systems  ?Constitutional:  Negative for chills and fever.  ?Gastrointestinal:  Negative for abdominal pain, blood in stool, constipation, diarrhea, heartburn, melena, nausea and vomiting.  ?Genitourinary:  Negative for dysuria and frequency.  ? ? ? ?Objective: ?Vital signs in last 24 hours: ?Vitals:  ? 09/12/21 0800 09/12/21 0900  ?BP: (!) 137/93 124/80  ?Pulse: (!) 114 (!) 102  ?Resp: (!) 26 17  ?Temp: 99.6 ?F (37.6 ?C)   ?SpO2: 97% 97%  ? ? ?Physical Exam: ? ?General:  Alert, cooperative, no distress, appears stated age  ?Head:  Normocephalic, without obvious abnormality, atraumatic  ?Eyes:  Anicteric sclera, EOM's intact  ?Lungs:   Clear to auscultation bilaterally, respirations unlabored  ?Heart:  Regular rate and rhythm, S1, S2 normal  ?Abdomen:   Soft, mild right lower quadrant tenderness, bowel sounds active all four quadrants,  no masses,   ?Extremities: Extremities normal, atraumatic, no  edema  ?Pulses: 2+ and symmetric  ? ? ?Lab Results: ?Recent Labs  ?  09/10/21 ?0330 09/11/21 ?0249  ?NA 134* 135  ?K 5.1 4.1  ?CL 103 108  ?CO2 20* 19*  ?GLUCOSE 196* 148*  ?BUN 49* 37*  ?CREATININE 1.68* 1.26*  ?CALCIUM 8.6* 8.0*  ?MG  --  1.6*  ? ?Recent Labs  ?  09/10/21 ?0330 09/11/21 ?0249  ?AST 21 12*  ?ALT 17 11  ?ALKPHOS 96 83  ?BILITOT 0.5 0.5  ?PROT 5.5* 4.9*  ?ALBUMIN 1.8* 1.8*  ? ?Recent Labs  ?  09/10/21 ?0330 09/10/21 ?1045 09/11/21 ?0249 09/11/21 ?1401  ?WBC 9.7   < > 8.4 9.7  ?NEUTROABS 8.2*  --   --   --   ?HGB 7.0*   < > 8.3* 8.4*  ?HCT 22.5*   < > 25.6* 25.6*  ?MCV 93.0   < > 89.5  90.1  ?PLT 604*   < > 430* 429*  ? < > = values in this interval not displayed.  ? ?Recent Labs  ?  09/10/21 ?1011  ?LABPROT 13.8  ?INR 1.1  ? ? ? ? ?Assessment ?Anemia ?Melena ?Large duodenal ulcer s/p GDA embolization ? ?Hgb 8.4 4/26, waiting todays labs. He has had 2 units pRBC this admission.  ? ?EGD significant for red and clotted blood in the entire stomach as well as a large non-obstructing duodenal ulcer. IR was abl to preform GDA embolization will continue to trend Hgb.  ? ? ?-History of atrial fibrillation -not sure of last dose of Eliquis. ?-History of stage IV non-small cell lung cancer ? ? ?Plan: ?Continue supportive care ?Ideally will recheck CBC and CMP today to evaluate for further anemia or signs of continuing GI bleed.  At this time per nurse report patient is declining further labs.  Palliative to talk to patient and wife today.   ?If bleeding persists IR recommended repeat endoscopy or surgical evaluation. ?Continue Protonix 40mg  IV twice daily while in the hospital, after discharge continue Protonix 40 mg po twice daily for 8 weeks. ?[facility] will sign off.  ? ? ?Charlott Rakes PA-C ?09/12/2021, 9:21 AM ? ?  Contact #  [phone]  ?

## 2021-09-12 NOTE — Progress Notes (Signed)
NTS'd pt. No secretions were removed. Pt was aggitated. Pts sats are 98% on RA and he is in no distress.  ?

## 2021-09-12 NOTE — Progress Notes (Signed)
Resting at this time in recliner chair. Marinus Maw, NT sitter at bedside. Discussed with oncoming RN Angelique, need for new IV access if possible.  ?

## 2021-09-12 NOTE — Progress Notes (Signed)
Patient will no longer communicate with nursing staff or charge nurse. Patient sitting silent in the room. Vitals normal.  ?
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
## 2021-09-13 DIAGNOSIS — Z7189 Other specified counseling: Secondary | ICD-10-CM

## 2021-09-13 MED ORDER — HALOPERIDOL LACTATE 5 MG/ML IJ SOLN
5.0000 mg | Freq: Two times a day (BID) | INTRAMUSCULAR | Status: DC
  Administered 2021-09-13 – 2021-09-14 (×3): 5 mg via INTRAVENOUS
  Filled 2021-09-13 (×3): qty 1

## 2021-09-13 NOTE — Progress Notes (Signed)
Patient ID: Trevor Bailey, male   DOB: 18-May-1946, 76 y.o.   MRN: [id number] ? ? ? ?Progress Note from the Palliative Medicine Team at [facility] ? ? ?Patient Name: Trevor Bailey        ?Date: 09/13/2021 ?DOB: 1945-07-11  Age: 76 y.o. MRN#: [id number] ?Attending Physician: Kathie Dike, MD ?Primary Care Physician: [name], MD ?Admit Date: 09/10/2021 ? ? ?Medical records reviewed  ? ?76 y.o. 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 Protonix twice daily  ? ?Admitted for treatment and stabilization.  EGD done.  ? ?  ?Patient is now out of the ICU, resting in bed, not awake not alert, wife and other family members at bedside. Patient mumbles and asks for water.   ? ? ?Plan of Care ?Continue comfort measures.  ?-DNR/DNI-   ?-No artificial feeding or hydration now or in the future ?-No further diagnostic, Lab draws finger sticks ?-Minimize medications utilizing those to enhance comfort only ?     Symptom management: ?            -Morphine 2 mg IV every 1 hour as needed for pain or dyspnea ?            -Ativan 1 mg IV every 4 hours as needed for agitation ?-   Patient's wife is hopeful for residential hospice. Continue symptom management. Oral care.  ? ? ?Questions and concerns addressed    ? ? ?Loistine Chance MD ?Palliative Medicine Team Team Phone # [phone] ?  ?  ?

## 2021-09-13 NOTE — Progress Notes (Signed)
?Progress Note ? ? ?Patient: Trevor Bailey WUJ:[id number] DOB: 08-23-1945 DOA: 09/10/2021     2 ?DOS: the patient was seen and examined on 09/13/2021 ?  ?Brief hospital course: ?76 year old male with a history of non-small cell lung cancer stage IV, paroxysmal atrial fibrillation, recent admission for GI bleeding, readmitted to the hospital with worsening anemia secondary to GI bleed.  Seen by GI and underwent endoscopy where he was found that he had bleeding duodenal ulcer that was not amenable to endoscopic management.  Interventional radiology consulted for visceral arteriogram with embolization.  Palliative care following for goals of care. After discussing with oncology and palliative care, patient and family elected to transition to comfort care. Since then he has developed significant agitation and delirium. ? ?Assessment and Plan: ?* GI bleed ?Seen by GI and underwent EGD on 4/26 ?Patient found to have bleeding duodenal ulcer on EGD, not amenable to endoscopic management ?Interventional radiology consulted and performed visceral arteriogram with embolization ?Protonix has been changed to twice a day ? 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 Julien Nordmann ?Recent CT neck redemonstrated large left supraclavicular and left posterior neck malignancy involving bones, muscles and lymph nodes ?Not a candidate for further systemic chemotherapy ?Oncology recommendations to pursue palliative care/hospice ? ?Paroxysmal atrial fibrillation (HCC) ?Chronically on metoprolol for rate control ?Not on anticoagulation due to risk of bleeding, ongoing GI bleeding issues ? ? ? ?Delirium ?Patient had progressive paranoia and delusions.  ?Suspect that his change in mental status may be related to hospital delirium. No evidence of infection or other metabolic issues ?He has required multiple doses of ativan and haldol for agitation ?patient lacked insight to into his medical conditions,  was clearly delusional and lacked capacity to make informed decisions around his care/disposition ?Today he is more somnolent, likely related to sedatives ?Continue scheduled haldol and as needed ativan ?Will continue monitor mental status for agitation ? ?Hypoalbuminemia ?Likely related to chronic disease/poor p.o. intake ?Nutrition consult ? ?Hyponatremia ?Mild, secondary to hypovolemia ?Improved with IV fluids ? ?AKI (acute kidney injury) ([place]) ?Related to severe anemia and hypovolemia ?Creatinine 1.6 on admission ?This trended down to 1.2 with volume resuscitation ? ?Acute on chronic blood loss anemia ?Secondary to GI blood loss ?Hgb was 11.2 on last discharge 4/25 ?Hgb down to 5.3 on readmission 4/25 ?S/p 2 unit prbc ?Follow up hemoglobin improved to 8.6 and has been stable ? ? ?Goals of care, counseling/discussion ?Seen by oncology and it was felt that he would not be a candidate for further systemic chemotherapy.  Recommendations were for palliative/hospice care ?Patient seen by palliative care service and discussed goals of care with patient and family ?They have elected to pursue hospice care with goal of therapy to be patient's comfort ?Wife is hopeful for residential hospice placement ? 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 ?Essential hypertension ?Chronically on ramipril and metoprolol as an outpatient ?Discontinued based on goals of care ? ?OSA on CPAP ?He had been refusing cpap ?-discontinued cpap based on goals of care ? ?CAD S/P percutaneous coronary angioplasty ?Remote history of stenting ?No chest pain at present ?We will avoid antiplatelets in light of GI bleeding ? ? ? ? ? ?  ? ?Subjective: sleeping on my arrival today. Noted overnight that he received multiple doses of IV morphine  and IV ativan. ? ?Physical Exam: ?Vitals:  ?  09/12/21 1000 09/12/21 1100 09/12/21 1200 09/12/21 2047  ?BP: 119/66 121/79 117/63   ?Pulse: 89 (!) 104 97   ?Resp: 16 (!) 23 14   ?Temp:    98.3 ?F (36.8 ?C)  ?TempSrc:    Axillary  ?SpO2: 97% 100% 97%   ?Weight:      ?Height:      ? ?General exam: sleeping ?Respiratory system: coarse breath sounds bilaterally. Respiratory effort normal. ?Cardiovascular system:regular tachycardic. No murmurs, rubs, gallops. ?Gastrointestinal system: Abdomen is nondistended, soft and nontender. No organomegaly or masses felt. Normal bowel sounds heard. ?Central nervous system: No focal neurological deficits. ?Extremities: No C/C/E, +pedal pulses ?Skin: No rashes, lesions or ulcers ?Psychiatry: somnolent ? ? ? ?Family Communication: Discussed with wife and sister at the bedside ? ?Disposition: ?Status is: Inpatient ?Remains inpatient appropriate because: management of agitation and confusion ? Planned Discharge Destination:  Residential hospice facility ? ? ? ?Time spent: 35 minutes ? ?Author: ?Kathie Dike, MD ?09/13/2021 3:31 PM ? ?For on call review [email or web address].  ?

## 2021-09-14 DIAGNOSIS — Z515 Encounter for palliative care: Secondary | ICD-10-CM

## 2021-09-14 DIAGNOSIS — G893 Neoplasm related pain (acute) (chronic): Secondary | ICD-10-CM

## 2021-09-14 MED ORDER — IPRATROPIUM-ALBUTEROL 0.5-2.5 (3) MG/3ML IN SOLN
3.0000 mL | RESPIRATORY_TRACT | Status: AC | PRN
Start: 2021-09-14 — End: ?

## 2021-09-14 MED ORDER — MORPHINE SULFATE (CONCENTRATE) 10 MG /0.5 ML PO SOLN: 10.0000 mg | ORAL | 0 refills | Status: AC | PRN

## 2021-09-14 MED ORDER — HALOPERIDOL 5 MG PO TABS: 5.0000 mg | ORAL_TABLET | Freq: Two times a day (BID) | ORAL | Status: AC

## 2021-09-14 MED ORDER — LORAZEPAM 1 MG PO TABS: 1.0000 mg | ORAL_TABLET | Freq: Four times a day (QID) | ORAL | 0 refills | Status: AC | PRN

## 2021-09-14 MED ORDER — PANTOPRAZOLE SODIUM 40 MG PO TBEC
40.0000 mg | DELAYED_RELEASE_TABLET | Freq: Two times a day (BID) | ORAL | 2 refills | Status: AC
Start: 2021-09-14 — End: ?

## 2021-09-14 NOTE — TOC Transition Note (Addendum)
Transition of Care (TOC) - CM/SW Discharge Note ? ? ?Patient Details  ?Name: Trevor Bailey ?MRN: [id number] ?Date of Birth: 10-21-1945 ? ?Transition of Care (TOC) CM/SW Contact:  ?Tawanna Cooler, RN ?Phone Number: ?09/14/2021, 1:57 PM ? ? ?Clinical Narrative:    ? ?Patient has been accepted at [facility] and has a bed available.  Called transport at 1345.  RN aware.  ?

## 2021-09-14 NOTE — Progress Notes (Signed)
Patient ID: ROCHESTER SERPE, male   DOB: January 25, 1946, 76 y.o.   MRN: [id number] ? ? ? ?Progress Note from the Palliative Medicine Team at [facility] ? ? ?Patient Name: Trevor Bailey        ?Date: 09/14/2021 ?DOB: May 24, 1945  Age: 76 y.o. MRN#: [id number] ?Attending Physician: Kathie Dike, MD ?Primary Care Physician: [name], MD ?Admit Date: 09/10/2021 ? ? ?Medical records reviewed  ? ?76 y.o. 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 Protonix twice daily  ? ?Admitted for treatment and stabilization.  EGD done.  ? ?  ?Patient is awake alert today resting in bed.  He denies any acute complaints.  He complains of generalized weakness and discomfort.  Patient is able to recall that he has cancer and that he was under the care of Dr. Earlie Server.   ? ?I discussed directly with the patient about his current condition and also introduced hospice philosophy of care.  We compared and contrasted residential hospice versus home with hospice.  Offered active listening and supportive presence and other compassionate therapeutic techniques at the time of this encounter so as to support the patient has that he handles this stage of his illness.   ? ?Plan of Care ?Continue comfort measures.  ?-DNR/DNI-   ?-No artificial feeding or hydration now or in the future ?-No further diagnostic, Lab draws finger sticks ?-Minimize medications utilizing those to enhance comfort only ?     Symptom management: ?            -Morphine 2 mg IV every 1 hour as needed for pain or dyspnea ?            -Ativan 1 mg IV every 4 hours as needed for agitation ?-   Patient's wife is hopeful for residential  hospice. Continue symptom management. Oral care.  ? ? ?Questions and concerns addressed: Discussed with Dr. Roderic Palau, recommend residential hospice. ? ? ?Loistine Chance MD ?Palliative Medicine Team Team Phone # [phone] ?  ?  ?

## 2021-09-14 NOTE — TOC Progression Note (Signed)
Transition of Care (TOC) - Progression Note  ? ? ?Patient Details  ?Name: Trevor Bailey ?MRN: [id number] ?Date of Birth: [birth year] ? ?Transition of Care (TOC) CM/SW Contact  ?Tawanna Cooler, RN ?Phone Number: ?09/14/2021, 10:24 AM ? ?Clinical Narrative:    ? ?Received a consult that patient's family wants residential hospice referral.  CM called [facility] rep and gave the referral.  Rep will review patient to determine if appropriate.   ?

## 2021-09-14 NOTE — Progress Notes (Signed)
[facility] ([facility]) Hospital Liaison note.     This patient is approved to transfer to [facility] today.    [facility] will notify TOC when registration paperwork has been completed to arrange transport.    RN please call report to [phone].   Thank you,     Mary Anne Robertson, RN, CCM       [facility] Hospital Liaison  [phone] 

## 2021-09-14 NOTE — Plan of Care (Signed)
?  Problem: Education: ?Goal: Knowledge of General Education information will improve ?Description: Including pain rating scale, medication(s)/side effects and non-pharmacologic comfort measures ?Outcome: Not Progressing ?  ? ? ? ?Problem: Health Behavior/Discharge Planning: ?Goal: Ability to manage health-related needs will improve ?Outcome: Progressing ?  ?Problem: Clinical Measurements: ?Goal: Ability to maintain clinical measurements within normal limits will improve ?Outcome: Progressing ?Goal: Will remain free from infection ?Outcome: Progressing ?Goal: Diagnostic test results will improve ?Outcome: Progressing ?Goal: Respiratory complications will improve ?Outcome: Progressing ?Goal: Cardiovascular complication will be avoided ?Outcome: Progressing ?  ?Problem: Activity: ?Goal: Risk for activity intolerance will decrease ?Outcome: Progressing ?  ?Problem: Nutrition: ?Goal: Adequate nutrition will be maintained ?Outcome: Progressing ?  ?Problem: Coping: ?Goal: Level of anxiety will decrease ?Outcome: Progressing ?  ?Problem: Elimination: ?Goal: Will not experience complications related to bowel motility ?Outcome: Progressing ?Goal: Will not experience complications related to urinary retention ?Outcome: Progressing ?  ?Problem: Pain Managment: ?Goal: General experience of comfort will improve ?Outcome: Progressing ?  ?Problem: Safety: ?Goal: Ability to remain free from injury will improve ?Outcome: Progressing ?  ?Problem: Skin Integrity: ?Goal: Risk for impaired skin integrity will decrease ?Outcome: Progressing ?  ?Problem: Education: ?Goal: Ability to identify signs and symptoms of gastrointestinal bleeding will improve ?Outcome: Progressing ?  ?Problem: Bowel/Gastric: ?Goal: Will show no signs and symptoms of gastrointestinal bleeding ?Outcome: Progressing ?  ?Problem: Fluid Volume: ?Goal: Will show no signs and symptoms of excessive bleeding ?Outcome: Progressing ?  ?Problem: Clinical Measurements: ?Goal:  Complications related to the disease process, condition or treatment will be avoided or minimized ?Outcome: Progressing ?  ?

## 2021-09-14 NOTE — Progress Notes (Signed)
?Progress Note ? ? ?Patient: Trevor Bailey WUJ:[id number] DOB: [birth year] DOA: 09/10/2021     3 ?DOS: the patient was seen and examined on 09/14/2021 ?  ?Brief hospital course: ?76 year old male with a history of non-small cell lung cancer stage IV, paroxysmal atrial fibrillation, recent admission for GI bleeding, readmitted to the hospital with worsening anemia secondary to GI bleed.  Seen by GI and underwent endoscopy where he was found that he had bleeding duodenal ulcer that was not amenable to endoscopic management.  Interventional radiology consulted for visceral arteriogram with embolization.  Palliative care following for goals of care. After discussing with oncology and palliative care, patient and family elected to transition to comfort care. Since then he has developed significant agitation and delirium. ? ?Assessment and Plan: ?* GI bleed ?Seen by GI and underwent EGD on 4/26 ?Patient found to have bleeding duodenal ulcer on EGD, not amenable to endoscopic management ?Interventional radiology consulted and performed visceral arteriogram with embolization ?Protonix has been changed to twice a day ? 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 Julien Nordmann ?Recent CT neck redemonstrated large left supraclavicular and left posterior neck malignancy involving bones, muscles and lymph nodes ?Not a candidate for further systemic chemotherapy ?Oncology recommendations to pursue palliative care/hospice ? ?Paroxysmal atrial fibrillation (HCC) ?Chronically on metoprolol for rate control ?Not on anticoagulation due to risk of bleeding, ongoing GI bleeding issues ? ? ? ?Delirium ?Patient had progressive paranoia and delusions.  ?Suspect that his change in mental status may be related to hospital delirium. No evidence of infection or other metabolic issues ?He has required multiple doses of ativan and haldol for agitation ?patient lacked insight to into his medical conditions,  was clearly delusional and lacked capacity to make informed decisions around his care/disposition ?Today he appears to be more calm and able to discuss his medical issues ?Continue scheduled haldol and as needed ativan ?Will continue monitor mental status for agitation ? ?Hypoalbuminemia ?Likely related to chronic disease/poor p.o. intake ?Nutrition consult ? ?Hyponatremia ?Mild, secondary to hypovolemia ?Improved with IV fluids ? ?AKI (acute kidney injury) ([place]) ?Related to severe anemia and hypovolemia ?Creatinine 1.6 on admission ?This trended down to 1.2 with volume resuscitation ? ?Acute on chronic blood loss anemia ?Secondary to GI blood loss ?Hgb was 11.2 on last discharge 4/25 ?Hgb down to 5.3 on readmission 4/25 ?S/p 2 unit prbc ?Follow up hemoglobin improved to 8.6 and has been stable ? ? ?Goals of care, counseling/discussion ?Seen by oncology and it was felt that he would not be a candidate for further systemic chemotherapy.  Recommendations were for palliative/hospice care ?Patient seen by palliative care service and discussed goals of care with patient and family ?They have elected to pursue hospice care with goal of therapy to be patient's comfort ?Wife is hopeful for residential hospice placement ?Discussed with Dr. Rowe Pavy who also agrees with residential hospice placement ? 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 ?Essential hypertension ?Chronically on ramipril and metoprolol as an outpatient ?Discontinued based on goals of care ? ?OSA on CPAP ?He had been refusing cpap ?-discontinued cpap based on goals of care ? ?CAD S/P percutaneous coronary angioplasty ?Remote history of stenting ?No chest pain at present ?We will avoid antiplatelets in light of GI bleeding ? ? ? ? ? ?  ? ?  Subjective: he is awake, seems more calm today. Denies any  pain at this time ? ?Physical Exam: ?Vitals:  ? 09/12/21 1200 09/12/21 2047 09/14/21 0631 09/14/21 0742  ?BP: 117/63  115/73 110/69  ?Pulse: 97  (!) 110 (!) 111  ?Resp: 14  19 18   ?Temp:  98.3 ?F (36.8 ?C) (!) 97.4 ?F (36.3 ?C) 97.7 ?F (36.5 ?C)  ?TempSrc:  Axillary Oral Oral  ?SpO2: 97%  95% 97%  ?Weight:      ?Height:      ? ?General exam: Alert, awake, no distress ?Respiratory system: bilateral rhonchi. Respiratory effort normal. ?Cardiovascular system:RRR. No murmurs, rubs, gallops. ?Gastrointestinal system: Abdomen is nondistended, soft and nontender. No organomegaly or masses felt. Normal bowel sounds heard. ?Central nervous system:  No focal neurological deficits. ?Extremities: No C/C/E, +pedal pulses ?Skin: No rashes, lesions or ulcers ?Psychiatry: more calm today, still has some mild confusion, but overall much more coherent  ? ? ? ? ?Family Communication: Discussed with wife over the phone ? ?Disposition: ?Status is: Inpatient ?Remains inpatient appropriate because: management of agitation and confusion ? Planned Discharge Destination:  Residential hospice facility ? ? ? ?Time spent: 35 minutes ? ?Author: ?Kathie Dike, MD ?09/14/2021 11:04 AM ? ?For on call review [email or web address]  ?

## 2021-09-14 NOTE — Discharge Summary (Signed)
Physician Discharge Summary  ?Trevor Bailey Olexa WGY:[id number] DOB: 1946/03/07 DOA: 09/10/2021 ? ?PCP: [name], MD ? ?Admit date: 09/10/2021 ?Discharge date: 09/14/2021 ? ?Admitted From: home ?Disposition:  residential hospice facility ? ?Recommendations for Outpatient Follow-up:  ?Patient to be discharged to residential hospice facility ? ?Discharge Condition:stable ?CODE STATUS:DNR, comfort measures ?Diet recommendation: regular diet for comfort ? ?Brief/Interim Summary: ?76 year old male with a history of non-small cell lung cancer stage IV, paroxysmal atrial fibrillation, recent admission for GI bleeding, readmitted to the hospital with worsening anemia secondary to GI bleed.  Seen by GI and underwent endoscopy where he was found that he had bleeding duodenal ulcer that was not amenable to endoscopic management.  Interventional radiology consulted for visceral arteriogram with embolization.  Palliative care following for goals of care. After discussing with oncology and palliative care, patient and family elected to transition to comfort care. Since then he has developed significant agitation and delirium. Started on haldol and prn ativan with improvement in mental status. He has been accepted to residential hospice ? ?Discharge Diagnoses:  ?Principal Problem: ?  GI bleed ?Active Problems: ?  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  Paroxysmal atrial fibrillation (HCC) ?  CAD S/P percutaneous coronary angioplasty ?  OSA on CPAP ?  Essential hypertension ?  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  Goals of care, counseling/discussion ?  Acute on chronic blood loss anemia ?  AKI (acute kidney injury) ([place]) ?  Hyponatremia ?  Thrombocytosis ?  Hypoalbuminemia ?  Delirium ?  Hospice care patient ?  Cancer associated pain ? ?GI bleed ?Seen by GI and underwent EGD on 4/26 ?Patient found to have bleeding duodenal ulcer on EGD, not amenable to endoscopic  management ?Interventional radiology consulted and performed visceral arteriogram with embolization ?Protonix has been changed to twice a day ?  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 Julien Nordmann ?Recent CT neck redemonstrated large left supraclavicular and left posterior neck malignancy involving bones, muscles and lymph nodes ?Not a candidate for further systemic chemotherapy ?Oncology recommendations to pursue palliative care/hospice ?  ?Paroxysmal atrial fibrillation (HCC) ?He was chronically on metoprolol for rate control ?Not on anticoagulation due to risk of bleeding, ongoing GI bleeding issues ?  ?   ?Delirium ?Patient had progressive paranoia and delusions.  ?Suspect that his change in mental status may be related to hospital delirium. No evidence of infection or other metabolic issues ?He has required multiple doses of ativan and haldol for agitation ?patient lacked insight to into his medical conditions, was clearly delusional and lacked capacity to make informed decisions around his care/disposition ?Today he appears to be more calm and able to discuss his medical issues ?Agrees with residential hospice ?Continue scheduled haldol and as needed ativan ?Will continue monitor mental status for agitation ?  ?Hypoalbuminemia ?Likely related to chronic disease/poor p.o. intake ?Nutrition consulted ?  ?Hyponatremia ?Mild, secondary to hypovolemia ?Improved with IV fluids ?  ?AKI (acute kidney injury) ([place]) ?Related to severe anemia and hypovolemia ?Creatinine 1.6 on admission ?This trended down to 1.2 with volume resuscitation ?  ?Acute on chronic blood loss anemia ?Secondary to GI blood loss ?Hgb was 11.2 on last discharge 4/25 ?Hgb down to 5.3 on readmission 4/25 ?S/p 2 unit prbc ?Follow up hemoglobin improved to 8.6 and has been stable ?  ?  ?Goals of care, counseling/discussion ?Seen by oncology and it was felt  that he would not be a candidate for further  systemic chemotherapy.  Recommendations were for palliative/hospice care ?Patient seen by palliative care service and discussed goals of care with patient and family ?They have elected to pursue hospice care with goal of therapy to be patient's comfort ?Wife is hopeful for residential hospice placement ?Discussed with Dr. Rowe Pavy who also agrees with residential hospice placement ?  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  ?Essential hypertension ?Chronically on ramipril and metoprolol as an outpatient ?Discontinued based on goals of care ?  ?OSA on CPAP ?He had been refusing cpap ?-discontinued cpap based on goals of care ?  ?CAD S/P percutaneous coronary angioplasty ?Remote history of stenting ?No chest pain at present ?We will avoid antiplatelets in light of GI bleeding ? ?Discharge Instructions ? ?Discharge Instructions   ? ? Diet - low sodium heart healthy   Complete by: As directed ?  ? Diet general   Complete by: As directed ?  ? Increase activity slowly   Complete by: As directed ?  ? No wound care   Complete by: As directed ?  ? ?  ? ?Allergies as of 09/14/2021   ?No Known Allergies ?  ? ?  ?Medication List  ?  ? ?STOP taking these medications   ? ?atorvastatin 80 MG tablet ?Commonly known as: LIPITOR ?  ?ezetimibe 10 MG tablet ?Commonly known as: ZETIA ?  ?folic acid 1 MG tablet ?Commonly known as: FOLVITE ?  ?latanoprost 0.005 % ophthalmic solution ?Commonly known as: XALATAN ?  ?metFORMIN 1000 MG tablet ?Commonly known as: GLUCOPHAGE ?  ?metoprolol tartrate 25 MG tablet ?Commonly known as: LOPRESSOR ?  ?oxyCODONE 5 MG immediate release tablet ?Commonly known as: Oxy IR/ROXICODONE ?  ?traMADol 50 MG tablet ?Commonly known as: ULTRAM ?  ? ?  ? ?TAKE these medications   ? ?haloperidol 5 MG tablet ?Commonly known as: HALDOL ?Take 1 tablet (5 mg  total) by mouth 2 (two) times daily. ?  ?ipratropium-albuterol 0.5-2.5 (3) MG/3ML Soln ?Commonly known as: DUONEB ?Take 3 mLs by nebulization every 4 (four) hours as needed. ?  ?LORazepam 1 MG tablet ?Commonly known as: Ativan ?Place 1 tablet (1 mg total) under the tongue every 6 (six) hours as needed for anxiety. ?  ?morphine CONCENTRATE 10 mg / 0.5 ml concentrated solution ?Take 0.5 mLs (10 mg total) by mouth every 2 (two) hours as needed for severe pain. ?  ?ondansetron 4 MG tablet ?Commonly known as: ZOFRAN ?Take 1 tablet (4 mg total) by mouth every 6 (six) hours as needed for nausea. ?  ?pantoprazole 40 MG tablet ?Commonly known as: PROTONIX ?Take 1 tablet (40 mg total) by mouth 2 (two) times daily before a meal. ?What changed: when to take this ?  ? ?  ? ? ?No Known Allergies ? ?Consultations: ?Gastroenterology ?Interventional radiology ?Palliative care ?oncology ? ? ?Procedures/Studies: ?CT Head Wo Contrast ? ?Result Date: 08/25/2021 ?CLINICAL DATA:  Altered mental status EXAM: CT HEAD WITHOUT CONTRAST TECHNIQUE: Contiguous axial images were obtained from the base of the skull through the vertex without intravenous contrast. RADIATION DOSE REDUCTION: This exam was performed according to the departmental dose-optimization program which includes automated exposure control, adjustment of the mA and/or kV according to patient size and/or use of iterative reconstruction technique. COMPARISON:  08/10/2009 FINDINGS: Brain: No evidence of  acute infarction, hemorrhage, hydrocephalus, extra-axial collection or mass lesion/mass effect. Chronic white matter ischemic changes are noted. Vascular: No hyperdense vessel or unexpected calcification. Skull: Normal. Negative for fracture or focal lesion. Sinuses/Orbits: No acute finding. Other: None. IMPRESSION: Chronic white matter ischemic changes without acute intracranial abnormality. Electronically Signed   By: Inez Catalina M.D.   On: 08/25/2021 02:03  ? ?CT SOFT TISSUE NECK  WO CONTRAST ? ?Result Date: 09/02/2021 ?CLINICAL DATA:  Pain in the left neck extending to the shoulder, area of radiation changes and exudative drainage, concerning for infection EXAM: CT NECK WITHOUT CONTRAST TECHN

## 2021-09-14 NOTE — Plan of Care (Signed)
Patient ID: RIGGINS CISEK, male   DOB: Jun 10, 1945, 76 y.o.   MRN: [id number] ? ?Problem: Education: ?Goal: Knowledge of General Education information will improve ?Description: Including pain rating scale, medication(s)/side effects and non-pharmacologic comfort measures ?Outcome: Adequate for Discharge ?  ?Problem: Health Behavior/Discharge Planning: ?Goal: Ability to manage health-related needs will improve ?Outcome: Adequate for Discharge ?  ?Problem: Clinical Measurements: ?Goal: Ability to maintain clinical measurements within normal limits will improve ?Outcome: Adequate for Discharge ?Goal: Will remain free from infection ?Outcome: Adequate for Discharge ?Goal: Diagnostic test results will improve ?Outcome: Adequate for Discharge ?Goal: Respiratory complications will improve ?Outcome: Adequate for Discharge ?Goal: Cardiovascular complication will be avoided ?Outcome: Adequate for Discharge ?  ?Problem: Activity: ?Goal: Risk for activity intolerance will decrease ?Outcome: Adequate for Discharge ?  ?Problem: Nutrition: ?Goal: Adequate nutrition will be maintained ?Outcome: Adequate for Discharge ?  ?Problem: Coping: ?Goal: Level of anxiety will decrease ?Outcome: Adequate for Discharge ?  ?Problem: Elimination: ?Goal: Will not experience complications related to bowel motility ?Outcome: Adequate for Discharge ?Goal: Will not experience complications related to urinary retention ?Outcome: Adequate for Discharge ?  ?Problem: Pain Managment: ?Goal: General experience of comfort will improve ?Outcome: Adequate for Discharge ?  ?Problem: Safety: ?Goal: Ability to remain free from injury will improve ?Outcome: Adequate for Discharge ?  ?Problem: Skin Integrity: ?Goal: Risk for impaired skin integrity will decrease ?Outcome: Adequate for Discharge ?  ?Problem: Education: ?Goal: Ability to identify signs and symptoms of gastrointestinal bleeding will improve ?Outcome: Adequate for Discharge ?  ?Problem:  Bowel/Gastric: ?Goal: Will show no signs and symptoms of gastrointestinal bleeding ?Outcome: Adequate for Discharge ?  ?Problem: Fluid Volume: ?Goal: Will show no signs and symptoms of excessive bleeding ?Outcome: Adequate for Discharge ?  ?Problem: Clinical Measurements: ?Goal: Complications related to the disease process, condition or treatment will be avoided or minimized ?Outcome: Adequate for Discharge ? Haydee Salter, RN ? ?

## 2021-09-18 ENCOUNTER — Encounter (HOSPITAL_COMMUNITY): Payer: Self-pay | Admitting: Radiology

## 2021-09-23 ENCOUNTER — Encounter: Payer: Self-pay | Admitting: Internal Medicine

## 2021-09-23 NOTE — Progress Notes (Signed)
? ?                                                                                                                                                          ?  Patient Name: Trevor Bailey ?MRN: [id number] ?DOB: 23-Jan-1946 ?Referring Physician: POLITE RONALD (Profile Not Attached) ?Date of Service: 09/02/2021 ?[facility], [place] ? ?                                                      End Of Treatment Note ? 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 ? ?Intent: Palliative ? ?Radiation Treatment Dates: 08/14/2021 through 09/02/2021 ?Site Technique Total Dose (Gy) Dose per Fx (Gy) Completed Fx Beam Energies  ?Neck Left: HN_L IMRT 30/30 2.5 12/12 6X  ? ?Narrative: The patient tolerated radiation therapy relatively well. He did complain of esophagitis during radiation. ? ?Plan: The patient will receive a call in about one month from the radiation oncology department. He will continue follow up with Dr. Julien Nordmann as well.  ? ?________________________________________________ ? ? ? ?Carola Rhine, PAC  ?

## 2021-10-01 ENCOUNTER — Telehealth: Payer: Self-pay | Admitting: Internal Medicine

## 2021-10-01 NOTE — Telephone Encounter (Signed)
Rescheduled 06/22 appointment to 06/23 due to provider pal, patient has been called and voicemail was left. ?

## 2021-10-07 ENCOUNTER — Ambulatory Visit: Admit: 2021-10-07 | Payer: Medicare Other

## 2021-10-16 ENCOUNTER — Encounter: Payer: Self-pay | Admitting: Internal Medicine

## 2021-10-17 DEATH — deceased

## 2021-10-21 ENCOUNTER — Telehealth: Payer: Self-pay | Admitting: Cardiovascular Disease

## 2021-10-21 ENCOUNTER — Telehealth: Payer: Self-pay | Admitting: Internal Medicine

## 2021-10-21 NOTE — Telephone Encounter (Signed)
New Message:   Wife called and wanted you to know patient passed away on 2021-09-24.

## 2021-10-21 NOTE — Telephone Encounter (Signed)
Called patient regarding upcoming appointment, left a voicemail. 

## 2021-11-05 ENCOUNTER — Other Ambulatory Visit: Payer: Medicare Other

## 2021-11-07 ENCOUNTER — Ambulatory Visit: Payer: Medicare Other | Admitting: Internal Medicine

## 2021-11-08 ENCOUNTER — Other Ambulatory Visit: Payer: Medicare Other

## 2021-11-11 ENCOUNTER — Ambulatory Visit: Payer: Medicare Other | Admitting: Internal Medicine

## 2023-03-02 NOTE — Telephone Encounter (Signed)
error 

## 2023-04-08 NOTE — Telephone Encounter (Signed)
Telephone call
